# Patient Record
Sex: Male | Born: 1955 | Race: White | Hispanic: No | Marital: Married | Smoking: Former smoker
Health system: Southern US, Community
[De-identification: ages and names within clinical notes are randomized; demographics above are authoritative.]

## PROBLEM LIST (undated history)

## (undated) DIAGNOSIS — K219 Gastro-esophageal reflux disease without esophagitis: Secondary | ICD-10-CM

## (undated) DIAGNOSIS — N189 Chronic kidney disease, unspecified: Secondary | ICD-10-CM

## (undated) DIAGNOSIS — N032 Chronic nephritic syndrome with diffuse membranous glomerulonephritis: Secondary | ICD-10-CM

## (undated) DIAGNOSIS — F419 Anxiety disorder, unspecified: Secondary | ICD-10-CM

## (undated) DIAGNOSIS — I5032 Chronic diastolic (congestive) heart failure: Secondary | ICD-10-CM

## (undated) DIAGNOSIS — I77819 Aortic ectasia, unspecified site: Secondary | ICD-10-CM

## (undated) DIAGNOSIS — R05 Cough: Secondary | ICD-10-CM

## (undated) DIAGNOSIS — C801 Malignant (primary) neoplasm, unspecified: Secondary | ICD-10-CM

## (undated) DIAGNOSIS — R058 Other specified cough: Secondary | ICD-10-CM

## (undated) DIAGNOSIS — H906 Mixed conductive and sensorineural hearing loss, bilateral: Secondary | ICD-10-CM

## (undated) DIAGNOSIS — Z9289 Personal history of other medical treatment: Secondary | ICD-10-CM

## (undated) DIAGNOSIS — M109 Gout, unspecified: Secondary | ICD-10-CM

## (undated) DIAGNOSIS — K5790 Diverticulosis of intestine, part unspecified, without perforation or abscess without bleeding: Secondary | ICD-10-CM

## (undated) DIAGNOSIS — E785 Hyperlipidemia, unspecified: Secondary | ICD-10-CM

## (undated) DIAGNOSIS — K449 Diaphragmatic hernia without obstruction or gangrene: Secondary | ICD-10-CM

## (undated) DIAGNOSIS — I714 Abdominal aortic aneurysm, without rupture: Secondary | ICD-10-CM

## (undated) DIAGNOSIS — Z923 Personal history of irradiation: Secondary | ICD-10-CM

## (undated) DIAGNOSIS — I10 Essential (primary) hypertension: Secondary | ICD-10-CM

## (undated) DIAGNOSIS — G43909 Migraine, unspecified, not intractable, without status migrainosus: Secondary | ICD-10-CM

## (undated) DIAGNOSIS — I519 Heart disease, unspecified: Secondary | ICD-10-CM

## (undated) DIAGNOSIS — F1021 Alcohol dependence, in remission: Secondary | ICD-10-CM

## (undated) DIAGNOSIS — G4730 Sleep apnea, unspecified: Secondary | ICD-10-CM

## (undated) DIAGNOSIS — I2510 Atherosclerotic heart disease of native coronary artery without angina pectoris: Secondary | ICD-10-CM

## (undated) DIAGNOSIS — I739 Peripheral vascular disease, unspecified: Secondary | ICD-10-CM

## (undated) DIAGNOSIS — F341 Dysthymic disorder: Secondary | ICD-10-CM

## (undated) DIAGNOSIS — K222 Esophageal obstruction: Secondary | ICD-10-CM

## (undated) HISTORY — DX: Gout, unspecified: M10.9

## (undated) HISTORY — DX: Personal history of other medical treatment: Z92.89

## (undated) HISTORY — DX: Diverticulosis of intestine, part unspecified, without perforation or abscess without bleeding: K57.90

## (undated) HISTORY — DX: Peripheral vascular disease, unspecified: I73.9

## (undated) HISTORY — DX: Abdominal aortic aneurysm, without rupture: I71.4

## (undated) HISTORY — DX: Chronic diastolic (congestive) heart failure: I50.32

## (undated) HISTORY — PX: COLONOSCOPY: SHX174

## (undated) HISTORY — DX: Heart disease, unspecified: I51.9

## (undated) HISTORY — DX: Aortic ectasia, unspecified site: I77.819

## (undated) HISTORY — DX: Migraine, unspecified, not intractable, without status migrainosus: G43.909

## (undated) HISTORY — PX: APPENDECTOMY: SHX54

## (undated) HISTORY — DX: Atherosclerotic heart disease of native coronary artery without angina pectoris: I25.10

## (undated) HISTORY — DX: Malignant (primary) neoplasm, unspecified: C80.1

## (undated) HISTORY — DX: Anxiety disorder, unspecified: F41.9

## (undated) HISTORY — DX: Personal history of irradiation: Z92.3

## (undated) HISTORY — PX: CORONARY STENT PLACEMENT: SHX1402

---

## 1995-03-02 HISTORY — PX: BACK SURGERY: SHX140

## 1997-07-23 ENCOUNTER — Encounter: Payer: Self-pay | Admitting: Family Medicine

## 1997-07-23 LAB — CONVERTED CEMR LAB
Blood Glucose, Fasting: 94 mg/dL
RBC count: 4.92 10*6/uL
TSH: 2.09 microintl units/mL
WBC, blood: 5.8 10*3/uL

## 1997-09-26 ENCOUNTER — Encounter: Admission: RE | Admit: 1997-09-26 | Discharge: 1997-09-26 | Payer: Self-pay | Admitting: *Deleted

## 1998-08-03 ENCOUNTER — Emergency Department (HOSPITAL_COMMUNITY): Admission: EM | Admit: 1998-08-03 | Discharge: 1998-08-03 | Payer: Self-pay | Admitting: Emergency Medicine

## 1998-08-05 ENCOUNTER — Encounter: Admission: RE | Admit: 1998-08-05 | Discharge: 1998-08-05 | Payer: Self-pay | Admitting: *Deleted

## 2001-04-15 ENCOUNTER — Emergency Department (HOSPITAL_COMMUNITY): Admission: EM | Admit: 2001-04-15 | Discharge: 2001-04-15 | Payer: Self-pay | Admitting: Emergency Medicine

## 2002-08-24 ENCOUNTER — Encounter: Payer: Self-pay | Admitting: Emergency Medicine

## 2002-08-24 ENCOUNTER — Emergency Department (HOSPITAL_COMMUNITY): Admission: EM | Admit: 2002-08-24 | Discharge: 2002-08-24 | Payer: Self-pay | Admitting: Emergency Medicine

## 2002-10-10 ENCOUNTER — Encounter: Payer: Self-pay | Admitting: Family Medicine

## 2002-10-10 LAB — CONVERTED CEMR LAB: Hgb A1c MFr Bld: 5.6 %

## 2002-10-11 ENCOUNTER — Encounter: Payer: Self-pay | Admitting: Family Medicine

## 2002-10-11 LAB — CONVERTED CEMR LAB
Blood Glucose, Fasting: 98 mg/dL
RBC count: 5.44 10*6/uL
TSH: 1.67 microintl units/mL
WBC, blood: 6.9 10*3/uL

## 2002-12-16 ENCOUNTER — Ambulatory Visit (HOSPITAL_BASED_OUTPATIENT_CLINIC_OR_DEPARTMENT_OTHER): Admission: RE | Admit: 2002-12-16 | Discharge: 2002-12-16 | Payer: Self-pay | Admitting: Family Medicine

## 2003-01-30 ENCOUNTER — Encounter: Admission: RE | Admit: 2003-01-30 | Discharge: 2003-01-30 | Payer: Self-pay | Admitting: Nephrology

## 2003-02-08 ENCOUNTER — Ambulatory Visit (HOSPITAL_BASED_OUTPATIENT_CLINIC_OR_DEPARTMENT_OTHER): Admission: RE | Admit: 2003-02-08 | Discharge: 2003-02-08 | Payer: Self-pay | Admitting: Pulmonary Disease

## 2003-03-27 ENCOUNTER — Ambulatory Visit (HOSPITAL_COMMUNITY): Admission: RE | Admit: 2003-03-27 | Discharge: 2003-03-27 | Payer: Self-pay | Admitting: Nephrology

## 2003-03-29 ENCOUNTER — Observation Stay (HOSPITAL_COMMUNITY): Admission: RE | Admit: 2003-03-29 | Discharge: 2003-03-30 | Payer: Self-pay | Admitting: Nephrology

## 2003-03-29 ENCOUNTER — Encounter (INDEPENDENT_AMBULATORY_CARE_PROVIDER_SITE_OTHER): Payer: Self-pay | Admitting: *Deleted

## 2003-04-26 ENCOUNTER — Ambulatory Visit (HOSPITAL_COMMUNITY): Admission: RE | Admit: 2003-04-26 | Discharge: 2003-04-26 | Payer: Self-pay | Admitting: Internal Medicine

## 2003-05-12 ENCOUNTER — Emergency Department (HOSPITAL_COMMUNITY): Admission: EM | Admit: 2003-05-12 | Discharge: 2003-05-12 | Payer: Self-pay | Admitting: Emergency Medicine

## 2004-01-06 ENCOUNTER — Encounter: Payer: Self-pay | Admitting: Family Medicine

## 2004-01-06 LAB — CONVERTED CEMR LAB: Blood Glucose, Fasting: 87 mg/dL

## 2004-05-22 ENCOUNTER — Encounter: Payer: Self-pay | Admitting: Family Medicine

## 2004-05-22 LAB — CONVERTED CEMR LAB: Blood Glucose, Fasting: 96 mg/dL

## 2004-06-03 ENCOUNTER — Ambulatory Visit: Payer: Self-pay | Admitting: Family Medicine

## 2004-07-17 ENCOUNTER — Ambulatory Visit: Payer: Self-pay | Admitting: Family Medicine

## 2004-09-03 ENCOUNTER — Encounter: Payer: Self-pay | Admitting: Family Medicine

## 2004-09-03 LAB — CONVERTED CEMR LAB: Blood Glucose, Fasting: 95 mg/dL

## 2005-01-06 ENCOUNTER — Ambulatory Visit: Payer: Self-pay | Admitting: Family Medicine

## 2005-01-06 LAB — CONVERTED CEMR LAB
Blood Glucose, Fasting: 101 mg/dL
PSA: 0.59 ng/mL
TSH: 2.5 microintl units/mL

## 2005-01-08 ENCOUNTER — Ambulatory Visit: Payer: Self-pay | Admitting: Family Medicine

## 2005-02-11 ENCOUNTER — Ambulatory Visit: Payer: Self-pay | Admitting: Family Medicine

## 2005-07-16 ENCOUNTER — Ambulatory Visit: Payer: Self-pay | Admitting: Family Medicine

## 2006-07-23 ENCOUNTER — Emergency Department (HOSPITAL_COMMUNITY): Admission: EM | Admit: 2006-07-23 | Discharge: 2006-07-23 | Payer: Self-pay | Admitting: Emergency Medicine

## 2006-09-20 ENCOUNTER — Emergency Department (HOSPITAL_COMMUNITY): Admission: EM | Admit: 2006-09-20 | Discharge: 2006-09-20 | Payer: Self-pay | Admitting: Emergency Medicine

## 2007-05-04 ENCOUNTER — Ambulatory Visit: Payer: Self-pay | Admitting: Family Medicine

## 2007-05-10 ENCOUNTER — Encounter: Payer: Self-pay | Admitting: Family Medicine

## 2007-05-10 DIAGNOSIS — G4730 Sleep apnea, unspecified: Secondary | ICD-10-CM | POA: Insufficient documentation

## 2007-05-10 DIAGNOSIS — K219 Gastro-esophageal reflux disease without esophagitis: Secondary | ICD-10-CM

## 2007-05-10 DIAGNOSIS — N032 Chronic nephritic syndrome with diffuse membranous glomerulonephritis: Secondary | ICD-10-CM

## 2007-05-10 DIAGNOSIS — H906 Mixed conductive and sensorineural hearing loss, bilateral: Secondary | ICD-10-CM

## 2007-05-10 DIAGNOSIS — F17200 Nicotine dependence, unspecified, uncomplicated: Secondary | ICD-10-CM | POA: Insufficient documentation

## 2007-05-10 DIAGNOSIS — K222 Esophageal obstruction: Secondary | ICD-10-CM

## 2007-05-10 DIAGNOSIS — E785 Hyperlipidemia, unspecified: Secondary | ICD-10-CM

## 2007-05-10 DIAGNOSIS — K449 Diaphragmatic hernia without obstruction or gangrene: Secondary | ICD-10-CM

## 2007-05-10 DIAGNOSIS — F341 Dysthymic disorder: Secondary | ICD-10-CM

## 2007-05-10 DIAGNOSIS — I10 Essential (primary) hypertension: Secondary | ICD-10-CM | POA: Insufficient documentation

## 2007-05-10 DIAGNOSIS — K589 Irritable bowel syndrome without diarrhea: Secondary | ICD-10-CM

## 2007-05-10 DIAGNOSIS — R7309 Other abnormal glucose: Secondary | ICD-10-CM

## 2007-05-10 DIAGNOSIS — F1021 Alcohol dependence, in remission: Secondary | ICD-10-CM | POA: Insufficient documentation

## 2007-05-10 HISTORY — DX: Diaphragmatic hernia without obstruction or gangrene: K44.9

## 2007-05-10 HISTORY — DX: Mixed conductive and sensorineural hearing loss, bilateral: H90.6

## 2007-05-10 HISTORY — DX: Sleep apnea, unspecified: G47.30

## 2007-05-10 HISTORY — DX: Dysthymic disorder: F34.1

## 2007-05-10 HISTORY — DX: Hyperlipidemia, unspecified: E78.5

## 2007-05-10 HISTORY — DX: Essential (primary) hypertension: I10

## 2007-05-10 HISTORY — DX: Gastro-esophageal reflux disease without esophagitis: K21.9

## 2007-05-10 HISTORY — DX: Chronic nephritic syndrome with diffuse membranous glomerulonephritis: N03.2

## 2007-05-10 HISTORY — DX: Esophageal obstruction: K22.2

## 2007-05-15 ENCOUNTER — Ambulatory Visit: Payer: Self-pay | Admitting: Family Medicine

## 2007-05-15 DIAGNOSIS — M25539 Pain in unspecified wrist: Secondary | ICD-10-CM | POA: Insufficient documentation

## 2007-05-23 ENCOUNTER — Ambulatory Visit: Payer: Self-pay | Admitting: Family Medicine

## 2008-07-01 ENCOUNTER — Encounter: Payer: Self-pay | Admitting: Family Medicine

## 2008-08-02 ENCOUNTER — Encounter: Payer: Self-pay | Admitting: Family Medicine

## 2009-06-10 ENCOUNTER — Ambulatory Visit: Payer: Self-pay | Admitting: Family Medicine

## 2009-06-10 LAB — CONVERTED CEMR LAB
BUN: 12 mg/dL (ref 6–23)
CO2: 27 meq/L (ref 19–32)
Calcium: 9.1 mg/dL (ref 8.4–10.5)
Chloride: 105 meq/L (ref 96–112)
Creatinine, Ser: 1 mg/dL (ref 0.4–1.5)
GFR calc non Af Amer: 82.46 mL/min (ref >60)
Glucose, Bld: 89 mg/dL (ref 70–99)
Potassium: 4.5 meq/L (ref 3.5–5.1)
Sodium: 140 meq/L (ref 135–145)

## 2009-06-30 ENCOUNTER — Encounter (INDEPENDENT_AMBULATORY_CARE_PROVIDER_SITE_OTHER): Payer: Self-pay | Admitting: *Deleted

## 2009-08-28 ENCOUNTER — Encounter: Payer: Self-pay | Admitting: Family Medicine

## 2009-10-06 ENCOUNTER — Encounter (INDEPENDENT_AMBULATORY_CARE_PROVIDER_SITE_OTHER): Payer: Self-pay | Admitting: *Deleted

## 2010-02-02 ENCOUNTER — Encounter: Payer: Self-pay | Admitting: Family Medicine

## 2010-02-05 ENCOUNTER — Ambulatory Visit: Payer: Self-pay | Admitting: Family Medicine

## 2010-02-05 DIAGNOSIS — R0609 Other forms of dyspnea: Secondary | ICD-10-CM

## 2010-02-05 DIAGNOSIS — R0989 Other specified symptoms and signs involving the circulatory and respiratory systems: Secondary | ICD-10-CM

## 2010-02-19 ENCOUNTER — Ambulatory Visit: Payer: Self-pay | Admitting: Internal Medicine

## 2010-02-19 DIAGNOSIS — R635 Abnormal weight gain: Secondary | ICD-10-CM | POA: Insufficient documentation

## 2010-02-19 DIAGNOSIS — R05 Cough: Secondary | ICD-10-CM

## 2010-03-01 DIAGNOSIS — C801 Malignant (primary) neoplasm, unspecified: Secondary | ICD-10-CM

## 2010-03-01 HISTORY — DX: Malignant (primary) neoplasm, unspecified: C80.1

## 2010-03-01 HISTORY — PX: LARYNGOSCOPY / BRONCHOSCOPY / ESOPHAGOSCOPY: SUR818

## 2010-03-06 ENCOUNTER — Ambulatory Visit
Admission: RE | Admit: 2010-03-06 | Discharge: 2010-03-06 | Payer: Self-pay | Source: Home / Self Care | Attending: Internal Medicine | Admitting: Internal Medicine

## 2010-03-09 ENCOUNTER — Encounter: Payer: Self-pay | Admitting: Internal Medicine

## 2010-03-11 ENCOUNTER — Telehealth (INDEPENDENT_AMBULATORY_CARE_PROVIDER_SITE_OTHER): Payer: Self-pay | Admitting: *Deleted

## 2010-03-17 ENCOUNTER — Ambulatory Visit
Admission: RE | Admit: 2010-03-17 | Discharge: 2010-03-17 | Payer: Self-pay | Source: Home / Self Care | Attending: Internal Medicine | Admitting: Internal Medicine

## 2010-03-17 ENCOUNTER — Encounter: Payer: Self-pay | Admitting: Internal Medicine

## 2010-03-19 ENCOUNTER — Ambulatory Visit
Admission: RE | Admit: 2010-03-19 | Discharge: 2010-03-19 | Payer: Self-pay | Source: Home / Self Care | Attending: Internal Medicine | Admitting: Internal Medicine

## 2010-03-24 ENCOUNTER — Telehealth (INDEPENDENT_AMBULATORY_CARE_PROVIDER_SITE_OTHER): Payer: Self-pay | Admitting: *Deleted

## 2010-03-25 ENCOUNTER — Encounter: Payer: Self-pay | Admitting: Internal Medicine

## 2010-03-27 ENCOUNTER — Ambulatory Visit
Admission: RE | Admit: 2010-03-27 | Discharge: 2010-03-31 | Payer: Self-pay | Source: Home / Self Care | Attending: Radiation Oncology | Admitting: Radiation Oncology

## 2010-03-30 ENCOUNTER — Encounter: Payer: Self-pay | Admitting: Family Medicine

## 2010-04-01 ENCOUNTER — Other Ambulatory Visit: Payer: Self-pay | Admitting: Otolaryngology

## 2010-04-01 ENCOUNTER — Ambulatory Visit (HOSPITAL_COMMUNITY)
Admission: RE | Admit: 2010-04-01 | Discharge: 2010-04-02 | Disposition: A | Discharge status: Home / Self Care | Payer: Medicaid Other | Attending: Otolaryngology | Admitting: Otolaryngology

## 2010-04-01 ENCOUNTER — Ambulatory Visit: Payer: Self-pay | Admitting: Radiation Oncology

## 2010-04-01 DIAGNOSIS — Z01812 Encounter for preprocedural laboratory examination: Secondary | ICD-10-CM | POA: Insufficient documentation

## 2010-04-01 DIAGNOSIS — C321 Malignant neoplasm of supraglottis: Secondary | ICD-10-CM | POA: Insufficient documentation

## 2010-04-01 DIAGNOSIS — Z87891 Personal history of nicotine dependence: Secondary | ICD-10-CM | POA: Insufficient documentation

## 2010-04-01 DIAGNOSIS — Z23 Encounter for immunization: Secondary | ICD-10-CM | POA: Insufficient documentation

## 2010-04-01 DIAGNOSIS — J449 Chronic obstructive pulmonary disease, unspecified: Secondary | ICD-10-CM | POA: Insufficient documentation

## 2010-04-01 DIAGNOSIS — J4489 Other specified chronic obstructive pulmonary disease: Secondary | ICD-10-CM | POA: Insufficient documentation

## 2010-04-01 DIAGNOSIS — I10 Essential (primary) hypertension: Secondary | ICD-10-CM | POA: Insufficient documentation

## 2010-04-01 DIAGNOSIS — K219 Gastro-esophageal reflux disease without esophagitis: Secondary | ICD-10-CM | POA: Insufficient documentation

## 2010-04-01 LAB — CBC
HCT: 39.4 % (ref 39.0–52.0)
Hemoglobin: 13.6 g/dL (ref 13.0–17.0)
MCH: 31.9 pg (ref 26.0–34.0)
MCHC: 34.5 g/dL (ref 30.0–36.0)
MCV: 92.5 fL (ref 78.0–100.0)
Platelets: 369 10*3/uL (ref 150–400)
RBC: 4.26 MIL/uL (ref 4.22–5.81)
RDW: 13.2 % (ref 11.5–15.5)
WBC: 8.4 10*3/uL (ref 4.0–10.5)

## 2010-04-01 LAB — BASIC METABOLIC PANEL
BUN: 9 mg/dL (ref 6–23)
CO2: 26 mEq/L (ref 19–32)
Calcium: 9.2 mg/dL (ref 8.4–10.5)
Chloride: 106 mEq/L (ref 96–112)
Creatinine, Ser: 1 mg/dL (ref 0.4–1.5)
GFR calc Af Amer: >60 mL/min (ref >60)
GFR calc non Af Amer: >60 mL/min (ref >60)
Glucose, Bld: 107 mg/dL — ABNORMAL HIGH (ref 70–99)
Potassium: 4.1 mEq/L (ref 3.5–5.1)
Sodium: 141 mEq/L (ref 135–145)

## 2010-04-01 LAB — SURGICAL PCR SCREEN
MRSA, PCR: NEGATIVE
Staphylococcus aureus: NEGATIVE

## 2010-04-02 NOTE — Letter (Signed)
Summary: Surgical clearance/[facility]  Surgical clearance/[facility]   Imported By: Lester Goofy [place] 03/17/2010 07:13:34  _____________________________________________________________________  External Attachment:    Type:   Image     Comment:   External Document

## 2010-04-02 NOTE — Letter (Signed)
Summary: [facility]   Imported By: Lanelle Bal 09/09/2009 09:26:55  _____________________________________________________________________  External Attachment:    Type:   Image     Comment:   External Document

## 2010-04-02 NOTE — Assessment & Plan Note (Signed)
Summary: Pulmonary/ ext f/u ov for uacs   Copy to:  Dr. Hetty Ely Primary Provider/Referring Provider:  Dr. Hetty Ely  CC:  Cough- no better.  History of Present Illness: 55 yowm quit smoking Sept 2011 with no resp problems at all when quit.  February 19, 2010  1st pulmonary office eval  for hoarseness, sore throat, cough, wheeze x sev months turned down for anesthesia for wrist surgery. no excess mucus production. denies any problem while smoking.  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   denies sob  but very sedentary. Pt denies any significant   dysphagia, itching, sneezing,  nasal congestion or excess secretions,  fever, chills, sweats, unintended wt loss, pleuritic or exertional cp, hempoptysis, change in activity tolerance  orthopnea pnd or leg swelling.  Pt also denies any obvious fluctuation in symptoms with weather or environmental change or other alleviating or aggravating factors.       Current Medications (verified): 1)  Prilosec Otc 20 Mg  Tbec (Omeprazole Magnesium) .Marland Kitchen.. 1 Tablet Daily By Mouth 2)  Benicar 20 Mg  Tabs (Olmesartan Medoxomil) .... One Tablet By Mouth Daily 3)  Bystolic 10 Mg  Tabs (Nebivolol Hcl) .... One Tablet Daily  Allergies (verified): 1)  ! * Codeine 2)  ! * Iv Dye  Past History:  Past Medical History: Hyperlipidemia:(06/1997) Hypertension:(10/2002)     - Off ace February 19, 2010 due to cough/pseudowheeze Cough/ ? copd s/p smoking cessation 10/2009  Clinical Reports Reviewed:  CXR:  02/19/2010: CXR Results:  Comparison: 07/23/2006   Findings: Heart size is within normal limits.  Negative for heart failure.  Negative for infiltrate or effusion.  The lungs are clear.  Negative for mass lesion.     IMPRESSION: No acute radiographic abnormality.     Vital Signs:  Patient profile:   55 year old male Weight:      191 pounds O2 Sat:      97 % on Room air Temp:     97.5 degrees F oral Pulse rate:   60 / minute BP sitting:   106 / 70  (left arm)  Vitals Entered By: Vernie Murders (March 06, 2010 12:00 PM)  O2 Flow:  Room air  Physical Exam  Additional Exam:  wt 193 > 197 February 19, 2010 > 191 March 06, 2010  pseudowheeze resolves with purse lip maneuver still quite hoarse HEENT mild turbinate edema.  Oropharynx no thrush or excess pnd or cobblestoning.  No JVD or cervical adenopathy. Mild accessory muscle hypertrophy. Trachea midline, nl thryroid. Chest was hyperinflated by percussion with diminished breath sounds and moderate increased exp time without wheeze. Hoover sign positive at mid inspiration. Regular rate and rhythm without murmur gallop or rub or increase P2 or edema.  Abd: no hsm, nl excursion. Ext warm without cyanosis or clubbing.     Impression & Recommendations:  Problem # 1:  COUGH (ICD-786.2)  most c/w  Classic Upper airway cough syndrome, so named because it's frequently impossible to sort out how much is  CR/sinusitis with freq throat clearing (which can be related to primary GERD)   vs  causing  secondary extra esophageal GERD from wide swings in gastric pressure that  occur with throat clearing, promoting self use of mint and menthol lozenges that reduce the lower esophageal sphincter tone and exacerbate the problem further These are the same pts who not infrequently have failed to tolerate ace inhibitors,  dry powder inhalers or biphosphonates or report having reflux symptoms that don't respond to standard doses of PPI  Since stopping ace so far not helping will max rx for gerd   Orders: Est. Patient Level IV ([address])  Problem # 2:  HYPERTENSION (ICD-401.9)  His updated medication list for this problem includes:    Benicar 20 Mg Tabs (Olmesartan  medoxomil) ..... One tablet by mouth daily    Bystolic 10 Mg Tabs (Nebivolol hcl) ..... One tablet daily  over rx'd so reduce benicar to one half daily  Orders: Est. Patient Level IV ([address])  Problem # 3:  OTHER DYSPNEA AND RESPIRATORY ABNORMALITIES (ICD-786.09) ideally needs pft's but won't be covered under workmans comp  Medications Added to Medication List This Visit: 1)  Prilosec Otc 20 Mg Tbec (Omeprazole magnesium) .... Take one 30-60 min before first and last meals of the day 2)  Pepcid 20 Mg Tabs (Famotidine) .... Take one by mouth at bedtime 3)  Prednisone 10 Mg Tabs (Prednisone) .... 4 each am x 2days, 2x2days, 1x2days and stop  Patient Instructions: 1)  Benicar 20 mg take one half each am 2)  Bystolic 10 mg one daily 3)  Prilosec  30 min before bfast and supper and pepcid 20 mg at bedtime as long as coughing  or throat sore throat 4)  No cough drops 5)  Prednisone 4 each am x 2days, 2x2days, 1x2days and stop  6)  Ok for surgery next week  7)  GERD (REFLUX)  is a common cause of respiratory symptoms. It commonly presents without heartburn and can be treated with medication, but also with lifestyle changes including avoidance of late meals, excessive alcohol, smoking cessation, and avoid fatty foods, chocolate, peppermint, colas, red wine, and acidic juices such as orange juice. NO MINT OR MENTHOL PRODUCTS SO NO COUGH DROPS  8)  USE SUGARLESS CANDY INSTEAD (jolley ranchers)  9)  NO OIL BASED VITAMINS  10)  Please schedule a follow-up appointment in 4 weeks, sooner if needed  11)  late add needs pft's on return. Prescriptions: PREDNISONE 10 MG  TABS (PREDNISONE) 4 each am x 2days, 2x2days, 1x2days and stop  #14 x 0   Entered and Authorized by:   Nyoka Cowden MD   Signed by:   Nyoka Cowden MD on 03/06/2010   Method used:   Electronically to        [facility]  [address] [phone] (retail)       [address]       [place], [place]  [address]       Ph:  [phone]       Fax: [phone]   RxID:   [id number]

## 2010-04-02 NOTE — Miscellaneous (Signed)
Summary: Orders Update pft charges  Clinical Lists Changes  Orders: Added new Service order of Carbon Monoxide diffusing w/capacity (94720) - Signed Added new Service order of Lung Volumes (94240) - Signed Added new Service order of Spirometry (Pre & Post) (94060) - Signed 

## 2010-04-02 NOTE — Assessment & Plan Note (Signed)
Summary: sore throat/dlo   Vital Signs:  Patient profile:   55 year old male Weight:      193.75 pounds Temp:     98.8 degrees F oral Pulse rate:   64 / minute Pulse rhythm:   regular BP sitting:   124 / 88  (left arm) Cuff size:   large  Vitals Entered By: Sydell Axon LPN (February 05, 2010 12:24 PM) CC: Sore throat since August, feels like he has to cough all of the time because of a itchy feeling in the back of his throat. Surgery had to be cancelled because of this.   History of Present Illness: Pt here for chronic cough with raspiness to his voice. He has been on Ramipril almost 21/2 years with cough since this summer, worst at night with laying down in bed.  The  voice change is new in my opinion and when questioned discussed an episode where he was to have wrist surgery and had problems on the table with a hypotensive episode and subsequent desaturation. The surgery was not performed and he now needs clearance as well for surgery. He feels well but his cough is significantly more pronounced and his voice is definitely altered today. His cough is nonproductive and he denies fever or chills, headache, rhinitis. He does have a mildly sore throat. He had initially been placed on ACEI due to the combination of HBP and Glucose Intolerance.  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 1 Daily By Mouth 2)  Prilosec Otc  20 Mg  Tbec (Omeprazole Magnesium) .Marland Kitchen.. 1 Tablet Daily By Mouth 3)  Carvedilol 12.5 Mg  Tabs (Carvedilol) .... One Tab By Mouth Two Times A Day  Allergies: 1)  ! * Codeine 2)  ! * Iv Dye  Physical Exam  General:  Well-developed,well-nourished,in no acute distress; alert,appropriate and cooperative throughout examination Head:  Normocephalic and atraumatic without obvious abnormalities. No apparent alopecia or balding. Eyes:  Conjunctiva clear bilaterally.  Lungs:  Normal respiratory effort, chest expands symmetrically. Lungs are clear to auscultation, no crackles or wheezes. Heart:  Normal rate and regular rhythm. S1 and S2 normal without gallop, murmur, click, rub or other extra sounds.   Impression & Recommendations:  Problem # 1:  OTHER DYSPNEA AND RESPIRATORY ABNORMALITIES (ICD-786.09) Will hold off stopping ACEI until seen by Pulm to give true baseline of pt. His updated medication list for this problem includes:    Ramipril 10 Mg Caps (Ramipril) .Marland Kitchen... 1 daily by mouth    Carvedilol 12.5 Mg Tabs (Carvedilol) ..... One tab by mouth two times a day  Orders: Pulmonary Referral (Pulmonary)  Complete Medication List: 1)  Ramipril 10 Mg Caps (Ramipril) .Marland Kitchen.. 1 daily by mouth 2)  Prilosec Otc 20 Mg Tbec (Omeprazole magnesium) .Marland Kitchen.. 1 tablet daily by mouth 3)  Carvedilol 12.5 Mg Tabs (Carvedilol) .... One tab by mouth two times a day  Patient Instructions: 1)  Refer to Pulm for eval.   Orders Added: 1)  Pulmonary Referral [Pulmonary] 2)  Est. Patient Level II [[address]]    Current Allergies (reviewed today): ! * CODEINE ! * IV DYE

## 2010-04-02 NOTE — Letter (Signed)
Summary: Colonoscopy Letter  [facility]  [address] [place], [place] [address]   Phone: [phone]  Fax: [phone]      Jun 30, 2009 MRN: [id number]   Tony Hunt [address] [place], [place]  [address]   Dear [name],   According to your medical record, it is time for you to schedule a Colonoscopy. The American Cancer Society recommends this procedure as a method to detect early colon cancer. Patients with a family history of colon cancer, or a personal history of colon polyps or inflammatory bowel disease are at increased risk.  This letter has beeen generated based on the recommendations made at the time of your procedure. If you feel that in your particular situation this may no longer apply, please contact our office.  Please call our office at [phone] to schedule this appointment or to update your records at your earliest convenience.  Thank you for cooperating with [place] to provide you with the very best care possible.   Sincerely,    Wilhemina Bonito. Marina Goodell, M.D.  [facility] [phone]

## 2010-04-02 NOTE — Letter (Signed)
Summary: Nadara Eaton letter  [facility]  [address] [place], [place] [address]   Phone: [phone]  Fax: [phone]       10/06/2009 MRN: [id number]  KEARY WATERSON [address] [place], [place]  [address]  Dear Mr. [facility] and [facility] announce the retirement of Arta Silence, M.D., from full-time practice at the [facility] office effective August 28, 2009 and his plans of returning part-time.  It is important to Dr. Hetty Ely and to our practice that you understand that [facility] has seven physicians in our office for your health care needs.  We will continue to offer the same exceptional care that you have today.    Dr. Hetty Ely has spoken to many of you about his plans for retirement and returning part-time in the fall.   We will continue to work with you through the transition to schedule appointments for you in the office and meet the high standards that [facility] is committed to.   Again, it is with great pleasure that we share the news that Dr. Hetty Ely will return to [facility] in October of 2011 with a reduced schedule.    If you have any questions, or would like to request an appointment with one of our physicians, please call us at [phone] and press the option for Scheduling an appointment.  We take pleasure in providing you with excellent patient care and look forward to seeing you at your next office visit.  Our [facility] Physicians are:  Tillman Abide, M.D. Laurita Quint, M.D. Roxy Manns, M.D. Kerby Nora, M.D. Hannah Beat, M.D. Ruthe Mannan, M.D. We proudly welcomed Raechel Ache, M.D. and Eustaquio Boyden, M.D. to the practice in July/August 2011.  Sincerely,  [facility]

## 2010-04-02 NOTE — Assessment & Plan Note (Signed)
Summary: Pulmonary/ ext summary f/u ov   Copy to:  Dr. Hetty Ely Primary Provider/Referring Provider:  Dr. Hetty Ely  CC:  Cough- some better.  History of Present Illness: 4 yowm quit smoking Sept 2011 with no resp problems at all when quit.  February 19, 2010  1st pulmonary office eval  for hoarseness, sore throat, cough, wheeze x sev months turned down for anesthesia for wrist surgery. no excess mucus production. denies any problem while smoking.  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   denies sob  but very sedentary.  rec Benicar 20 mg take one half each am Bystolic 10 mg one daily Prilosec  30 min before bfast and supper and pepcid 20 mg at bedtime as long as coughing  or throat sore throat No cough drops Prednisone 4 each am x 2days, 2x2days, 1x2days and stop  Ok for surgery next week  GERD (REFLUX)  diet  March 19, 2010 ov co better but still hoarse and sore throat, much better after prednisone and surgery is scheduled for Jan 25.  no excess mucus production.  Pt denies any significant  dysphagia, itching, sneezing,  nasal congestion or excess secretions,  fever, chills, sweats, unintended wt loss, pleuritic or exertional cp, hempoptysis, change in activity tolerance  orthopnea pnd or leg swelling. Pt also denies any obvious fluctuation in symptoms with weather or environmental change or other alleviating or aggravating factors.       Current Medications (verified): 1)  Prilosec Otc 20 Mg  Tbec (Omeprazole Magnesium) .... Take One 30-60 Min Before First and Last Meals of The Day 2)  Benicar 20 Mg  Tabs (Olmesartan Medoxomil) .... One Tablet By Mouth Daily 3)  Bystolic 10 Mg  Tabs (Nebivolol Hcl) .... One Tablet Daily 4)  Pepcid 20 Mg Tabs  (Famotidine) .... Take One By Mouth At Bedtime  Allergies (verified): 1)  ! * Codeine 2)  ! * Iv Dye  Past History:  Past Medical History: Hyperlipidemia:(06/1997) Hypertension:(10/2002)     - Off ace February 19, 2010 due to cough/pseudowheeze Cough/ ? copd s/p smoking cessation 10/2009       - PFT's wnl March 19, 2010   Vital Signs:  Patient profile:   55 year old male Weight:      198 pounds BMI:     31.12 O2 Sat:      95 % on Room air Temp:     98.0 degrees F oral Pulse rate:   76 / minute BP sitting:   130 / 80  (left arm) Cuff size:   large  Vitals Entered By: Vernie Murders (March 19, 2010 10:50 AM)  O2 Flow:  Room air  Physical Exam  Additional Exam:  wt 193 > 197 February 19, 2010 > 191 March 06, 2010  > 198 March 19, 2010  pseudowheeze resolves with purse lip maneuver minimally  hoarse HEENT mild turbinate edema.  Oropharynx no thrush or excess pnd or cobblestoning.  No JVD or cervical adenopathy. Mild accessory muscle hypertrophy. Trachea midline, nl thryroid. Chest was hyperinflated by percussion with diminished breath sounds and moderate increased exp time without wheeze. Hoover sign positive at mid inspiration. Regular rate and rhythm without murmur  gallop or rub or increase P2 or edema.  Abd: no hsm, nl excursion. Ext warm without cyanosis or clubbing.     Impression & Recommendations:  Problem # 1:  COUGH (ICD-786.2)  most c/w  Classic Upper airway cough syndrome, so named because it's frequently impossible to sort out how much is  CR/sinusitis with freq throat clearing (which can be related to primary GERD)   vs  causing  secondary extra esophageal GERD from wide swings in gastric pressure that occur with throat clearing, promoting self use of mint and menthol lozenges that reduce the lower esophageal sphincter tone and exacerbate the problem further These are the same pts who not infrequently have failed to tolerate ace inhibitors,  dry powder inhalers  or biphosphonates or report having reflux symptoms that don't respond to standard doses of PPI  Since stopping ace so far not helping   max rx for gerd still hoarse and sob needs ent eval next  pft's nl so ok to proceed with wrist surgery regarless of hoarsenss which can be addressed later  Orders: Est. Patient Level IV ([address])  Problem # 2:  HYPERTENSION (ICD-401.9)  His updated medication list for this problem includes:    Benicar 20 Mg Tabs (Olmesartan medoxomil) ..... One tablet by mouth daily    Bystolic 10 Mg Tabs (Nebivolol hcl) ..... One tablet daily or take coreg  Avoid ace and non-specific BB here, but especially ace.  Orders: Est. Patient Level IV ([address])  Medications Added to Medication List This Visit: 1)  Bystolic 10 Mg Tabs (Nebivolol hcl) .... One tablet daily or take coreg  Patient Instructions: 1)  You can call Almyra Free at [phone] when ready to be referred to a throat doctor 2)  See Dr Serita Butcher for longterm blood pressure management but I would  ace inhitibors completely 3)  You are cleared for surgery, your lung function is normal

## 2010-04-02 NOTE — Progress Notes (Signed)
Summary: surgical clearance  Phone Note From Other Clinic   Caller: dr Orlan Leavens [phone] karen Call For: wert Summary of Call: was looking to see if he was cleared for surgery fax  [phone] Initial call taken by: Lacinda Axon,  March 11, 2010 8:26 AM  Follow-up for Phone Call        Spoke with Clydie Braun.  She states that pt is due to resched his wrist surgery soon and needs pulm clearance.  He is sched to have PFTs and rov with MW on 04/02/09- pls advise if he is okay for this surgery, or do we need to wait for PFT's.  Thank! Follow-up by: Vernie Murders,  March 11, 2010 10:02 AM  Additional Follow-up for Phone Call Additional follow up Details #1::        yes, see #6 on last ov  instruction. Additional Follow-up by: Nyoka Cowden MD,  March 11, 2010 12:56 PM    Additional Follow-up for Phone Call Additional follow up Details #2::    Left message on Karen's voicemail that I have faxed teh surgery clearance given by MW at last OV to given fax number and if any other questions or concerns please call the office.Reynaldo Minium CMA  March 11, 2010 1:54 PM
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
## 2010-04-02 NOTE — Progress Notes (Signed)
Summary: told to call and sched with lib an appt   Phone Note Call from Patient Call back at Home Phone [phone]   Caller: Patient Call For: wert Summary of Call: pt was told to call libby to be scheduled with a throat doctor Initial call taken by: Lacinda Axon,  March 24, 2010 12:03 PM  Follow-up for Phone Call        appt to see dr Lazarus Salines @[place]  ent 03/25/10@9 :30am pt is aware of this appt  Follow-up by: Oneita Jolly,  March 24, 2010 12:39 PM

## 2010-04-02 NOTE — Letter (Signed)
Summary: Physician's Progress Note,Dr.Fred Ortmann,[facility]  Physician's Progress Note,Dr.Fred [facility]   Imported By: Beau Fanny 02/05/2010 14:57:45  _____________________________________________________________________  External Attachment:    Type:   Image     Comment:   External Document
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
## 2010-04-08 NOTE — Consult Note (Addendum)
Summary: Flo Shanks MD/[facility]  Flo Shanks MD/[facility]   Imported By: Lester Navajo 04/03/2010 09:38:35  _____________________________________________________________________  External Attachment:    Type:   Image     Comment:   External Document

## 2010-04-14 ENCOUNTER — Encounter: Payer: Self-pay | Admitting: Internal Medicine

## 2010-04-15 ENCOUNTER — Other Ambulatory Visit (HOSPITAL_COMMUNITY): Payer: Self-pay | Admitting: Otolaryngology

## 2010-04-15 DIAGNOSIS — C329 Malignant neoplasm of larynx, unspecified: Secondary | ICD-10-CM

## 2010-04-15 NOTE — Op Note (Signed)
NAMEMarland Hunt  SKY, BORBOA NO.:  [id number]  MEDICAL RECORD NO.:  [id number]           PATIENT TYPE:  I  LOCATION:  3302                         FACILITY:  [facility]  PHYSICIAN:  Zola Button T. Lazarus Salines, M.D. DATE OF BIRTH:  August 19, 1955  DATE OF PROCEDURE:  04/01/2010 DATE OF DISCHARGE:                              OPERATIVE REPORT   PREOPERATIVE DIAGNOSIS:  Left false vocal cord lesion.  POSTOPERATIVE DIAGNOSIS:  T3N0 left transglottic laryngeal lesion.  PROCEDURE PERFORMED: 1. Direct laryngoscopy with biopsies. 2. Bronchoscopy. 3. Esophagoscopy.  SURGEON:  Tony Hunt. Julus Kelley, MD  ANESTHESIA:  General mask converted to general orotracheal.  ESTIMATED BLOOD LOSS:  Minimal.  COMPLICATIONS:  None.  FINDINGS:  A plaque-like hyperkeratotic lesion on the posterior false local cord extending down onto the true cord back onto the arytenoid and to the midline of the posterior commissure.  Some limited mobility of the left vocal cord.  Anterior false and true cords, aryepiglottic folds, epiglottis, and the entire right larynx all normal to appearance. No adenopathy.  No other lesions identified.  Biopsies taken from the midline posterior commissure, left arytenoid, left posterior true cord, and left posterior false cord.  PROCEDURE IN DETAIL:  With the patient in a comfortable supine position, general mask anesthesia was administered.  At an appropriate level, the table was turned 90 degrees and the patient placed in a slight reverse Trendelenburg.  A clean preparation and draping was accomplished in the standard fashion.  A rubber tooth guard was placed.  Taking care to protect lips and teeth, the Jackson laryngoscope was introduced and with some difficulty poised into the supraglottis.  The lesion was identified as above but I could not get an adequate angulation to get a good picture.  The laryngoscope was removed several times to allow additional mask ventilation  when saturations started to drop.  The 0-degree bronchoscopic telescope was inserted through the [facility] laryngoscope through the glottis and inspection down into the mainstem bronchus on each side was accomplished with no significant findings. The telescope was removed.  The slider was removed from the [facility] laryngoscope and a 6.5 endotracheal tube was placed without difficulty.  Ventilation was assumed per orotracheal tube.  The tube was swept to the left side for a right-sided access to the mouth and pharynx.  At this point, the Dedo laryngoscope was introduced and passed into the supraglottis.  The lesion was identified.  Using the tip of the laryngoscope, the endotracheal tube was displaced forward and better visualization of the posterior extent of the tumor was possible.  Small cup forceps biopsies were taken from the midline of posterior commissure where there was apparent visible lesion, from the left arytenoid, from the posterior left true vocal cord, and the posterior left false focal cord which were labeled separately and sent for permanent interpretation.  Hemostasis was spontaneous.  Note, that an adrenaline moistened pledget had been used prior to beginning the biopsies.  At this point, the laryngoscope was used to fully inspect the entire larynx including valleculae, piriform sinuses, and postcricoid larynx. The laryngoscope was removed.  The cervical esophagoscope was  lubricated and passed to its full length without difficulty with no significant findings.  The esophagoscope was removed.  The entire oral cavity, nasopharynx, oropharynx, and hypopharynx were palpated to the extent that I could reach down with my finger.  He has small residual fibrotic tonsils.  Neck palpation revealed no adenopathy.  At this point, the Dedo laryngoscope was reintroduced and poised in the supraglottis.  Hemostasis was observed.  The patient was extubated and working through the  Dedo laryngoscope, the telescope was placed and a photograph was taken of the posterior glottis for later orientation purposes.  Small amount of blood was suctioned clear.  The telescope was removed.  The laryngoscope was removed and replaced with a Jackson laryngoscope.  The patient was reintubated with a #7 endotracheal tube and ventilation was continued per endotracheal tube.  At this point, the procedure was completed.  The lips were mildly bruised but the teeth were intact.  Hemostasis was observed.  The patient was returned to Anesthesia, awakened, extubated, and transferred to recovery in stable condition.  COMMENTS:  This is a 55 year old white male with a recent history of progressive hoarseness and apparent exophytic lesion of the false vocal cord identified in the office was the indication for today's procedure. CT scan suggested involvement of the true vocal cord which was corroborated by today's procedure.  Based on involvement of the true cord, ventricle, arytenoid, and to the posterior commissure, this is a T3 lesion (there was some limited mobility of the cord).  Given the involvement of the posterior commissure, I think he will be best treated with radiation therapy rather than a partial laryngeal surgery.  He will be observed 23 hours in extended recovery given his history of sleep apnea.     Tony Hunt. Lazarus Salines, M.D.     KTW/MEDQ  D:  04/01/2010  T:  04/02/2010  Job:  [id number]  cc:   Billie Lade, M.D. Charlaine Dalton. Sherene Sires, MD, [facility]  Electronically Signed by Flo Shanks M.D. on 04/15/2010 05:40:02 PM

## 2010-04-16 NOTE — Letter (Signed)
Summary: [facility] Consultation Note  [facility] Consultation Note   Imported By: Kassie Mends 04/07/2010 11:44:19  _____________________________________________________________________  External Attachment:    Type:   Image     Comment:   External Document

## 2010-04-23 ENCOUNTER — Encounter: Payer: Self-pay | Admitting: Internal Medicine

## 2010-04-23 ENCOUNTER — Ambulatory Visit: Payer: Medicaid Other | Attending: Radiation Oncology | Admitting: Radiation Oncology

## 2010-04-23 DIAGNOSIS — C321 Malignant neoplasm of supraglottis: Secondary | ICD-10-CM | POA: Insufficient documentation

## 2010-04-23 DIAGNOSIS — Y842 Radiological procedure and radiotherapy as the cause of abnormal reaction of the patient, or of later complication, without mention of misadventure at the time of the procedure: Secondary | ICD-10-CM | POA: Insufficient documentation

## 2010-04-23 DIAGNOSIS — R110 Nausea: Secondary | ICD-10-CM | POA: Insufficient documentation

## 2010-04-23 DIAGNOSIS — R1310 Dysphagia, unspecified: Secondary | ICD-10-CM | POA: Insufficient documentation

## 2010-04-23 DIAGNOSIS — F411 Generalized anxiety disorder: Secondary | ICD-10-CM | POA: Insufficient documentation

## 2010-04-27 ENCOUNTER — Other Ambulatory Visit (HOSPITAL_COMMUNITY): Payer: Self-pay | Admitting: Dentistry

## 2010-04-27 ENCOUNTER — Encounter (HOSPITAL_COMMUNITY)
Admission: RE | Admit: 2010-04-27 | Discharge: 2010-04-27 | Disposition: A | Discharge status: Home / Self Care | Payer: Medicaid Other | Source: Ambulatory Visit | Attending: Otolaryngology | Admitting: Otolaryngology

## 2010-04-27 DIAGNOSIS — K0530 Chronic periodontitis, unspecified: Secondary | ICD-10-CM

## 2010-04-27 DIAGNOSIS — K036 Deposits [accretions] on teeth: Secondary | ICD-10-CM

## 2010-04-27 DIAGNOSIS — C329 Malignant neoplasm of larynx, unspecified: Secondary | ICD-10-CM | POA: Insufficient documentation

## 2010-04-27 DIAGNOSIS — K089 Disorder of teeth and supporting structures, unspecified: Secondary | ICD-10-CM

## 2010-04-27 LAB — GLUCOSE, CAPILLARY: Glucose-Capillary: 116 mg/dL — ABNORMAL HIGH (ref 70–99)

## 2010-04-28 NOTE — Consult Note (Signed)
Summary: [facility]  [facility]   Imported By: Sherian Rein 04/20/2010 08:43:48  _____________________________________________________________________  External Attachment:    Type:   Image     Comment:   External Document

## 2010-05-05 ENCOUNTER — Ambulatory Visit: Payer: Medicaid Other | Attending: Radiation Oncology | Admitting: Radiation Oncology

## 2010-05-05 DIAGNOSIS — Z0189 Encounter for other specified special examinations: Secondary | ICD-10-CM

## 2010-05-05 DIAGNOSIS — K08109 Complete loss of teeth, unspecified cause, unspecified class: Secondary | ICD-10-CM

## 2010-05-05 DIAGNOSIS — C329 Malignant neoplasm of larynx, unspecified: Secondary | ICD-10-CM | POA: Insufficient documentation

## 2010-05-05 DIAGNOSIS — K08409 Partial loss of teeth, unspecified cause, unspecified class: Secondary | ICD-10-CM

## 2010-05-05 LAB — CREATININE, SERUM: Creatinine, Ser: 1.1 mg/dL (ref 0.40–1.50)

## 2010-05-05 LAB — BUN: BUN: 16 mg/dL (ref 6–23)

## 2010-05-07 NOTE — Letter (Signed)
Summary: Antony Blackbird MD/[facility]  Antony Blackbird MD/[facility]   Imported By: Lester Beloit 04/29/2010 09:13:28  _____________________________________________________________________  External Attachment:    Type:   Image     Comment:   External Document

## 2010-06-02 DIAGNOSIS — K121 Other forms of stomatitis: Secondary | ICD-10-CM

## 2010-06-02 DIAGNOSIS — R1310 Dysphagia, unspecified: Secondary | ICD-10-CM

## 2010-06-02 DIAGNOSIS — K117 Disturbances of salivary secretion: Secondary | ICD-10-CM

## 2010-06-02 DIAGNOSIS — K1230 Oral mucositis (ulcerative), unspecified: Secondary | ICD-10-CM

## 2010-06-30 ENCOUNTER — Ambulatory Visit: Payer: Medicaid Other | Attending: Radiation Oncology | Admitting: Radiation Oncology

## 2010-06-30 ENCOUNTER — Other Ambulatory Visit: Payer: Self-pay | Admitting: Radiation Oncology

## 2010-06-30 ENCOUNTER — Ambulatory Visit (HOSPITAL_COMMUNITY): Payer: Medicaid Other | Attending: Radiation Oncology

## 2010-06-30 DIAGNOSIS — C329 Malignant neoplasm of larynx, unspecified: Secondary | ICD-10-CM | POA: Insufficient documentation

## 2010-06-30 LAB — CBC WITH DIFFERENTIAL/PLATELET
Basophils Absolute: 0 10*3/uL (ref 0.0–0.1)
Eosinophils Absolute: 0.1 10*3/uL (ref 0.0–0.5)
HGB: 12.9 g/dL — ABNORMAL LOW (ref 13.0–17.1)
LYMPH%: 10.4 % — ABNORMAL LOW (ref 14.0–49.0)
MCV: 92.4 fL (ref 79.3–98.0)
MONO#: 0.5 10*3/uL (ref 0.1–0.9)
MONO%: 8.2 % (ref 0.0–14.0)
NEUT#: 4.4 10*3/uL (ref 1.5–6.5)
Platelets: 303 10*3/uL (ref 140–400)
RBC: 4.07 10*6/uL — ABNORMAL LOW (ref 4.20–5.82)
RDW: 13.3 % (ref 11.0–14.6)
WBC: 5.6 10*3/uL (ref 4.0–10.3)

## 2010-06-30 LAB — BASIC METABOLIC PANEL
BUN: 32 mg/dL — ABNORMAL HIGH (ref 6–23)
Chloride: 104 mEq/L (ref 96–112)
Potassium: 5.3 mEq/L (ref 3.5–5.3)

## 2010-07-01 ENCOUNTER — Encounter (HOSPITAL_BASED_OUTPATIENT_CLINIC_OR_DEPARTMENT_OTHER): Payer: Medicaid Other | Admitting: Oncology

## 2010-07-01 DIAGNOSIS — C321 Malignant neoplasm of supraglottis: Secondary | ICD-10-CM

## 2010-07-01 DIAGNOSIS — C329 Malignant neoplasm of larynx, unspecified: Secondary | ICD-10-CM

## 2010-07-02 ENCOUNTER — Encounter (HOSPITAL_BASED_OUTPATIENT_CLINIC_OR_DEPARTMENT_OTHER): Payer: Medicaid Other | Admitting: Oncology

## 2010-07-02 DIAGNOSIS — C329 Malignant neoplasm of larynx, unspecified: Secondary | ICD-10-CM

## 2010-07-02 DIAGNOSIS — R110 Nausea: Secondary | ICD-10-CM

## 2010-07-03 ENCOUNTER — Encounter (HOSPITAL_BASED_OUTPATIENT_CLINIC_OR_DEPARTMENT_OTHER): Payer: Medicaid Other | Admitting: Oncology

## 2010-07-03 DIAGNOSIS — R1319 Other dysphagia: Secondary | ICD-10-CM

## 2010-07-03 DIAGNOSIS — R110 Nausea: Secondary | ICD-10-CM

## 2010-07-03 DIAGNOSIS — C329 Malignant neoplasm of larynx, unspecified: Secondary | ICD-10-CM

## 2010-07-06 ENCOUNTER — Other Ambulatory Visit: Payer: Self-pay | Admitting: Radiation Oncology

## 2010-07-06 ENCOUNTER — Encounter (HOSPITAL_BASED_OUTPATIENT_CLINIC_OR_DEPARTMENT_OTHER): Payer: Medicaid Other | Admitting: Oncology

## 2010-07-06 DIAGNOSIS — R110 Nausea: Secondary | ICD-10-CM

## 2010-07-06 DIAGNOSIS — C329 Malignant neoplasm of larynx, unspecified: Secondary | ICD-10-CM

## 2010-07-06 DIAGNOSIS — R1319 Other dysphagia: Secondary | ICD-10-CM

## 2010-07-06 LAB — BASIC METABOLIC PANEL
BUN: 17 mg/dL (ref 6–23)
CO2: 18 mEq/L — ABNORMAL LOW (ref 19–32)
Calcium: 8.7 mg/dL (ref 8.4–10.5)
Chloride: 107 mEq/L (ref 96–112)
Creatinine, Ser: 1.21 mg/dL (ref 0.40–1.50)
Glucose, Bld: 239 mg/dL — ABNORMAL HIGH (ref 70–99)

## 2010-07-06 LAB — CBC WITH DIFFERENTIAL/PLATELET
Eosinophils Absolute: 0.2 10*3/uL (ref 0.0–0.5)
HCT: 36.5 % — ABNORMAL LOW (ref 38.4–49.9)
LYMPH%: 17.2 % (ref 14.0–49.0)
MCV: 89.7 fL (ref 79.3–98.0)
MONO#: 0.4 10*3/uL (ref 0.1–0.9)
MONO%: 9 % (ref 0.0–14.0)
NEUT#: 2.8 10*3/uL (ref 1.5–6.5)
NEUT%: 68.8 % (ref 39.0–75.0)
Platelets: 236 10*3/uL (ref 140–400)
RBC: 4.07 10*6/uL — ABNORMAL LOW (ref 4.20–5.82)
WBC: 4 10*3/uL (ref 4.0–10.3)

## 2010-07-08 ENCOUNTER — Other Ambulatory Visit: Payer: Self-pay | Admitting: Radiation Oncology

## 2010-07-08 ENCOUNTER — Encounter (HOSPITAL_BASED_OUTPATIENT_CLINIC_OR_DEPARTMENT_OTHER): Payer: Medicaid Other | Admitting: Oncology

## 2010-07-08 DIAGNOSIS — R1310 Dysphagia, unspecified: Secondary | ICD-10-CM

## 2010-07-08 DIAGNOSIS — R110 Nausea: Secondary | ICD-10-CM

## 2010-07-08 DIAGNOSIS — R070 Pain in throat: Secondary | ICD-10-CM

## 2010-07-08 DIAGNOSIS — C329 Malignant neoplasm of larynx, unspecified: Secondary | ICD-10-CM

## 2010-07-08 LAB — BASIC METABOLIC PANEL
BUN: 14 mg/dL (ref 6–23)
Calcium: 9.3 mg/dL (ref 8.4–10.5)
Chloride: 106 mEq/L (ref 96–112)
Creatinine, Ser: 1.32 mg/dL (ref 0.40–1.50)

## 2010-07-10 ENCOUNTER — Encounter (HOSPITAL_BASED_OUTPATIENT_CLINIC_OR_DEPARTMENT_OTHER): Payer: Medicaid Other | Admitting: Oncology

## 2010-07-10 DIAGNOSIS — C329 Malignant neoplasm of larynx, unspecified: Secondary | ICD-10-CM

## 2010-07-10 DIAGNOSIS — R112 Nausea with vomiting, unspecified: Secondary | ICD-10-CM

## 2010-07-10 DIAGNOSIS — R070 Pain in throat: Secondary | ICD-10-CM

## 2010-07-13 ENCOUNTER — Ambulatory Visit: Payer: Medicaid Other | Attending: Radiation Oncology | Admitting: Radiation Oncology

## 2010-07-13 ENCOUNTER — Ambulatory Visit: Payer: Self-pay | Admitting: Radiation Oncology

## 2010-08-03 DIAGNOSIS — K117 Disturbances of salivary secretion: Secondary | ICD-10-CM

## 2010-08-03 DIAGNOSIS — K0850 Unsatisfactory restoration of tooth, unspecified: Secondary | ICD-10-CM

## 2010-08-03 DIAGNOSIS — R439 Unspecified disturbances of smell and taste: Secondary | ICD-10-CM

## 2010-08-04 ENCOUNTER — Other Ambulatory Visit (HOSPITAL_COMMUNITY): Payer: Self-pay | Admitting: Dentistry

## 2010-08-17 ENCOUNTER — Ambulatory Visit
Admission: RE | Admit: 2010-08-17 | Discharge: 2010-08-17 | Disposition: A | Discharge status: Home / Self Care | Payer: Worker's Compensation | Source: Ambulatory Visit | Attending: Radiation Oncology | Admitting: Radiation Oncology

## 2010-08-20 ENCOUNTER — Encounter (HOSPITAL_COMMUNITY)
Admission: RE | Admit: 2010-08-20 | Discharge: 2010-08-20 | Disposition: A | Discharge status: Home / Self Care | Payer: Worker's Compensation | Source: Ambulatory Visit | Attending: Orthopedic Surgery | Admitting: Orthopedic Surgery

## 2010-08-20 ENCOUNTER — Other Ambulatory Visit (HOSPITAL_COMMUNITY): Payer: Worker's Compensation

## 2010-08-20 LAB — SURGICAL PCR SCREEN
MRSA, PCR: NEGATIVE
Staphylococcus aureus: NEGATIVE

## 2010-08-20 LAB — BASIC METABOLIC PANEL
CO2: 28 mEq/L (ref 19–32)
Calcium: 9.9 mg/dL (ref 8.4–10.5)
Potassium: 4.9 mEq/L (ref 3.5–5.1)
Sodium: 137 mEq/L (ref 135–145)

## 2010-08-20 LAB — CBC
Hemoglobin: 13.6 g/dL (ref 13.0–17.0)
MCH: 31.1 pg (ref 26.0–34.0)
Platelets: 302 10*3/uL (ref 150–400)
RBC: 4.37 MIL/uL (ref 4.22–5.81)
WBC: 5.2 10*3/uL (ref 4.0–10.5)

## 2010-08-21 HISTORY — PX: WRIST FRACTURE SURGERY: SHX121

## 2010-08-24 ENCOUNTER — Inpatient Hospital Stay (HOSPITAL_COMMUNITY)
Admission: RE | Admit: 2010-08-24 | Discharge: 2010-08-25 | DRG: 497 | Disposition: A | Discharge status: Home / Self Care | Payer: Worker's Compensation | Source: Ambulatory Visit | Attending: Orthopedic Surgery | Admitting: Orthopedic Surgery

## 2010-08-24 DIAGNOSIS — I10 Essential (primary) hypertension: Secondary | ICD-10-CM | POA: Diagnosis present

## 2010-08-24 DIAGNOSIS — Y832 Surgical operation with anastomosis, bypass or graft as the cause of abnormal reaction of the patient, or of later complication, without mention of misadventure at the time of the procedure: Secondary | ICD-10-CM | POA: Diagnosis present

## 2010-08-24 DIAGNOSIS — T8489XA Other specified complication of internal orthopedic prosthetic devices, implants and grafts, initial encounter: Principal | ICD-10-CM | POA: Diagnosis present

## 2010-08-29 NOTE — Op Note (Signed)
NAMEMarland Kitchen  Tony Hunt, Tony Hunt NO.:  [id number]  MEDICAL RECORD NO.:  [id number]  LOCATION:  5019                         FACILITY:  [facility]  PHYSICIAN:  Madelynn Done, MD  DATE OF BIRTH:  1955-12-19  DATE OF PROCEDURE:  08/24/2010 DATE OF DISCHARGE:                              OPERATIVE REPORT   PREOPERATIVE DIAGNOSIS:  Right wrist four-corner arthrodesis nonunion.  POSTOPERATIVE DIAGNOSIS:  Right wrist four-corner arthrodesis nonunion.  ANESTHESIA:  General via LMA with axillary block.  ATTENDING SURGEON:  Sharma Covert IV, MD who scrubbed and present for the entire procedure.  ASSISTANT SURGEON:  Jaquelyn Bitter. Chabon, PA-C who scrubbed and present for the entire procedure who was needed for the takedown of the removal of the hardware, placement of the internal fixation and harvesting of the iliac crest bone graft.  SURGICAL PROCEDURES: 1. Right wrist removal of deep implants, plates and screws. 2. Right wrist revision four-corner arthrodesis with iliac crest bone     grafting. 3. Radiograph, 3 views of right wrist.  SURGICAL IMPLANT:  Six 0.045 K-wires.  SURGICAL INDICATIONS:  Mr. Coopman is a right-hand dominant who underwent four-quadrant arthrodesis in October 2010.  The patient continued to have discomfort in his wrist, and the patient was noted to have a incomplete union of his arthrodesis site.  Risks, benefits, and alternative were discussed in detail with the patient and signed informed consent was obtained.  Risks include, but not limited to bleeding, infection, damage to nearby nerves, arteries or tendons, nonunion, malunion, hardware failure, loss of motion of the wrist and digits and need for further surgical intervention.  DESCRIPTION OF PROCEDURE:  The patient was appropriately identified in the preop holding area, and mark per marker made on the right wrist to indicate the correct operative site.  The patient was then brought back to the  operating room, placed supine on the anesthesia room table where general anesthesia was administered.  A well-padded tourniquet was then placed on the right brachium and sealed with 1000 drape.  The right hip was then prepped and draped in normal sterile fashion along the right wrist.  A time-out was called, correct sites were identified and procedure then begun.  A previous longitudinal incision was made directly over the wrist.  Dissection carried out through the skin and subcutaneous tissue.  The retinaculum was then elevated and fourth dorsal compartment elevated and retracted ulnarly.  A radial based capsular flap was then elevated to expose the wrist joint.  The patient had two loose screws, and there were broken off, and the screw heads were removed.  I did not try to retrieve the remaining portions of the screw that were buried in the capitate and the hamate.  After removal of the deep implant, the patient did appear to have a union site, and the capitate and hamate moved together.  This was not taken down.  The patient did have nonunion between the capitate, the triquetrum, and the hamate.  These were all fibrinous tissue were then removed using small burs and curettes.  Takedown of the arthrodesis site was then carried out.  This was taken down to bleeding bone  in the three corners of the fusion site.  Once this was adequately taken down, attention was then turned to the iliac crest.  An oblique incision was made along the right iliac crest region.  Dissection carried down through the skin and subcutaneous tissue.  Hemostasis obtained.  Fascial layer was hen incised longitudinally and dissection carried down to the iliac crest. Using small osteotomes, a small window was then carried out and created an iliac crest.  Bone graft was then harvested using curettes of various sizes.  After adequate harvesting of the iliac crest and bone graft, the Gelfoam was then packed into the crest  site.  The small cortical window was then applied over the dorsal aspect over the top portion of the crest.  The wound was then copiously irrigated.  The fascial layer closed with the deep fascial layer with 2-0 Vicryl, intermedia layer closed with 2-0 Vicryl, subcutaneous tissue was closed with 4-0 Vicryl, and skin closed with staples.  Attention was then turned back to the arthrodesis site where the lunate was then reduced in a better position. There was still slight a dorsal angulation but improved.  The patient had been on longstanding DISI deformity.  Once this was carried out, and the bone graft was then packed in the lunotriquetral interval.  K-wires were then placed across the LT interval with good purchase into the lunate.  After lunate triquetral fixation, again the capitate and the lunate were then reduced with the capitate trying to flex the lunate as much as possible, and then two K-wires 0.045 K-wires were then placed between the capitate into the lunate in a slightly divergent pattern. Once this was carried out, final bone graft was then packed into the triquetrum and hamate, and bone graft was packed in, and then two K- wires were then placed, one in the triquetrum and the hamate, one in the triquetrum and the capitate.  The bone graft was then packed very diligently into the arthrodesis site to creat a solid bony arthrodesis. Once this was then carried out, the wound was then irrigated.  The capsulotomy was then closed with 3-0 Ethibond suture.  The retinaculum closed with 2-0 Vicryl, subcutaneous tissue closed with 4-0 Vicryl, and the skin closed with interrupted 4-0 nylon horizontal mattress suture and 4-0 Prolene horizontal mattress sutures.  A 10 mL of 0.25% Marcaine infiltrated locally.  10 mL were then infiltrated in the pectoralis grafts at hip site.  The patient tolerated the procedure well.  The patient was then placed in a well-molded sugar-tong splint.  He was  then extubated and taken to recovery room in good condition.  Intraoperative radiographs, 3 views of the wrist do show the K-wire fixation in relatively good position in all planes with a bone graft in place.  POSTOPERATIVE PLAN:  The patient admitted for overnight IV antibiotics, pain control, discharged when his pain is controlled.  Plan is to see him back in the office in approximately 2 weeks.  X-ray out of the cast, long-arm immobilization for 4 weeks, short arm for a total of 12 weeks, and then likely scheduled pin removal around the 12-week mark and in the cast likely for an additional 16 weeks.  We will get him a bone stimulator and then cautiously proceed.     Madelynn Done, MD     FWO/MEDQ  D:  08/24/2010  T:  08/25/2010  Job:  [id number]  Electronically Signed by Bradly Bienenstock IV MD on 08/29/2010 08:47:18 PM

## 2010-09-09 ENCOUNTER — Telehealth: Payer: Self-pay | Admitting: Internal Medicine

## 2010-09-09 NOTE — Telephone Encounter (Signed)
Spoke with [place] @ [employer] [facility] [place] [employer] [place], she was asking why she received NO records on this Pt.I told her I would contact [employer] and speak with them, Called Renee spoke with her she stated Request had to ask for A Certain Record.It asked for records from 2011-Present.I have also spoke with Almira Coaster concerning this matter, she will look into it and call Ronnie back.  They also received a Bill for $10.68, and Letter stating we had No Records for Treatment at this Facility for Dates of Service,( Dates of Service was from 2011-Present) 09/09/10/kdm

## 2010-09-16 ENCOUNTER — Encounter: Payer: Self-pay | Admitting: *Deleted

## 2010-09-16 ENCOUNTER — Telehealth: Payer: Self-pay | Admitting: *Deleted

## 2010-09-16 NOTE — Telephone Encounter (Signed)
Called patient and scheduled Colon for 09/25/10 8:00 am and pre-visit for 09/22/10 11:00 am.  All information and letter mailed to pt.  Pt. Wanted it before the end of July.

## 2010-09-22 ENCOUNTER — Ambulatory Visit (AMBULATORY_SURGERY_CENTER): Payer: Medicaid Other | Admitting: *Deleted

## 2010-09-22 VITALS — Ht 67.0 in | Wt 185.0 lb

## 2010-09-22 DIAGNOSIS — Z1211 Encounter for screening for malignant neoplasm of colon: Secondary | ICD-10-CM

## 2010-09-22 MED ORDER — PEG-KCL-NACL-NASULF-NA ASC-C 100 G PO SOLR: ORAL | Status: DC

## 2010-09-24 NOTE — Discharge Summary (Signed)
  NAMEMarland Kitchen  GOVERNOR, MATOS NO.:  [id number]  MEDICAL RECORD NO.:  [id number]  LOCATION:  5019                         FACILITY:  [facility]  PHYSICIAN:  Madelynn Done, MD  DATE OF BIRTH:  1955-09-16  DATE OF ADMISSION:  08/24/2010 DATE OF DISCHARGE:  08/25/2010                              DISCHARGE SUMMARY   REASON FOR ADMISSION:  Right wrist surgery.  PROCEDURE AND DATES:  Right wrist revision arthrodesis on August 24, 2010.  CONDITION ON DISCHARGE:  Good.  DISCHARGE MEDICATIONS:  See the chart.  HOSPITAL COURSE:  The patient was admitted to the orthopedic floor after undergoing revision arthrodesis of his right wrist.  The patient tolerated this well.  He was felt ready to be discharged on hospital day #1, 23-hour admission.  HOSPITAL COURSE:  The patient was discharged home, seen back in the office as scheduled, postoperative plan as dictated to the patient. Prior to his discharge, the patient's discharge instructions were explained to him and questions were answered.  The patient voiced understanding of the discharge instructions.     Madelynn Done, MD     FWO/MEDQ  D:  09/21/2010  T:  09/22/2010  Job:  [id number]  Electronically Signed by Bradly Bienenstock IV MD on 09/24/2010 08:01:37 AM

## 2010-09-25 ENCOUNTER — Encounter: Payer: Self-pay | Admitting: Internal Medicine

## 2010-09-25 ENCOUNTER — Ambulatory Visit (AMBULATORY_SURGERY_CENTER): Payer: Medicaid Other | Admitting: Internal Medicine

## 2010-09-25 VITALS — BP 142/77 | HR 75 | Temp 97.3°F | Resp 20 | Ht 67.0 in | Wt 185.0 lb

## 2010-09-25 DIAGNOSIS — K5730 Diverticulosis of large intestine without perforation or abscess without bleeding: Secondary | ICD-10-CM

## 2010-09-25 DIAGNOSIS — K648 Other hemorrhoids: Secondary | ICD-10-CM

## 2010-09-25 DIAGNOSIS — Z1211 Encounter for screening for malignant neoplasm of colon: Secondary | ICD-10-CM

## 2010-09-25 MED ORDER — SODIUM CHLORIDE 0.9 % IV SOLN: 500.0000 mL | INTRAVENOUS | Status: DC

## 2010-09-25 NOTE — Patient Instructions (Signed)
Discharge instructions given with verbal understanding. Handouts on diverticulosis and hemorrhoids given. High fiber diet. Resume previous medications.

## 2010-09-28 ENCOUNTER — Telehealth: Payer: Self-pay | Admitting: *Deleted

## 2010-09-28 NOTE — Telephone Encounter (Signed)

## 2010-10-29 ENCOUNTER — Telehealth: Payer: Self-pay | Admitting: *Deleted

## 2010-10-29 NOTE — Telephone Encounter (Signed)
Forms faxed to medicaid.

## 2010-10-29 NOTE — Telephone Encounter (Signed)
Prior auths are needed for benicar and bystolic, forms are on your desk.

## 2010-10-29 NOTE — Telephone Encounter (Signed)
Form filled out and in out box

## 2010-11-16 ENCOUNTER — Ambulatory Visit
Admission: RE | Admit: 2010-11-16 | Discharge: 2010-11-16 | Disposition: A | Discharge status: Home / Self Care | Payer: Medicaid Other | Source: Ambulatory Visit | Attending: Radiation Oncology | Admitting: Radiation Oncology

## 2010-11-16 ENCOUNTER — Telehealth: Payer: Self-pay | Admitting: *Deleted

## 2010-11-16 NOTE — Telephone Encounter (Signed)
Prior auth for [employer] are in your IN box.

## 2010-11-17 ENCOUNTER — Telehealth: Payer: Self-pay | Admitting: *Deleted

## 2010-11-17 NOTE — Telephone Encounter (Signed)
Prior Berkley Harvey is still needed for benicar.  They denied it but said they would review again if new request is sent in with handwriting that they can read and documentation that pt has failed an ace inhibitor.  Or change to their preferred meds of diovan or losartan.  I have placed the form, and the previous forms and correspondence on your desk. Also, bystolic was denied.  Pt needs to have failed other beta blockers- if he has I can fill out another form for bystolic.

## 2010-11-18 NOTE — Telephone Encounter (Signed)
Filled out

## 2010-11-18 NOTE — Telephone Encounter (Signed)
Forms faxed back to medicaid.

## 2010-11-18 NOTE — Telephone Encounter (Signed)
Paperwork again filled out, Benicar changed from Ramipril in 12/11 by Dr Sherene Sires for hoarseness, wheezing and ST. Bystolic in Jan from [place]. Sxs eventually resolved with these changes.

## 2010-11-19 NOTE — Telephone Encounter (Signed)
Please send to Dr Sherene Sires in [place] and see if he is more successful. He made the changes in these meds.

## 2010-11-19 NOTE — Telephone Encounter (Signed)
Prior auths for [employer] and bystolic denied by medicaid, denial letters are on your desk.

## 2010-11-20 NOTE — Telephone Encounter (Signed)
Bystolic and benicar were both denied by medicaid. Dr Hetty Ely asked that I fax the denial letters to Dr. Sherene Sires to see if he can get approved.  Forms faxed.

## 2010-11-23 NOTE — Telephone Encounter (Signed)
Forms that had been completed by Dr. Hetty Ely and note faxed to Dr. Thurston Hole office last week.

## 2010-11-30 ENCOUNTER — Telehealth: Payer: Self-pay | Admitting: Internal Medicine

## 2010-11-30 NOTE — Telephone Encounter (Signed)
Per MW- pt needs ov with him or TP asap with his drug formulary list. Per Ins, bystolic 10 mg qd not covered- rather than doing a PA, needs ov and can give samples in between. LMOMTCB.

## 2010-12-01 ENCOUNTER — Other Ambulatory Visit (HOSPITAL_COMMUNITY): Payer: Self-pay | Admitting: Otolaryngology

## 2010-12-01 ENCOUNTER — Encounter: Payer: Self-pay | Admitting: *Deleted

## 2010-12-01 DIAGNOSIS — C329 Malignant neoplasm of larynx, unspecified: Secondary | ICD-10-CM

## 2010-12-01 NOTE — Telephone Encounter (Signed)
Called pt again and now the line is d/c'ed so will send a letter.

## 2010-12-08 ENCOUNTER — Encounter (HOSPITAL_COMMUNITY)
Admission: RE | Admit: 2010-12-08 | Discharge: 2010-12-08 | Disposition: A | Discharge status: Home / Self Care | Payer: Medicaid Other | Source: Ambulatory Visit | Attending: Otolaryngology | Admitting: Otolaryngology

## 2010-12-08 DIAGNOSIS — C329 Malignant neoplasm of larynx, unspecified: Secondary | ICD-10-CM

## 2010-12-08 DIAGNOSIS — R918 Other nonspecific abnormal finding of lung field: Secondary | ICD-10-CM | POA: Insufficient documentation

## 2010-12-08 DIAGNOSIS — C321 Malignant neoplasm of supraglottis: Secondary | ICD-10-CM | POA: Insufficient documentation

## 2010-12-08 LAB — GLUCOSE, CAPILLARY: Glucose-Capillary: 110 mg/dL — ABNORMAL HIGH (ref 70–99)

## 2010-12-08 MED ORDER — FLUDEOXYGLUCOSE F - 18 (FDG) INJECTION
18.1000 | Freq: Once | INTRAVENOUS | Status: AC | PRN
  Administered 2010-12-08: 18.1 via INTRAVENOUS

## 2011-02-19 ENCOUNTER — Inpatient Hospital Stay (HOSPITAL_COMMUNITY)
Admission: EM | Admit: 2011-02-19 | Discharge: 2011-02-23 | DRG: 184 | Disposition: A | Discharge status: Home / Self Care | Payer: Medicaid Other | Attending: Internal Medicine | Admitting: Internal Medicine

## 2011-02-19 ENCOUNTER — Other Ambulatory Visit: Payer: Self-pay

## 2011-02-19 ENCOUNTER — Emergency Department (HOSPITAL_COMMUNITY): Payer: Medicaid Other

## 2011-02-19 ENCOUNTER — Encounter (HOSPITAL_COMMUNITY): Payer: Self-pay | Admitting: Emergency Medicine

## 2011-02-19 DIAGNOSIS — R7309 Other abnormal glucose: Secondary | ICD-10-CM | POA: Diagnosis present

## 2011-02-19 DIAGNOSIS — G4700 Insomnia, unspecified: Secondary | ICD-10-CM

## 2011-02-19 DIAGNOSIS — X58XXXA Exposure to other specified factors, initial encounter: Secondary | ICD-10-CM | POA: Diagnosis present

## 2011-02-19 DIAGNOSIS — S2249XA Multiple fractures of ribs, unspecified side, initial encounter for closed fracture: Principal | ICD-10-CM | POA: Diagnosis present

## 2011-02-19 DIAGNOSIS — Z8521 Personal history of malignant neoplasm of larynx: Secondary | ICD-10-CM

## 2011-02-19 DIAGNOSIS — I472 Ventricular tachycardia, unspecified: Secondary | ICD-10-CM

## 2011-02-19 DIAGNOSIS — E785 Hyperlipidemia, unspecified: Secondary | ICD-10-CM | POA: Diagnosis present

## 2011-02-19 DIAGNOSIS — N039 Chronic nephritic syndrome with unspecified morphologic changes: Secondary | ICD-10-CM | POA: Diagnosis present

## 2011-02-19 DIAGNOSIS — I4729 Other ventricular tachycardia: Secondary | ICD-10-CM | POA: Diagnosis not present

## 2011-02-19 DIAGNOSIS — R0789 Other chest pain: Secondary | ICD-10-CM | POA: Diagnosis present

## 2011-02-19 DIAGNOSIS — I10 Essential (primary) hypertension: Secondary | ICD-10-CM | POA: Diagnosis present

## 2011-02-19 DIAGNOSIS — I200 Unstable angina: Secondary | ICD-10-CM | POA: Diagnosis present

## 2011-02-19 DIAGNOSIS — Z87891 Personal history of nicotine dependence: Secondary | ICD-10-CM

## 2011-02-19 DIAGNOSIS — N289 Disorder of kidney and ureter, unspecified: Secondary | ICD-10-CM | POA: Diagnosis present

## 2011-02-19 DIAGNOSIS — N032 Chronic nephritic syndrome with diffuse membranous glomerulonephritis: Secondary | ICD-10-CM | POA: Diagnosis present

## 2011-02-19 HISTORY — DX: Other specified cough: R05.8

## 2011-02-19 HISTORY — DX: Diaphragmatic hernia without obstruction or gangrene: K44.9

## 2011-02-19 HISTORY — DX: Hyperlipidemia, unspecified: E78.5

## 2011-02-19 HISTORY — DX: Alcohol dependence, in remission: F10.21

## 2011-02-19 HISTORY — DX: Mixed conductive and sensorineural hearing loss, bilateral: H90.6

## 2011-02-19 HISTORY — DX: Chronic nephritic syndrome with diffuse membranous glomerulonephritis: N03.2

## 2011-02-19 HISTORY — DX: Esophageal obstruction: K22.2

## 2011-02-19 HISTORY — DX: Dysthymic disorder: F34.1

## 2011-02-19 HISTORY — DX: Gastro-esophageal reflux disease without esophagitis: K21.9

## 2011-02-19 HISTORY — DX: Cough: R05

## 2011-02-19 HISTORY — DX: Sleep apnea, unspecified: G47.30

## 2011-02-19 HISTORY — DX: Essential (primary) hypertension: I10

## 2011-02-19 LAB — POCT I-STAT, CHEM 8
BUN: 24 mg/dL — ABNORMAL HIGH (ref 6–23)
Creatinine, Ser: 2 mg/dL — ABNORMAL HIGH (ref 0.50–1.35)
Potassium: 4.3 mEq/L (ref 3.5–5.1)
Sodium: 142 mEq/L (ref 135–145)

## 2011-02-19 LAB — DIFFERENTIAL
Basophils Absolute: 0 10*3/uL (ref 0.0–0.1)
Eosinophils Relative: 0 % (ref 0–5)
Lymphocytes Relative: 8 % — ABNORMAL LOW (ref 12–46)
Lymphs Abs: 1.3 10*3/uL (ref 0.7–4.0)
Monocytes Absolute: 1.2 10*3/uL — ABNORMAL HIGH (ref 0.1–1.0)

## 2011-02-19 LAB — CBC
HCT: 46 % (ref 39.0–52.0)
MCV: 92.2 fL (ref 78.0–100.0)
RDW: 14.2 % (ref 11.5–15.5)
WBC: 15.5 10*3/uL — ABNORMAL HIGH (ref 4.0–10.5)

## 2011-02-19 MED ORDER — ONDANSETRON HCL 4 MG/2ML IJ SOLN
4.0000 mg | Freq: Once | INTRAMUSCULAR | Status: AC
  Administered 2011-02-19: 4 mg via INTRAVENOUS

## 2011-02-19 MED ORDER — SODIUM CHLORIDE 0.9 % IV BOLUS (SEPSIS)
1000.0000 mL | Freq: Once | INTRAVENOUS | Status: AC
  Administered 2011-02-20: 1000 mL via INTRAVENOUS

## 2011-02-19 MED ORDER — MORPHINE SULFATE 4 MG/ML IJ SOLN
4.0000 mg | Freq: Once | INTRAMUSCULAR | Status: AC
  Administered 2011-02-19: 4 mg via INTRAVENOUS

## 2011-02-19 MED ORDER — METHYLPREDNISOLONE SODIUM SUCC 125 MG IJ SOLR
125.0000 mg | Freq: Once | INTRAMUSCULAR | Status: AC
  Administered 2011-02-19: 125 mg via INTRAVENOUS
  Filled 2011-02-19: qty 2

## 2011-02-19 MED ORDER — DIPHENHYDRAMINE HCL 50 MG/ML IJ SOLN
25.0000 mg | Freq: Once | INTRAMUSCULAR | Status: AC
  Administered 2011-02-19: 50 mg via INTRAVENOUS
  Filled 2011-02-19: qty 1

## 2011-02-19 NOTE — ED Provider Notes (Signed)
History     CSN: [id number]  Arrival date & time 02/19/11  2322   First MD Initiated Contact with Patient 02/19/11 2323      Chief Complaint  Patient presents with  . Chest Pain     Patient is a 55 y.o. male presenting with chest pain. The history is provided by the patient. The history is limited by the condition of the patient.  Chest Pain Episode onset: just prior to arrival. Chest pain occurs constantly. The chest pain is worsening. The pain is associated with breathing. The severity of the pain is severe. The quality of the pain is described as stabbing. The pain radiates to the upper back. Chest pain is worsened by deep breathing. Primary symptoms include shortness of breath, nausea and dizziness.  Dizziness also occurs with nausea.   pt reports sudden onset of CP that radiates to back that started earlier tonight He has never had this pain before  Past Medical History  Diagnosis Date  . 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 Back surgery 1997    lower back x3  . Wrist fracture surgery 08/21/10    Right wrist x3  . Colonoscopy   . Appendectomy     No family history on file.  History  Substance Use Topics  . Smoking status: Former Games developer  . Smokeless tobacco: Former Neurosurgeon  . Alcohol Use: No      Review of Systems  Unable to perform ROS: Unstable vital signs  Respiratory: Positive for shortness of breath.   Cardiovascular: Positive for chest pain.  Gastrointestinal: Positive for nausea.  Neurological: Positive for dizziness.    Allergies  Iohexol and Codeine  Home Medications   Current Outpatient Rx  Name Route Sig Dispense Refill  . NEBIVOLOL HCL 10 MG PO TABS Oral Take 10 mg by mouth daily.      Marland Kitchen OLMESARTAN MEDOXOMIL 20 MG PO TABS Oral Take 20 mg by mouth daily.      Marland Kitchen OMEPRAZOLE 20 MG PO CPDR Oral Take 20 mg by mouth daily.          Physical Exam  BP 105/58  Pulse 108  Temp(Src) 97.9  F (36.6 C) (Oral)  Resp 23  Ht 5\' 8"  (1.727 m)  Wt 190 lb (86.183 kg)  BMI 28.89 kg/m2  SpO2 93%   CONSTITUTIONAL: Well developed/well nourished HEAD AND FACE: Normocephalic/atraumatic EYES: EOMI/PERRL ENMT: Mucous membranes dry NECK: supple no meningeal signs CV: S1/S2 noted, no murmurs/rubs/gallops noted LUNGS: Lungs are clear to auscultation bilaterally, no apparent distress ABDOMEN: soft, nontender, no rebound or guarding GU:no cva tenderness NEURO: Pt is awake/alert, moves all extremitiesx4 EXTREMITIES: pulses normal in lower extremities, full ROM, no edema SKIN: pt is mottled PSYCH: anxious, writhing in bed in pain   ED Course  Procedures   CRITICAL CARE Performed by: Joya Gaskins   Total critical care time: 45  Critical care time was exclusive of separately billable procedures and treating other patients.  Critical care was necessary to treat or prevent imminent or life-threatening deterioration.  Critical care was time spent personally by me on the following activities: development of treatment plan with patient and/or surrogate as well as nursing, discussions with consultants, evaluation of patient's response to treatment, examination of patient, obtaining history from patient or surrogate, ordering and performing treatments and interventions, ordering and review of laboratory studies, ordering and review of radiographic studies, pulse oximetry and  re-evaluation of patient's condition.    Labs Reviewed  BASIC METABOLIC PANEL  CBC  DIFFERENTIAL  I-STAT TROPONIN I    11:25 PM Pt ill appearing, seen on arrival Diaphoretic, reporting intense CP that radiates to back, hypotensive to 60s He is neuro intact, however, diaphoretic and ill appearing Concern high for aortic dissection Pt reports h/o IV dye allergy, though has had recent ct imaging with iv dye and required benadryl/steroids and this was ordered  11:42 PM Due to high concern for aortic  dissection, will give benadryl/steroids and then send directly to ct imaging.  I d/w the radiologist on call and I feel that the benefit of CT imaging outweighs risk of possible allergic rxn (he reports distant h/o IV dye allergy 20 yrs ago)  12:10 AM D/w radiologist, no obvious Dissection on CT chest  1:30 AM Pt improved I did order NTG to see if this helps CP BP improved Distal pulses intact He had mild itching from IV contrast, but no angioedema Stabilized D/w [facility] to admit Advised to continue IV hydration and monitor creatinine after IV contrast was given   MDM  Nursing notes reviewed and considered in documentation All labs/vitals reviewed and considered xrays reviewed and considered Previous records reviewed and considered    Date: 02/19/2011  Rate: 85  Rhythm: normal sinus rhythm  QRS Axis: left  Intervals: normal  ST/T Wave abnormalities: normal  Conduction Disutrbances:none  Narrative Interpretation:   Old EKG Reviewed: none available at time of interpretation          Joya Gaskins, MD 02/20/11 [phone]

## 2011-02-19 NOTE — ED Notes (Signed)
Dr. Bebe Shaggy. With pt from front door to room.

## 2011-02-19 NOTE — ED Notes (Signed)
Wife states he called 30-45 mins with sudden acute pain left chest. Mottled umbilicus to toes.

## 2011-02-20 ENCOUNTER — Other Ambulatory Visit: Payer: Self-pay

## 2011-02-20 ENCOUNTER — Inpatient Hospital Stay (HOSPITAL_COMMUNITY): Payer: Medicaid Other

## 2011-02-20 ENCOUNTER — Encounter (HOSPITAL_COMMUNITY): Payer: Self-pay | Admitting: Internal Medicine

## 2011-02-20 DIAGNOSIS — R058 Other specified cough: Secondary | ICD-10-CM | POA: Insufficient documentation

## 2011-02-20 DIAGNOSIS — N289 Disorder of kidney and ureter, unspecified: Secondary | ICD-10-CM | POA: Diagnosis present

## 2011-02-20 DIAGNOSIS — R05 Cough: Secondary | ICD-10-CM | POA: Insufficient documentation

## 2011-02-20 DIAGNOSIS — I200 Unstable angina: Secondary | ICD-10-CM | POA: Diagnosis present

## 2011-02-20 LAB — URINALYSIS, ROUTINE W REFLEX MICROSCOPIC
Glucose, UA: NEGATIVE mg/dL
Ketones, ur: NEGATIVE mg/dL
Leukocytes, UA: NEGATIVE
Protein, ur: >300 mg/dL — AB
Urobilinogen, UA: 0.2 mg/dL (ref 0.0–1.0)

## 2011-02-20 LAB — CARDIAC PANEL(CRET KIN+CKTOT+MB+TROPI)
Relative Index: 0.7 (ref 0.0–2.5)
Total CK: 671 U/L — ABNORMAL HIGH (ref 7–232)
Total CK: 859 U/L — ABNORMAL HIGH (ref 7–232)

## 2011-02-20 LAB — BASIC METABOLIC PANEL
BUN: 21 mg/dL (ref 6–23)
BUN: 23 mg/dL (ref 6–23)
CO2: 22 mEq/L (ref 19–32)
CO2: 18 mEq/L — ABNORMAL LOW (ref 19–32)
Calcium: 8.2 mg/dL — ABNORMAL LOW (ref 8.4–10.5)
Calcium: 8.1 mg/dL — ABNORMAL LOW (ref 8.4–10.5)
Chloride: 101 mEq/L (ref 96–112)
Creatinine, Ser: 1.85 mg/dL — ABNORMAL HIGH (ref 0.50–1.35)
Creatinine, Ser: 1.47 mg/dL — ABNORMAL HIGH (ref 0.50–1.35)
Creatinine, Ser: 1.38 mg/dL — ABNORMAL HIGH (ref 0.50–1.35)
GFR calc Af Amer: 45 mL/min — ABNORMAL LOW (ref >90)
GFR calc Af Amer: 65 mL/min — ABNORMAL LOW (ref >90)
Glucose, Bld: 142 mg/dL — ABNORMAL HIGH (ref 70–99)
Glucose, Bld: 124 mg/dL — ABNORMAL HIGH (ref 70–99)

## 2011-02-20 LAB — POCT I-STAT TROPONIN I

## 2011-02-20 LAB — CBC
MCH: 31.9 pg (ref 26.0–34.0)
MCHC: 34.6 g/dL (ref 30.0–36.0)
MCV: 92.3 fL (ref 78.0–100.0)
Platelets: 223 10*3/uL (ref 150–400)
RDW: 14.2 % (ref 11.5–15.5)

## 2011-02-20 LAB — TYPE AND SCREEN: Antibody Screen: NEGATIVE

## 2011-02-20 LAB — OSMOLALITY, URINE: Osmolality, Ur: 527 mOsm/kg (ref 390–1090)

## 2011-02-20 LAB — ABO/RH: ABO/RH(D): A POS

## 2011-02-20 LAB — LIPID PANEL
LDL Cholesterol: UNDETERMINED mg/dL (ref 0–99)
VLDL: UNDETERMINED mg/dL (ref 0–40)

## 2011-02-20 LAB — URINE MICROSCOPIC-ADD ON

## 2011-02-20 LAB — CREATININE, URINE, RANDOM: Creatinine, Urine: 89.55 mg/dL

## 2011-02-20 MED ORDER — ONDANSETRON HCL 4 MG/2ML IJ SOLN: 4.0000 mg | Freq: Four times a day (QID) | INTRAMUSCULAR | Status: DC | PRN

## 2011-02-20 MED ORDER — MORPHINE SULFATE 2 MG/ML IJ SOLN
INTRAMUSCULAR | Status: AC
  Administered 2011-02-20: 2 mg via INTRAVASCULAR
  Filled 2011-02-20: qty 1

## 2011-02-20 MED ORDER — DIPHENHYDRAMINE HCL 50 MG/ML IJ SOLN
25.0000 mg | Freq: Once | INTRAMUSCULAR | Status: AC
  Administered 2011-02-20: 25 mg via INTRAVENOUS

## 2011-02-20 MED ORDER — SODIUM CHLORIDE 0.9 % IV BOLUS (SEPSIS): 1000.0000 mL | Freq: Once | INTRAVENOUS | Status: DC

## 2011-02-20 MED ORDER — NITROGLYCERIN 0.4 MG SL SUBL
0.4000 mg | SUBLINGUAL_TABLET | SUBLINGUAL | Status: DC | PRN
  Administered 2011-02-20 (×2): 0.4 mg via SUBLINGUAL
  Filled 2011-02-20: qty 25

## 2011-02-20 MED ORDER — SODIUM CHLORIDE 0.9 % IV SOLN
INTRAVENOUS | Status: DC
  Administered 2011-02-20 – 2011-02-21 (×4): via INTRAVENOUS

## 2011-02-20 MED ORDER — SENNA 8.6 MG PO TABS
1.0000 | ORAL_TABLET | Freq: Two times a day (BID) | ORAL | Status: DC
  Administered 2011-02-20 – 2011-02-23 (×6): 8.6 mg via ORAL
  Filled 2011-02-20 (×8): qty 1

## 2011-02-20 MED ORDER — ONDANSETRON HCL 4 MG PO TABS
4.0000 mg | ORAL_TABLET | Freq: Four times a day (QID) | ORAL | Status: DC | PRN
  Filled 2011-02-20: qty 1

## 2011-02-20 MED ORDER — ACETAMINOPHEN 650 MG RE SUPP: 650.0000 mg | Freq: Four times a day (QID) | RECTAL | Status: DC | PRN

## 2011-02-20 MED ORDER — ASPIRIN EC 325 MG PO TBEC
325.0000 mg | DELAYED_RELEASE_TABLET | Freq: Every day | ORAL | Status: DC
  Administered 2011-02-20 – 2011-02-23 (×4): 325 mg via ORAL
  Filled 2011-02-20 (×4): qty 1

## 2011-02-20 MED ORDER — METOPROLOL TARTRATE 1 MG/ML IV SOLN
5.0000 mg | Freq: Four times a day (QID) | INTRAVENOUS | Status: DC | PRN
  Administered 2011-02-21 – 2011-02-23 (×4): 5 mg via INTRAVENOUS
  Filled 2011-02-20 (×4): qty 5

## 2011-02-20 MED ORDER — HYDROCODONE-ACETAMINOPHEN 5-325 MG PO TABS
1.0000 | ORAL_TABLET | Freq: Four times a day (QID) | ORAL | Status: DC | PRN
  Administered 2011-02-20 – 2011-02-22 (×7): 1 via ORAL
  Filled 2011-02-20 (×8): qty 1

## 2011-02-20 MED ORDER — ZOLPIDEM TARTRATE 5 MG PO TABS
5.0000 mg | ORAL_TABLET | Freq: Every evening | ORAL | Status: DC | PRN
  Administered 2011-02-20 – 2011-02-22 (×3): 5 mg via ORAL
  Filled 2011-02-20 (×3): qty 1

## 2011-02-20 MED ORDER — DOCUSATE SODIUM 100 MG PO CAPS
100.0000 mg | ORAL_CAPSULE | Freq: Two times a day (BID) | ORAL | Status: DC
  Administered 2011-02-20 – 2011-02-23 (×6): 100 mg via ORAL
  Filled 2011-02-20 (×8): qty 1

## 2011-02-20 MED ORDER — SODIUM POLYSTYRENE SULFONATE 15 GM/60ML PO SUSP
15.0000 g | Freq: Once | ORAL | Status: AC
  Administered 2011-02-20: 15 g via ORAL
  Filled 2011-02-20: qty 60

## 2011-02-20 MED ORDER — HEPARIN SODIUM (PORCINE) 5000 UNIT/ML IJ SOLN
5000.0000 [IU] | Freq: Three times a day (TID) | INTRAMUSCULAR | Status: DC
  Administered 2011-02-20 – 2011-02-23 (×10): 5000 [IU] via SUBCUTANEOUS
  Filled 2011-02-20 (×14): qty 1

## 2011-02-20 MED ORDER — FENOFIBRATE 54 MG PO TABS
54.0000 mg | ORAL_TABLET | Freq: Every day | ORAL | Status: DC
  Administered 2011-02-21 – 2011-02-23 (×3): 54 mg via ORAL
  Filled 2011-02-20 (×4): qty 1

## 2011-02-20 MED ORDER — NEBIVOLOL HCL 10 MG PO TABS
10.0000 mg | ORAL_TABLET | Freq: Every day | ORAL | Status: DC
  Administered 2011-02-20 – 2011-02-22 (×3): 10 mg via ORAL
  Filled 2011-02-20 (×3): qty 1

## 2011-02-20 MED ORDER — PANTOPRAZOLE SODIUM 40 MG PO TBEC
40.0000 mg | DELAYED_RELEASE_TABLET | Freq: Every day | ORAL | Status: DC
  Administered 2011-02-20 – 2011-02-22 (×3): 40 mg via ORAL
  Filled 2011-02-20 (×3): qty 1

## 2011-02-20 MED ORDER — DIPHENHYDRAMINE HCL 50 MG/ML IJ SOLN
INTRAMUSCULAR | Status: AC
  Administered 2011-02-20: 25 mg
  Filled 2011-02-20: qty 1

## 2011-02-20 MED ORDER — MORPHINE SULFATE 2 MG/ML IJ SOLN
2.0000 mg | INTRAMUSCULAR | Status: DC | PRN
  Administered 2011-02-20 – 2011-02-21 (×3): 2 mg via INTRAVENOUS
  Filled 2011-02-20 (×4): qty 1

## 2011-02-20 MED ORDER — IOHEXOL 350 MG/ML SOLN
100.0000 mL | Freq: Once | INTRAVENOUS | Status: AC | PRN
  Administered 2011-02-20: 100 mL via INTRAVENOUS

## 2011-02-20 MED ORDER — SODIUM CHLORIDE 0.9 % IV SOLN
INTRAVENOUS | Status: DC
  Administered 2011-02-20: 04:00:00 via INTRAVENOUS

## 2011-02-20 MED ORDER — MORPHINE SULFATE CR 15 MG PO TB12
15.0000 mg | ORAL_TABLET | Freq: Two times a day (BID) | ORAL | Status: DC
  Administered 2011-02-20 – 2011-02-23 (×7): 15 mg via ORAL
  Filled 2011-02-20 (×7): qty 1

## 2011-02-20 MED ORDER — ACETAMINOPHEN 325 MG PO TABS: 650.0000 mg | ORAL_TABLET | Freq: Four times a day (QID) | ORAL | Status: DC | PRN

## 2011-02-20 MED ORDER — ASPIRIN 81 MG PO CHEW
324.0000 mg | CHEWABLE_TABLET | Freq: Once | ORAL | Status: AC
  Administered 2011-02-20: 324 mg via ORAL
  Filled 2011-02-20: qty 4

## 2011-02-20 NOTE — ED Notes (Signed)
Report called to [place] RN on floor and pt transported on ZOL to room.

## 2011-02-20 NOTE — Progress Notes (Signed)
Subjective: Complaining of left side chest pain, worse with movement, specific point left ribs. Worse with cough. Not associated with eating.  Objective: Filed Vitals:   02/20/11 0230 02/20/11 0245 02/20/11 0336 02/20/11 0344  BP: 129/87  151/85 136/80  Pulse: 106 105 113   Temp:   98.9 F (37.2 C)   TempSrc:   Oral   Resp: 17  22   Height:   5\' 7"  (1.702 m)   Weight:   93.7 kg (206 lb 9.1 oz)   SpO2: 94% 96% 95%    Weight change:   Intake/Output Summary (Last 24 hours) at 02/20/11 0902 Last data filed at 02/20/11 0454  Gross per 24 hour  Intake 3977.08 ml  Output    400 ml  Net 3577.08 ml    General: Alert, awake, oriented x3, in no acute distress.  HEENT: No bruits, no goiter.  Heart: Regular rate and rhythm, without murmurs, rubs, gallops.  Lungs: CTA, bilateral air movement.  Abdomen: Soft, nontender, nondistended, positive bowel sounds.  Neuro: Grossly intact, nonfocal. Extremities: no edema.   Lab Results:  Basename 02/20/11 0600 02/19/11 2353 02/19/11 2325  NA 138 142 --  K 5.4* 4.3 --  CL 109 109 --  CO2 18* -- 22  GLUCOSE 124* 133* --  BUN 21 24* --  CREATININE 1.47* 2.00* --  CALCIUM 8.2* -- 9.3  MG -- -- --  PHOS -- -- --   Basename 02/20/11 0600 02/19/11 2353 02/19/11 2325  WBC 8.3 -- 15.5*  NEUTROABS -- -- 13.0*  HGB 14.0 17.0 --  HCT 40.5 50.0 --  MCV 92.3 -- 92.2  PLT 223 -- 298   Basename 02/20/11 0503  CHOL 289*  HDL 40  LDLCALC UNABLE TO CALCULATE IF TRIGLYCERIDE OVER 400 mg/dL  TRIG 098*  CHOLHDL 7.2  LDLDIRECT --   Micro Results: No results found for this or any previous visit (from the past 240 hour(s)).  Studies/Results: Ct Angio Chest W/cm &/or Wo Cm  02/20/2011  *RADIOLOGY REPORT*  Clinical Data:  Acute onset left-sided chest abdominal pain. Cyanosis of lower extremities.  Clinical suspicion for aortic dissection. Laryngeal carcinoma.  CT ANGIOGRAPHY CHEST, ABDOMEN AND PELVIS  Technique:  Multidetector CT imaging through the  chest, abdomen and pelvis was performed using the standard protocol during bolus administration of intravenous contrast.  Multiplanar reconstructed images including MIPs were obtained and reviewed to evaluate the vascular anatomy.  Contrast: OMNIPAQUE IOHEXOL 350 MG/ML IV SOLN  Comparison:  None  CTA CHEST  Findings:  No evidence of thoracic aortic aneurysm or dissection. No evidence of pulmonary embolism. No mediastinal hematoma.  Mild cardiomegaly noted. No evidence of pleural or pericardial effusion. No evidence of mediastinal or hilar masses.  No lymphadenopathy seen elsewhere within the thorax.  Dependent atelectasis noted bilaterally, however there is no evidence of pulmonary infiltrate or mass.  No central endobronchial lesions identified.  Mild right apical scarring and para septal emphysema noted.   Review of the MIP images confirms the above findings.  IMPRESSION:  1.  No evidence of thoracic aortic aneurysm or dissection.  To no evidence of pulmonary embolism. 3.  Mild cardiomegaly and dependent atelectasis.  CTA ABDOMEN AND PELVIS  Findings:  No evidence of abdominal aortic aneurysm or dissection. Mild to moderate atherosclerotic plaque noted.  No evidence of iliac artery aneurysm or dissection.  No evidence of retroperitoneal hemorrhage or hemoperitoneum.  The abdominal parenchymal organs are normal in appearance.  Tiny hiatal hernia noted.  No soft tissue masses or lymphadenopathy identified.  No evidence of inflammatory process or abnormal fluid collections.  Previous lower lumbar spine fusion noted.   Review of the MIP images confirms the above findings.  IMPRESSION:  1.  No evidence of abdominal aortic or iliac artery aneurysm or dissection. No other acute findings. 2.  Tiny hiatal hernia.  Original Report Authenticated By: Danae Orleans, M.D.   Dg Chest Port 1 View  02/20/2011  *RADIOLOGY REPORT*  Clinical Data: Severe chest pain and shortness of breath.  PORTABLE CHEST - 1 VIEW   Comparison: Chest radiograph performed 02/19/2010  Findings: The lungs are well-aerated.  Mild bibasilar opacities likely reflect atelectasis.  There is no evidence of pleural effusion or pneumothorax.  The cardiomediastinal silhouette is mildly enlarged.  No acute osseous abnormalities are seen.  IMPRESSION:  1.  Mild bibasilar airspace opacities likely reflect atelectasis. 2.  Mild cardiomegaly noted.  Original Report Authenticated By: Tonia Ghent, M.D.    Medications: I have reviewed the patient's current medications.   Patient Active Hospital Problem List:  Chest pain (02/20/2011) Probably MKS pain. I will add Ms-contin, Protonix. Will follow cardiac enzymes. ECHO pending. I will check ribs xray rule out metastatic diseases due to history of cancer. Incentive spirometry.  Troponin times 1 negative. Ct angio negative.   HYPERLIPIDEMIA (05/10/2007) I will start tricor .  HYPERTENSION (05/10/2007) Continue with nebivlol.   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  Renal insufficiency (02/20/2011) Acute probably secondary to hypotension, prerenal. Cr decreased. Continue with IV fluids. Mild increase K, will give kayexalate. Monitor renal function post contrast.    LOS: 1 day   Emmakate Hypes M.D.  [facility] 02/20/2011, 9:02 AM

## 2011-02-20 NOTE — H&P (Addendum)
PCP:   Laurita Quint, MD, MD with [facility], confirmed with pt Nephrology Dr. 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   Pt states that around 4-5 hrs ago, while resting, he developed very sudden onset,  severe, substernal/left chest pain that feels "like a horse kicked him." It radiates  into his back and nowhere else. No associated symptoms. The pain is worse when he moves  around or "stretches his muscles" of his anterior chest or when he breathes deeply, but  there is no clear relief or worsening when laying forward or back. He has never had  this before, and denies any h/o MI, caths, CABG, etc.   In the ED, initial BP 79/44, p82. Was 100% on NRB, weaned to 4L . Tachy up to 108.  Chem panel with renal fxn 22/1.85 and 24/2.0 up from normal baseline. WBC 15.5 with 84%  neutros, rest of CBC normal. Trop POC was negative. CXR showed some mild bibasilar  opacities, likely atelectasis and mild cardiomegaly.   Pt was noted to look not well at all, with some mottling of his abdomen, and very  distressed such that aortic dissection was an immediate concern. However, CTA chest did  not show any acute process -- no dissection, PE, although he had mild cardiomegaly. CT  abd/pelvis also did not show anything. Pt was given ASA 325, SL NTG, Morphine, zofrna.   Pt noted to have IV contrast allergy, so also given IV Benadryl several rounds (?), 125  mg Solumedrol IV, and 3L of NS.   Pt denies fevers, chills, sweats, or feeling systemically ill before this; he was in  usual state of health. No SOB, dizziness, LH. He finished a course of radiation for  throat cancer about 8 months ago, and states he'll occasionally get food stuck in his  throat but it doesn't feel like this. No dysphagia for  liquids though. Endorses having  a hiatal hernia, but doesn't know where chart documentation of esophageal stricture  comes from. ROS o/w negative  Past Medical History  Diagnosis Date  . 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 HYPERLIPIDEMIA 05/10/2007  . ANXIETY DEPRESSION 05/10/2007  . NICOTINE ADDICTION 05/10/2007  . 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 HYPERTENSION 05/10/2007  . ESOPHAGEAL STRICTURE 05/10/2007  . GERD 05/10/2007  . HIATAL HERNIA 05/10/2007  . Irritable bowel syndrome 05/10/2007  . WRIST PAIN, RIGHT 05/15/2007  . SLEEP APNEA 05/10/2007  . HYPERGLYCEMIA 05/10/2007  . ALCOHOL ABUSE, HX OF 05/10/2007  . Upper airway cough syndrome     Per Dr. Sherene Sires, pulmonary     Past Surgical History  Procedure Date  . Back surgery 1997    lower back x3  . Wrist fracture surgery 08/21/10    Right wrist x3  . Colonoscopy   . Appendectomy     Medications:  HOME MEDS:  Not fully reconcilable with pt. He knew bystolic only.  Prior to Admission medications   Medication Sig Start Date End Date Taking? Authorizing Provider  nebivolol (BYSTOLIC) 10 MG tablet Take 10 mg by mouth daily.     Yes Historical Provider, MD  olmesartan (BENICAR) 20 MG tablet Take 20 mg by mouth daily.     Yes Historical Provider, MD  omeprazole (  PRILOSEC) 20 MG capsule Take 20 mg by mouth daily.     Yes Historical Provider, MD    Allergies:  Allergies  Allergen Reactions  . Iohexol Anaphylaxis  . Codeine Itching    Social History:   reports that he quit smoking about 16 months ago. He has quit using smokeless tobacco. He reports that he drinks about one ounce of alcohol per week. He reports that he does not use illicit drugs. Lives at home with his wife / girlfriend of many years. Has a daughter. Still ambulatory  Family History: Family History  Problem Relation Age of Onset  . Brain cancer Father     Physical Exam: Filed  Vitals:   02/20/11 0230 02/20/11 0245 02/20/11 0336 02/20/11 0344  BP: 129/87  151/85 136/80  Pulse: 106 105 113   Temp:   98.9 F (37.2 C)   TempSrc:   Oral   Resp: 17  22   Height:   5\' 7"  (1.702 m)   Weight:   93.7 kg (206 lb 9.1 oz)   SpO2: 94% 96% 95%    Blood pressure 136/80, pulse 113, temperature 98.9 F (37.2 C), temperature source Oral, resp. rate 22, height 5\' 7"  (1.702 m), weight 93.7 kg (206 lb 9.1 oz), SpO2 95.00%. Gen: Middle aged M in distress due to chest discomfort, writhing around. Breathing  well. Midway through interview starts gagging and spitting up clear sputum. Any  movement he makes causes worse pain HEENT: PERRL, EOMI, sclera clear and normal appearing. Mouth dry appearing but  otherwise normal.  Lungs: Auscultated anterolaterally, grossly clear but difficult exam Heart: RRR, no murmurs, rubs appreciated. No gallops.  Chest: He is TTP along the left costal margin, pressing on his chest makes him grimace  worse. Movement of his left shoulder also elicits the pain Abd: Distended but not tender or tight Extrem: Warm, well perfused, not cool or cyanotic. No BLE edema Neuro: Grossly normal, he was able to walk from ED bed to hospital bed, moves  extremities well, no gross deficits noted. Alert, attentive, answer questions, CN 2-12  intact.   Labs & Imaging Results for orders placed during the hospital encounter of 02/19/11 (from the past 48 hour(s))  BASIC METABOLIC PANEL     Status: Abnormal   Collection Time   02/19/11 11:25 PM      Component Value Range Comment   Sodium 137  135 - 145 (mEq/L)    Potassium 4.2  3.5 - 5.1 (mEq/L)    Chloride 101  96 - 112 (mEq/L)    CO2 22  19 - 32 (mEq/L)    Glucose, Bld 142 (*) 70 - 99 (mg/dL)    BUN 22  6 - 23 (mg/dL)    Creatinine, Ser 1.61 (*) 0.50 - 1.35 (mg/dL)    Calcium 9.3  8.4 - 10.5 (mg/dL)    GFR calc non Af Amer 39 (*) >90 (mL/min)    GFR calc Af Amer 45 (*) >90 (mL/min)   CBC     Status: Abnormal    Collection Time   02/19/11 11:25 PM      Component Value Range Comment   WBC 15.5 (*) 4.0 - 10.5 (K/uL)    RBC 4.99  4.22 - 5.81 (MIL/uL)    Hemoglobin 16.5  13.0 - 17.0 (g/dL)    HCT 09.6  04.5 - 40.9 (%)    MCV 92.2  78.0 - 100.0 (fL)    MCH 33.1  26.0 - 34.0 (pg)  MCHC 35.9  30.0 - 36.0 (g/dL)    RDW 16.1  09.6 - 04.5 (%)    Platelets 298  150 - 400 (K/uL)   DIFFERENTIAL     Status: Abnormal   Collection Time   02/19/11 11:25 PM      Component Value Range Comment   Neutrophils Relative 84 (*) 43 - 77 (%)    Neutro Abs 13.0 (*) 1.7 - 7.7 (K/uL)    Lymphocytes Relative 8 (*) 12 - 46 (%)    Lymphs Abs 1.3  0.7 - 4.0 (K/uL)    Monocytes Relative 8  3 - 12 (%)    Monocytes Absolute 1.2 (*) 0.1 - 1.0 (K/uL)    Eosinophils Relative 0  0 - 5 (%)    Eosinophils Absolute 0.0  0.0 - 0.7 (K/uL)    Basophils Relative 0  0 - 1 (%)    Basophils Absolute 0.0  0.0 - 0.1 (K/uL)   TYPE AND SCREEN     Status: Normal   Collection Time   02/19/11 11:30 PM      Component Value Range Comment   ABO/RH(D) A POS      Antibody Screen NEG      Sample Expiration 02/22/2011     ABO/RH     Status: Normal   Collection Time   02/19/11 11:30 PM      Component Value Range Comment   ABO/RH(D) A POS     POCT I-STAT, CHEM 8     Status: Abnormal   Collection Time   02/19/11 11:53 PM      Component Value Range Comment   Sodium 142  135 - 145 (mEq/L)    Potassium 4.3  3.5 - 5.1 (mEq/L)    Chloride 109  96 - 112 (mEq/L)    BUN 24 (*) 6 - 23 (mg/dL)    Creatinine, Ser 4.09 (*) 0.50 - 1.35 (mg/dL)    Glucose, Bld 811 (*) 70 - 99 (mg/dL)    Calcium, Ion 9.14  1.12 - 1.32 (mmol/L)    TCO2 23  0 - 100 (mmol/L)    Hemoglobin 17.0  13.0 - 17.0 (g/dL)    HCT 78.2  95.6 - 21.3 (%)   POCT I-STAT TROPONIN I     Status: Normal   Collection Time   02/20/11 12:14 AM      Component Value Range Comment   Troponin i, poc 0.00  0.00 - 0.08 (ng/mL)    Comment 3             Ct Angio Chest W/cm &/or Wo  Cm  02/20/2011  *RADIOLOGY REPORT*  Clinical Data:  Acute onset left-sided chest abdominal pain. Cyanosis of lower extremities.  Clinical suspicion for aortic dissection. Laryngeal carcinoma.  CT ANGIOGRAPHY CHEST, ABDOMEN AND PELVIS  Technique:  Multidetector CT imaging through the chest, abdomen and pelvis was performed using the standard protocol during bolus administration of intravenous contrast.  Multiplanar reconstructed images including MIPs were obtained and reviewed to evaluate the vascular anatomy.  Contrast: OMNIPAQUE IOHEXOL 350 MG/ML IV SOLN  Comparison:  None  CTA CHEST  Findings:  No evidence of thoracic aortic aneurysm or dissection. No evidence of pulmonary embolism. No mediastinal hematoma.  Mild cardiomegaly noted. No evidence of pleural or pericardial effusion. No evidence of mediastinal or hilar masses.  No lymphadenopathy seen elsewhere within the thorax.  Dependent atelectasis noted bilaterally, however there is no evidence of pulmonary infiltrate or mass.  No central endobronchial  lesions identified.  Mild right apical scarring and para septal emphysema noted.   Review of the MIP images confirms the above findings.  IMPRESSION:  1.  No evidence of thoracic aortic aneurysm or dissection.  To no evidence of pulmonary embolism. 3.  Mild cardiomegaly and dependent atelectasis.  CTA ABDOMEN AND PELVIS  Findings:  No evidence of abdominal aortic aneurysm or dissection. Mild to moderate atherosclerotic plaque noted.  No evidence of iliac artery aneurysm or dissection.  No evidence of retroperitoneal hemorrhage or hemoperitoneum.  The abdominal parenchymal organs are normal in appearance.  Tiny hiatal hernia noted.  No soft tissue masses or lymphadenopathy identified.  No evidence of inflammatory process or abnormal fluid collections.  Previous lower lumbar spine fusion noted.   Review of the MIP images confirms the above findings.  IMPRESSION:  1.  No evidence of abdominal aortic or iliac  artery aneurysm or dissection. No other acute findings. 2.  Tiny hiatal hernia.  Original Report Authenticated By: Danae Orleans, M.D.   Dg Chest Port 1 View  02/20/2011  *RADIOLOGY REPORT*  Clinical Data: Severe chest pain and shortness of breath.  PORTABLE CHEST - 1 VIEW  Comparison: Chest radiograph performed 02/19/2010  Findings: The lungs are well-aerated.  Mild bibasilar opacities likely reflect atelectasis.  There is no evidence of pleural effusion or pneumothorax.  The cardiomediastinal silhouette is mildly enlarged.  No acute osseous abnormalities are seen.  IMPRESSION:  1.  Mild bibasilar airspace opacities likely reflect atelectasis. 2.  Mild cardiomegaly noted.  Original Report Authenticated By: Tonia Ghent, M.D.   ECG on arrival: NSR with LAD. P mitrale in II. Normal intervals. Q waves III and  possibly aVF. Early R wave progression in V2. No frank ST segment deviations. T waves  all normal. In comparison to prior this R waves in V2 is new. Otherwise Q waves are  old.  Posterior leads ECG done on arrival (12/22, 404a) does not show posterior infarct or  process. ST segments all normal.  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   1. Chest pain: He has prior smoking, HTN as risk factors, but the nature of the chest pain  does not sound coronary in nature. It hurts worse when he moves at all, or moves his  shoulder, or when I press on his chest -- very atypical.   DDx includes pericarditis/myocarditis vs MSK/costochondritis vs GI related (h/o hiatal  hernia, chart shows unclear h/o esophageal stricture?). He also appears to have  inferior Q waves, and mild  cardiomegaly on CXR, which is odd because he is relatively  young and has no known reported h/o cardiac disease.   - Trend ECG and cardiac enzymes. ASA 325 and IV Metoprolol for now. Holding ACEi given  renal fxn, but continue home Nevibolol. No anticoagulation for now. Morphine pain  control (SL NTG has not worked) - 2d echo  - A1c, lipid panel   2. Acute renal failure: EPIC states pt has h/o glomerulonephritis/membranous nephropathy,  and pt states he's previusly seen Dr. Lacy Duverney for protein in urine. Previous Cr's  have been 1.2-1.5, but most recently 07/2010 was down to 0.96, so does appear to be in  failure at present.   - UNa, UCr, Uosm, UA, give overnight  fluids and trend BMET. HOld home ACEi  - If still elevated, consider nephrology consult, touch base with outpt nephrologist  re: what his actual diagnosis was.   3. Leukocytosis: Unclear. No infectious type prodrome to suggest acute infection. Could be  stress reaction to pain. Will do limited infxn w/u and monitor for fevers, blood  cultures if spikes fever.   4. Tachycardia: likely due to pain, will just monitor, look for underlying cause.   Telemetry, MC team 1 Full code, discussed   Other plans as per orders.  Vernadette Stutsman 02/20/2011, 4:09 AM     Addendum: Have requested bilateral arm blood pressures, per nursing they were not different

## 2011-02-20 NOTE — ED Notes (Addendum)
Dr Bebe Shaggy in to check on parient who is writhing in bed without relief.

## 2011-02-21 LAB — COMPREHENSIVE METABOLIC PANEL
Albumin: 2.8 g/dL — ABNORMAL LOW (ref 3.5–5.2)
BUN: 22 mg/dL (ref 6–23)
Chloride: 109 mEq/L (ref 96–112)
Creatinine, Ser: 1.32 mg/dL (ref 0.50–1.35)
GFR calc Af Amer: 68 mL/min — ABNORMAL LOW (ref >90)
Glucose, Bld: 132 mg/dL — ABNORMAL HIGH (ref 70–99)
Total Bilirubin: 0.4 mg/dL (ref 0.3–1.2)
Total Protein: 5.8 g/dL — ABNORMAL LOW (ref 6.0–8.3)

## 2011-02-21 LAB — GLUCOSE, CAPILLARY: Glucose-Capillary: 106 mg/dL — ABNORMAL HIGH (ref 70–99)

## 2011-02-21 LAB — CARDIAC PANEL(CRET KIN+CKTOT+MB+TROPI)
Relative Index: 0.7 (ref 0.0–2.5)
Total CK: 846 U/L — ABNORMAL HIGH (ref 7–232)

## 2011-02-21 MED ORDER — GUAIFENESIN 200 MG PO TABS
200.0000 mg | ORAL_TABLET | Freq: Four times a day (QID) | ORAL | Status: DC
  Administered 2011-02-21 – 2011-02-23 (×9): 200 mg via ORAL
  Filled 2011-02-21 (×12): qty 1

## 2011-02-21 MED ORDER — ALBUTEROL SULFATE HFA 108 (90 BASE) MCG/ACT IN AERS
2.0000 | INHALATION_SPRAY | Freq: Four times a day (QID) | RESPIRATORY_TRACT | Status: DC | PRN
  Filled 2011-02-21: qty 6.7

## 2011-02-21 MED ORDER — ALBUTEROL SULFATE HFA 108 (90 BASE) MCG/ACT IN AERS
1.0000 | INHALATION_SPRAY | Freq: Four times a day (QID) | RESPIRATORY_TRACT | Status: DC
  Administered 2011-02-21 (×2): 1 via RESPIRATORY_TRACT
  Filled 2011-02-21: qty 6.7

## 2011-02-21 NOTE — Progress Notes (Deleted)
Pt refuses CPAP at this time. Pt encouraged to call RT if pt changes mind. No distress noted.

## 2011-02-21 NOTE — Progress Notes (Signed)
Subjective: Still with significant pain. Coughing some. No dyspnea. Denies trauma or fall.  Objective: Filed Vitals:   02/20/11 1443 02/20/11 1940 02/21/11 0456 02/21/11 0500  BP: 149/90 161/97  165/95  Pulse: 111 100  86  Temp: 98.3 F (36.8 C) 98.5 F (36.9 C)  98 F (36.7 C)  TempSrc:  Oral  Oral  Resp: 18 16 19 18   Height:      Weight:      SpO2: 96% 93%  95%   Weight change:   Intake/Output Summary (Last 24 hours) at 02/21/11 0934 Last data filed at 02/21/11 0600  Gross per 24 hour  Intake    700 ml  Output   1275 ml  Net   -575 ml    General: Alert, awake, oriented x3, in no acute distress.  HEENT: No bruits, no goiter.  Heart: Regular rate and rhythm, without murmurs, rubs, gallops.  Lungs: CTA, bilateral air movement.  Abdomen: Soft, nontender, nondistended, positive bowel sounds.  Neuro: Grossly intact, nonfocal. Extremities; no edema.   Lab Results:  [facility] 02/20/11 2329 02/20/11 1622  NA 140 138  K 4.1 4.5  CL 109 108  CO2 21 17*  GLUCOSE 132* 139*  BUN 22 23  CREATININE 1.32 1.38*  CALCIUM 7.7* 8.1*  MG -- --  PHOS -- --    Basename 02/20/11 2329  AST 22  ALT 12  ALKPHOS 79  BILITOT 0.4  PROT 5.8*  ALBUMIN 2.8*   Basename 02/20/11 0600 02/19/11 2353 02/19/11 2325  WBC 8.3 -- 15.5*  NEUTROABS -- -- 13.0*  HGB 14.0 17.0 --  HCT 40.5 50.0 --  MCV 92.3 -- 92.2  PLT 223 -- 298    Basename 02/20/11 2328 02/20/11 1622 02/20/11 0841  CKTOTAL 846* 859* 671*  CKMB 5.8* 6.3* 6.6*  CKMBINDEX -- -- --  TROPONINI <0.30 <0.30 <0.30    Basename 02/20/11 0600  HGBA1C 5.4    Basename 02/20/11 0503  CHOL 289*  HDL 40  LDLCALC UNABLE TO CALCULATE IF TRIGLYCERIDE OVER 400 mg/dL  TRIG 409*  CHOLHDL 7.2  LDLDIRECT --    Micro Results: No results found for this or any previous visit (from the past 240 hour(s)).  Studies/Results: Dg Ribs Unilateral W/chest Left  02/20/2011  *RADIOLOGY REPORT*  Clinical Data: Chest pain, history of  carcinoma of the throat  LEFT RIBS AND CHEST - 3+ VIEW  Comparison: CT chest of 02/20/2011 and chest x-ray of 02/19/2011  Findings: There is cardiomegaly present with mild peribronchial thickening.  There may be minimal pulmonary vascular congestion on this poor inspiration film.  Left rib detail films show no acute abnormality.  IMPRESSION: 1.  Cardiomegaly with poor inspiration.  Cannot exclude mild pulmonary vascular congestion. 2.  Negative left rib detail.  Original Report Authenticated By: Juline Patch, M.D.   Ct Angio Chest W/cm &/or Wo Cm  02/21/2011  **ADDENDUM** CREATED: 02/21/2011 08:20:12  Not mentioned in the initial report, there are multiple left-sided rib fractures involving the left anterior sixth rib and the posterior seventh through tenth ribs on the left.  **END ADDENDUM** SIGNED BY: Aubery Lapping. [place], M.D.   02/21/2011  *RADIOLOGY REPORT*  Clinical Data:  Acute onset left-sided chest abdominal pain. Cyanosis of lower extremities.  Clinical suspicion for aortic dissection. Laryngeal carcinoma.  CT ANGIOGRAPHY CHEST, ABDOMEN AND PELVIS  Technique:  Multidetector CT imaging through the chest, abdomen and pelvis was performed using the standard protocol during bolus administration of intravenous contrast.  Multiplanar  reconstructed images including MIPs were obtained and reviewed to evaluate the vascular anatomy.  Contrast: OMNIPAQUE IOHEXOL 350 MG/ML IV SOLN  Comparison:  None  CTA CHEST  Findings:  No evidence of thoracic aortic aneurysm or dissection. No evidence of pulmonary embolism. No mediastinal hematoma.  Mild cardiomegaly noted. No evidence of pleural or pericardial effusion. No evidence of mediastinal or hilar masses.  No lymphadenopathy seen elsewhere within the thorax.  Dependent atelectasis noted bilaterally, however there is no evidence of pulmonary infiltrate or mass.  No central endobronchial lesions identified.  Mild right apical scarring and para septal emphysema noted.    Review of the MIP images confirms the above findings.  IMPRESSION:  1.  No evidence of thoracic aortic aneurysm or dissection.  To no evidence of pulmonary embolism. 3.  Mild cardiomegaly and dependent atelectasis.  CTA ABDOMEN AND PELVIS  Findings:  No evidence of abdominal aortic aneurysm or dissection. Mild to moderate atherosclerotic plaque noted.  No evidence of iliac artery aneurysm or dissection.  No evidence of retroperitoneal hemorrhage or hemoperitoneum.  The abdominal parenchymal organs are normal in appearance.  Tiny hiatal hernia noted.  No soft tissue masses or lymphadenopathy identified.  No evidence of inflammatory process or abnormal fluid collections.  Previous lower lumbar spine fusion noted.   Review of the MIP images confirms the above findings.  IMPRESSION:  1.  No evidence of abdominal aortic or iliac artery aneurysm or dissection. No other acute findings. 2.  Tiny hiatal hernia.  Original Report Authenticated By: Cyndie Chime, M.D.   Dg Chest Port 1 View  02/20/2011  *RADIOLOGY REPORT*  Clinical Data: Severe chest pain and shortness of breath.  PORTABLE CHEST - 1 VIEW  Comparison: Chest radiograph performed 02/19/2010  Findings: The lungs are well-aerated.  Mild bibasilar opacities likely reflect atelectasis.  There is no evidence of pleural effusion or pneumothorax.  The cardiomediastinal silhouette is mildly enlarged.  No acute osseous abnormalities are seen.  IMPRESSION:  1.  Mild bibasilar airspace opacities likely reflect atelectasis. 2.  Mild cardiomegaly noted.  Original Report Authenticated By: Tonia Ghent, M.D.    Medications: I have reviewed the patient's current medications.  Patient Active Hospital Problem List:   Chest pain (02/20/2011) Likely secondary to rib fracture. Continue with  Ms-contin, Protonix.  ECHO pending. Incentive spirometry. Albuterol for bronchitis. Guaifenesin for cough.  HYPERLIPIDEMIA (05/10/2007) Continue with tricor  .  HYPERTENSION  (05/10/2007) Continue with nebivlol.   CHRON GLOMERULONEPHRIT W/LES MEMBRANOUS GLN (05/10/2007)  HYPERGLYCEMIA (05/10/2007)  Monitor.  Renal insufficiency (02/20/2011)  Acute probably secondary to hypotension, prerenal. Cr decreased.  NSL. Cr at baseline.  DC home 12-24.     LOS: 2 days   Slevin Gunby M.D.  [facility] 02/21/2011, 9:34 AM

## 2011-02-21 NOTE — Progress Notes (Signed)
*  PRELIMINARY RESULTS* Echocardiogram 2D Echocardiogram has been performed.  Clide Deutscher RDCS 02/21/2011, 10:39 AM

## 2011-02-22 ENCOUNTER — Encounter (HOSPITAL_COMMUNITY): Payer: Self-pay | Admitting: Cardiology

## 2011-02-22 DIAGNOSIS — I472 Ventricular tachycardia, unspecified: Secondary | ICD-10-CM

## 2011-02-22 DIAGNOSIS — R079 Chest pain, unspecified: Secondary | ICD-10-CM

## 2011-02-22 LAB — BASIC METABOLIC PANEL
CO2: 27 mEq/L (ref 19–32)
Calcium: 9.1 mg/dL (ref 8.4–10.5)
Creatinine, Ser: 1.07 mg/dL (ref 0.50–1.35)
GFR calc non Af Amer: 76 mL/min — ABNORMAL LOW (ref >90)
Glucose, Bld: 102 mg/dL — ABNORMAL HIGH (ref 70–99)
Sodium: 139 mEq/L (ref 135–145)

## 2011-02-22 MED ORDER — HYDROCODONE-ACETAMINOPHEN 5-325 MG PO TABS: 1.0000 | ORAL_TABLET | Freq: Four times a day (QID) | ORAL | Status: AC | PRN

## 2011-02-22 MED ORDER — DSS 100 MG PO CAPS: 100.0000 mg | ORAL_CAPSULE | Freq: Two times a day (BID) | ORAL | Status: AC

## 2011-02-22 MED ORDER — MAGNESIUM SULFATE 40 MG/ML IJ SOLN
2.0000 g | Freq: Once | INTRAMUSCULAR | Status: AC
  Administered 2011-02-22: 2 g via INTRAVENOUS
  Filled 2011-02-22: qty 50

## 2011-02-22 MED ORDER — CYCLOBENZAPRINE HCL 5 MG PO TABS: 5.0000 mg | ORAL_TABLET | Freq: Three times a day (TID) | ORAL | Status: AC | PRN

## 2011-02-22 MED ORDER — AMLODIPINE BESYLATE 10 MG PO TABS
10.0000 mg | ORAL_TABLET | Freq: Every day | ORAL | Status: DC
  Administered 2011-02-22: 10 mg via ORAL
  Filled 2011-02-22 (×2): qty 1

## 2011-02-22 MED ORDER — ALBUTEROL SULFATE HFA 108 (90 BASE) MCG/ACT IN AERS: 2.0000 | INHALATION_SPRAY | Freq: Three times a day (TID) | RESPIRATORY_TRACT | Status: DC

## 2011-02-22 MED ORDER — GUAIFENESIN 200 MG PO TABS: 200.0000 mg | ORAL_TABLET | Freq: Four times a day (QID) | ORAL | Status: AC

## 2011-02-22 MED ORDER — MORPHINE SULFATE CR 15 MG PO TB12: 15.0000 mg | ORAL_TABLET | Freq: Two times a day (BID) | ORAL | Status: AC

## 2011-02-22 MED ORDER — METOPROLOL TARTRATE 50 MG PO TABS
50.0000 mg | ORAL_TABLET | Freq: Two times a day (BID) | ORAL | Status: DC
  Administered 2011-02-22: 50 mg via ORAL
  Filled 2011-02-22 (×3): qty 1

## 2011-02-22 MED ORDER — CYCLOBENZAPRINE HCL 10 MG PO TABS: 5.0000 mg | ORAL_TABLET | Freq: Three times a day (TID) | ORAL | Status: DC | PRN

## 2011-02-22 MED ORDER — AMLODIPINE BESYLATE 10 MG PO TABS: 10.0000 mg | ORAL_TABLET | Freq: Every day | ORAL | Status: DC

## 2011-02-22 MED ORDER — FENOFIBRATE 54 MG PO TABS: 54.0000 mg | ORAL_TABLET | Freq: Every day | ORAL | Status: DC

## 2011-02-22 MED ORDER — MAGNESIUM SULFATE 50 % IJ SOLN: 2.0000 g | Freq: Once | INTRAVENOUS | Status: DC

## 2011-02-22 NOTE — Progress Notes (Signed)
02/22/11 Dr. Sunnie Nielsen on floor showed her the strip with the 6 runs of v-tach  [address] RN,BSN , late note 12:39  pt had another run of v-tach non sustained called Dr. Sunnie Nielsen will put strip on chart pt alert and oriented VSS bp 156/117  /HTN 12:01 [address] [employer]
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
## 2011-02-23 MED ORDER — NEBIVOLOL HCL 10 MG PO TABS
10.0000 mg | ORAL_TABLET | Freq: Every day | ORAL | Status: DC
  Administered 2011-02-23: 10 mg via ORAL
  Filled 2011-02-23 (×2): qty 1

## 2011-02-23 MED ORDER — OLMESARTAN MEDOXOMIL 20 MG PO TABS
20.0000 mg | ORAL_TABLET | Freq: Every day | ORAL | Status: DC
  Administered 2011-02-23: 20 mg via ORAL
  Filled 2011-02-23 (×2): qty 1

## 2011-02-23 MED ORDER — OLMESARTAN MEDOXOMIL 20 MG PO TABS: 20.0000 mg | ORAL_TABLET | Freq: Every day | ORAL | Status: DC
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
## 2011-02-23 NOTE — Progress Notes (Signed)
Patient ID: Tony Hunt, male   DOB: [birth year], 55 y.o.   MRN: [id number] Subjective:  Denies chest pain. C/o feeling anxious.  Objective:  Vital Signs in the last 24 hours: Temp:  [98.4 F (36.9 C)-98.6 F (37 C)] 98.4 F (36.9 C) (12/24 2110) Pulse Rate:  [72-82] 72  (12/25 0620) Resp:  [18-20] 19  (12/25 0620) BP: (158-201)/(92-122) 177/110 mmHg (12/25 0629) SpO2:  [92 %-98 %] 95 % (12/25 0620)  Intake/Output from previous day:   Intake/Output from this shift:    Physical Exam: Well appearing NAD HEENT: Unremarkable Neck:  No JVD, no thyromegally Lungs:  Clear with no wheezes, rales, or rhonchi HEART:  Regular rate rhythm, no murmurs, no rubs, no clicks. Soft S4 is present. Abd:  Flat, positive bowel sounds, no organomegally, no rebound, no guarding Ext:  2 plus pulses, no edema, no cyanosis, no clubbing Skin:  No rashes no nodules Neuro:  CN II through XII intact, motor grossly intact  Lab Results: No results found for this basename: WBC:2,HGB:2,PLT:2 in the last 72 hours  Basename 02/22/11 0940 02/20/11 2329  NA 139 140  K 3.9 4.1  CL 104 109  CO2 27 21  GLUCOSE 102* 132*  BUN 12 22  CREATININE 1.07 1.32    Basename 02/20/11 2328 02/20/11 1622  TROPONINI <0.30 <0.30   Hepatic Function Panel  Basename 02/20/11 2329  PROT 5.8*  ALBUMIN 2.8*  AST 22  ALT 12  ALKPHOS 79  BILITOT 0.4  BILIDIR --  IBILI --   No results found for this basename: CHOL in the last 72 hours No results found for this basename: PROTIME in the last 72 hours  Imaging: No results found.  Cardiac Studies:  Assessment/Plan:  1.  Chest pain - resolved. Agree with Dr. Ludwig Clarks assessment. Appears to be musculo-skeletal. Treat as needed with NSAIDS/narcotics. 2. HTN - his blood pressure is elelvated. He was previously on Nebivolol 10 mg daily and Olmesartan 20 mg daily with good control by his report. Will restart and stop amlodipine. If his blood pressure improves, he could be  discharged later today. 3. NSVT - none since noon yesterday. With normal LV function, beta blocker alone she be sufficient to treat this.  LOS: 4 days    Lewayne Bunting 02/23/2011, 6:38 AM

## 2011-02-26 NOTE — Progress Notes (Signed)
Utilization Review Completed.Tony Hunt T12/28/2012   

## 2011-04-07 ENCOUNTER — Telehealth: Payer: Self-pay

## 2011-04-07 NOTE — Telephone Encounter (Signed)
Pt was seen by the [employer] service at [employer]. [facility], he was told to follow up w/ PCP. Pt was contacted for follow up., Pt refused, says he did not have any ins., and he would call back to schedule.   Called pt today, left message. Mailed Information about Paying your bill and the different assistance programs offered to pt.Marland KitchenMarland Kitchen

## 2011-04-07 NOTE — Telephone Encounter (Signed)
Noted  

## 2011-05-26 NOTE — Progress Notes (Signed)
Received progress notes from Dr. Karol Wolicki @ [facility]; forwarded to Dr. Ha. 

## 2011-06-15 ENCOUNTER — Other Ambulatory Visit: Payer: Self-pay | Admitting: Otolaryngology

## 2011-06-15 DIAGNOSIS — Z859 Personal history of malignant neoplasm, unspecified: Secondary | ICD-10-CM

## 2011-06-15 DIAGNOSIS — R490 Dysphonia: Secondary | ICD-10-CM

## 2011-06-17 ENCOUNTER — Other Ambulatory Visit: Payer: Self-pay

## 2011-06-30 ENCOUNTER — Ambulatory Visit
Admission: RE | Admit: 2011-06-30 | Discharge: 2011-06-30 | Disposition: A | Discharge status: Home / Self Care | Payer: Medicare Other | Source: Ambulatory Visit | Attending: Otolaryngology | Admitting: Otolaryngology

## 2011-06-30 DIAGNOSIS — Z859 Personal history of malignant neoplasm, unspecified: Secondary | ICD-10-CM

## 2011-06-30 DIAGNOSIS — R490 Dysphonia: Secondary | ICD-10-CM

## 2011-06-30 MED ORDER — IOHEXOL 300 MG/ML  SOLN
75.0000 mL | Freq: Once | INTRAMUSCULAR | Status: AC | PRN
  Administered 2011-06-30: 75 mL via INTRAVENOUS

## 2011-07-06 ENCOUNTER — Encounter: Payer: Self-pay | Admitting: Family Medicine

## 2011-07-06 ENCOUNTER — Ambulatory Visit (INDEPENDENT_AMBULATORY_CARE_PROVIDER_SITE_OTHER): Payer: Medicare Other | Admitting: Family Medicine

## 2011-07-06 VITALS — BP 122/70 | HR 71 | Temp 98.7°F | Wt 191.8 lb

## 2011-07-06 DIAGNOSIS — C329 Malignant neoplasm of larynx, unspecified: Secondary | ICD-10-CM

## 2011-07-06 DIAGNOSIS — N059 Unspecified nephritic syndrome with unspecified morphologic changes: Secondary | ICD-10-CM

## 2011-07-06 DIAGNOSIS — E7800 Pure hypercholesterolemia, unspecified: Secondary | ICD-10-CM

## 2011-07-06 DIAGNOSIS — I10 Essential (primary) hypertension: Secondary | ICD-10-CM

## 2011-07-06 DIAGNOSIS — E785 Hyperlipidemia, unspecified: Secondary | ICD-10-CM

## 2011-07-06 DIAGNOSIS — N032 Chronic nephritic syndrome with diffuse membranous glomerulonephritis: Secondary | ICD-10-CM

## 2011-07-06 MED ORDER — METOPROLOL SUCCINATE ER 25 MG PO TB24: 25.0000 mg | ORAL_TABLET | Freq: Every day | ORAL | Status: DC

## 2011-07-06 MED ORDER — OMEPRAZOLE 20 MG PO CPDR: 20.0000 mg | DELAYED_RELEASE_CAPSULE | Freq: Every day | ORAL | Status: DC

## 2011-07-06 MED ORDER — LISINOPRIL 20 MG PO TABS: 20.0000 mg | ORAL_TABLET | Freq: Every day | ORAL | Status: DC

## 2011-07-06 NOTE — Patient Instructions (Addendum)
See Shirlee Limerick about your referral before you leave today. Come back for fasting labs.   Take care.   Call the ENT clinic if you don't hear about the CT.
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
## 2011-07-07 ENCOUNTER — Other Ambulatory Visit: Payer: Self-pay | Admitting: Otolaryngology

## 2011-07-07 DIAGNOSIS — C329 Malignant neoplasm of larynx, unspecified: Secondary | ICD-10-CM | POA: Insufficient documentation

## 2011-07-07 DIAGNOSIS — Z859 Personal history of malignant neoplasm, unspecified: Secondary | ICD-10-CM

## 2011-07-07 DIAGNOSIS — R490 Dysphonia: Secondary | ICD-10-CM

## 2011-07-07 NOTE — Assessment & Plan Note (Signed)
Cr back to ~1, refer back to renal for eval.

## 2011-07-07 NOTE — Assessment & Plan Note (Signed)
He'll work on diet and return for fasting lipids.

## 2011-07-07 NOTE — Assessment & Plan Note (Signed)
Controlled, continue current meds.   

## 2011-07-07 NOTE — Assessment & Plan Note (Signed)
Per ENT

## 2011-07-12 ENCOUNTER — Other Ambulatory Visit (INDEPENDENT_AMBULATORY_CARE_PROVIDER_SITE_OTHER): Payer: Medicare Other

## 2011-07-12 DIAGNOSIS — E7800 Pure hypercholesterolemia, unspecified: Secondary | ICD-10-CM

## 2011-07-12 LAB — LIPID PANEL
HDL: 36.9 mg/dL — ABNORMAL LOW (ref >39.00)
Total CHOL/HDL Ratio: 9
Triglycerides: 408 mg/dL — ABNORMAL HIGH (ref 0.0–149.0)

## 2011-07-12 LAB — LDL CHOLESTEROL, DIRECT: Direct LDL: 176.8 mg/dL

## 2011-07-14 ENCOUNTER — Other Ambulatory Visit: Payer: Self-pay | Admitting: Family Medicine

## 2011-07-14 DIAGNOSIS — E7800 Pure hypercholesterolemia, unspecified: Secondary | ICD-10-CM

## 2011-07-20 ENCOUNTER — Encounter: Payer: Self-pay | Admitting: *Deleted

## 2011-07-20 ENCOUNTER — Other Ambulatory Visit: Payer: Self-pay | Admitting: *Deleted

## 2011-07-20 MED ORDER — SIMVASTATIN 40 MG PO TABS: 40.0000 mg | ORAL_TABLET | Freq: Every day | ORAL | Status: DC

## 2011-08-02 ENCOUNTER — Telehealth: Payer: Self-pay | Admitting: *Deleted

## 2011-08-02 NOTE — Telephone Encounter (Signed)
Patient advised.  Labs already scheduled in July.

## 2011-08-02 NOTE — Telephone Encounter (Signed)
In that case no OV and I added it to allergy list.  I would not try a replacement medicine given the profound edema.  The risk of a similar reaction would be elevated.  I would just work on diet and exercise and recheck lipids in a few months.  He can use the future order that is in.  Thanks.

## 2011-08-02 NOTE — Telephone Encounter (Signed)
Stop the simvastatin and notify us if the symptoms improve; we can change meds at that point if better.  If not improved, then he'll need OV.

## 2011-08-02 NOTE — Telephone Encounter (Signed)
He states the swelling was profound last week, therefore he stopped the medication on Friday.  However, he is feeling much better now and swelling is gone.  Does he still need an OV this week?

## 2011-08-02 NOTE — Telephone Encounter (Signed)
Patient states he actually stopped taking the Simvastatin on Friday and his swelling has already subsided and he feels much better.  He said his stomach had swollen so bad he could hardly breathe.

## 2011-08-02 NOTE — Telephone Encounter (Signed)
If he has than much dyspnea and swelling, then he needs OV to discuss options.  If profoundly SOB, then to ER.  If he's getting better, it isn't urgent so he could be schedule this week.  Thanks.

## 2011-08-02 NOTE — Telephone Encounter (Signed)
(  taken off voicemail) Pt wants to d/c chol med, he states that it has caused swelling and it's becoming unbearable, wants to change meds

## 2011-08-16 ENCOUNTER — Other Ambulatory Visit: Payer: Self-pay | Admitting: *Deleted

## 2011-08-16 MED ORDER — METOPROLOL SUCCINATE ER 25 MG PO TB24: 25.0000 mg | ORAL_TABLET | Freq: Every day | ORAL | Status: DC

## 2011-08-16 MED ORDER — LISINOPRIL 20 MG PO TABS: 20.0000 mg | ORAL_TABLET | Freq: Every day | ORAL | Status: DC

## 2011-08-16 MED ORDER — OMEPRAZOLE 20 MG PO CPDR: 20.0000 mg | DELAYED_RELEASE_CAPSULE | Freq: Every day | ORAL | Status: DC

## 2011-08-25 ENCOUNTER — Encounter: Payer: Self-pay | Admitting: Family Medicine

## 2011-08-25 ENCOUNTER — Ambulatory Visit (INDEPENDENT_AMBULATORY_CARE_PROVIDER_SITE_OTHER): Payer: Medicare Other | Admitting: Family Medicine

## 2011-08-25 VITALS — BP 108/52 | HR 58 | Temp 98.5°F | Ht 67.0 in | Wt 194.5 lb

## 2011-08-25 DIAGNOSIS — L237 Allergic contact dermatitis due to plants, except food: Secondary | ICD-10-CM

## 2011-08-25 DIAGNOSIS — L255 Unspecified contact dermatitis due to plants, except food: Secondary | ICD-10-CM

## 2011-08-25 MED ORDER — PREDNISONE 20 MG PO TABS: ORAL_TABLET | ORAL | Status: AC

## 2011-08-25 MED ORDER — METHYLPREDNISOLONE ACETATE 40 MG/ML IJ SUSP
80.0000 mg | Freq: Once | INTRAMUSCULAR | Status: AC
  Administered 2011-08-25: 80 mg via INTRAMUSCULAR

## 2011-08-25 NOTE — Progress Notes (Signed)
   Nature conservation officer at [facility] [address] [place] [address] Phone: [phone] Fax: [phone]   Patient Name: Tony Hunt Date of Birth: [birth year] Age: 56 y.o. Medical Record Number: [id number] Gender: male Date of Encounter: 08/25/2011  History of Present Illness:  Tony Hunt is a 56 y.o. very pleasant male patient who presents with the following:  Poison Ivy:  Pleasant patient with severe lower extremity poison ivy dermatitis, it is been ongoing for the last 1-2 days. He was unable to sleep at all last night and is in a great deal of distress from this. In the past, he has had to use some systemic steroids.  Past Medical History, Surgical History, Social History, Family History, Problem List, Medications, and Allergies have been reviewed and updated if relevant.  Prior to Admission medications   Medication Sig Start Date End Date Taking? Authorizing Provider  lisinopril (PRINIVIL,ZESTRIL) 20 MG tablet Take 1 tablet (20 mg total) by mouth daily. 08/16/11  Yes Joaquim Nam, MD  metoprolol succinate (TOPROL-XL) 25 MG 24 hr tablet Take 1 tablet (25 mg total) by mouth daily. 08/16/11  Yes Joaquim Nam, MD  omeprazole (PRILOSEC) 20 MG capsule Take 1 capsule (20 mg total) by mouth daily. 08/16/11  Yes Joaquim Nam, MD    Review of Systems:  GEN: No acute illnesses, no fevers, chills. GI: No n/v/d, eating normally Pulm: No SOB Interactive and getting along well at home.  Otherwise, ROS is as per the HPI.   Physical Examination: Filed Vitals:   08/25/11 1551  BP: 108/52  Pulse: 58  Temp: 98.5 F (36.9 C)   Filed Vitals:   08/25/11 1551  Height: 5\' 7"  (1.702 m)  Weight: 194 lb 8 oz (88.225 kg)   Body mass index is 30.46 kg/(m^2). Ideal Body Weight: Weight in (lb) to have BMI = 25: 159.3    GEN: WDWN, NAD, Non-toxic, Alert & Oriented x 3 HEENT: Atraumatic, Normocephalic.  Ears and Nose: No external deformity. EXTR: No  clubbing/cyanosis/edema NEURO: Normal gait.  PSYCH: Normally interactive. Conversant. Not depressed or anxious appearing.  Calm demeanor.  SKIN: highly pruritic, vesicular lesions on the patient's distal lower extremities bilaterally  Assessment and Plan:  1. Poison ivy  methylPREDNISolone acetate (DEPO-MEDROL) injection 80 mg   I discussed with the patient potential risk, but he would like to proceed with systemic therapy as soon as possible. Therefore, we gave him 80 mg of Depo-Medrol in the office, and gave him a prednisone taper to take over the next 2 weeks.  Orders Today: No orders of the defined types were placed in this encounter.    Medications Today: Meds ordered this encounter  Medications  . predniSONE (DELTASONE) 20 MG tablet    Sig: 2 tabs po daily for 1 week, then 1 po daily for 1 week    Dispense:  21 tablet    Refill:  0  . methylPREDNISolone acetate (DEPO-MEDROL) injection 80 mg    Sig:      Hannah Beat, MD

## 2011-09-01 ENCOUNTER — Other Ambulatory Visit (INDEPENDENT_AMBULATORY_CARE_PROVIDER_SITE_OTHER): Payer: Medicare Other

## 2011-09-01 DIAGNOSIS — E7800 Pure hypercholesterolemia, unspecified: Secondary | ICD-10-CM

## 2011-09-01 LAB — LIPID PANEL
Cholesterol: 317 mg/dL — ABNORMAL HIGH (ref 0–200)
VLDL: 73.4 mg/dL — ABNORMAL HIGH (ref 0.0–40.0)

## 2011-09-01 LAB — HEPATIC FUNCTION PANEL
ALT: 15 U/L (ref 0–53)
AST: 11 U/L (ref 0–37)
Albumin: 3.7 g/dL (ref 3.5–5.2)
Alkaline Phosphatase: 60 U/L (ref 39–117)
Bilirubin, Direct: 0 mg/dL (ref 0.0–0.3)
Total Protein: 6.6 g/dL (ref 6.0–8.3)

## 2011-10-15 ENCOUNTER — Telehealth: Payer: Self-pay | Admitting: Family Medicine

## 2011-10-15 ENCOUNTER — Encounter: Payer: Self-pay | Admitting: Family Medicine

## 2011-10-15 ENCOUNTER — Ambulatory Visit: Payer: Self-pay | Admitting: Family Medicine

## 2011-10-15 ENCOUNTER — Ambulatory Visit (INDEPENDENT_AMBULATORY_CARE_PROVIDER_SITE_OTHER): Payer: Medicare Other | Admitting: Family Medicine

## 2011-10-15 VITALS — BP 120/72 | HR 72 | Temp 97.9°F | Wt 193.0 lb

## 2011-10-15 DIAGNOSIS — L255 Unspecified contact dermatitis due to plants, except food: Secondary | ICD-10-CM

## 2011-10-15 MED ORDER — PREDNISONE 20 MG PO TABS: 60.0000 mg | ORAL_TABLET | Freq: Every day | ORAL | Status: AC

## 2011-10-15 MED ORDER — METHYLPREDNISOLONE ACETATE 40 MG/ML IJ SUSP
40.0000 mg | Freq: Once | INTRAMUSCULAR | Status: AC
  Administered 2011-10-15: 40 mg via INTRAMUSCULAR

## 2011-10-15 NOTE — Progress Notes (Signed)
  Subjective:     Tony Hunt is a 56 y.o. male who presents for evaluation of a rash involving the lower extremity and trunk. Rash started 3 days ago. Lesions are pink, and raised, no blisters in texture. Rash has changed over time. Rash is pruritic. Associated symptoms: none, no difficulty breathing, no lip or tounge swelling. Patient denies: abdominal pain, sore throat, fever. Patient has not had contacts with similar rash. Patient has had new exposures, went fishing in woods night before rash appeared. The following portions of the patient's history were reviewed and updated as appropriate: allergies, current medications, past family history, past medical history, past social history, past surgical history and problem list.  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  Review of Systems Pertinent items are noted in HPI.    Objective:    BP 120/72  Pulse 72  Temp 97.9 F (36.6 C)  Wt 193 lb (87.544 kg) General:  alert  Skin:  lesion noted on extremities and trunk, erythematous blistering papules, no excoriation pattern. Pt also report lesions on groin as he urinated outside.     Assessment:    contact dermatitis: plants poison ivy  versus chigger bites  Plan:    Medications: steroids: IM and oral taper given pt feels this is how he has presented with contact derm in past and it will get much worse if he does not get IM steroids ASAP.Marland Kitchen Follow up in as needed if rash not resolving. weeks.

## 2011-10-15 NOTE — Telephone Encounter (Signed)
Caller: Tony Hunt/Patient; Patient Name: Tony Hunt; PCP: [name] Clelia Croft); Best Callback Phone Number: [phone].  Patient calling about poison oak.  Onset of severe itching and rash to ankles 10/12/11 and has not improved with home care measures.  States in the recent past he has had to have an injection and a course of steroids, and is afraid he needs steroids again.  Per protocol, emergent symptoms denied; advised appointment within 24 hours.  Appointment scheduled in Epic already for 1430 10/15/11; patient requests appointment sooner with first available provider, even if not with Dr. Para March.  Appointment scheduled 1115 10/15/11 with Dr. Ermalene Searing.

## 2011-10-15 NOTE — Patient Instructions (Addendum)
Complete oral steroid dose pack. Call if ras not improving as expected.

## 2012-02-03 ENCOUNTER — Other Ambulatory Visit: Payer: Self-pay | Admitting: Otolaryngology

## 2012-02-03 DIAGNOSIS — R490 Dysphonia: Secondary | ICD-10-CM

## 2012-02-07 ENCOUNTER — Ambulatory Visit
Admission: RE | Admit: 2012-02-07 | Discharge: 2012-02-07 | Disposition: A | Discharge status: Home / Self Care | Payer: Medicare Other | Source: Ambulatory Visit | Attending: Otolaryngology | Admitting: Otolaryngology

## 2012-02-07 DIAGNOSIS — R490 Dysphonia: Secondary | ICD-10-CM

## 2012-02-07 MED ORDER — IOHEXOL 300 MG/ML  SOLN
75.0000 mL | Freq: Once | INTRAMUSCULAR | Status: AC | PRN
  Administered 2012-02-07: 75 mL via INTRAVENOUS

## 2012-03-06 ENCOUNTER — Encounter: Payer: Self-pay | Admitting: Family Medicine

## 2012-03-06 ENCOUNTER — Ambulatory Visit (INDEPENDENT_AMBULATORY_CARE_PROVIDER_SITE_OTHER): Payer: Medicare Other | Admitting: Family Medicine

## 2012-03-06 VITALS — BP 122/72 | HR 75 | Temp 98.0°F | Wt 202.8 lb

## 2012-03-06 DIAGNOSIS — Z23 Encounter for immunization: Secondary | ICD-10-CM

## 2012-03-06 DIAGNOSIS — R809 Proteinuria, unspecified: Secondary | ICD-10-CM

## 2012-03-06 DIAGNOSIS — F410 Panic disorder [episodic paroxysmal anxiety] without agoraphobia: Secondary | ICD-10-CM

## 2012-03-06 DIAGNOSIS — N032 Chronic nephritic syndrome with diffuse membranous glomerulonephritis: Secondary | ICD-10-CM

## 2012-03-06 LAB — COMPREHENSIVE METABOLIC PANEL
Albumin: 3.7 g/dL (ref 3.5–5.2)
BUN: 20 mg/dL (ref 6–23)
CO2: 26 mEq/L (ref 19–32)
Calcium: 9.6 mg/dL (ref 8.4–10.5)
GFR: 67.45 mL/min (ref >60.00)
Glucose, Bld: 90 mg/dL (ref 70–99)
Potassium: 4.6 mEq/L (ref 3.5–5.1)
Sodium: 137 mEq/L (ref 135–145)
Total Protein: 7.5 g/dL (ref 6.0–8.3)

## 2012-03-06 LAB — POCT URINALYSIS DIPSTICK
Blood, UA: NEGATIVE
Ketones, UA: NEGATIVE
Spec Grav, UA: 1.02
Urobilinogen, UA: NEGATIVE
pH, UA: 6

## 2012-03-06 MED ORDER — METOPROLOL SUCCINATE ER 25 MG PO TB24: 50.0000 mg | ORAL_TABLET | Freq: Every day | ORAL | Status: DC

## 2012-03-06 NOTE — Patient Instructions (Signed)
Take the metoprolol twice a day and see if that helps with the panic symptoms.  Either way, let me know in about 2 weeks.   Go to the lab on the way out.  We'll contact you with your lab report.  Take care.

## 2012-03-06 NOTE — Progress Notes (Signed)
Swallowing is at baseline.  He had f/u CT per ENT 12/13 w/o recurrence.    H/o glomerulonephritis and protein noted in urine on outside check at work.  Had seen renal MD last year ([facility] in [place]).  No edema.  Hasn't had recent Cr done.    He's having weekly panic symptoms.  TSH wnl per patient at ENT clinic. He is on BB already.  Mother died in the last year, inc in sx since then. Episodes usually last a few minutes.    Meds, vitals, and allergies reviewed.   ROS: See HPI.  Otherwise, noncontributory.  nad ncat Neck supple and w/o LA rrr ctab abd soft Ext w/o edema Speech and judgement wnl

## 2012-03-07 DIAGNOSIS — F410 Panic disorder [episodic paroxysmal anxiety] without agoraphobia: Secondary | ICD-10-CM | POA: Insufficient documentation

## 2012-03-07 NOTE — Assessment & Plan Note (Signed)
With proteinuria noted, will d/w renal MD.  Cr wnl in meantime.  See notes on labs.

## 2012-03-07 NOTE — Assessment & Plan Note (Signed)
Episodic, likely exacerbated by death of mother and associated stressors.  No SI/HI.  Okay for outpatient f/u.  Would use 50mg  metoprolol in meantime to see if this helps.  He agrees.  Call back prn.

## 2012-03-10 ENCOUNTER — Telehealth: Payer: Self-pay | Admitting: Family Medicine

## 2012-03-10 NOTE — Telephone Encounter (Signed)
I talked to Dr. Kathrene Bongo.  Notify pt that the renal clinic will be in touch with him about some f/u labs.  Renal will handle this.  Thanks.

## 2012-03-10 NOTE — Telephone Encounter (Signed)
Patient advised.

## 2012-04-05 ENCOUNTER — Telehealth: Payer: Self-pay

## 2012-04-05 MED ORDER — METOPROLOL TARTRATE 25 MG PO TABS: 25.0000 mg | ORAL_TABLET | Freq: Two times a day (BID) | ORAL | Status: DC

## 2012-04-05 NOTE — Telephone Encounter (Signed)
Pt left v/m requesting substitute for metoprolol 25 mg taking one tab daily; insurance will not pay for metoprolol.[facility] [address].Please advise.

## 2012-04-05 NOTE — Telephone Encounter (Signed)
The BID dosing version should be covered.  Sent rx for metoprolol 25 mg bid.  Thanks.

## 2012-04-06 NOTE — Telephone Encounter (Signed)
Patient advised.

## 2012-05-01 ENCOUNTER — Ambulatory Visit (INDEPENDENT_AMBULATORY_CARE_PROVIDER_SITE_OTHER): Payer: Medicare Other | Admitting: Family Medicine

## 2012-05-01 ENCOUNTER — Encounter: Payer: Self-pay | Admitting: Family Medicine

## 2012-05-01 VITALS — BP 108/72 | HR 64 | Temp 98.4°F | Ht 68.0 in | Wt 205.8 lb

## 2012-05-01 DIAGNOSIS — Z029 Encounter for administrative examinations, unspecified: Secondary | ICD-10-CM

## 2012-05-01 DIAGNOSIS — I10 Essential (primary) hypertension: Secondary | ICD-10-CM

## 2012-05-01 DIAGNOSIS — Z0289 Encounter for other administrative examinations: Secondary | ICD-10-CM

## 2012-05-01 DIAGNOSIS — Z024 Encounter for examination for driving license: Secondary | ICD-10-CM

## 2012-05-01 LAB — POCT URINALYSIS DIPSTICK
Leukocytes, UA: NEGATIVE
Nitrite, UA: NEGATIVE
Urobilinogen, UA: NEGATIVE
pH, UA: 6

## 2012-05-01 NOTE — Patient Instructions (Addendum)
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  Fill out your part and sign the card.  I'll work on the rest.  Take care.   Schedule a physical for later in 2014.  Glad to see you.

## 2012-05-01 NOTE — Progress Notes (Signed)
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  He will pick it up and bring it in, advised patient to be sure to fill out his portion before leaving the paperwork.  Hearing, vision and urinalysis done.  Ninetta Lights, CMA  DOT physical.  See papers when filled out and scanned, as above.   20/20 B eyes, can see color.  Has B vision.  Hearing screen- 25dB in all ranges B  Hypertension:    Using medication without problems or lightheadedness: y Chest pain with exertion:n Edema:n Short of breath:n  PMH and SH reviewed  Meds, vitals, and allergies reviewed.   ROS: See HPI.  Otherwise negative.    GEN: nad, alert and oriented HEENT: mucous membranes moist, tm wnl, nasal and OP exam wnl. PERRL, EOMI NECK: supple w/o LA CV: rrr. PULM: ctab, no inc wob ABD: soft, +bs EXT: no edema SKIN: no acute rash CN 2-12 wnl B, S/S/DTR wnl x4 No hernia.  Normal ROM of back and ext x4

## 2012-05-02 ENCOUNTER — Encounter: Payer: Self-pay | Admitting: Family Medicine

## 2012-05-02 DIAGNOSIS — Z024 Encounter for examination for driving license: Secondary | ICD-10-CM | POA: Insufficient documentation

## 2012-05-02 NOTE — Assessment & Plan Note (Signed)
No contraindication to driving based on exam or history.  See scanned forms, when he drops those off.  Should qualify for 1 year from today.

## 2012-05-02 NOTE — Assessment & Plan Note (Signed)
Controlled, protein in urine noted and he has had f/u with renal.  On ACE.  No contraindication to driving based on exam or history.  See scanned forms.

## 2012-05-16 NOTE — Progress Notes (Signed)
Received office notes from Dr. Zola Button [facility] @ [facility]; forwarded to Dr. Gaylyn Rong.

## 2012-08-23 ENCOUNTER — Emergency Department (HOSPITAL_COMMUNITY)
Admission: EM | Admit: 2012-08-23 | Discharge: 2012-08-23 | Disposition: A | Discharge status: Home / Self Care | Payer: Medicare Other | Source: Home / Self Care | Attending: Emergency Medicine | Admitting: Emergency Medicine

## 2012-08-23 ENCOUNTER — Emergency Department (HOSPITAL_COMMUNITY): Payer: Medicare Other

## 2012-08-23 ENCOUNTER — Encounter (HOSPITAL_COMMUNITY): Payer: Self-pay | Admitting: *Deleted

## 2012-08-23 DIAGNOSIS — R0789 Other chest pain: Secondary | ICD-10-CM

## 2012-08-23 DIAGNOSIS — Z87448 Personal history of other diseases of urinary system: Secondary | ICD-10-CM | POA: Insufficient documentation

## 2012-08-23 DIAGNOSIS — I10 Essential (primary) hypertension: Secondary | ICD-10-CM | POA: Insufficient documentation

## 2012-08-23 DIAGNOSIS — G4730 Sleep apnea, unspecified: Secondary | ICD-10-CM | POA: Diagnosis present

## 2012-08-23 DIAGNOSIS — Z7982 Long term (current) use of aspirin: Secondary | ICD-10-CM | POA: Insufficient documentation

## 2012-08-23 DIAGNOSIS — E785 Hyperlipidemia, unspecified: Secondary | ICD-10-CM | POA: Diagnosis present

## 2012-08-23 DIAGNOSIS — Z85819 Personal history of malignant neoplasm of unspecified site of lip, oral cavity, and pharynx: Secondary | ICD-10-CM | POA: Insufficient documentation

## 2012-08-23 DIAGNOSIS — I2510 Atherosclerotic heart disease of native coronary artery without angina pectoris: Principal | ICD-10-CM | POA: Diagnosis present

## 2012-08-23 DIAGNOSIS — K219 Gastro-esophageal reflux disease without esophagitis: Secondary | ICD-10-CM | POA: Diagnosis present

## 2012-08-23 DIAGNOSIS — I200 Unstable angina: Secondary | ICD-10-CM | POA: Diagnosis present

## 2012-08-23 DIAGNOSIS — Z8659 Personal history of other mental and behavioral disorders: Secondary | ICD-10-CM | POA: Insufficient documentation

## 2012-08-23 DIAGNOSIS — Z79899 Other long term (current) drug therapy: Secondary | ICD-10-CM | POA: Insufficient documentation

## 2012-08-23 DIAGNOSIS — R7309 Other abnormal glucose: Secondary | ICD-10-CM | POA: Diagnosis not present

## 2012-08-23 DIAGNOSIS — Z8679 Personal history of other diseases of the circulatory system: Secondary | ICD-10-CM | POA: Insufficient documentation

## 2012-08-23 DIAGNOSIS — Z87891 Personal history of nicotine dependence: Secondary | ICD-10-CM

## 2012-08-23 DIAGNOSIS — Z8669 Personal history of other diseases of the nervous system and sense organs: Secondary | ICD-10-CM | POA: Insufficient documentation

## 2012-08-23 DIAGNOSIS — E039 Hypothyroidism, unspecified: Secondary | ICD-10-CM | POA: Diagnosis present

## 2012-08-23 DIAGNOSIS — Z8719 Personal history of other diseases of the digestive system: Secondary | ICD-10-CM | POA: Insufficient documentation

## 2012-08-23 LAB — BASIC METABOLIC PANEL
BUN: 14 mg/dL (ref 6–23)
CO2: 23 mEq/L (ref 19–32)
Calcium: 9.2 mg/dL (ref 8.4–10.5)
Glucose, Bld: 95 mg/dL (ref 70–99)
Potassium: 4.2 mEq/L (ref 3.5–5.1)
Sodium: 139 mEq/L (ref 135–145)

## 2012-08-23 LAB — CBC
HCT: 39.6 % (ref 39.0–52.0)
Hemoglobin: 13.8 g/dL (ref 13.0–17.0)
MCH: 30.5 pg (ref 26.0–34.0)
RBC: 4.53 MIL/uL (ref 4.22–5.81)

## 2012-08-23 LAB — POCT I-STAT TROPONIN I: Troponin i, poc: 0.01 ng/mL (ref 0.00–0.08)

## 2012-08-23 MED ORDER — IBUPROFEN 800 MG PO TABS: 800.0000 mg | ORAL_TABLET | Freq: Three times a day (TID) | ORAL | Status: DC | PRN

## 2012-08-23 NOTE — ED Notes (Signed)
Pt c/o CP that started yesterday but became worse today. Pain starts on left upper chest and radiates into left neck. Pt denies cardiac hx. Pt denies taking ASA or nitro today, hasn't taken anything for the pain yet. Pt denies cough/fever at home. Pt c/o SOB that started a few hrs ago, denies recent trips. Pt's voice is hoarse, sts had radiation a few years ago and his voice gets that this often. Pt in nad, skin warm and dry, resp e/u.

## 2012-08-23 NOTE — ED Notes (Signed)
Pt placed on monitor.  

## 2012-08-23 NOTE — ED Notes (Signed)
Pt in c/o left sided chest pain that radiates into his neck and left arm, c/o left hand tingling, states pain started this am and has been constant since that time, c/o dizziness but denies n/v, c/o shortness of breath

## 2012-08-23 NOTE — ED Notes (Signed)
Pt given pillow for his back because he stated he was starting to have pains in his back from a "very long ago" surgery

## 2012-08-23 NOTE — ED Provider Notes (Signed)
The patient had 4 or 5 episodes of left anterior chest pain today each lasting 1 minute or less. He states pain was nonradiating. No shortness of breath no nausea no dizziness. He is presently asymptomatic. Cardiac risk factors male gender age otherwise negative. On exam no distress lungs clear auscultation heart regular rate and rhythm extremities without edema.  Medical decision making symptoms atypical for acute coronary syndrome. I feel the patient can have outpatient cardiac workup  Doug Sou, MD 08/23/12 2023

## 2012-08-25 ENCOUNTER — Inpatient Hospital Stay (HOSPITAL_COMMUNITY)
Admission: AD | Admit: 2012-08-25 | Discharge: 2012-08-26 | DRG: 247 | Disposition: A | Discharge status: Home / Self Care | Payer: Medicare Other | Source: Ambulatory Visit | Attending: Cardiovascular Disease | Admitting: Cardiovascular Disease

## 2012-08-25 ENCOUNTER — Encounter (HOSPITAL_COMMUNITY): Payer: Self-pay | Admitting: *Deleted

## 2012-08-25 ENCOUNTER — Encounter (HOSPITAL_COMMUNITY)
Admission: AD | Disposition: A | Discharge status: Home / Self Care | Payer: Self-pay | Source: Ambulatory Visit | Attending: Cardiovascular Disease

## 2012-08-25 ENCOUNTER — Encounter: Payer: Self-pay | Admitting: *Deleted

## 2012-08-25 ENCOUNTER — Ambulatory Visit (INDEPENDENT_AMBULATORY_CARE_PROVIDER_SITE_OTHER): Payer: Medicare Other | Admitting: Cardiovascular Disease

## 2012-08-25 ENCOUNTER — Encounter: Payer: Self-pay | Admitting: Cardiovascular Disease

## 2012-08-25 VITALS — BP 116/82 | HR 60 | Ht 68.0 in | Wt 197.4 lb

## 2012-08-25 DIAGNOSIS — I200 Unstable angina: Secondary | ICD-10-CM

## 2012-08-25 DIAGNOSIS — I10 Essential (primary) hypertension: Secondary | ICD-10-CM

## 2012-08-25 DIAGNOSIS — I2510 Atherosclerotic heart disease of native coronary artery without angina pectoris: Secondary | ICD-10-CM

## 2012-08-25 DIAGNOSIS — K219 Gastro-esophageal reflux disease without esophagitis: Secondary | ICD-10-CM | POA: Diagnosis present

## 2012-08-25 DIAGNOSIS — R079 Chest pain, unspecified: Secondary | ICD-10-CM

## 2012-08-25 DIAGNOSIS — G4730 Sleep apnea, unspecified: Secondary | ICD-10-CM | POA: Diagnosis present

## 2012-08-25 DIAGNOSIS — E785 Hyperlipidemia, unspecified: Secondary | ICD-10-CM | POA: Diagnosis present

## 2012-08-25 DIAGNOSIS — Z955 Presence of coronary angioplasty implant and graft: Secondary | ICD-10-CM

## 2012-08-25 DIAGNOSIS — E039 Hypothyroidism, unspecified: Secondary | ICD-10-CM

## 2012-08-25 DIAGNOSIS — R7309 Other abnormal glucose: Secondary | ICD-10-CM | POA: Diagnosis present

## 2012-08-25 HISTORY — PX: LEFT HEART CATHETERIZATION WITH CORONARY ANGIOGRAM: SHX5451

## 2012-08-25 LAB — MRSA PCR SCREENING: MRSA by PCR: NEGATIVE

## 2012-08-25 LAB — APTT: aPTT: 31 seconds (ref 24–37)

## 2012-08-25 LAB — PROTIME-INR
INR: 0.96 (ref 0.00–1.49)
Prothrombin Time: 12.6 seconds (ref 11.6–15.2)

## 2012-08-25 SURGERY — LEFT HEART CATHETERIZATION WITH CORONARY ANGIOGRAM
Anesthesia: LOCAL

## 2012-08-25 MED ORDER — NITROGLYCERIN 0.2 MG/ML ON CALL CATH LAB
INTRAVENOUS | Status: AC
  Filled 2012-08-25: qty 1

## 2012-08-25 MED ORDER — LIDOCAINE HCL (PF) 1 % IJ SOLN
INTRAMUSCULAR | Status: AC
  Filled 2012-08-25: qty 30

## 2012-08-25 MED ORDER — HEPARIN SODIUM (PORCINE) 1000 UNIT/ML IJ SOLN
INTRAMUSCULAR | Status: AC
  Filled 2012-08-25: qty 1

## 2012-08-25 MED ORDER — CLOPIDOGREL BISULFATE 75 MG PO TABS
75.0000 mg | ORAL_TABLET | Freq: Every day | ORAL | Status: DC
  Administered 2012-08-26: 75 mg via ORAL
  Filled 2012-08-25 (×2): qty 1

## 2012-08-25 MED ORDER — ZOLPIDEM TARTRATE 5 MG PO TABS
5.0000 mg | ORAL_TABLET | Freq: Once | ORAL | Status: AC
  Administered 2012-08-25: 5 mg via ORAL
  Filled 2012-08-25: qty 1

## 2012-08-25 MED ORDER — ONDANSETRON HCL 4 MG/2ML IJ SOLN
4.0000 mg | Freq: Four times a day (QID) | INTRAMUSCULAR | Status: DC | PRN
  Administered 2012-08-26: 4 mg via INTRAVENOUS
  Filled 2012-08-25: qty 2

## 2012-08-25 MED ORDER — PRAMOXINE-ZINC OXIDE IN MO 1-12.5 % RE OINT
1.0000 "application " | TOPICAL_OINTMENT | Freq: Three times a day (TID) | RECTAL | Status: DC | PRN
  Filled 2012-08-25: qty 28.3

## 2012-08-25 MED ORDER — SODIUM CHLORIDE 0.9 % IV SOLN: 250.0000 mL | INTRAVENOUS | Status: DC | PRN

## 2012-08-25 MED ORDER — ASPIRIN 81 MG PO CHEW: 324.0000 mg | CHEWABLE_TABLET | Freq: Once | ORAL | Status: DC

## 2012-08-25 MED ORDER — LEVOTHYROXINE SODIUM 100 MCG PO TABS
100.0000 ug | ORAL_TABLET | Freq: Every day | ORAL | Status: DC
  Administered 2012-08-26: 100 ug via ORAL
  Filled 2012-08-25 (×2): qty 1

## 2012-08-25 MED ORDER — DIPHENHYDRAMINE HCL 50 MG/ML IJ SOLN
INTRAMUSCULAR | Status: AC
  Filled 2012-08-25: qty 1

## 2012-08-25 MED ORDER — SODIUM CHLORIDE 0.9 % IJ SOLN: 3.0000 mL | Freq: Two times a day (BID) | INTRAMUSCULAR | Status: DC

## 2012-08-25 MED ORDER — SODIUM CHLORIDE 0.9 % IV SOLN: INTRAVENOUS | Status: AC

## 2012-08-25 MED ORDER — FENTANYL CITRATE 0.05 MG/ML IJ SOLN
INTRAMUSCULAR | Status: AC
  Filled 2012-08-25: qty 2

## 2012-08-25 MED ORDER — STUDY - INVESTIGATIONAL DRUG SIMPLE RECORD
1.0000 mg/kg | Freq: Once | Status: DC
  Filled 2012-08-25: qty 90

## 2012-08-25 MED ORDER — MIDAZOLAM HCL 2 MG/2ML IJ SOLN
INTRAMUSCULAR | Status: AC
  Filled 2012-08-25: qty 2

## 2012-08-25 MED ORDER — ASPIRIN 81 MG PO CHEW
81.0000 mg | CHEWABLE_TABLET | Freq: Every day | ORAL | Status: DC
  Administered 2012-08-26: 81 mg via ORAL
  Filled 2012-08-25: qty 1

## 2012-08-25 MED ORDER — HEPARIN (PORCINE) IN NACL 2-0.9 UNIT/ML-% IJ SOLN
INTRAMUSCULAR | Status: AC
  Filled 2012-08-25: qty 1000

## 2012-08-25 MED ORDER — DIPHENHYDRAMINE HCL 50 MG/ML IJ SOLN: 25.0000 mg | INTRAMUSCULAR | Status: AC

## 2012-08-25 MED ORDER — SODIUM CHLORIDE 0.9 % IV SOLN
INTRAVENOUS | Status: DC
  Administered 2012-08-25: 12:00:00 via INTRAVENOUS

## 2012-08-25 MED ORDER — STUDY - INVESTIGATIONAL DRUG SIMPLE RECORD
45.0000 mg | Freq: Once | Status: DC
  Filled 2012-08-25: qty 45

## 2012-08-25 MED ORDER — METOPROLOL TARTRATE 25 MG PO TABS
25.0000 mg | ORAL_TABLET | Freq: Two times a day (BID) | ORAL | Status: DC
  Administered 2012-08-25 – 2012-08-26 (×3): 25 mg via ORAL
  Filled 2012-08-25 (×5): qty 1

## 2012-08-25 MED ORDER — PANTOPRAZOLE SODIUM 40 MG PO TBEC
40.0000 mg | DELAYED_RELEASE_TABLET | Freq: Every day | ORAL | Status: DC
  Administered 2012-08-25 – 2012-08-26 (×2): 40 mg via ORAL
  Filled 2012-08-25 (×2): qty 1

## 2012-08-25 MED ORDER — FAMOTIDINE IN NACL 20-0.9 MG/50ML-% IV SOLN
20.0000 mg | INTRAVENOUS | Status: AC
  Administered 2012-08-25: 20 mg via INTRAVENOUS

## 2012-08-25 MED ORDER — MAGNESIUM HYDROXIDE 400 MG/5ML PO SUSP: 30.0000 mL | Freq: Every day | ORAL | Status: DC | PRN

## 2012-08-25 MED ORDER — LOPERAMIDE HCL 2 MG PO CAPS: 2.0000 mg | ORAL_CAPSULE | ORAL | Status: DC | PRN

## 2012-08-25 MED ORDER — SODIUM CHLORIDE 0.9 % IJ SOLN: 3.0000 mL | INTRAMUSCULAR | Status: DC | PRN

## 2012-08-25 MED ORDER — METHYLPREDNISOLONE SODIUM SUCC 125 MG IJ SOLR: 125.0000 mg | INTRAMUSCULAR | Status: AC

## 2012-08-25 MED ORDER — DIAZEPAM 5 MG PO TABS: 5.0000 mg | ORAL_TABLET | ORAL | Status: DC

## 2012-08-25 MED ORDER — ACETAMINOPHEN 325 MG PO TABS
650.0000 mg | ORAL_TABLET | ORAL | Status: DC | PRN
  Administered 2012-08-25 – 2012-08-26 (×2): 650 mg via ORAL
  Filled 2012-08-25 (×2): qty 2

## 2012-08-25 MED ORDER — GUAIFENESIN-DM 100-10 MG/5ML PO SYRP: 15.0000 mL | ORAL_SOLUTION | ORAL | Status: DC | PRN

## 2012-08-25 MED ORDER — LISINOPRIL 20 MG PO TABS
20.0000 mg | ORAL_TABLET | Freq: Every day | ORAL | Status: DC
  Administered 2012-08-25 – 2012-08-26 (×2): 20 mg via ORAL
  Filled 2012-08-25 (×2): qty 1

## 2012-08-25 MED ORDER — ACETAMINOPHEN 325 MG PO TABS
ORAL_TABLET | ORAL | Status: AC
  Administered 2012-08-25: 650 mg via ORAL
  Filled 2012-08-25: qty 2

## 2012-08-25 MED ORDER — ALUM & MAG HYDROXIDE-SIMETH 200-200-20 MG/5ML PO SUSP
30.0000 mL | ORAL | Status: DC | PRN
  Administered 2012-08-25: 30 mL via ORAL
  Filled 2012-08-25: qty 30

## 2012-08-25 MED ORDER — ACETAMINOPHEN 325 MG PO TABS: 650.0000 mg | ORAL_TABLET | Freq: Once | ORAL | Status: AC

## 2012-08-25 MED ORDER — ASPIRIN EC 81 MG PO TBEC: 81.0000 mg | DELAYED_RELEASE_TABLET | Freq: Every day | ORAL | Status: DC

## 2012-08-25 MED ORDER — CLOPIDOGREL BISULFATE 300 MG PO TABS
ORAL_TABLET | ORAL | Status: AC
  Filled 2012-08-25: qty 2

## 2012-08-25 MED ORDER — METHYLPREDNISOLONE SODIUM SUCC 125 MG IJ SOLR
INTRAMUSCULAR | Status: AC
  Administered 2012-08-25: 125 mg via INTRAVENOUS
  Filled 2012-08-25: qty 2

## 2012-08-25 MED ORDER — VERAPAMIL HCL 2.5 MG/ML IV SOLN
INTRAVENOUS | Status: AC
  Filled 2012-08-25: qty 2

## 2012-08-25 NOTE — Research (Signed)
REGULATE PCI Research Study Informed Consent   Subject Name: Tony Hunt  Subject met inclusion and exclusion criteria.  The informed consent form, study requirements and expectations were reviewed with the subject and questions and concerns were addressed prior to the signing of the consent form.  The subject verbalized understanding of the trail requirements.  The subject agreed to participate in the [facility] Research trial and signed the informed consent.  The informed consent was obtained prior to performance of any protocol-specific procedures for the subject. Subject signed the informed consent form at 1248 on 08/25/12.  A copy of the signed informed consent was given to the subject and a copy was placed in the subject's medical record.  Claire Shown 08/25/2012, 2:17 PM

## 2012-08-25 NOTE — Interval H&P Note (Signed)
History and Physical Interval Note:  08/25/2012 12:49 PM  Tony Hunt  has presented today for cardiac cath with the diagnosis of Chest pain.  The various methods of treatment have been discussed with the patient and family. After consideration of risks, benefits and other options for treatment, the patient has consented to  Procedure(s): LEFT HEART CATHETERIZATION WITH CORONARY ANGIOGRAM (N/A) as a surgical intervention .  The patient's history has been reviewed, patient examined, no change in status, stable for surgery.  I have reviewed the patient's chart and labs.  Questions were answered to the patient's satisfaction.    Cath Lab Visit (complete for each Cath Lab visit)  Clinical Evaluation Leading to the Procedure:   ACS: no  Non-ACS:    Anginal Classification: CCS III  Anti-ischemic medical therapy: No Therapy  Non-Invasive Test Results: No non-invasive testing performed  Prior CABG: No previous CABG        MCALHANY,CHRISTOPHER

## 2012-08-25 NOTE — H&P (Signed)
See full clinic note today by Dr. Elease Hashimoto.   57 yo male with history of tobacco abuse, HTN, HLD presented to office this am with chest pain. Evaluated by Dr. Elease Hashimoto. He was sent to [facility] for Urgent cath this afternoon. No active chest pain.   BMET    Component Value Date/Time   NA 139 08/23/2012 1253   K 4.2 08/23/2012 1253   CL 105 08/23/2012 1253   CO2 23 08/23/2012 1253   GLUCOSE 95 08/23/2012 1253   BUN 14 08/23/2012 1253   CREATININE 1.18 08/23/2012 1253   CALCIUM 9.2 08/23/2012 1253   GFRNONAA 66* 08/23/2012 1253   GFRAA 77* 08/23/2012 1253   CBC    Component Value Date/Time   WBC 4.8 08/23/2012 1253   WBC 4.0 07/06/2010 1113   RBC 4.53 08/23/2012 1253   RBC 4.07* 07/06/2010 1113   HGB 13.8 08/23/2012 1253   HGB 12.5* 07/06/2010 1113   HCT 39.6 08/23/2012 1253   HCT 36.5* 07/06/2010 1113   PLT 247 08/23/2012 1253   PLT 236 07/06/2010 1113   MCV 87.4 08/23/2012 1253   MCV 89.7 07/06/2010 1113   MCH 30.5 08/23/2012 1253   MCH 30.7 07/06/2010 1113   MCHC 34.8 08/23/2012 1253   MCHC 34.2 07/06/2010 1113   RDW 13.0 08/23/2012 1253   RDW 13.2 07/06/2010 1113   LYMPHSABS 1.3 02/19/2011 2325   LYMPHSABS 0.7* 07/06/2010 1113   MONOABS 1.2* 02/19/2011 2325   MONOABS 0.4 07/06/2010 1113   EOSABS 0.0 02/19/2011 2325   EOSABS 0.2 07/06/2010 1113   BASOSABS 0.0 02/19/2011 2325   BASOSABS 0.0 07/06/2010 1113   Plan: Cardiac cath with possible PCI today.   Martine Trageser 12:49 PM 08/25/2012

## 2012-08-25 NOTE — CV Procedure (Addendum)
Cardiac Catheterization Operative Report  Tony Hunt [id number] 6/27/20142:06 PM [name], MD  Procedure Performed:  1. Left Heart Catheterization 2. Selective Coronary Angiography 3. Left ventricular angiogram 4.   PTCA/DES x 1 mid LAD  Operator: Verne Carrow, MD  Arterial access site:  Right radial artery.   Indication: 57 yo male with history of tobacco abuse, HTN, HLD with recent chest pains with minimal exertion c/w class III angina. Admitted from the office today for urgent cardiac cath.                                      Procedure Details: The risks, benefits, complications, treatment options, and expected outcomes were discussed with the patient. The patient and/or family concurred with the proposed plan, giving informed consent. The patient was brought to the cath lab after IV hydration was begun and oral premedication was given. The patient was further sedated with Versed and Fentanyl. The right wrist was assessed with an Allens test which was positive. The right wrist was prepped and draped in a sterile fashion. 1% lidocaine was used for local anesthesia. Using the modified Seldinger access technique, a 5 French sheath was placed in the right radial artery. 3 mg Verapamil was given through the sheath. 4500 units IV heparin was given. Standard diagnostic catheters were used to perform selective coronary angiography. A pigtail catheter was used to perform a left ventricular angiogram. The patient was found to have a severe stenosis in the mid LAD. This lesion was hazy and in several views appeared to represent 90% stenosis.  We elected to proceed to PCI.   PCI Note:  He was randomized into the REGULATE study. The sheath was upsized to a 6 [place] system. He was given Plavix 600 mg po x 1. He was given the REGULATE study drug per protocol for anti-coagulation. No ACT was drawn. Two minutes after the study drug was given, I engaged the left main with a XB LAD 3.5  guiding catheter. I then passed a BMW wire down the LAD. A 2.75 x 12 mm Promus Premier DES was deployed in the mid LAD. The stent was post-dilated with a 3.0 x 9 mm Tice balloon. The stenosis was taken from 90% down to 0%. The study drug was reversed pre protocol. The IC wire was removed.   The sheath was removed from the right radial artery and a Terumo hemostasis band was applied at the arteriotomy site on the right wrist.    There were no immediate complications. The patient was taken to the recovery area in stable condition.   Hemodynamic Findings: Central aortic pressure: 124/70 Left ventricular pressure: 124/15/25  Angiographic Findings:  Left main: No obstructive disease.   Left Anterior Descending Artery: Large caliber vessel with moderate caliber diagonal branch. The mid LAD beyond the diagonal branch has an eccentric, hazy 90% stenosis. Beyond this area the mid vessel has diffuse 40% stenosis. The LAD quickly tapers to a very small caliber vessel distally with diffuse plaque.   Circumflex Artery: Large caliber vessel with two small caliber obtuse marginal branches and a moderate caliber third obtuse marginal branch. The AV groove Circumflex has no obstructive disease. The third obtuse marginal branch has ostial 30% stenosis.   Right Coronary Artery: Very large dominant vessel with mild luminal irregularities in the proximal vessel.   Left Ventricular Angiogram: LVEF=60%  Impression: 1. Unstable angina (class  III) 2. Severe single vessel CAD 3. Preserved LV systolic function 4. Successful PTCA/DES x mid LAD  Recommendations: He will need dual anti-platelet therapy with ASA and Plavix for at least one year. Will start beta blocker. He is intolerant to statins.        Complications:  None. The patient tolerated the procedure well.

## 2012-08-25 NOTE — Progress Notes (Signed)
Tony Hunt Date of Birth  15-Apr-1955       [facility] Office [address], [address]  [address], [address] [place], [place]  [address]   [place], [place]  [address] [phone]     [phone]   Fax  [phone]    Fax [phone]  Problem List: 1. Chest pain 2. Hypertension 3. Hypothyroidism 4. hyperlipidemia  History of Present Illness:  Tony Hunt is a 57 yo with hx of progressive CP.  These pains have progressed over the past several weeks.  Initially he had only one or 2 times per week. These pains have progressed and he now has several episodes of chest pain each day.  The pains are associated with some dyspnea and lightheadness.   They are not associated with exercise, eating, drinking, change of position. The pains seem to last about 1 minute.  He has not found anything that specifically relieves the pain. They tend to resolve spontaneously.  He had a particularly bad episode of chest discomfort early this week and went to the emergency room. His workup there was unremarkable.  He's had progressive angina. He had an episode of chest pain the office today. The there were no associated EKG changes.  Current Outpatient Prescriptions on File Prior to Visit  Medication Sig Dispense Refill  . fish oil-omega-3 fatty acids 1000 MG capsule Take 1 g by mouth daily.      Marland Kitchen levothyroxine (SYNTHROID, LEVOTHROID) 100 MCG tablet Take 100 mcg by mouth daily before breakfast.      . lisinopril (PRINIVIL,ZESTRIL) 20 MG tablet Take 1 tablet (20 mg total) by mouth daily.  30 tablet  12  . omeprazole (PRILOSEC) 20 MG capsule Take 1 capsule (20 mg total) by mouth daily.  30 capsule  12   No current facility-administered medications on file prior to visit.    Allergies  Allergen Reactions  . Iohexol Anaphylaxis    OK with 13 hour prep  . Codeine Itching  . Simvastatin     Edema  . Statins     Sig edema on simvastatin    Past Medical History    Diagnosis Date  . 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 HYPERLIPIDEMIA 05/10/2007  . 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 HYPERTENSION 05/10/2007  . ESOPHAGEAL STRICTURE 05/10/2007  . GERD 05/10/2007  . HIATAL HERNIA 05/10/2007  . SLEEP APNEA 05/10/2007  . ALCOHOL ABUSE, HX OF     distant history  . Upper airway cough syndrome     Per Dr. Sherene Sires, pulmonary   . 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 ANXIETY DEPRESSION 05/10/2007    history of, during difficult relationship  . Anxiety     occ panic sx, increased after death of mother    Past Surgical History  Procedure Laterality Date  . Back surgery  1997    lower back x3  . Wrist fracture surgery  08/21/10    Right wrist x4  . Colonoscopy    . Appendectomy      History  Smoking status  . Former Smoker  . Quit date: 09/29/2009  Smokeless tobacco  . Former Neurosurgeon   He is a retired Engineer, drilling.  He does lots of yard work, garden work, fishing.  History  Alcohol Use No    Family History  Problem Relation Age of Onset  .  Brain cancer Father   . Dementia Father     h/o brain tumor  . Heart disease Mother   . Hypertension Mother   . Colon cancer Neg Hx   . Prostate cancer Neg Hx     Reviw of Systems:  Reviewed in the HPI.  All other systems are negative.  Physical Exam: Blood pressure 116/82, pulse 60, height 5\' 8"  (1.727 m), weight 197 lb 6.4 oz (89.54 kg), SpO2 96.00%. General: Well developed, well nourished, in no acute distress.  Head: Normocephalic, atraumatic, sclera non-icteric, mucus membranes are moist,   Neck: Supple. Carotids are 2 + without bruits. No JVD   Lungs: Clear   Heart: RR, normal S1, S2  Abdomen: Soft, non-tender, non-distended with normal bowel sounds.  Msk:  Strength and tone are normal   Extremities: No clubbing or cyanosis. No edema.  Distal pedal pulses are 2+ and equal   Neuro: CN II - XII  intact.  Alert and oriented X 3.   Psych:  Normal   ECG: August 25, 2012  :Sinus brady at 54, Inc. RBBB.   Assessment / Plan:    Patient received 4 baby aspirin in the office today.

## 2012-08-25 NOTE — Assessment & Plan Note (Signed)
The patient presents today with symptoms consistent with unstable angina. He's a chest pain that has gradually progressed over the past several weeks. This past week he had a very severe episode and wound up going to the emergency room. His workup there was unremarkable. Since that time his chest pains have continued to progress.  He does not have any acute changes but I am very concerned about the progressive nature of this chest pain. I suggested that we proceed on to do a heart catheterization today. He has an IV contrast allergy. We will premedicate him. He did fine with a CT contrast several months ago after being premedicated.  We have discussed the risks, benefits, and options of cardiac apposition. He understands and agrees to proceed.

## 2012-08-25 NOTE — Patient Instructions (Addendum)
Your physician has requested that you have a cardiac catheterization. Cardiac catheterization is used to diagnose and/or treat various heart conditions. Doctors may recommend this procedure for a number of different reasons. The most common reason is to evaluate chest pain. Chest pain can be a symptom of coronary artery disease (CAD), and cardiac catheterization can show whether plaque is narrowing or blocking your heart's arteries. This procedure is also used to evaluate the valves, as well as measure the blood flow and oxygen levels in different parts of your heart.  Please follow instruction sheet, as given.     

## 2012-08-26 DIAGNOSIS — E039 Hypothyroidism, unspecified: Secondary | ICD-10-CM

## 2012-08-26 DIAGNOSIS — I200 Unstable angina: Secondary | ICD-10-CM

## 2012-08-26 LAB — CBC
MCH: 30.5 pg (ref 26.0–34.0)
Platelets: 249 10*3/uL (ref 150–400)
RBC: 4.33 MIL/uL (ref 4.22–5.81)

## 2012-08-26 LAB — BASIC METABOLIC PANEL
CO2: 22 mEq/L (ref 19–32)
Calcium: 8.5 mg/dL (ref 8.4–10.5)
GFR calc non Af Amer: 76 mL/min — ABNORMAL LOW (ref >90)
Sodium: 140 mEq/L (ref 135–145)

## 2012-08-26 MED ORDER — METOPROLOL TARTRATE 25 MG PO TABS: 25.0000 mg | ORAL_TABLET | Freq: Two times a day (BID) | ORAL | Status: DC

## 2012-08-26 MED ORDER — CLOPIDOGREL BISULFATE 75 MG PO TABS: 75.0000 mg | ORAL_TABLET | Freq: Every day | ORAL | Status: DC

## 2012-08-26 MED ORDER — NITROGLYCERIN 0.4 MG SL SUBL: 0.4000 mg | SUBLINGUAL_TABLET | SUBLINGUAL | Status: DC | PRN

## 2012-08-26 MED ORDER — PRAVASTATIN SODIUM 20 MG PO TABS: 20.0000 mg | ORAL_TABLET | Freq: Every day | ORAL | Status: DC

## 2012-08-26 MED ORDER — PANTOPRAZOLE SODIUM 40 MG PO TBEC: 40.0000 mg | DELAYED_RELEASE_TABLET | Freq: Every day | ORAL | Status: DC

## 2012-08-26 NOTE — Progress Notes (Signed)
CARDIAC REHAB PHASE I   PRE:  Rate/Rhythm: 78 SR    BP: sitting 168/85    SaO2:   MODE:  Ambulation: 740 ft   POST:  Rate/Rhythm: 82 SR    BP: sitting 140/95     SaO2:   Tolerated very well. Only c/o is being hot due to heat in room (broken). Felt better in hall (had been nauseated and BP up, ? Due to the  Heat). Ed completed. Interested in [facility] although he does walk daily. Will send referral to [facility]. [phone]   Harriet Masson CES, ACSM 08/26/2012 9:31 AM

## 2012-08-26 NOTE — Progress Notes (Signed)
Patient ID: Rhett Bannister, male   DOB: Jan 07, 1956, 57 y.o.   MRN: [id number]   Patient Name: YUAN GANN Date of Encounter: 08/26/2012    SUBJECTIVE  No Cp or SOB. Wants to go home.  CURRENT MEDS . aspirin  81 mg Oral Daily  . clopidogrel  75 mg Oral Q breakfast  . levothyroxine  100 mcg Oral QAC breakfast  . lisinopril  20 mg Oral Daily  . metoprolol tartrate  25 mg Oral BID  . pantoprazole  40 mg Oral Daily    OBJECTIVE  Filed Vitals:   08/26/12 0200 08/26/12 0400 08/26/12 0430 08/26/12 0754  BP: 114/56  124/64   Pulse: 73  84   Temp:  98.6 F (37 C)  98.5 F (36.9 C)  TempSrc:  Oral  Oral  Resp: 13  14   Height:      Weight:      SpO2: 92%  94%     Intake/Output Summary (Last 24 hours) at 08/26/12 1059 Last data filed at 08/26/12 0200  Gross per 24 hour  Intake 1143.75 ml  Output    700 ml  Net 443.75 ml   Filed Weights   08/25/12 1217 08/25/12 1432  Weight: 198 lb 6.6 oz (90 kg) 200 lb 9.9 oz (91 kg)    PHYSICAL EXAM  General: Pleasant, NAD. Neuro: Alert and oriented X 3. Moves all extremities spontaneously. Psych: Normal affect. HEENT:  Normal  Neck: Supple without bruits or JVD. Lungs:  Resp regular and unlabored, CTA. Heart: RRR no s3, s4, or murmurs. Abdomen: Soft, non-tender, non-distended, BS + x 4.  Extremities: No clubbing, cyanosis or edema. DP/PT/Radials 2+ and equal bilaterally.  Accessory Clinical Findings  CBC  Recent Labs  08/23/12 1253 08/26/12 0515  WBC 4.8 8.0  HGB 13.8 13.2  HCT 39.6 37.5*  MCV 87.4 86.6  PLT 247 249   Basic Metabolic Panel  Recent Labs  08/23/12 1253 08/26/12 0515  NA 139 140  K 4.2 4.2  CL 105 107  CO2 23 22  GLUCOSE 95 186*  BUN 14 14  CREATININE 1.18 1.06  CALCIUM 9.2 8.5   Liver Function Tests No results found for this basename: AST, ALT, ALKPHOS, BILITOT, PROT, ALBUMIN,  in the last 72 hours No results found for this basename: LIPASE, AMYLASE,  in the last 72 hours Cardiac  Enzymes No results found for this basename: CKTOTAL, CKMB, CKMBINDEX, TROPONINI,  in the last 72 hours BNP No components found with this basename: POCBNP,  D-Dimer No results found for this basename: DDIMER,  in the last 72 hours Hemoglobin A1C No results found for this basename: HGBA1C,  in the last 72 hours Fasting Lipid Panel No results found for this basename: CHOL, HDL, LDLCALC, TRIG, CHOLHDL, LDLDIRECT,  in the last 72 hours Thyroid Function Tests No results found for this basename: TSH, T4TOTAL, FREET3, T3FREE, THYROIDAB,  in the last 72 hours  TELE  NSR  ECG  NSR< Normal EKG  Radiology/Studies  Dg Chest 2 View  08/23/2012   *RADIOLOGY REPORT*  Clinical Data: Upper chest pain and shortness of breath.  CHEST - 2 VIEW  Comparison: CT chest 06/30/2011 and chest radiograph 02/20/2011.  Findings: Trachea is midline.  Heart size normal.  Lungs are clear. No pleural fluid.  Old left rib fractures.  IMPRESSION: No acute findings.   Original Report Authenticated By: Leanna Battles, M.D.    ASSESSMENT AND PLAN   Status LAD PCI for UAP.  Says could not take simvastatin because of nausea and feeling bad. Is willing to try low dose Pravastatin. Follow up with [facility] in 2 weeks.  Signed, Valera Castle MD

## 2012-08-26 NOTE — Discharge Summary (Signed)
Discharge Summary   Patient ID: Tony Hunt,  MRN: [id number], DOB/AGE: [birth year] 58 y.o.  Admit date: 08/25/2012 Discharge date: 08/26/2012  Primary Physician: [name], MD Primary Cardiologist: Katherina Right, MD  Discharge Diagnoses Principal Problem:   Unstable angina Active Problems:   HYPERLIPIDEMIA   HYPERTENSION   GERD   SLEEP APNEA   HYPERGLYCEMIA   Hypothyroidism   Allergies Allergies  Allergen Reactions  . Iohexol Anaphylaxis    OK with 13 hour prep  . Codeine Itching  . Simvastatin     Edema  . Statins     Sig edema on simvastatin    Diagnostic Studies/Procedures  CARDIAC CATHETERIZATION + PERCUTANEOUS CORONARY INTERVENTION - 08/25/12  Procedure Performed:  1. Left Heart Catheterization 2. Selective Coronary Angiography 3. Left ventricular angiogram       4.   PTCA/DES x 1 mid LAD   Hemodynamic Findings:  Central aortic pressure: 124/70  Left ventricular pressure: 124/15/25  Angiographic Findings:  Left main: No obstructive disease.  Left Anterior Descending Artery: Large caliber vessel with moderate caliber diagonal branch. The mid LAD beyond the diagonal branch has an eccentric, hazy 90% stenosis. Beyond this area the mid vessel has diffuse 40% stenosis. The LAD quickly tapers to a very small caliber vessel distally with diffuse plaque.  Circumflex Artery: Large caliber vessel with two small caliber obtuse marginal branches and a moderate caliber third obtuse marginal branch. The AV groove Circumflex has no obstructive disease. The third obtuse marginal branch has ostial 30% stenosis.  Right Coronary Artery: Very large dominant vessel with mild luminal irregularities in the proximal vessel.  Left Ventricular Angiogram: LVEF=60%  Impression:  1. Unstable angina (class III)  2. Severe single vessel CAD  3. Preserved LV systolic function  4. Successful PTCA/DES x mid LAD  History of Present Illness  Tony Hunt is a 57 y.o. male who was  admitted to [facility] hospital on 08/25/12 with the above problem list.   He is a 57 year old Caucasian gentleman with past medical history significant for hyperglycemia, hypertension, hyperlipidemia, sleep apnea, distant history of alcohol abuse, hypothyroidism, anxiety, GERD, esophageal stricture and history of upper airway cough syndrome. He presented to the [facility] office in [place] after complaining of a two-week history of progressive chest tightness with associated dyspnea lasting approximately 1 minute at a time resolving spontaneously. There was no particular association to meals, exertion or change in position. He did have an episode of chest discomfort on followup, however there were no specific EKG changes. He had presented for followup after initial workup at a nearby emergency department. Given his cardiac risk factors (at least moderate pretest probability for ACS) and history of present illness concerning for unstable angina, the decision was made to proceed with diagnostic cardiac catheterization that afternoon. He presented to [facility] on 08/25/12 for this procedure.  Hospital Course   The patient was found to meet inclusion and exclusion criteria for the regulate PCI study. He was thus enrolled. He was informed, consented and prepped for cardiac catheterization which was accessed via the right radial artery. As above, this revealed a 90% hazy mid LAD s/p successful DES placement, mid/distal diffuse 40% LAD, 30% ostial OM3 stenoses, otherwise mild luminal irregularities elsewhere; LVEF 60%. Recommendation was made to pursue dual antiplatelet therapy- aspirin/Plavix x12 months. He tolerated the procedure well without complications, and was observed in the CCU overnight.  He remained asymptomatic overnight and the following morning. He ambulated with cardiac rehabilitation  without incident. His room was noted to be fairly warm and he did have an episode of nausea. As a  result he was hypertensive in the morning as well, which improved with the administration of his outpatient ACE inhibitor and newly prescribed Lopressor. He was evaluated by Dr. Markel Mergenthaler this morning and deemed stable for discharge. He expressed reluctance he on starting a statin as he had had nausea with simvastatin in the past. He agreed to a trial of pravastatin which will be started. Note, LFTs and lipid panel will need to be checked in 6 weeks. He will otherwise be discharged on aspirin, Plavix, ACE inhibitor, beta blocker, statin and sublingual nitroglycerin when necessary. Prilosec was replaced with Protonix given concomitant Plavix use. He did not endorse a cough while inpatient during this admission; however, if his cough persists as an outpatient, consider switching from an ACE inhibitor to an ARB. He was noted to be hyperglycemic this admission (fasting glucose 180s). He was advised to followup with his primary care provider for further evaluation of diabetes. He will followup in the office in approximately 14 days as a transitional care management patient. The office will call him at that point in time. This information, including post cath instructions, has been clearly outlined in the discharge AVS.   Discharge Vitals:  Blood pressure 156/84, pulse 82, temperature 98.3 F (36.8 C), temperature source Oral, resp. rate 15, height 5\' 8"  (1.727 m), weight 91 kg (200 lb 9.9 oz), SpO2 97.00%.   Labs: Recent Labs     08/23/12  1253  08/26/12  0515  WBC  4.8  8.0  HGB  13.8  13.2  HCT  39.6  37.5*  MCV  87.4  86.6  PLT  247  249   Recent Labs Lab 08/23/12 1253 08/26/12 0515  NA 139 140  K 4.2 4.2  CL 105 107  CO2 23 22  BUN 14 14  CREATININE 1.18 1.06  CALCIUM 9.2 8.5  GLUCOSE 95 186*   Disposition:  Discharge Orders   Future Orders Complete By Expires     Diet - low sodium heart healthy  As directed     Increase activity slowly  As directed           Follow-up Information    Follow up with [facility]. (Office will call you with an appointment date & time in 1-2 weeks. )    Contact information:   [address] [place] [address]       Follow up with [name], MD. Schedule an appointment as soon as possible for a visit in 1 week. (For post-hospital follow-up. )    Contact information:   [address] [place] [address] [phone]       Discharge Medications:    Medication List    STOP taking these medications       omeprazole 20 MG capsule  Commonly known as:  PRILOSEC  Replaced by:  pantoprazole 40 MG tablet      TAKE these medications       aspirin EC 81 MG tablet  Take 1 tablet (81 mg total) by mouth daily.     clopidogrel 75 MG tablet  Commonly known as:  PLAVIX  Take 1 tablet (75 mg total) by mouth daily with breakfast.     fish oil-omega-3 fatty acids 1000 MG capsule  Take 1 g by mouth daily.     levothyroxine 100 MCG tablet  Commonly known as:  SYNTHROID, LEVOTHROID  Take 100 mcg by mouth daily before breakfast.     lisinopril 20 MG tablet  Commonly known as:  PRINIVIL,ZESTRIL  Take 1 tablet (20 mg total) by mouth daily.     metoprolol tartrate 25 MG tablet  Commonly known as:  LOPRESSOR  Take 1 tablet (25 mg total) by mouth 2 (two) times daily.     nitroGLYCERIN 0.4 MG SL tablet  Commonly known as:  NITROSTAT  Place 1 tablet (0.4 mg total) under the tongue every 5 (five) minutes as needed for chest pain.     pantoprazole 40 MG tablet  Commonly known as:  PROTONIX  Take 1 tablet (40 mg total) by mouth daily.     pravastatin 20 MG tablet  Commonly known as:  PRAVACHOL  Take 1 tablet (20 mg total) by mouth daily.       Outstanding Labs/Studies: None  Duration of Discharge Encounter: Greater than 30 minutes including physician time.  Signed, R. Hurman Horn, PA-C 08/26/2012, 11:55 AM    Jesse Sans. Daleen Squibb, MD, [facility] Pager:  [phone]

## 2012-08-28 ENCOUNTER — Telehealth: Payer: Self-pay | Admitting: Cardiovascular Disease

## 2012-08-28 ENCOUNTER — Encounter: Payer: Self-pay | Admitting: *Deleted

## 2012-08-28 DIAGNOSIS — I2510 Atherosclerotic heart disease of native coronary artery without angina pectoris: Secondary | ICD-10-CM | POA: Insufficient documentation

## 2012-08-28 HISTORY — DX: Atherosclerotic heart disease of native coronary artery without angina pectoris: I25.10

## 2012-08-28 MED ORDER — PRASUGREL HCL 10 MG PO TABS: 10.0000 mg | ORAL_TABLET | Freq: Every day | ORAL | Status: DC

## 2012-08-28 NOTE — Telephone Encounter (Signed)
Pt describes HA w/ nausea that is not relived with tylenol for 3 days. "feels like my head is going to split"  Current bp 136/78 but Saturday it was 148/100.  Pt had resent stent and after med review the only new med is plavix. I will review with Dr Elease Hashimoto.

## 2012-08-28 NOTE — Telephone Encounter (Signed)
Per Dr Nahser/ dc plavix due to HA and start effient 10 mg a day. Pt aware.

## 2012-08-28 NOTE — Telephone Encounter (Signed)
New Problem  Pt's wife states that the medicine he is on is giving him a really bad headache and is making him nauseated

## 2012-08-29 MED ORDER — CLOPIDOGREL BISULFATE 75 MG PO TABS: 75.0000 mg | ORAL_TABLET | Freq: Every day | ORAL | Status: DC

## 2012-08-29 NOTE — Telephone Encounter (Signed)
Spoke with pt, feeling much better today after stopping pravastatin, pt will continue Plavix instead of Effient.  Med list adjusted. Pt has app tomorrow.

## 2012-08-30 ENCOUNTER — Ambulatory Visit (INDEPENDENT_AMBULATORY_CARE_PROVIDER_SITE_OTHER): Payer: Medicare Other | Admitting: Cardiovascular Disease

## 2012-08-30 ENCOUNTER — Encounter: Payer: Self-pay | Admitting: Cardiovascular Disease

## 2012-08-30 VITALS — BP 130/88 | HR 56 | Ht 68.0 in | Wt 194.0 lb

## 2012-08-30 DIAGNOSIS — E785 Hyperlipidemia, unspecified: Secondary | ICD-10-CM

## 2012-08-30 DIAGNOSIS — I2510 Atherosclerotic heart disease of native coronary artery without angina pectoris: Secondary | ICD-10-CM

## 2012-08-30 MED ORDER — ATORVASTATIN CALCIUM 20 MG PO TABS: 20.0000 mg | ORAL_TABLET | Freq: Every day | ORAL | Status: DC

## 2012-08-30 NOTE — Assessment & Plan Note (Signed)
Tony Hunt is doing well .  He is feeling much better.    He did not tolerate Pravachol.  Will try atorvastatin 20 mg a day.  I will see him in 3 months for OV and fasting lab, ECG.

## 2012-08-30 NOTE — Patient Instructions (Addendum)
Your physician recommends that you schedule a follow-up appointment in: 3 months  Your physician has recommended you make the following change in your medication:   Try atorvastatin 20 mg daily with eve meal  Your physician recommends that you return for a FASTING lipid profile: 3 months     [facility] Low Glycemic Diet (Source: [facility], 2006) Low Glycemic Foods (20-49) (Decrease risk of developing heart disease) Breakfast Cereals: All-Bran All-Bran Fruit 'n Oats Fiber One Oatmeal (not instant) Oat bran Fruits and fruit juices: (Limit to 1-2 servings per day) Apples Apricots (fresh & dried) Blackberries Blueberries Cherries Cranberries Peaches Pears Plums Prunes Grapefruit Raspberries Strawberries Tangerine Apple juice Grapefruit juice Tomato juice Beans and legumes (fresh-cooked): Black-eyed peas Butter beans Chick peas Lentils  Green beans Lima beans Kidney beans Navy beans Pinto beans Snow peas Non-starchy vegetables: Asparagus, avocado, broccoli, cabbage, cauliflower, celery, cucumber, greens, lettuce, mushrooms, peppers, tomatoes, okra, onions, spinach, summer squash Grains: Barley Bulgur Rye Wild rice Nuts and oils : Almonds Peanuts Sunflower seeds Hazelnuts Pecans Walnuts Oils that are liquid at room temperature Dairy, fish, meat, soy, and eggs: Milk, skim Lowfat cheese Yogurt, lowfat, fruit sugar sweetened Lean red meat Fish  Skinless chicken & [place] Shellfish Egg whites (up to 3 daily) Soy products  Egg yolks (up to 7 or _____ per week) Moderate Glycemic Foods (50-69) Breakfast Cereals: Bran Buds Bran Chex Just Right Mini-Wheats  Special K Swiss muesli Fruits: Banana (under-ripe) Dates Figs Grapes Kiwi Mango Oranges Raisins Fruit Juices: Cranberry juice Orange juice Beans and legumes: [place]-type baked beans Canned pinto, kidney, or navy beans Green peas Vegetables: Beets Carrots  Sweet potato Yam Corn on the  cob Breads: Pita (pocket) bread Oat bran bread Pumpernickel bread Rye bread Wheat bread, high fiber  Grains: Cornmeal Rice, brown Rice, white Couscous Pasta: Macaroni Pizza, cheese Ravioli, meat filled Spaghetti, white  Nuts: Cashews Macadamia Snacks: Chocolate Ice cream, lowfat Muffin Popcorn High Glycemic Foods (70-100)  Breakfast Cereals: Cheerios Corn Chex Corn Flakes Cream of Wheat Grape Nuts Grape Nut Flakes Grits Nutri-Grain Puffed Rice Puffed Wheat Rice Chex Rice Krispies Shredded Wheat Team Total Fruits: Pineapple Watermelon Banana (over-ripe) Beverages: Sodas, sweet tea, pineapple juice Vegetables: Potato, baked, boiled, fried, mashed [place] fries Canned or frozen corn Parsnips Winter squash Breads: Most breads (white and whole grain) Bagels Bread sticks Bread stuffing Kaiser roll Dinner rolls Grains: Rice, instant Tapioca, with milk Candy and most cookies Snacks: Donuts Corn chips Jelly beans Pretzels Pastries

## 2012-08-30 NOTE — ED Provider Notes (Signed)
History    CSN: [id number] Arrival date & time 08/23/12  1241  First MD Initiated Contact with Patient 08/23/12 1510     Chief Complaint  Patient presents with  . Chest Pain   (Consider location/radiation/quality/duration/timing/severity/associated sxs/prior Treatment) HPIpatient presents emergency department with chest pain, that began earlier this morning.  Patient, states, that the chest has been constant, states it'll last for less than a minute at a time.  Patient denies shortness breath, nausea, vomiting, abdominal pain, back pain, diarrhea, fever, cough , dizziness, weaknesssyncope blurred vision, or headache.  Patient, states, that he did not take anything prior to arrival for his symptoms.  Patient, states the nothing seems to make his condition, better or worse.patient states, that is never had any cardiac problems in the past.  Patient denies any exertional symptoms, or diaphoresis Past Medical History  Diagnosis Date  . 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 HYPERLIPIDEMIA 05/10/2007  . 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 HYPERTENSION 05/10/2007  . ESOPHAGEAL STRICTURE 05/10/2007  . GERD 05/10/2007  . HIATAL HERNIA 05/10/2007  . SLEEP APNEA 05/10/2007  . ALCOHOL ABUSE, HX OF     distant history  . Upper airway cough syndrome     Per Dr. Sherene Sires, pulmonary   . 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 ANXIETY DEPRESSION 05/10/2007    history of, during difficult relationship  . Anxiety     occ panic sx, increased after death of mother   Past Surgical History  Procedure Laterality Date  . Back surgery  1997    lower back x3  . Wrist fracture surgery  08/21/10    Right wrist x4  . Colonoscopy    . Appendectomy     Family History  Problem Relation Age of Onset  . Brain cancer Father   . Dementia Father     h/o brain tumor  . Heart disease Mother   . Hypertension Mother   . Colon cancer Neg Hx    . Prostate cancer Neg Hx    History  Substance Use Topics  . Smoking status: Former Smoker    Quit date: 09/29/2009  . Smokeless tobacco: Former Neurosurgeon  . Alcohol Use: No    Review of Systems  All other systems negative except as documented in the HPI. All pertinent positives and negatives as reviewed in the HPI.  Allergies  Iohexol; Pravastatin; Codeine; Simvastatin; and Statins  Home Medications   Current Outpatient Rx  Name  Route  Sig  Dispense  Refill  . fish oil-omega-3 fatty acids 1000 MG capsule   Oral   Take 1 g by mouth daily.         Marland Kitchen levothyroxine (SYNTHROID, LEVOTHROID) 100 MCG tablet   Oral   Take 100 mcg by mouth daily before breakfast.         . lisinopril (PRINIVIL,ZESTRIL) 20 MG tablet   Oral   Take 1 tablet (20 mg total) by mouth daily.   30 tablet   12   . aspirin EC 81 MG tablet   Oral   Take 1 tablet (81 mg total) by mouth daily.   90 tablet   3     Pt was given 4-( 81mg ) now   . atorvastatin (LIPITOR) 20 MG tablet   Oral   Take 1 tablet (20 mg total) by mouth daily.   90 tablet   3   .  clopidogrel (PLAVIX) 75 MG tablet   Oral   Take 1 tablet (75 mg total) by mouth daily.   90 tablet   3     Pt will continue plavix and will not start effient   . metoprolol tartrate (LOPRESSOR) 25 MG tablet   Oral   Take 1 tablet (25 mg total) by mouth 2 (two) times daily.   60 tablet   3   . nitroGLYCERIN (NITROSTAT) 0.4 MG SL tablet   Sublingual   Place 1 tablet (0.4 mg total) under the tongue every 5 (five) minutes as needed for chest pain.   25 tablet   3   . pantoprazole (PROTONIX) 40 MG tablet   Oral   Take 1 tablet (40 mg total) by mouth daily.   30 tablet   3    BP 134/75  Pulse 63  Temp(Src) 98.4 F (36.9 C) (Oral)  Resp 14  SpO2 97% Physical Exam  Nursing note and vitals reviewed. Constitutional: He is oriented to person, place, and time. He appears well-developed and well-nourished. No distress.  HENT:  Head:  Normocephalic and atraumatic.  Mouth/Throat: Oropharynx is clear and moist.  Eyes: Pupils are equal, round, and reactive to light.  Neck: Normal range of motion. Neck supple.  Cardiovascular: Normal rate, regular rhythm and normal heart sounds.  Exam reveals no gallop and no friction rub.   No murmur heard. Pulmonary/Chest: Effort normal and breath sounds normal. No respiratory distress.  Abdominal: Soft. Bowel sounds are normal. He exhibits no distension. There is no tenderness.  Musculoskeletal: He exhibits no edema.  Neurological: He is alert and oriented to person, place, and time. He exhibits normal muscle tone. Coordination normal.  Skin: Skin is warm and dry. No rash noted. No erythema.    ED Course  Procedures (including critical care time) Labs Reviewed  BASIC METABOLIC PANEL - Abnormal; Notable for the following:    GFR calc non Af Amer 66 (*)    GFR calc Af Amer 77 (*)    All other components within normal limits  CBC  POCT I-STAT TROPONIN I  POCT I-STAT TROPONIN I   No results found. 1. Chest pain, atypical   Patient is referred to his primary doctor and cardiology for recheck and evaluation asap.  Patient is advised to return here as needed.  The patient is advised that this could still be his heart and that is why eval is so important. The patient is PERC negative. The patient agrees to this plan. All questions were answered.   MDM  MDM Reviewed: vitals, nursing note and previous chart Reviewed previous: labs Interpretation: labs, ECG and x-ray    Date: 08/30/2012  Rate: 67  Rhythm: normal sinus rhythm  QRS Axis: normal  Intervals: normal  ST/T Wave abnormalities: normal  Conduction Disutrbances:Incomplete RBBB  Narrative Interpretation:   Old EKG Reviewed: unchanged      Carlyle Dolly, PA-C 08/30/12 1520

## 2012-08-30 NOTE — Progress Notes (Signed)
Tony Hunt Date of Birth  [birth year]       [facility] Office [address]  [address] [place], [place]  [address]   [place], [place]  [address] [phone]     [phone]   Fax  [phone]    Fax [phone]  Problem List: 1. CAD -  08/25/12 - 2.75 x 12 mm Promus Premier DES was deployed in the mid LAD. The stent was post-dilated with a 3.0 x 9 mm [place] balloon - Mid LAD. 2. Hypertension 3. Hypothyroidism 4. hyperlipidemia  History of Present Illness:  Tony Hunt is a 57 yo with hx of progressive CP.  These pains have progressed over the past several weeks.  Initially he had only one or 2 times per week. These pains have progressed and he now has several episodes of chest pain each day.  The pains are associated with some dyspnea and lightheadness.   They are not associated with exercise, eating, drinking, change of position. The pains seem to last about 1 minute.  He has not found anything that specifically relieves the pain. They tend to resolve spontaneously.  He had a particularly bad episode of chest discomfort early this week and went to the emergency room. His workup there was unremarkable.  He's had progressive angina. He had an episode of chest pain the office today. The there were no associated EKG changes.  August 30, 2012:  Tony Hunt was seen last week with [facility].  He had a stent placed in his mid left anterior descending artery. He was discharged the next day but started having severe headaches and night sweats. Initially he thought it may be due to the Plavix but ultimately he decided that it seemed to be more related to the Pravachol.  He's feeling much better at this time   Current Outpatient Prescriptions on File Prior to Visit  Medication Sig Dispense Refill  . aspirin EC 81 MG tablet Take 1 tablet (81 mg total) by mouth daily.  90 tablet  3  . clopidogrel (PLAVIX) 75 MG tablet Take 1 tablet (75 mg total) by mouth  daily.  90 tablet  3  . fish oil-omega-3 fatty acids 1000 MG capsule Take 1 g by mouth daily.      Marland Kitchen levothyroxine (SYNTHROID, LEVOTHROID) 100 MCG tablet Take 100 mcg by mouth daily before breakfast.      . lisinopril (PRINIVIL,ZESTRIL) 20 MG tablet Take 1 tablet (20 mg total) by mouth daily.  30 tablet  12  . metoprolol tartrate (LOPRESSOR) 25 MG tablet Take 1 tablet (25 mg total) by mouth 2 (two) times daily.  60 tablet  3  . nitroGLYCERIN (NITROSTAT) 0.4 MG SL tablet Place 1 tablet (0.4 mg total) under the tongue every 5 (five) minutes as needed for chest pain.  25 tablet  3  . pantoprazole (PROTONIX) 40 MG tablet Take 1 tablet (40 mg total) by mouth daily.  30 tablet  3   No current facility-administered medications on file prior to visit.    Allergies  Allergen Reactions  . Iohexol Anaphylaxis    OK with 13 hour prep  . Pravastatin Nausea Only and Other (See Comments)    Intense headache  . Codeine Itching  . Simvastatin     Edema  . Statins     Sig edema on simvastatin    Past Medical History  Diagnosis Date  . 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 HYPERLIPIDEMIA 05/10/2007  . 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 HYPERTENSION 05/10/2007  . ESOPHAGEAL STRICTURE 05/10/2007  . GERD 05/10/2007  . HIATAL HERNIA 05/10/2007  . SLEEP APNEA 05/10/2007  . ALCOHOL ABUSE, HX OF     distant history  . Upper airway cough syndrome     Per Dr. Sherene Sires, pulmonary   . 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 ANXIETY DEPRESSION 05/10/2007    history of, during difficult relationship  . Anxiety     occ panic sx, increased after death of mother    Past Surgical History  Procedure Laterality Date  . Back surgery  1997    lower back x3  . Wrist fracture surgery  08/21/10    Right wrist x4  . Colonoscopy    . Appendectomy      History  Smoking status  . Former Smoker  . Quit date: 09/29/2009  Smokeless tobacco    . Former Neurosurgeon   He is a retired Engineer, drilling.  He does lots of yard work, garden work, fishing.  History  Alcohol Use No    Family History  Problem Relation Age of Onset  . Brain cancer Father   . Dementia Father     h/o brain tumor  . Heart disease Mother   . Hypertension Mother   . Colon cancer Neg Hx   . Prostate cancer Neg Hx     Reviw of Systems:  Reviewed in the HPI.  All other systems are negative.  Physical Exam: Blood pressure 130/88, pulse 56, height 5\' 8"  (1.727 m), weight 194 lb (87.998 kg), SpO2 96.00%. General: Well developed, well nourished, in no acute distress.  Head: Normocephalic, atraumatic, sclera non-icteric, mucus membranes are moist,   Neck: Supple. Carotids are 2 + without bruits. No JVD   Lungs: Clear   Heart: RR, normal S1, S2  Abdomen: Soft, non-tender, non-distended with normal bowel sounds.  Msk:  Strength and tone are normal   Extremities: No clubbing or cyanosis. No edema.  Distal pedal pulses are 2+ and equal .  His  Right radial cath site is well healed. Pulse is 2 +   Neuro: CN II - XII intact.  Alert and oriented X 3.   Psych:  Normal   ECG:    Assessment / Plan:    Patient received 4 baby aspirin in the office today.

## 2012-08-31 ENCOUNTER — Encounter: Payer: Self-pay | Admitting: Family Medicine

## 2012-08-31 ENCOUNTER — Ambulatory Visit (INDEPENDENT_AMBULATORY_CARE_PROVIDER_SITE_OTHER): Payer: Medicare Other | Admitting: Family Medicine

## 2012-08-31 VITALS — BP 122/70 | HR 65 | Temp 97.9°F | Wt 196.2 lb

## 2012-08-31 DIAGNOSIS — E785 Hyperlipidemia, unspecified: Secondary | ICD-10-CM

## 2012-08-31 DIAGNOSIS — I2510 Atherosclerotic heart disease of native coronary artery without angina pectoris: Secondary | ICD-10-CM

## 2012-08-31 NOTE — Patient Instructions (Addendum)
Take care.  Gradually work on walking more.  Glad to see you.  If you have trouble with the atorvastatin, then let me or the cardiology clinic know about it.   Physical next spring.

## 2012-08-31 NOTE — Progress Notes (Signed)
Admitted with escalation of chest pain, had 2.75 x 12 mm Promus Premier DES was deployed in the mid LAD. The stent was post-dilated with a 3.0 x 9 mm [place] balloon - Mid LAD.  His sx are greatly improved.  Minimal chest pain. Hasn't had to use NTG.  He's going to try atorvastatin- took first dose last night.  He'll notify me if he has an intolerance.  Discussed.  Not SOB.  We talked about diet and exercise.   He is going to start back walking, discussed gradual return to exercise.    Meds, vitals, and allergies reviewed.   ROS: See HPI.  Otherwise, noncontributory.  GEN: nad, alert and oriented HEENT: mucous membranes moist NECK: supple w/o LA CV: rrr.  no murmur PULM: ctab, no inc wob ABD: soft, +bs EXT: no edema SKIN: no acute rash

## 2012-09-01 NOTE — ED Provider Notes (Signed)
Medical screening examination/treatment/procedure(s) were conducted as a shared visit with non-physician practitioner(s) and myself.  I personally evaluated the patient during the encounter  Doug Sou, MD 09/01/12 [phone]

## 2012-09-03 NOTE — Assessment & Plan Note (Signed)
Much improved.  App cards help.  D/w pt about his meds in detail.  Continue as is.  Appears to be doing well.

## 2012-09-03 NOTE — Assessment & Plan Note (Signed)
He'll try the statin and notify us if any ADE.  He agrees.

## 2012-09-07 ENCOUNTER — Telehealth: Payer: Self-pay

## 2012-09-07 DIAGNOSIS — N059 Unspecified nephritic syndrome with unspecified morphologic changes: Secondary | ICD-10-CM

## 2012-09-07 NOTE — Telephone Encounter (Signed)
Pt tried to scheduled yearly f/u appt with Dr Lacy Duverney, [facility]and was told needed referral from PCP. Please advise.

## 2012-09-07 NOTE — Telephone Encounter (Signed)
Placed referral.  Thanks.

## 2012-09-18 ENCOUNTER — Other Ambulatory Visit: Payer: Self-pay | Admitting: Otolaryngology

## 2012-09-18 DIAGNOSIS — Z859 Personal history of malignant neoplasm, unspecified: Secondary | ICD-10-CM

## 2012-09-20 ENCOUNTER — Ambulatory Visit
Admission: RE | Admit: 2012-09-20 | Discharge: 2012-09-20 | Disposition: A | Discharge status: Home / Self Care | Payer: Medicare Other | Source: Ambulatory Visit | Attending: Otolaryngology | Admitting: Otolaryngology

## 2012-09-20 DIAGNOSIS — Z859 Personal history of malignant neoplasm, unspecified: Secondary | ICD-10-CM

## 2012-09-20 MED ORDER — IOHEXOL 300 MG/ML  SOLN
75.0000 mL | Freq: Once | INTRAMUSCULAR | Status: AC | PRN
  Administered 2012-09-20: 75 mL via INTRAVENOUS

## 2012-09-21 ENCOUNTER — Other Ambulatory Visit: Payer: Self-pay | Admitting: Family Medicine

## 2012-10-24 ENCOUNTER — Other Ambulatory Visit: Payer: Self-pay | Admitting: Family Medicine

## 2012-10-24 NOTE — Telephone Encounter (Signed)
Electronic refill request. Our meds list shows that he is on Protonix but refill request is for Prilosec.  Please advise.

## 2012-10-25 NOTE — Telephone Encounter (Signed)
Please clarify with patient.

## 2012-10-26 NOTE — Addendum Note (Signed)
Addended by: Annamarie Major on: 10/26/2012 02:38 PM   Modules accepted: Orders

## 2012-11-01 ENCOUNTER — Encounter: Payer: Self-pay | Admitting: Cardiovascular Disease

## 2012-11-20 ENCOUNTER — Other Ambulatory Visit (INDEPENDENT_AMBULATORY_CARE_PROVIDER_SITE_OTHER): Payer: Medicare Other

## 2012-11-20 DIAGNOSIS — E785 Hyperlipidemia, unspecified: Secondary | ICD-10-CM

## 2012-11-20 DIAGNOSIS — I2510 Atherosclerotic heart disease of native coronary artery without angina pectoris: Secondary | ICD-10-CM

## 2012-11-20 LAB — LIPID PANEL
Cholesterol: 187 mg/dL (ref 0–200)
HDL: 32.5 mg/dL — ABNORMAL LOW (ref >39.00)
Triglycerides: 356 mg/dL — ABNORMAL HIGH (ref 0.0–149.0)
VLDL: 71.2 mg/dL — ABNORMAL HIGH (ref 0.0–40.0)

## 2012-11-20 LAB — HEPATIC FUNCTION PANEL
Albumin: 3.9 g/dL (ref 3.5–5.2)
Total Protein: 7 g/dL (ref 6.0–8.3)

## 2012-11-20 LAB — LDL CHOLESTEROL, DIRECT: Direct LDL: 97.8 mg/dL

## 2012-11-20 LAB — BASIC METABOLIC PANEL
GFR: 59.12 mL/min — ABNORMAL LOW (ref >60.00)
Glucose, Bld: 89 mg/dL (ref 70–99)
Potassium: 4.1 mEq/L (ref 3.5–5.1)
Sodium: 139 mEq/L (ref 135–145)

## 2012-11-21 ENCOUNTER — Ambulatory Visit (INDEPENDENT_AMBULATORY_CARE_PROVIDER_SITE_OTHER): Payer: Medicare Other | Admitting: Cardiovascular Disease

## 2012-11-21 ENCOUNTER — Encounter: Payer: Self-pay | Admitting: Cardiovascular Disease

## 2012-11-21 VITALS — BP 136/72 | HR 52 | Ht 68.0 in | Wt 200.0 lb

## 2012-11-21 DIAGNOSIS — E785 Hyperlipidemia, unspecified: Secondary | ICD-10-CM

## 2012-11-21 DIAGNOSIS — I2510 Atherosclerotic heart disease of native coronary artery without angina pectoris: Secondary | ICD-10-CM

## 2012-11-21 NOTE — Patient Instructions (Signed)
Your physician wants you to follow-up in: 1 year  You will receive a reminder letter in the mail two months in advance. If you don't receive a letter, please call our office to schedule the follow-up appointment.   Your physician recommends that you continue on your current medications as directed. Please refer to the Current Medication list given to you today.    [facility] Low Glycemic Diet (Source: [facility], 2006) Low Glycemic Foods (20-49) (Decrease risk of developing heart disease) Breakfast Cereals: All-Bran All-Bran Fruit 'n Oats Fiber One Oatmeal (not instant) Oat bran Fruits and fruit juices: (Limit to 1-2 servings per day) Apples Apricots (fresh & dried) Blackberries Blueberries Cherries Cranberries Peaches Pears Plums Prunes Grapefruit Raspberries Strawberries Tangerine Apple juice Grapefruit juice Tomato juice Beans and legumes (fresh-cooked): Black-eyed peas Butter beans Chick peas Lentils  Green beans Lima beans Kidney beans Navy beans Pinto beans Snow peas Non-starchy vegetables: Asparagus, avocado, broccoli, cabbage, cauliflower, celery, cucumber, greens, lettuce, mushrooms, peppers, tomatoes, okra, onions, spinach, summer squash Grains: Barley Bulgur Rye Wild rice Nuts and oils : Almonds Peanuts Sunflower seeds Hazelnuts Pecans Walnuts Oils that are liquid at room temperature Dairy, fish, meat, soy, and eggs: Milk, skim Lowfat cheese Yogurt, lowfat, fruit sugar sweetened Lean red meat Fish  Skinless chicken & turkey Shellfish Egg whites (up to 3 daily) Soy products  Egg yolks (up to 7 or _____ per week) Moderate Glycemic Foods (50-69) Breakfast Cereals: Bran Buds Bran Chex Just Right Mini-Wheats  Special K Swiss muesli Fruits: Banana (under-ripe) Dates Figs Grapes Kiwi Mango Oranges Raisins Fruit Juices: Cranberry juice Orange juice Beans and legumes: Boston-type baked beans Canned pinto, kidney, or navy  beans Green peas Vegetables: Beets Carrots  Sweet potato Yam Corn on the cob Breads: Pita (pocket) bread Oat bran bread Pumpernickel bread Rye bread Wheat bread, high fiber  Grains: Cornmeal Rice, brown Rice, white Couscous Pasta: Macaroni Pizza, cheese Ravioli, meat filled Spaghetti, white  Nuts: Cashews Macadamia Snacks: Chocolate Ice cream, lowfat Muffin Popcorn High Glycemic Foods (70-100)  Breakfast Cereals: Cheerios Corn Chex Corn Flakes Cream of Wheat Grape Nuts Grape Nut Flakes Grits Nutri-Grain Puffed Rice Puffed Wheat Rice Chex Rice Krispies Shredded Wheat Team Total Fruits: Pineapple Watermelon Banana (over-ripe) Beverages: Sodas, sweet tea, pineapple juice Vegetables: Potato, baked, boiled, fried, mashed French fries Canned or frozen corn Parsnips Winter squash Breads: Most breads (white and whole grain) Bagels Bread sticks Bread stuffing Kaiser roll Dinner rolls Grains: Rice, instant Tapioca, with milk Candy and most cookies Snacks: Donuts Corn chips Jelly beans Pretzels Pastries     

## 2012-11-21 NOTE — Progress Notes (Signed)
Tony Hunt Date of Birth  04/13/1955       [facility] [address]  [address] [place], [place]  [address]   [place], [place]  [address] [phone]     [phone]   Fax  [phone]    Fax [phone]  Problem List: 1. CAD -  08/25/12 - 2.75 x 12 mm Promus Premier DES was deployed in the mid LAD. The stent was post-dilated with a 3.0 x 9 mm Lyons balloon - Mid LAD. 2. Hypertension 3. Hypothyroidism 4. hyperlipidemia  History of Present Illness:  Tony Hunt is a 57 yo with hx of progressive CP.  These pains have progressed over the past several weeks.  Initially he had only one or 2 times per week. These pains have progressed and he now has several episodes of chest pain each day.  The pains are associated with some dyspnea and lightheadness.   They are not associated with exercise, eating, drinking, change of position. The pains seem to last about 1 minute.  He has not found anything that specifically relieves the pain. They tend to resolve spontaneously.  He had a particularly bad episode of chest discomfort early this week and went to the emergency room. His workup there was unremarkable.  He's had progressive angina. He had an episode of chest pain the office today. The there were no associated EKG changes.  August 30, 2012:  Tony Hunt was seen last week with [facility].  He had a stent placed in his mid left anterior descending artery. He was discharged the next day but started having severe headaches and night sweats. Initially he thought it may be due to the Plavix but ultimately he decided that it seemed to be more related to the Pravachol.  He's feeling much better at this time.  Sept. 23, 2014:  He had several weeks of intermittant CP ( lasted 2-3 minutes) after getting his stent.  Now , these have resolved.  He is back doing all of his normal activities without CP.  He joined the [employer] last week.  He has been 1 time.   He has  noticed some easy bleeding.     Current Outpatient Prescriptions on File Prior to Visit  Medication Sig Dispense Refill  . aspirin EC 81 MG tablet Take 1 tablet (81 mg total) by mouth daily.  90 tablet  3  . atorvastatin (LIPITOR) 20 MG tablet Take 1 tablet (20 mg total) by mouth daily.  90 tablet  3  . clopidogrel (PLAVIX) 75 MG tablet Take 75 mg by mouth daily.      . fish oil-omega-3 fatty acids 1000 MG capsule Take 1 g by mouth daily.      Marland Kitchen levothyroxine (SYNTHROID, LEVOTHROID) 100 MCG tablet Take 100 mcg by mouth daily before breakfast.      . metoprolol tartrate (LOPRESSOR) 25 MG tablet Take 1 tablet (25 mg total) by mouth 2 (two) times daily.  60 tablet  3  . nitroGLYCERIN (NITROSTAT) 0.4 MG SL tablet Place 1 tablet (0.4 mg total) under the tongue every 5 (five) minutes as needed for chest pain.  25 tablet  3  . lisinopril (PRINIVIL,ZESTRIL) 20 MG tablet TAKE 1 TABLET BY MOUTH EVERY DAY  30 tablet  11   No current facility-administered medications on file prior to visit.    Allergies  Allergen Reactions  . Iohexol Anaphylaxis    OK with 13  hour prep  . Pravastatin Nausea Only and Other (See Comments)    Intense headache  . Codeine Itching  . Simvastatin     Edema  . Statins     Sig edema on simvastatin    Past Medical History  Diagnosis Date  . 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 HYPERLIPIDEMIA 05/10/2007  . 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 HYPERTENSION 05/10/2007  . ESOPHAGEAL STRICTURE 05/10/2007  . GERD 05/10/2007  . HIATAL HERNIA 05/10/2007  . SLEEP APNEA 05/10/2007  . ALCOHOL ABUSE, HX OF     distant history  . Upper airway cough syndrome     Per Dr. Sherene Sires, pulmonary   . 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 ANXIETY DEPRESSION 05/10/2007    history of, during difficult relationship  . Anxiety     occ panic sx, increased after death of mother  . Heart disease     Past Surgical  History  Procedure Laterality Date  . Back surgery  1997    lower back x3  . Wrist fracture surgery  08/21/10    Right wrist x4  . Colonoscopy    . Appendectomy    . Coronary stent placement      2.75 x 12 mm Promus Premier DES was deployed in the mid LAD. The stent was post-dilated with a 3.0 x 9 mm [place] balloon - Mid LAD.    History  Smoking status  . Former Smoker  . Quit date: 09/29/2009  Smokeless tobacco  . Former Neurosurgeon   He is a retired Engineer, drilling.  He does lots of yard work, garden work, fishing.  History  Alcohol Use No    Family History  Problem Relation Age of Onset  . Brain cancer Father   . Dementia Father     h/o brain tumor  . Heart disease Mother   . Hypertension Mother   . Colon cancer Neg Hx   . Prostate cancer Neg Hx   . Heart disease Brother     Reviw of Systems:  Reviewed in the HPI.  All other systems are negative.  Physical Exam: Blood pressure 136/72, pulse 52, height 5\' 8"  (1.727 m), weight 200 lb (90.719 kg), SpO2 98.00%. General: Well developed, well nourished, in no acute distress.  Head: Normocephalic, atraumatic, sclera non-icteric, mucus membranes are moist,   Neck: Supple. Carotids are 2 + without bruits. No JVD   Lungs: Clear   Heart: RR, normal S1, S2  Abdomen: Soft, non-tender, non-distended with normal bowel sounds.  Msk:  Strength and tone are normal   Extremities: No clubbing or cyanosis. No edema.  Distal pedal pulses are 2+ and equal .  His  Right radial cath site is well healed. Pulse is 2 +   Neuro: CN II - XII intact.  Alert and oriented X 3.   Psych:  Normal   ECG:    Assessment / Plan:    Patient received 4 baby aspirin in the office today.

## 2012-11-21 NOTE — Assessment & Plan Note (Signed)
his Trigs are markedly elevated.  He needs to work in a low fat, low carb diet.

## 2012-11-21 NOTE — Assessment & Plan Note (Signed)
He is doing well.  He had a few episodes of CP initially but has done well for the past month or so.  His cholesterol levels are ok but his Trigs are markedly elevated.    We discussed an improved diet.    I will see him in 1 year - we will check  ECG, lipids, liver enzymes, and electrolytes at that time.  He will see his medical doctor in 6 months.

## 2012-11-22 NOTE — Progress Notes (Signed)
Seen in office/ reviewed with dr

## 2012-12-17 ENCOUNTER — Other Ambulatory Visit (HOSPITAL_COMMUNITY): Payer: Self-pay | Admitting: Physician Assistant

## 2012-12-19 ENCOUNTER — Other Ambulatory Visit (HOSPITAL_COMMUNITY): Payer: Self-pay | Admitting: Physician Assistant

## 2012-12-21 ENCOUNTER — Ambulatory Visit (INDEPENDENT_AMBULATORY_CARE_PROVIDER_SITE_OTHER): Payer: Medicare Other | Admitting: Family Medicine

## 2012-12-21 ENCOUNTER — Encounter: Payer: Self-pay | Admitting: Family Medicine

## 2012-12-21 VITALS — BP 122/70 | HR 70 | Temp 97.6°F | Wt 201.2 lb

## 2012-12-21 DIAGNOSIS — M109 Gout, unspecified: Secondary | ICD-10-CM | POA: Insufficient documentation

## 2012-12-21 MED ORDER — COLCHICINE 0.6 MG PO TABS: ORAL_TABLET | ORAL | Status: DC

## 2012-12-21 MED ORDER — OXYCODONE HCL 5 MG PO TABS: 5.0000 mg | ORAL_TABLET | Freq: Four times a day (QID) | ORAL | Status: DC | PRN

## 2012-12-21 NOTE — Assessment & Plan Note (Signed)
Likely dx.  D/w pt.  Would hold off on imaging and labs for now as it wouldn't likely change mgmt.  He agrees.  Path/phys d/w pt.  Handout given.  Use colchicine for now, statin caution.  Oxycodone prn for pain.  Call back is needed.   We can consider proph meds if he has more flares.

## 2012-12-21 NOTE — Progress Notes (Signed)
Yesterday afternoon with burning on the L dorsum of the foot.  Worse last night.  No trauma, no trigger.  H/o similar about 1 year ago, but it resolved on its own.  No known h/o gout.  Pain with the sheet touching the foot last night.  No fevers.  No drainage from the skin.  He can weight bear if he puts weight on the L lateral side of the foot.   Meds, vitals, and allergies reviewed.   ROS: See HPI.  Otherwise, noncontributory.  nad L foot with normal inspection and not ttp except for: TTP at L 1st MCP and ttp/puffy proximally dorsally DP pulse intact.  No fluctuant mass.  Normal cap refill distally.  Ankle and shin not ttp

## 2012-12-21 NOTE — Patient Instructions (Addendum)
This looks like gout.  Take colchicine for pain and then the oxycodone if still hurting.   If you have another flare or don't improve then let me know.   Skip the lipitor when taking colchicine.   Take care.

## 2012-12-25 ENCOUNTER — Other Ambulatory Visit (HOSPITAL_COMMUNITY): Payer: Self-pay | Admitting: Physician Assistant

## 2012-12-25 NOTE — Telephone Encounter (Addendum)
Verify if he is still on prilosec vs protonix. Let me know if he needs a refill.  Appears refill request for PPI went to another [place] clinic.  Thanks.

## 2012-12-26 NOTE — Telephone Encounter (Signed)
Patient was taking Prilosec but he says Cardiology switched him to Protonix.

## 2012-12-26 NOTE — Telephone Encounter (Signed)
rx sent.  Thanks.  

## 2013-01-08 ENCOUNTER — Encounter: Payer: Self-pay | Admitting: Family Medicine

## 2013-01-08 ENCOUNTER — Ambulatory Visit (INDEPENDENT_AMBULATORY_CARE_PROVIDER_SITE_OTHER): Payer: Medicare Other | Admitting: Family Medicine

## 2013-01-08 VITALS — BP 122/76 | HR 72 | Temp 98.4°F | Wt 200.8 lb

## 2013-01-08 DIAGNOSIS — L989 Disorder of the skin and subcutaneous tissue, unspecified: Secondary | ICD-10-CM

## 2013-01-08 NOTE — Progress Notes (Signed)
His presumed gout flare resolved after a few days.  He took colchicine and he improved.  No ADE on the medicine.    Skin lesion on his chest.  Present for years. Itching now.  Bigger over the last 6 months.    nad 12 x 10mm fleshy lesion on anterior mid chest

## 2013-01-08 NOTE — Assessment & Plan Note (Signed)
Possible (but not highly likely) SCC.  May need a biopsy, wanted derm referral.  Given brevity of encounter, no charge for visit. D/w pt.  He agrees.

## 2013-01-08 NOTE — Progress Notes (Signed)
Pre-visit discussion using our clinic review tool. No additional management support is needed unless otherwise documented below in the visit note.  

## 2013-01-08 NOTE — Patient Instructions (Addendum)
Tony Hunt will call about your referral. No charge on visit.

## 2013-01-15 ENCOUNTER — Other Ambulatory Visit: Payer: Self-pay | Admitting: *Deleted

## 2013-01-15 DIAGNOSIS — E785 Hyperlipidemia, unspecified: Secondary | ICD-10-CM

## 2013-01-15 DIAGNOSIS — I2510 Atherosclerotic heart disease of native coronary artery without angina pectoris: Secondary | ICD-10-CM

## 2013-01-15 MED ORDER — ATORVASTATIN CALCIUM 20 MG PO TABS: 20.0000 mg | ORAL_TABLET | Freq: Every day | ORAL | Status: DC

## 2013-01-16 ENCOUNTER — Other Ambulatory Visit: Payer: Self-pay

## 2013-01-16 MED ORDER — CLOPIDOGREL BISULFATE 75 MG PO TABS: ORAL_TABLET | ORAL | Status: DC

## 2013-01-17 ENCOUNTER — Other Ambulatory Visit: Payer: Self-pay | Admitting: *Deleted

## 2013-01-17 MED ORDER — LISINOPRIL 20 MG PO TABS: ORAL_TABLET | ORAL | Status: DC

## 2013-01-17 NOTE — Telephone Encounter (Signed)
Received fax from [facility] [address]  requesting 90 day supply.

## 2013-01-31 ENCOUNTER — Telehealth: Payer: Self-pay

## 2013-01-31 NOTE — Telephone Encounter (Signed)
Pam with [facility] said pt had numbness and pain in rt and lt arm 1AM this morning; pain eased. Pt resumed with pain again and neck pain also. Pt cannot hold anything in lt hand without assistance from rt hand; no H/a, dizziness, CP, SOB or fever. Dr Para March said pt needs evaluation at ED now. Pam will advise pt.

## 2013-01-31 NOTE — Telephone Encounter (Signed)
Agree with ER.  Thanks.  

## 2013-01-31 NOTE — Telephone Encounter (Signed)
Patient Information:  Caller Name: Tony Hunt  Phone: [phone]  Patient: Tony Hunt  Gender: Male  DOB: Mar 15, 1955  Age: 57 Years  PCP: [name] Clelia Croft) [facility])  Office Follow Up:  Does the office need to follow up with this patient?: No  Instructions For The Office: N/A  RN Note:  Pt reports L & R hand numbess that began at 0100 this AM. Sx eased but returned at 1430 today w/neck pain. Is unable to hold a glass in his L hand w/o using his R hand. Denies HA, dizziness, SOB or chest pain. RN reported to [place] office nurse who speaks to Dr. Para March. Advised [facility] ED now. Pt is contacted. Voices understanding. Is waiting on his ride who should arrive very soon.   Symptoms  Reason For Call & Symptoms: Numbness  Reviewed Health History In EMR: Yes  Reviewed Medications In EMR: Yes  Reviewed Allergies In EMR: Yes  Reviewed Surgeries / Procedures: Yes  Date of Onset of Symptoms: 01/31/2013  Guideline(s) Used:  Neurologic Deficit  Disposition Per Guideline:   Go to ED Now (or to Office with PCP Approval)  Reason For Disposition Reached:   Can't use hand normally (e.g., hold a glass of water)  Advice Given:  Call Back If:  You become worse.  RN Overrode Recommendation:  Document Patient  RN will call ofc.

## 2013-02-02 ENCOUNTER — Encounter: Payer: Self-pay | Admitting: Family Medicine

## 2013-02-02 ENCOUNTER — Ambulatory Visit (INDEPENDENT_AMBULATORY_CARE_PROVIDER_SITE_OTHER): Payer: Medicare Other | Admitting: Family Medicine

## 2013-02-02 VITALS — BP 110/78 | HR 73 | Temp 98.3°F | Wt 209.0 lb

## 2013-02-02 DIAGNOSIS — M791 Myalgia, unspecified site: Secondary | ICD-10-CM

## 2013-02-02 DIAGNOSIS — IMO0001 Reserved for inherently not codable concepts without codable children: Secondary | ICD-10-CM

## 2013-02-02 LAB — CBC WITH DIFFERENTIAL/PLATELET
Basophils Absolute: 0 10*3/uL (ref 0.0–0.1)
Eosinophils Relative: 2.6 % (ref 0.0–5.0)
HCT: 42.2 % (ref 39.0–52.0)
Hemoglobin: 14.2 g/dL (ref 13.0–17.0)
Lymphocytes Relative: 20 % (ref 12.0–46.0)
Lymphs Abs: 1.4 10*3/uL (ref 0.7–4.0)
MCHC: 33.6 g/dL (ref 30.0–36.0)
Monocytes Absolute: 0.5 10*3/uL (ref 0.1–1.0)
Monocytes Relative: 7.2 % (ref 3.0–12.0)
Neutrophils Relative %: 69.8 % (ref 43.0–77.0)
Platelets: 338 10*3/uL (ref 150.0–400.0)
WBC: 6.8 10*3/uL (ref 4.5–10.5)

## 2013-02-02 LAB — CK: Total CK: 62 U/L (ref 7–232)

## 2013-02-02 LAB — SEDIMENTATION RATE: Sed Rate: 41 mm/hr — ABNORMAL HIGH (ref 0–22)

## 2013-02-02 LAB — COMPREHENSIVE METABOLIC PANEL
CO2: 28 mEq/L (ref 19–32)
Calcium: 10 mg/dL (ref 8.4–10.5)
Chloride: 102 mEq/L (ref 96–112)
Creatinine, Ser: 1.3 mg/dL (ref 0.4–1.5)
GFR: 58.06 mL/min — ABNORMAL LOW (ref >60.00)
Glucose, Bld: 103 mg/dL — ABNORMAL HIGH (ref 70–99)
Total Bilirubin: 0.4 mg/dL (ref 0.3–1.2)
Total Protein: 7.1 g/dL (ref 6.0–8.3)

## 2013-02-02 NOTE — Progress Notes (Signed)
Pre-visit discussion using our clinic review tool. No additional management support is needed unless otherwise documented below in the visit note.  B diffuse arm pain.  Started Wed PM, early Thursday AM.  It got better Thursday.  Then started again last night, still painful now.  No leg sx.  Neck is sore but not stiff.  Arms feel weak from pain.  No trauma.  See prev note about arm/hand weakness. It's not in the joints but in the muscles in the arms, forearms, hands, fingers. Stinging pain, burning pain.  No rash.  No pain on the trunk.  No sx like this prev.  On lipitor now.    No fever, speech changes. Using a heating pad on the arms seems to help.  No trauma.  No tick bites.  Sx are not limited to a specific dermatome on either arm, they appear to be circumferential.   Meds, vitals, and allergies reviewed.   ROS: See HPI.  Otherwise, noncontributory.  GEN: nad, alert and oriented HEENT: mucous membranes moist NECK: supple w/o LA, neck not stiff CV: rrr.  PULM: ctab, no inc wob ABD: soft, +bs EXT: no edema SKIN: no acute rash CN 2-12 wnl B, S/S/DTR wnl x4, no weakness.  No synovitis in the BUE

## 2013-02-02 NOTE — Patient Instructions (Signed)
Go to the lab on the way out.  We'll contact you with your lab report. Stop the lipitor for now.  Give me an update Monday.

## 2013-02-04 DIAGNOSIS — IMO0001 Reserved for inherently not codable concepts without codable children: Secondary | ICD-10-CM | POA: Insufficient documentation

## 2013-02-04 NOTE — Assessment & Plan Note (Signed)
Nontoxic, no weakness, no clear source. This isn't unilateral or limited to the proximal muscles.  This doesn't appear to be neurologic or arthritic. His labs were fine except for a minimal elevation in his sed rate.  This may not be significant at all. We'll get update on the patient Monday.  Stop statin in meantime. >25 min spent with face to face with patient, >50% counseling and/or coordinating care.

## 2013-02-05 ENCOUNTER — Telehealth: Payer: Self-pay | Admitting: *Deleted

## 2013-02-05 NOTE — Telephone Encounter (Signed)
Left message with male voice answering this phone to have patient return call.

## 2013-02-05 NOTE — Telephone Encounter (Signed)
I'm glad it is some better.  Stay off the lipitor for now and let me know how he is at the end of the week.  We'll go from there. I was hoping he would improve some with stopping the statin.  Thanks.

## 2013-02-05 NOTE — Telephone Encounter (Signed)
Pt calls to give you update. He says his muscles are still sore, but not as much as before, and his R hand is still numb.

## 2013-02-06 NOTE — Telephone Encounter (Signed)
Patient advised.

## 2013-02-09 NOTE — Telephone Encounter (Signed)
Called Mr. Turnbough and got his voicemail.  Left message if pain is severe to go to the ER, if moderate can be seen at [facility] location.  Also advised to call our office on Monday to schedule an appointment with Dr. Para March for re-eval.

## 2013-02-09 NOTE — Telephone Encounter (Signed)
Pt called back right arm and shoulder pain is just as bad as when seen 02/02/13; pain got worse last night. No CP or SOB. Pain in arm and shoulder is worse when raises arm. Pain level now is 8. [facility] [address] Pt request cb today.

## 2013-02-09 NOTE — Telephone Encounter (Signed)
Notify pt that if pain is severe go to ER, if moderate got to [facility] .Marland Kitchen Make him an appt for re-eval.

## 2013-02-12 NOTE — Telephone Encounter (Signed)
Please see about getting him back in for a recheck. Thanks.

## 2013-02-13 ENCOUNTER — Telehealth (HOSPITAL_COMMUNITY): Payer: Self-pay | Admitting: Cardiac Rehabilitation

## 2013-02-13 NOTE — Telephone Encounter (Signed)
Left message on voice mail  to call back

## 2013-02-13 NOTE — Telephone Encounter (Signed)
Multiple attempts to contact pt to enroll in cardiac rehab. Letter mailed to pt home without response.  Last conversation with pt he was awaiting insurance information.

## 2013-02-16 NOTE — Telephone Encounter (Signed)
Appointment scheduled.

## 2013-02-19 ENCOUNTER — Encounter: Payer: Self-pay | Admitting: Family Medicine

## 2013-02-19 ENCOUNTER — Ambulatory Visit (INDEPENDENT_AMBULATORY_CARE_PROVIDER_SITE_OTHER): Payer: Medicare Other | Admitting: Family Medicine

## 2013-02-19 ENCOUNTER — Ambulatory Visit (INDEPENDENT_AMBULATORY_CARE_PROVIDER_SITE_OTHER)
Admission: RE | Admit: 2013-02-19 | Discharge: 2013-02-19 | Disposition: A | Discharge status: Home / Self Care | Payer: Medicare Other | Source: Ambulatory Visit | Attending: Family Medicine | Admitting: Family Medicine

## 2013-02-19 VITALS — BP 102/64 | HR 68 | Temp 98.0°F | Wt 206.5 lb

## 2013-02-19 DIAGNOSIS — M542 Cervicalgia: Secondary | ICD-10-CM

## 2013-02-19 DIAGNOSIS — IMO0001 Reserved for inherently not codable concepts without codable children: Secondary | ICD-10-CM

## 2013-02-19 LAB — SEDIMENTATION RATE: Sed Rate: 7 mm/hr (ref 0–22)

## 2013-02-19 MED ORDER — OXYCODONE HCL 5 MG PO TABS: 5.0000 mg | ORAL_TABLET | Freq: Four times a day (QID) | ORAL | Status: DC | PRN

## 2013-02-19 NOTE — Patient Instructions (Signed)
Go to the lab on the way out.  We'll contact you with your lab and xray report. We may have to get a neck MRI set up soon.  Take the oxycodone for pain.  Take care.

## 2013-02-19 NOTE — Progress Notes (Signed)
Pre-visit discussion using our clinic review tool. No additional management support is needed unless otherwise documented below in the visit note.  Continue with B diffuse proximal arm pain. Was fluctuating prev, but now more consistent. Still with no leg sx. Neck is sore but not stiff. Arms feel weak from pain but not weak per se.  No trauma. See prev note about arm/hand weakness.  It's not in the joints in the arms but mainly in the muscles in the upper arms, shoulder girdle B.  Stinging pain, burning pain. No rash. No pain on the trunk. Stopping lipitor didn't help, off med now.   No fever, speech changes. Using a heating pad on the arms seems to help. No trauma. No tick bites. Some better with massage of the muscles on the upper back.  No midline back pain.  Pain with ROM of neck noted.  Pain can radiation from the neck down to the upper arms.    Meds, vitals, and allergies reviewed.   ROS: See HPI.  Otherwise, noncontributory.  nad but uncomfortable Neck supple, with normal ROM but pain at extremes of ROM in all directions and that radiates to the arms B No midline neck pain No neck LA rrr ctab abd soft Back w/o CVA pain B traps ttp with muscle spasms noted Pain with ROM of the shoulders B, but this appears to originate in the trap B.  No arm drop, no weakness.  Joint line at [facility] joint not ttp Biceps and triceps not ttp Normal ROM at the elbow S/S/DTS wnl B

## 2013-02-20 NOTE — Assessment & Plan Note (Signed)
Repeat ESR neg.  Unlikely to be PMR, I doubt rheum dx in favor of mechanical cause, either neck or shoulder.  It would be atypical for B shoulder pain like this to originate separately and concurrently. Would check MR.  H/o anxiety related to being in scanner, will try to get open MRI.  Would not need contrast.  Use oxycodone for pain in meantime. Doesn't appear to be statin related. >25 min spent with face to face with patient, >50% counseling and/or coordinating care.

## 2013-02-21 ENCOUNTER — Other Ambulatory Visit: Payer: Self-pay | Admitting: Family Medicine

## 2013-02-21 MED ORDER — DIAZEPAM 5 MG PO TABS: 2.5000 mg | ORAL_TABLET | Freq: Once | ORAL | Status: DC

## 2013-02-21 NOTE — Progress Notes (Signed)
Please call in valium to use before MRI- will need a driver to take him home after taking medicine.  Thanks.

## 2013-02-21 NOTE — Progress Notes (Signed)
Rx called to pharmacy as instructed. Patient notified by telephone. 

## 2013-03-06 ENCOUNTER — Ambulatory Visit
Admission: RE | Admit: 2013-03-06 | Discharge: 2013-03-06 | Disposition: A | Discharge status: Home / Self Care | Payer: Medicare Other | Source: Ambulatory Visit | Attending: Family Medicine | Admitting: Family Medicine

## 2013-03-06 DIAGNOSIS — IMO0001 Reserved for inherently not codable concepts without codable children: Secondary | ICD-10-CM

## 2013-03-06 DIAGNOSIS — M542 Cervicalgia: Secondary | ICD-10-CM

## 2013-04-02 ENCOUNTER — Other Ambulatory Visit: Payer: Self-pay | Admitting: Family Medicine

## 2013-06-21 ENCOUNTER — Other Ambulatory Visit: Payer: Self-pay | Admitting: Family Medicine

## 2013-06-21 DIAGNOSIS — E039 Hypothyroidism, unspecified: Secondary | ICD-10-CM

## 2013-06-21 DIAGNOSIS — M109 Gout, unspecified: Secondary | ICD-10-CM

## 2013-06-21 DIAGNOSIS — E785 Hyperlipidemia, unspecified: Secondary | ICD-10-CM

## 2013-06-27 ENCOUNTER — Other Ambulatory Visit (INDEPENDENT_AMBULATORY_CARE_PROVIDER_SITE_OTHER): Payer: Medicare Other

## 2013-06-27 DIAGNOSIS — E785 Hyperlipidemia, unspecified: Secondary | ICD-10-CM

## 2013-06-27 DIAGNOSIS — R7309 Other abnormal glucose: Secondary | ICD-10-CM

## 2013-06-27 DIAGNOSIS — M109 Gout, unspecified: Secondary | ICD-10-CM

## 2013-06-27 DIAGNOSIS — I10 Essential (primary) hypertension: Secondary | ICD-10-CM

## 2013-06-27 DIAGNOSIS — E039 Hypothyroidism, unspecified: Secondary | ICD-10-CM

## 2013-06-27 LAB — COMPREHENSIVE METABOLIC PANEL
ALT: 19 U/L (ref 0–53)
AST: 23 U/L (ref 0–37)
Albumin: 4 g/dL (ref 3.5–5.2)
Alkaline Phosphatase: 76 U/L (ref 39–117)
BILIRUBIN TOTAL: 0.7 mg/dL (ref 0.3–1.2)
BUN: 26 mg/dL — ABNORMAL HIGH (ref 6–23)
CHLORIDE: 107 meq/L (ref 96–112)
CO2: 24 mEq/L (ref 19–32)
CREATININE: 1.5 mg/dL (ref 0.4–1.5)
Calcium: 9.7 mg/dL (ref 8.4–10.5)
GFR: 50.52 mL/min — ABNORMAL LOW (ref >60.00)
Glucose, Bld: 101 mg/dL — ABNORMAL HIGH (ref 70–99)
Potassium: 4.7 mEq/L (ref 3.5–5.1)
Sodium: 137 mEq/L (ref 135–145)
Total Protein: 7.4 g/dL (ref 6.0–8.3)

## 2013-06-27 LAB — LIPID PANEL
CHOL/HDL RATIO: 7
Cholesterol: 205 mg/dL — ABNORMAL HIGH (ref 0–200)
HDL: 27.7 mg/dL — AB (ref >39.00)
LDL Cholesterol: 117 mg/dL — ABNORMAL HIGH (ref 0–99)
TRIGLYCERIDES: 300 mg/dL — AB (ref 0.0–149.0)
VLDL: 60 mg/dL — AB (ref 0.0–40.0)

## 2013-06-27 LAB — URIC ACID: Uric Acid, Serum: 10.6 mg/dL — ABNORMAL HIGH (ref 4.0–7.8)

## 2013-06-27 LAB — TSH: TSH: 1.73 u[IU]/mL (ref 0.35–5.50)

## 2013-07-02 ENCOUNTER — Encounter: Payer: Self-pay | Admitting: Family Medicine

## 2013-07-02 ENCOUNTER — Ambulatory Visit (INDEPENDENT_AMBULATORY_CARE_PROVIDER_SITE_OTHER): Payer: Medicare Other | Admitting: Family Medicine

## 2013-07-02 VITALS — BP 112/70 | HR 63 | Temp 98.3°F | Ht 67.0 in | Wt 207.2 lb

## 2013-07-02 DIAGNOSIS — I10 Essential (primary) hypertension: Secondary | ICD-10-CM

## 2013-07-02 DIAGNOSIS — Z23 Encounter for immunization: Secondary | ICD-10-CM

## 2013-07-02 DIAGNOSIS — I2510 Atherosclerotic heart disease of native coronary artery without angina pectoris: Secondary | ICD-10-CM

## 2013-07-02 DIAGNOSIS — E039 Hypothyroidism, unspecified: Secondary | ICD-10-CM

## 2013-07-02 DIAGNOSIS — M109 Gout, unspecified: Secondary | ICD-10-CM

## 2013-07-02 DIAGNOSIS — Z0000 Encounter for general adult medical examination without abnormal findings: Secondary | ICD-10-CM

## 2013-07-02 DIAGNOSIS — E785 Hyperlipidemia, unspecified: Secondary | ICD-10-CM

## 2013-07-02 MED ORDER — METOPROLOL TARTRATE 25 MG PO TABS: 25.0000 mg | ORAL_TABLET | Freq: Two times a day (BID) | ORAL | Status: DC

## 2013-07-02 MED ORDER — PANTOPRAZOLE SODIUM 40 MG PO TBEC: DELAYED_RELEASE_TABLET | ORAL | Status: DC

## 2013-07-02 MED ORDER — NITROGLYCERIN 0.4 MG SL SUBL: 0.4000 mg | SUBLINGUAL_TABLET | SUBLINGUAL | Status: DC | PRN

## 2013-07-02 MED ORDER — LISINOPRIL 20 MG PO TABS: ORAL_TABLET | ORAL | Status: DC

## 2013-07-02 MED ORDER — LEVOTHYROXINE SODIUM 100 MCG PO TABS: 100.0000 ug | ORAL_TABLET | Freq: Every day | ORAL | Status: DC

## 2013-07-02 MED ORDER — ATORVASTATIN CALCIUM 20 MG PO TABS: 20.0000 mg | ORAL_TABLET | Freq: Every day | ORAL | Status: DC

## 2013-07-02 NOTE — Progress Notes (Signed)
Pre visit review using our clinic review tool, if applicable. No additional management support is needed unless otherwise documented below in the visit note.  I have personally reviewed the Medicare Annual Wellness questionnaire and have noted 1. The patient's medical and social history 2. Their use of alcohol, tobacco or illicit drugs 3. Their current medications and supplements 4. The patient's functional ability including ADL's, fall risks, home safety risks and hearing or visual             impairment. 5. Diet and physical activities 6. Evidence for depression or mood disorders  The patients weight, height, BMI have been recorded in the chart and visual acuity is per eye clinic.  I have made referrals, counseling and provided education to the patient based review of the above and I have provided the pt with a written personalized care plan for preventive services.  See scanned forms.  Routine anticipatory guidance given to patient.  See health maintenance. Flu 2014 Shingles due at 60 PNA 2015 Tetanus 2014 Colonoscopy 2012 Prostate cancer screening and PSA options (with potential risks and benefits of testing vs not testing) were discussed along with recent recs/guidelines.  He declined testing PSA at this point. Advance directive- d/w pt.  Wife designated if patient were incapacitated.  Cognitive function addressed- see scanned forms- and if abnormal then additional documentation follows.   CAD.  No CP, SOB, BLE edema.  He does have some gassiness and heartburn occ.  Weight is up.  Discussed.  Encouraged more exercise- with gradual increase.  Doesn't remember the last time he used NTG.    Gout flares- none recently.  D/w pt about diet.  meds helped.  No colchicine use recently.    Hypothyroid.  TSH wnl.  Has f/u with ENT re: throat cancer.  Voice is altered with pollen recently, o/w at baseline.  Swallowing well.    Elevated Cholesterol: Using medications without  problems:yes Muscle aches: no Diet compliance:"Not good."  Exercise:  Some   PMH and SH reviewed  Meds, vitals, and allergies reviewed.   ROS: See HPI.  Otherwise negative.    GEN: nad, alert and oriented HEENT: mucous membranes moist NECK: supple w/o LA, chronic changes noted.  CV: rrr. PULM: ctab, no inc wob ABD: soft, +bs EXT: no edema SKIN: no acute rash

## 2013-07-02 NOTE — Patient Instructions (Signed)
Take care.  Try to get back (gradually) into an exercise routine. Don't change your meds for now.  Glad to see you.

## 2013-07-03 DIAGNOSIS — Z0000 Encounter for general adult medical examination without abnormal findings: Secondary | ICD-10-CM | POA: Insufficient documentation

## 2013-07-03 NOTE — Assessment & Plan Note (Signed)
Controlled, continue as is.  No TMG on exam.  

## 2013-07-03 NOTE — Assessment & Plan Note (Signed)
See scanned forms.  Routine anticipatory guidance given to patient.  See health maintenance. Flu 2014 Shingles due at 59 PNA 2015 Tetanus 2014 Colonoscopy 2012 Prostate cancer screening and PSA options (with potential risks and benefits of testing vs not testing) were discussed along with recent recs/guidelines.  He declined testing PSA at this point. Advance directive- d/w pt.  Wife designated if patient were incapacitated.  Cognitive function addressed- see scanned forms- and if abnormal then additional documentation follows.

## 2013-07-03 NOTE — Assessment & Plan Note (Signed)
Controlled, continue as is.  

## 2013-07-03 NOTE — Assessment & Plan Note (Signed)
No recent flares, labs d/w pt.  Diet given to patient.

## 2013-07-03 NOTE — Assessment & Plan Note (Signed)
No CP, doing well.  Continue with current meds.

## 2013-08-14 ENCOUNTER — Other Ambulatory Visit: Payer: Self-pay | Admitting: Cardiovascular Disease

## 2013-08-22 ENCOUNTER — Other Ambulatory Visit: Payer: Self-pay | Admitting: Cardiovascular Disease

## 2013-11-22 ENCOUNTER — Encounter: Payer: Self-pay | Admitting: Cardiology

## 2013-11-22 ENCOUNTER — Encounter: Payer: Self-pay | Admitting: Cardiovascular Disease

## 2013-11-22 ENCOUNTER — Ambulatory Visit (INDEPENDENT_AMBULATORY_CARE_PROVIDER_SITE_OTHER): Payer: Medicare Other | Admitting: Cardiovascular Disease

## 2013-11-22 VITALS — BP 118/82 | HR 65 | Ht 68.0 in | Wt 200.0 lb

## 2013-11-22 DIAGNOSIS — I2510 Atherosclerotic heart disease of native coronary artery without angina pectoris: Secondary | ICD-10-CM

## 2013-11-22 DIAGNOSIS — Z5181 Encounter for therapeutic drug level monitoring: Secondary | ICD-10-CM

## 2013-11-22 DIAGNOSIS — I10 Essential (primary) hypertension: Secondary | ICD-10-CM

## 2013-11-22 DIAGNOSIS — I209 Angina pectoris, unspecified: Secondary | ICD-10-CM

## 2013-11-22 NOTE — Assessment & Plan Note (Signed)
Tony Hunt presents today with symptoms of angina. He states these episodes of angina are very similar to what he had prior to his stenting last year. He's been getting severe chest heaviness and pressure on the left side of his chest. This typically occurs with exertion and is relieved with rest or nitroglycerin. The symptoms have been worse over the past 2-3 months .   hese are consistent with unstable angina.  We will schedule him for cardiac catheterization. We discussed the risks, benefits, and options of cardiac catheterization.    he understands and agrees to proceed.  Remains on his aspirin and Plavix.  I encouraged him to take it easy tonight. He's to take nitroglycerin for any episodes of chest pain that don't go away very quickly.

## 2013-11-22 NOTE — Progress Notes (Signed)
Tony Hunt Date of Birth  November 10, 1955       [facility] [address], [address], [place], [place]  [address]   [place], [place]  [address] [phone]     [phone]   Fax  [phone]    Fax [phone]  Problem List: 1. CAD -  08/25/12 - 2.75 x 12 mm Promus Premier DES was deployed in the mid LAD. The stent was post-dilated with a 3.0 x 9 mm [place] balloon - Mid LAD. 2. Hypertension 3. Hypothyroidism 4. hyperlipidemia  History of Present Illness:  Tony Hunt is a 58 yo with hx of progressive CP.  These pains have progressed over the past several weeks.  Initially he had only one or 2 times per week. These pains have progressed and he now has several episodes of chest pain each day.  The pains are associated with some dyspnea and lightheadness.   They are not associated with exercise, eating, drinking, change of position. The pains seem to last about 1 minute.  He has not found anything that specifically relieves the pain. They tend to resolve spontaneously.  He had a particularly bad episode of chest discomfort early this week and went to the emergency room. His workup there was unremarkable.  He's had progressive angina. He had an episode of chest pain the office today. The there were no associated EKG changes.  August 30, 2012:  Tony Hunt was seen last week with [facility].  He had a stent placed in his mid left anterior descending artery. He was discharged the next day but started having severe headaches and night sweats. Initially he thought it may be due to the Plavix but ultimately he decided that it seemed to be more related to the Pravachol.  He's feeling much better at this time.  Sept. 23, 2014:  He had several weeks of intermittant CP ( lasted 2-3 minutes) after getting his stent.  Now , these have resolved.  He is back doing all of his normal activities without CP.  He joined the [employer] last week.  He has been 1 time.   He has  noticed some easy bleeding.    11/22/2013:  He has been having more angina CP - typically occurs at rest.  Lasts a few seconds.  Occasionally longer.  Relieved with [employer] Walking lots. 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   [facility] .    Current Outpatient Prescriptions on File Prior to Visit  Medication Sig Dispense Refill  . aspirin EC 81 MG tablet Take 1 tablet (81 mg total) by mouth daily.  90 tablet  3  . atorvastatin (LIPITOR) 20 MG tablet Take 1 tablet (20 mg total) by mouth daily.  90 tablet  3  . clopidogrel (PLAVIX) 75 MG tablet TAKE 1 TABLET BY MOUTH DAILY WITH BREAKFAST  30 tablet  3  . colchicine 0.6 MG tablet Take 2 pills for 1 day then 1 a day if needed.  30 tablet  0  . fish oil-omega-3 fatty acids 1000 MG capsule Take 1 g by mouth daily.      Marland Kitchen levothyroxine (SYNTHROID, LEVOTHROID) 100 MCG tablet Take 1 tablet (100 mcg total) by mouth daily before breakfast.  90 tablet  3  . lisinopril (PRINIVIL,ZESTRIL) 20 MG tablet TAKE 1 TABLET BY MOUTH EVERY DAY  90 tablet  3  . metoprolol tartrate (LOPRESSOR) 25 MG tablet Take 1 tablet (25 mg total) by mouth 2 (two) times daily.  180 tablet  3  . nitroGLYCERIN (NITROSTAT) 0.4 MG SL tablet Place 1 tablet (0.4 mg total) under the tongue every 5 (five) minutes as needed for chest pain.  25 tablet  3  . pantoprazole (PROTONIX) 40 MG tablet TAKE 1 TABLET EVERY DAY  90 tablet  3   No current facility-administered medications on file prior to visit.    Allergies  Allergen Reactions  . Iohexol Anaphylaxis    OK with 13 hour prep  . Pravastatin Nausea Only and Other (See Comments)    Intense headache  . Codeine Itching  . Simvastatin     Edema  . Statins     Sig edema on simvastatin    Past Medical History  Diagnosis Date  . 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 HYPERLIPIDEMIA 05/10/2007  . 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 HYPERTENSION 05/10/2007  .  ESOPHAGEAL STRICTURE 05/10/2007  . GERD 05/10/2007  . HIATAL HERNIA 05/10/2007  . SLEEP APNEA 05/10/2007  . ALCOHOL ABUSE, HX OF     distant history  . Upper airway cough syndrome     Per Dr. Melvyn Novas, pulmonary   . 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 ANXIETY DEPRESSION 05/10/2007    history of, during difficult relationship  . Anxiety     occ panic sx, increased after death of mother  . Heart disease     Past Surgical History  Procedure Laterality Date  . Back surgery  1997    lower back x3  . Wrist fracture surgery  08/21/10    Right wrist x4  . Colonoscopy    . Appendectomy    . Coronary stent placement      2.75 x 12 mm Promus Premier DES was deployed in the mid LAD. The stent was post-dilated with a 3.0 x 9 mm [place] balloon - Mid LAD.    History  Smoking status  . Former Smoker  . Quit date: 09/29/2009  Smokeless tobacco  . 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  History  Alcohol Use No    Family History  Problem Relation Age of Onset  . Brain cancer Father   . Dementia Father     h/o brain tumor  . Heart disease Mother   . Hypertension Mother   . Colon cancer Neg Hx   . Prostate cancer Neg Hx   . Heart disease Brother     Reviw of Systems:  Reviewed in the HPI.  All other systems are negative.  Physical Exam: Blood pressure 118/82, pulse 65, height 5\' 8"  (1.727 m), weight 200 lb (90.719 kg). General: Well developed, well nourished, in no acute distress.  Head: Normocephalic, atraumatic, sclera non-icteric, mucus membranes are moist,   Neck: Supple. Carotids are 2 + without bruits. No JVD   Lungs: Clear   Heart: RR, normal S1, S2  Abdomen: Soft, non-tender, non-distended with normal bowel sounds.  Msk:  Strength and tone are normal   Extremities: No clubbing or cyanosis. No edema.  Distal pedal pulses are 2+ and equal .    Pulse is 2 +   Neuro: CN II - XII intact.  Alert and oriented X 3.   Psych:  Normal  ECG:  September 24 of 2015. Normal sinus rhythm at a rate of 65. There is an incomplete right bundle branch but. There are no ST or T wave changes.  Assessment / Plan:    Tony Hunt presents today with symptoms of angina. He states these episodes of angina are very similar to what he had prior to his stenting last year. He's been getting severe chest heaviness and pressure on the left side of his chest. This typically occurs with exertion and is relieved with rest or nitroglycerin. The symptoms have been worse over the past 2-3 months .   hese are consistent with unstable angina.  We will schedule him for cardiac catheterization. We discussed the risks, benefits, and options of cardiac catheterization.    he understands and agrees to proceed.

## 2013-11-22 NOTE — Patient Instructions (Signed)
Your physician recommends that have STAT labs drawn today: cbc, bmet, pt/inr  Your physician has requested that you have a cardiac catheterization. Cardiac catheterization is used to diagnose and/or treat various heart conditions. Doctors may recommend this procedure for a number of different reasons. The most common reason is to evaluate chest pain. Chest pain can be a symptom of coronary artery disease (CAD), and cardiac catheterization can show whether plaque is narrowing or blocking your heart's arteries. This procedure is also used to evaluate the valves, as well as measure the blood flow and oxygen levels in different parts of your heart. For further information please visit [email or web address]. Please follow instruction sheet, as given.  Your physician recommends that you schedule a follow-up appointment in: 3 months with Dr. Acie Fredrickson.

## 2013-11-23 ENCOUNTER — Ambulatory Visit (HOSPITAL_COMMUNITY)
Admission: RE | Admit: 2013-11-23 | Discharge: 2013-11-23 | Disposition: A | Discharge status: Home / Self Care | Payer: Medicare Other | Source: Ambulatory Visit | Attending: Interventional Cardiology | Admitting: Interventional Cardiology

## 2013-11-23 ENCOUNTER — Encounter (HOSPITAL_COMMUNITY)
Admission: RE | Disposition: A | Discharge status: Home / Self Care | Payer: Self-pay | Source: Ambulatory Visit | Attending: Interventional Cardiology

## 2013-11-23 DIAGNOSIS — K219 Gastro-esophageal reflux disease without esophagitis: Secondary | ICD-10-CM | POA: Diagnosis not present

## 2013-11-23 DIAGNOSIS — Z87891 Personal history of nicotine dependence: Secondary | ICD-10-CM | POA: Diagnosis not present

## 2013-11-23 DIAGNOSIS — E785 Hyperlipidemia, unspecified: Secondary | ICD-10-CM | POA: Diagnosis not present

## 2013-11-23 DIAGNOSIS — I209 Angina pectoris, unspecified: Secondary | ICD-10-CM | POA: Diagnosis present

## 2013-11-23 DIAGNOSIS — Z7902 Long term (current) use of antithrombotics/antiplatelets: Secondary | ICD-10-CM | POA: Insufficient documentation

## 2013-11-23 DIAGNOSIS — Z9861 Coronary angioplasty status: Secondary | ICD-10-CM | POA: Insufficient documentation

## 2013-11-23 DIAGNOSIS — E039 Hypothyroidism, unspecified: Secondary | ICD-10-CM | POA: Diagnosis not present

## 2013-11-23 DIAGNOSIS — I25119 Atherosclerotic heart disease of native coronary artery with unspecified angina pectoris: Secondary | ICD-10-CM

## 2013-11-23 DIAGNOSIS — I2510 Atherosclerotic heart disease of native coronary artery without angina pectoris: Secondary | ICD-10-CM | POA: Diagnosis not present

## 2013-11-23 DIAGNOSIS — I10 Essential (primary) hypertension: Secondary | ICD-10-CM | POA: Diagnosis not present

## 2013-11-23 DIAGNOSIS — Z7982 Long term (current) use of aspirin: Secondary | ICD-10-CM | POA: Insufficient documentation

## 2013-11-23 HISTORY — PX: LEFT HEART CATHETERIZATION WITH CORONARY ANGIOGRAM: SHX5451

## 2013-11-23 LAB — CBC
HEMATOCRIT: 42.4 % (ref 39.0–52.0)
Hemoglobin: 14.5 g/dL (ref 13.0–17.0)
MCHC: 34.3 g/dL (ref 30.0–36.0)
MCV: 93.5 fl (ref 78.0–100.0)
Platelets: 331 10*3/uL (ref 150.0–400.0)
RBC: 4.54 Mil/uL (ref 4.22–5.81)
RDW: 13.4 % (ref 11.5–15.5)
WBC: 6.9 10*3/uL (ref 4.0–10.5)

## 2013-11-23 LAB — BASIC METABOLIC PANEL
BUN: 21 mg/dL (ref 6–23)
CALCIUM: 9.6 mg/dL (ref 8.4–10.5)
CO2: 23 mEq/L (ref 19–32)
Chloride: 105 mEq/L (ref 96–112)
Creatinine, Ser: 1.2 mg/dL (ref 0.4–1.5)
GFR: 63.32 mL/min (ref >60.00)
Glucose, Bld: 89 mg/dL (ref 70–99)
Potassium: 4.2 mEq/L (ref 3.5–5.1)
SODIUM: 135 meq/L (ref 135–145)

## 2013-11-23 LAB — PROTIME-INR
INR: 1 ratio (ref 0.8–1.0)
Prothrombin Time: 11.6 s (ref 9.6–13.1)

## 2013-11-23 SURGERY — LEFT HEART CATHETERIZATION WITH CORONARY ANGIOGRAM
Anesthesia: LOCAL

## 2013-11-23 MED ORDER — MIDAZOLAM HCL 2 MG/2ML IJ SOLN
INTRAMUSCULAR | Status: AC
  Filled 2013-11-23: qty 2

## 2013-11-23 MED ORDER — FENTANYL CITRATE 0.05 MG/ML IJ SOLN
INTRAMUSCULAR | Status: AC
  Filled 2013-11-23: qty 2

## 2013-11-23 MED ORDER — METHYLPREDNISOLONE SODIUM SUCC 125 MG IJ SOLR
INTRAMUSCULAR | Status: AC
  Administered 2013-11-23: 125 mg via INTRAVENOUS
  Filled 2013-11-23: qty 2

## 2013-11-23 MED ORDER — SODIUM CHLORIDE 0.9 % IV SOLN
250.0000 mL | INTRAVENOUS | Status: DC | PRN
Start: 2013-11-23 — End: 2013-11-23

## 2013-11-23 MED ORDER — SODIUM CHLORIDE 0.9 % IJ SOLN
3.0000 mL | Freq: Two times a day (BID) | INTRAMUSCULAR | Status: DC
Start: 2013-11-23 — End: 2013-11-23

## 2013-11-23 MED ORDER — FAMOTIDINE IN NACL 20-0.9 MG/50ML-% IV SOLN
INTRAVENOUS | Status: AC
  Administered 2013-11-23: 20 mg via INTRAVENOUS
  Filled 2013-11-23: qty 50

## 2013-11-23 MED ORDER — SODIUM CHLORIDE 0.9 % IJ SOLN: 3.0000 mL | INTRAMUSCULAR | Status: DC | PRN

## 2013-11-23 MED ORDER — ASPIRIN 81 MG PO CHEW: 81.0000 mg | CHEWABLE_TABLET | ORAL | Status: DC

## 2013-11-23 MED ORDER — DIPHENHYDRAMINE HCL 50 MG/ML IJ SOLN
25.0000 mg | INTRAMUSCULAR | Status: AC
  Administered 2013-11-23: 25 mg via INTRAVENOUS

## 2013-11-23 MED ORDER — SODIUM CHLORIDE 0.9 % IV SOLN
1.0000 mL/kg/h | INTRAVENOUS | Status: AC
  Administered 2013-11-23: 1 mL/kg/h via INTRAVENOUS

## 2013-11-23 MED ORDER — NITROGLYCERIN 1 MG/10 ML FOR IR/CATH LAB
INTRA_ARTERIAL | Status: AC
  Filled 2013-11-23: qty 10

## 2013-11-23 MED ORDER — HEPARIN SODIUM (PORCINE) 1000 UNIT/ML IJ SOLN
INTRAMUSCULAR | Status: AC
  Filled 2013-11-23: qty 1

## 2013-11-23 MED ORDER — FAMOTIDINE IN NACL 20-0.9 MG/50ML-% IV SOLN
20.0000 mg | INTRAVENOUS | Status: AC
  Administered 2013-11-23: 20 mg via INTRAVENOUS

## 2013-11-23 MED ORDER — VERAPAMIL HCL 2.5 MG/ML IV SOLN
INTRAVENOUS | Status: AC
  Filled 2013-11-23: qty 2

## 2013-11-23 MED ORDER — SODIUM CHLORIDE 0.9 % IV SOLN
INTRAVENOUS | Status: DC
  Administered 2013-11-23: 09:00:00 via INTRAVENOUS

## 2013-11-23 MED ORDER — DIPHENHYDRAMINE HCL 50 MG/ML IJ SOLN
INTRAMUSCULAR | Status: AC
  Administered 2013-11-23: 25 mg via INTRAVENOUS
  Filled 2013-11-23: qty 1

## 2013-11-23 MED ORDER — HEPARIN (PORCINE) IN NACL 2-0.9 UNIT/ML-% IJ SOLN
INTRAMUSCULAR | Status: AC
  Filled 2013-11-23: qty 1000

## 2013-11-23 MED ORDER — LIDOCAINE HCL (PF) 1 % IJ SOLN
INTRAMUSCULAR | Status: AC
  Filled 2013-11-23: qty 30

## 2013-11-23 MED ORDER — METHYLPREDNISOLONE SODIUM SUCC 125 MG IJ SOLR
125.0000 mg | INTRAMUSCULAR | Status: AC
Start: 2013-11-24 — End: 2013-11-23
  Administered 2013-11-23: 125 mg via INTRAVENOUS

## 2013-11-23 NOTE — H&P (View-Only) (Signed)
Tony Hunt Date of Birth  10-12-1955       Tony Hunt [address] [place], [place], [place]  [address]   [place], [place]  [address] [phone]     [phone]   Fax  [phone]    Fax [phone]  Problem List: 1. CAD -  08/25/12 - 2.75 x 12 mm Promus Premier DES was deployed in the mid LAD. The stent was post-dilated with a 3.0 x 9 mm [place] balloon - Mid LAD. 2. Hypertension 3. Hypothyroidism 4. hyperlipidemia  History of Present Illness:  Tony Hunt is a 58 yo with hx of progressive CP.  These pains have progressed over the past several weeks.  Initially he had only one or 2 times per week. These pains have progressed and he now has several episodes of chest pain each day.  The pains are associated with some dyspnea and lightheadness.   They are not associated with exercise, eating, drinking, change of position. The pains seem to last about 1 minute.  He has not found anything that specifically relieves the pain. They tend to resolve spontaneously.  He had a particularly bad episode of chest discomfort early this week and went to the emergency room. His workup there was unremarkable.  He's had progressive angina. He had an episode of chest pain the office today. The there were no associated EKG changes.  August 30, 2012:  Tony Hunt was seen last week with [facility].  He had a stent placed in his mid left anterior descending artery. He was discharged the next day but started having severe headaches and night sweats. Initially he thought it may be due to the Plavix but ultimately he decided that it seemed to be more related to the Pravachol.  He's feeling much better at this time.  Sept. 23, 2014:  He had several weeks of intermittant CP ( lasted 2-3 minutes) after getting his stent.  Now , these have resolved.  He is back doing all of his normal activities without CP.  He joined the [employer] last week.  He has been 1 time.   He has  noticed some easy bleeding.    11/22/2013:  He has been having more angina CP - typically occurs at rest.  Lasts a few seconds.  Occasionally longer.  Relieved with [employer] [place]. 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   [facility] .    Current Outpatient Prescriptions on File Prior to Visit  Medication Sig Dispense Refill  . aspirin EC 81 MG tablet Take 1 tablet (81 mg total) by mouth daily.  90 tablet  3  . atorvastatin (LIPITOR) 20 MG tablet Take 1 tablet (20 mg total) by mouth daily.  90 tablet  3  . clopidogrel (PLAVIX) 75 MG tablet TAKE 1 TABLET BY MOUTH DAILY WITH BREAKFAST  30 tablet  3  . colchicine 0.6 MG tablet Take 2 pills for 1 day then 1 a day if needed.  30 tablet  0  . fish oil-omega-3 fatty acids 1000 MG capsule Take 1 g by mouth daily.      Marland Kitchen levothyroxine (SYNTHROID, LEVOTHROID) 100 MCG tablet Take 1 tablet (100 mcg total) by mouth daily before breakfast.  90 tablet  3  . lisinopril (PRINIVIL,ZESTRIL) 20 MG tablet TAKE 1 TABLET BY MOUTH EVERY DAY  90 tablet  3  . metoprolol tartrate (LOPRESSOR) 25 MG tablet Take 1 tablet (25 mg total) by mouth 2 (two) times daily.  180 tablet  3  . nitroGLYCERIN (NITROSTAT) 0.4 MG SL tablet Place 1 tablet (0.4 mg total) under the tongue every 5 (five) minutes as needed for chest pain.  25 tablet  3  . pantoprazole (PROTONIX) 40 MG tablet TAKE 1 TABLET EVERY DAY  90 tablet  3   No current facility-administered medications on file prior to visit.    Allergies  Allergen Reactions  . Iohexol Anaphylaxis    OK with 13 hour prep  . Pravastatin Nausea Only and Other (See Comments)    Intense headache  . Codeine Itching  . Simvastatin     Edema  . Statins     Sig edema on simvastatin    Past Medical History  Diagnosis Date  . 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 HYPERLIPIDEMIA 05/10/2007  . 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 HYPERTENSION 05/10/2007  .  ESOPHAGEAL STRICTURE 05/10/2007  . GERD 05/10/2007  . HIATAL HERNIA 05/10/2007  . SLEEP APNEA 05/10/2007  . ALCOHOL ABUSE, HX OF     distant history  . Upper airway cough syndrome     Per Dr. Melvyn Novas, pulmonary   . 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 ANXIETY DEPRESSION 05/10/2007    history of, during difficult relationship  . Anxiety     occ panic sx, increased after death of mother  . Heart disease     Past Surgical History  Procedure Laterality Date  . Back surgery  1997    lower back x3  . Wrist fracture surgery  08/21/10    Right wrist x4  . Colonoscopy    . Appendectomy    . Coronary stent placement      2.75 x 12 mm Promus Premier DES was deployed in the mid LAD. The stent was post-dilated with a 3.0 x 9 mm [place] balloon - Mid LAD.    History  Smoking status  . Former Smoker  . Quit date: 09/29/2009  Smokeless tobacco  . 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  History  Alcohol Use No    Family History  Problem Relation Age of Onset  . Brain cancer Father   . Dementia Father     h/o brain tumor  . Heart disease Mother   . Hypertension Mother   . Colon cancer Neg Hx   . Prostate cancer Neg Hx   . Heart disease Brother     Reviw of Systems:  Reviewed in the HPI.  All other systems are negative.  Physical Exam: Blood pressure 118/82, pulse 65, height 5\' 8"  (1.727 m), weight 200 lb (90.719 kg). General: Well developed, well nourished, in no acute distress.  Head: Normocephalic, atraumatic, sclera non-icteric, mucus membranes are moist,   Neck: Supple. Carotids are 2 + without bruits. No JVD   Lungs: Clear   Heart: RR, normal S1, S2  Abdomen: Soft, non-tender, non-distended with normal bowel sounds.  Msk:  Strength and tone are normal   Extremities: No clubbing or cyanosis. No edema.  Distal pedal pulses are 2+ and equal .    Pulse is 2 +   Neuro: CN II - XII intact.  Alert and oriented X 3.   Psych:  Normal  ECG:  September 24 of 2015. Normal sinus rhythm at a rate of 65. There is an incomplete right bundle branch but. There are no ST or T wave changes.  Assessment / Plan:    Tafari presents today with symptoms of angina. He states these episodes of angina are very similar to what he had prior to his stenting last year. He's been getting severe chest heaviness and pressure on the left side of his chest. This typically occurs with exertion and is relieved with rest or nitroglycerin. The symptoms have been worse over the past 2-3 months .   hese are consistent with unstable angina.  We will schedule him for cardiac catheterization. We discussed the risks, benefits, and options of cardiac catheterization.    he understands and agrees to proceed.

## 2013-11-23 NOTE — CV Procedure (Signed)
       PROCEDURE:  Left heart catheterization with selective coronary angiography.  INDICATIONS:  Angina  The risks, benefits, and details of the procedure were explained to the patient.  The patient verbalized understanding and wanted to proceed.  Informed written consent was obtained.  PROCEDURE TECHNIQUE:  After Xylocaine anesthesia a 74F slender sheath was placed in the right radial artery with a single anterior needle wall stick.   IV heparin was given. Right coronary angiography was done using a Judkins R4 guide catheter.  Left coronary angiography was done using a Judkins L3.5 guide catheter.  Left ventriculography was done using a pigtail catheter.  A TR band was used for hemostasis.   CONTRAST:  Total of 40 cc.  COMPLICATIONS:  None.    HEMODYNAMICS:  Aortic pressure was 101/62; LV pressure was 103/8; LVEDP 17.  There was no gradient between the left ventricle and aorta.    ANGIOGRAPHIC DATA:   The left main coronary artery is widely patent.  The left anterior descending artery is a large vessel proximally. The vessel becomes quite small before the apex. There is a large first diagonal which has mild disease. In the proximal to mid LAD, there is mild, diffuse disease.  The left circumflex artery is a large vessel. There are 2 medium-size obtuse marginals which are patent. The third obtuse marginal is large and has mild disease. There is a large atrial branch which is widely patent. There is no obstructive disease in the circumflex system.  The right coronary artery is a large dominant vessel. There is mild, proximal disease. The posterior lateral artery is large with mild disease. The posterior descending artery is large with mild disease. There is no significant structural disease in the RCA system.  LEFT VENTRICULOGRAM:  Left ventricular angiogram was not done to minimize contrast exposure given his history of anaphylaxis with contrast.  LVEDP was 17  mmHg.  IMPRESSIONS:  1. Normal left main coronary artery. 2. Widely patent stent in the mid left anterior descending artery. 3. Widely patent left circumflex artery and its branches. 4. Mild disease in the proximal right coronary artery.  This has progressed mildly from the 2014 pictures 5. Left ventricular systolic function not assessed to avoid additional contrast exposure due to history of contrast allergy.  LVEDP 17 mmHg.  RECOMMENDATION:  Continue aggressive secondary prevention.  Discharge later today.  Followup with Dr. Acie Fredrickson.

## 2013-11-23 NOTE — Discharge Instructions (Signed)
Radial Site Care °Refer to this sheet in the next few weeks. These instructions provide you with information on caring for yourself after your procedure. Your caregiver may also give you more specific instructions. Your treatment has been planned according to current medical practices, but problems sometimes occur. Call your caregiver if you have any problems or questions after your procedure. °HOME CARE INSTRUCTIONS °· You may shower the day after the procedure. Remove the bandage (dressing) and gently wash the site with plain soap and water. Gently pat the site dry. °· Do not apply powder or lotion to the site. °· Do not submerge the affected site in water for 3 to 5 days. °· Inspect the site at least twice daily. °· Do not flex or bend the affected arm for 24 hours. °· No lifting over 5 pounds (2.3 kg) for 5 days after your procedure. °· Do not drive home if you are discharged the same day of the procedure. Have someone else drive you. °· You may drive 24 hours after the procedure unless otherwise instructed by your caregiver. °· Do not operate machinery or power tools for 24 hours. °· A responsible adult should be with you for the first 24 hours after you arrive home. °What to expect: °· Any bruising will usually fade within 1 to 2 weeks. °· Blood that collects in the tissue (hematoma) may be painful to the touch. It should usually decrease in size and tenderness within 1 to 2 weeks. °SEEK IMMEDIATE MEDICAL CARE IF: °· You have unusual pain at the radial site. °· You have redness, warmth, swelling, or pain at the radial site. °· You have drainage (other than a small amount of blood on the dressing). °· You have chills. °· You have a fever or persistent symptoms for more than 72 hours. °· You have a fever and your symptoms suddenly get worse. °· Your arm becomes pale, cool, tingly, or numb. °· You have heavy bleeding from the site. Hold pressure on the site. °Document Released: 03/20/2010 Document Revised:  05/10/2011 Document Reviewed: 03/20/2010 °ExitCare® Patient Information ©2015 [employer]. This information is not intended to replace advice given to you by your health care provider. Make sure you discuss any questions you have with your health care provider. ° °

## 2013-11-23 NOTE — Interval H&P Note (Signed)
Cath Lab Visit (complete for each Cath Lab visit)  Clinical Evaluation Leading to the Procedure:   ACS: No.  Non-ACS:    Anginal Classification: CCS III  Anti-ischemic medical therapy: Minimal Therapy (1 class of medications)  Non-Invasive Test Results: No non-invasive testing performed  Prior CABG: No previous CABG      History and Physical Interval Note:  11/23/2013 11:31 AM  Tony Hunt  has presented today for surgery, with the diagnosis of angina  The various methods of treatment have been discussed with the patient and family. After consideration of risks, benefits and other options for treatment, the patient has consented to  Procedure(s): LEFT HEART CATHETERIZATION WITH CORONARY ANGIOGRAM (N/A) as a surgical intervention .  The patient's history has been reviewed, patient examined, no change in status, stable for surgery.  I have reviewed the patient's chart and labs.  Questions were answered to the patient's satisfaction.     Alfreda Hammad S.

## 2013-11-27 ENCOUNTER — Telehealth: Payer: Self-pay

## 2013-11-27 NOTE — Telephone Encounter (Signed)
Pt left v/m; since pt is under age of 56 needs rx for pneumonia shot sent to [facility]. Pt request cb when sent. Spoke with pt and on 07/02/13 out immunization record indicates pt got pneumonia vaccine. Pt does not remember if he got a shot on 07/02/13; pt wants to know if he needs pneumonia vaccine or tetanus shot. Immunization record has pt getting tdap on 03/06/2012. Pt said has already gotten flu shot [facility] weeks ago at [facility]. Please advise does pt need any immunizations at this time.

## 2013-11-28 NOTE — Telephone Encounter (Signed)
Patient notified as instructed by telephone. EMR updated with flu vaccine.

## 2013-11-28 NOTE — Telephone Encounter (Signed)
If he had the flu shot, then he should be up to date.  Next item is shingles shot at 59 and another flu shot next fall.  Please update EMR re: recent flu shot.  Thanks.

## 2013-12-10 ENCOUNTER — Encounter: Payer: Self-pay | Admitting: Pulmonary Disease

## 2013-12-12 ENCOUNTER — Other Ambulatory Visit: Payer: Self-pay | Admitting: Cardiovascular Disease

## 2013-12-18 ENCOUNTER — Other Ambulatory Visit: Payer: Self-pay | Admitting: Cardiovascular Disease

## 2013-12-21 ENCOUNTER — Ambulatory Visit (INDEPENDENT_AMBULATORY_CARE_PROVIDER_SITE_OTHER): Payer: Medicare Other | Admitting: Family Medicine

## 2013-12-21 ENCOUNTER — Encounter: Payer: Self-pay | Admitting: Family Medicine

## 2013-12-21 VITALS — BP 112/64 | HR 64 | Temp 97.7°F | Wt 201.5 lb

## 2013-12-21 DIAGNOSIS — R1030 Lower abdominal pain, unspecified: Secondary | ICD-10-CM

## 2013-12-21 LAB — COMPREHENSIVE METABOLIC PANEL
ALT: 13 U/L (ref 0–53)
AST: 13 U/L (ref 0–37)
Albumin: 3.5 g/dL (ref 3.5–5.2)
Alkaline Phosphatase: 59 U/L (ref 39–117)
BILIRUBIN TOTAL: 0.4 mg/dL (ref 0.2–1.2)
BUN: 26 mg/dL — ABNORMAL HIGH (ref 6–23)
CO2: 27 mEq/L (ref 19–32)
Calcium: 9.8 mg/dL (ref 8.4–10.5)
Chloride: 106 mEq/L (ref 96–112)
Creatinine, Ser: 1.4 mg/dL (ref 0.4–1.5)
GFR: 55.49 mL/min — ABNORMAL LOW (ref >60.00)
Glucose, Bld: 83 mg/dL (ref 70–99)
Potassium: 4.7 mEq/L (ref 3.5–5.1)
SODIUM: 136 meq/L (ref 135–145)
TOTAL PROTEIN: 7.3 g/dL (ref 6.0–8.3)

## 2013-12-21 LAB — CBC WITH DIFFERENTIAL/PLATELET
BASOS PCT: 0.5 % (ref 0.0–3.0)
Basophils Absolute: 0 10*3/uL (ref 0.0–0.1)
Eosinophils Absolute: 0.1 10*3/uL (ref 0.0–0.7)
Eosinophils Relative: 2.6 % (ref 0.0–5.0)
HEMATOCRIT: 44 % (ref 39.0–52.0)
HEMOGLOBIN: 14.5 g/dL (ref 13.0–17.0)
LYMPHS ABS: 1.4 10*3/uL (ref 0.7–4.0)
Lymphocytes Relative: 25.5 % (ref 12.0–46.0)
MCHC: 32.9 g/dL (ref 30.0–36.0)
MCV: 94.7 fl (ref 78.0–100.0)
MONO ABS: 0.5 10*3/uL (ref 0.1–1.0)
Monocytes Relative: 8.8 % (ref 3.0–12.0)
NEUTROS PCT: 62.6 % (ref 43.0–77.0)
Neutro Abs: 3.3 10*3/uL (ref 1.4–7.7)
Platelets: 291 10*3/uL (ref 150.0–400.0)
RBC: 4.65 Mil/uL (ref 4.22–5.81)
RDW: 14.1 % (ref 11.5–15.5)
WBC: 5.3 10*3/uL (ref 4.0–10.5)

## 2013-12-21 LAB — LIPASE: Lipase: 28 U/L (ref 11.0–59.0)

## 2013-12-21 MED ORDER — CIPROFLOXACIN HCL 500 MG PO TABS: 500.0000 mg | ORAL_TABLET | Freq: Two times a day (BID) | ORAL | Status: DC

## 2013-12-21 MED ORDER — METRONIDAZOLE 500 MG PO TABS: 500.0000 mg | ORAL_TABLET | Freq: Three times a day (TID) | ORAL | Status: DC

## 2013-12-21 NOTE — Progress Notes (Signed)
Pre visit review using our clinic review tool, if applicable. No additional management support is needed unless otherwise documented below in the visit note.  Had some intermittent abd pain over the last month.  Recently with BRBPR, last 2-3 days, but none today.  Had a normal BM today.  Recently with some diarrhea over the last week, after eating, later in the day.  Would have a normal BM o/w in the AM.  No FCV.  No trigger known for his sx.  Possible hemorrhoids.  Prep H helped with bleeding and pain.  Lower abd still tender. H/o appendectomy noted.  H/o colonoscopy and known internal hemorrhoids and diverticulosis.  No dysuria.    Meds, vitals, and allergies reviewed.   ROS: See HPI.  Otherwise, noncontributory.  GEN: nad, alert and oriented HEENT: mucous membranes moist NECK: supple w/o LA CV: rrr.  PULM: ctab, no inc wob ABD: soft, +bs, minimally ttp across the B lower abd w/o rebound. EXT: no edema SKIN: no acute rash

## 2013-12-21 NOTE — Patient Instructions (Signed)
Likely diverticulitis.  Go to the lab on the way out.  We'll contact you with your lab report. Clear liquid diet in the meantime. Start both antibiotics today.  Don't drink alcohol in the meantime.  If severe pain or a lot of bleeding- to ER.  Update me Monday.  Take care.

## 2013-12-23 DIAGNOSIS — R1030 Lower abdominal pain, unspecified: Principal | ICD-10-CM | POA: Insufficient documentation

## 2013-12-23 DIAGNOSIS — R1032 Left lower quadrant pain: Secondary | ICD-10-CM | POA: Insufficient documentation

## 2013-12-23 NOTE — Assessment & Plan Note (Signed)
Lower abd pain with blood in stool and no dysuria.  Likely relatively mild diverticulitis.  Check labs today.   Clear liquid diet in the meantime.  Start cipro flagyl today. No alcohol in the meantime.  If severe pain or a lot of bleeding- to ER.  He'll update me Monday.  Okay for outpatient f/u.  ddx d/w pt.  Given his exam and known diverticulosis, would not have to get CT at this point.  He agrees.

## 2014-01-28 ENCOUNTER — Telehealth: Payer: Self-pay | Admitting: Family Medicine

## 2014-01-28 NOTE — Telephone Encounter (Signed)
Patient Information:  Caller Name: Rami  Phone: [phone]  Patient: Tony Hunt  Gender: Male  DOB: 1956/02/23  Age: 58 Years  PCP: Elsie Stain Brigitte Pulse) [facility])  Office Follow Up:  Does the office need to follow up with this patient?: No  Instructions For The Office: N/A  RN Note:  Per disp contacted the office and spoke with Mearl Latin, referred pt to [facility] for eval.  Symptoms  Reason For Call & Symptoms: Pt reports abdominal pain.  Pt reports he was given Cipro and Flagyl in 12/21/13.  Pt states medication help[ed but pain came back 01/27/14.  Pt would like a refill on medications.  Pt reports pain in just below the waist and has had 1 episode of blood in the stool.  Reviewed Health History In EMR: Yes  Reviewed Medications In EMR: Yes  Reviewed Allergies In EMR: Yes  Reviewed Surgeries / Procedures: Yes  Date of Onset of Symptoms: 01/27/2014  Guideline(s) Used:  Abdominal Pain - Male  Disposition Per Guideline:   Go to ED Now (or to Office with PCP Approval)  Reason For Disposition Reached:   Constant abdominal pain lasting > 2 hours  Advice Given:  N/A  Patient Will Follow Care Advice:  YES

## 2014-01-28 NOTE — Telephone Encounter (Signed)
Tony Hunt with [facility] said pt had taken Cipro and Flagyl in October 2015 and stomach pain got better; on 01/27/14 stomach pain restarted and pt saw red blood in stool. Pt has hx of throat CA; [facility] disposition is ED and there are no available appts at [facility] today, Tony Hunt will let pt know to go to ED for eval now.

## 2014-01-29 NOTE — Telephone Encounter (Signed)
Noted. To PCP

## 2014-01-29 NOTE — Telephone Encounter (Signed)
I agree with ER.  If this is a relatively quick recurrence, since the last episode, the ER is reasonable. Thanks.

## 2014-02-07 ENCOUNTER — Encounter (HOSPITAL_COMMUNITY): Payer: Self-pay | Admitting: Cardiovascular Disease

## 2014-02-13 ENCOUNTER — Encounter: Payer: Self-pay | Admitting: Family Medicine

## 2014-02-13 ENCOUNTER — Ambulatory Visit (INDEPENDENT_AMBULATORY_CARE_PROVIDER_SITE_OTHER): Payer: Medicare Other | Admitting: Family Medicine

## 2014-02-13 VITALS — BP 118/70 | HR 64 | Temp 98.6°F | Wt 207.2 lb

## 2014-02-13 DIAGNOSIS — R1030 Lower abdominal pain, unspecified: Secondary | ICD-10-CM

## 2014-02-13 MED ORDER — CIPROFLOXACIN HCL 500 MG PO TABS: 500.0000 mg | ORAL_TABLET | Freq: Two times a day (BID) | ORAL | Status: DC

## 2014-02-13 MED ORDER — METRONIDAZOLE 500 MG PO TABS: 500.0000 mg | ORAL_TABLET | Freq: Three times a day (TID) | ORAL | Status: DC

## 2014-02-13 NOTE — Progress Notes (Signed)
Pre visit review using our clinic review tool, if applicable. No additional management support is needed unless otherwise documented below in the visit note.  Prev with presumed diverticulitis in 10/15 that resolved with abx, got better quickly.  Last OV note with me reviewed, re: abd pain and abx.  Had a period of time with no troubles, then had return of sx.   Some days are better than others.  Now with pain for the last few days, not escalating.   Feels bloated. Dec in appetite.  No blood in stool.   No vomiting.  No nausea.  Diffuse lower abd pain.   No back pain.  No dysuria.  Still with normal urine stream.  No travel, no atypical foods.  He's been careful to avoid atypical foods.  BMs can be normal early AM, but often has diarrhea during the middle of the day after a meal. No blood in stool.   Meds, vitals, and allergies reviewed.   ROS: See HPI.  Otherwise, noncontributory.  nad ncat Mmm rrr ctab abd with B lower abd ttp w/o rebound.  Soft, normal BS Ext w/o edema

## 2014-02-13 NOTE — Patient Instructions (Signed)
Start back on the antibiotics (cipro and flagyl). Tony Hunt will call about your referral. Clear liquid diet in the meantime.  We'll be in touch when I get the CT report.  If profound increase in pain, then go to the ER.  Take care.  Update me in a few days, sooner if needed.  Glad to see you.

## 2014-02-14 ENCOUNTER — Ambulatory Visit (INDEPENDENT_AMBULATORY_CARE_PROVIDER_SITE_OTHER)
Admission: RE | Admit: 2014-02-14 | Discharge: 2014-02-14 | Disposition: A | Discharge status: Home / Self Care | Payer: Medicare Other | Source: Ambulatory Visit | Attending: Family Medicine | Admitting: Family Medicine

## 2014-02-14 ENCOUNTER — Other Ambulatory Visit: Payer: Self-pay | Admitting: Family Medicine

## 2014-02-14 DIAGNOSIS — I714 Abdominal aortic aneurysm, without rupture, unspecified: Secondary | ICD-10-CM

## 2014-02-14 DIAGNOSIS — I77819 Aortic ectasia, unspecified site: Secondary | ICD-10-CM

## 2014-02-14 DIAGNOSIS — R1030 Lower abdominal pain, unspecified: Secondary | ICD-10-CM

## 2014-02-14 HISTORY — DX: Abdominal aortic aneurysm, without rupture, unspecified: I71.40

## 2014-02-14 HISTORY — DX: Abdominal aortic aneurysm, without rupture: I71.4

## 2014-02-14 HISTORY — DX: Aortic ectasia, unspecified site: I77.819

## 2014-02-14 NOTE — Assessment & Plan Note (Signed)
Concern for smoldering diverticulitis.  Restart abx.  If worse, to ER.  He agrees. D/w pt about anatomy and path/phys.  Prev with sigmoid diverticulosis noted on scope.  Check CT, no IV contrast due to history, will use oral contrast.  Order verbally changed with radiology to CT with oral contrast only.  Patient understands the plan. >25 minutes spent in face to face time with patient, >50% spent in counselling or coordination of care.

## 2014-02-15 ENCOUNTER — Encounter: Payer: Self-pay | Admitting: Cardiovascular Disease

## 2014-02-15 ENCOUNTER — Ambulatory Visit (INDEPENDENT_AMBULATORY_CARE_PROVIDER_SITE_OTHER): Payer: Medicare Other | Admitting: Cardiovascular Disease

## 2014-02-15 VITALS — BP 120/82 | HR 54 | Ht 68.0 in | Wt 205.0 lb

## 2014-02-15 DIAGNOSIS — I2510 Atherosclerotic heart disease of native coronary artery without angina pectoris: Secondary | ICD-10-CM

## 2014-02-15 LAB — BASIC METABOLIC PANEL
BUN: 19 mg/dL (ref 6–23)
CALCIUM: 10 mg/dL (ref 8.4–10.5)
CO2: 23 mEq/L (ref 19–32)
CREATININE: 1.3 mg/dL (ref 0.4–1.5)
Chloride: 105 mEq/L (ref 96–112)
GFR: 62.12 mL/min (ref >60.00)
GLUCOSE: 88 mg/dL (ref 70–99)
Potassium: 4.7 mEq/L (ref 3.5–5.1)
Sodium: 136 mEq/L (ref 135–145)

## 2014-02-15 LAB — LIPID PANEL
CHOLESTEROL: 203 mg/dL — AB (ref 0–200)
HDL: 26.3 mg/dL — ABNORMAL LOW (ref >39.00)
NonHDL: 176.7
Total CHOL/HDL Ratio: 8
Triglycerides: 390 mg/dL — ABNORMAL HIGH (ref 0.0–149.0)
VLDL: 78 mg/dL — AB (ref 0.0–40.0)

## 2014-02-15 LAB — LDL CHOLESTEROL, DIRECT: LDL DIRECT: 99.6 mg/dL

## 2014-02-15 NOTE — Patient Instructions (Signed)
Your physician recommends that you return for lab work today--Lipid profile/BMET.  Your physician wants you to follow-up in: 6 months with Dr Acie Fredrickson. (June 2016). You will receive a reminder letter in the mail two months in advance. If you don't receive a letter, please call our office to schedule the follow-up appointment.   Your physician recommends that you return for a FASTING lipid profile /BMET in 6 months.

## 2014-02-15 NOTE — Assessment & Plan Note (Signed)
He is doing well. His cath in sept. Showed no significant lesions.  Will check lipids today. Ov in 6 months.

## 2014-02-15 NOTE — Progress Notes (Signed)
Tony Hunt Date of Birth  1955/08/31       [facility] [address], [address], [place], [place]  [address]   [place], [place]  [address] [phone]     [phone]   Fax  [phone]    Fax [phone]  Problem List: 1. CAD -  08/25/12 - 2.75 x 12 mm Promus Premier DES was deployed in the mid LAD. The stent was post-dilated with a 3.0 x 9 mm [place] balloon - Mid LAD. 2. Hypertension 3. Hypothyroidism 4. hyperlipidemia  History of Present Illness:  Tony Hunt is a 58 yo with hx of progressive CP.  These pains have progressed over the past several weeks.  Initially he had only one or 2 times per week. These pains have progressed and he now has several episodes of chest pain each day.  The pains are associated with some dyspnea and lightheadness.   They are not associated with exercise, eating, drinking, change of position. The pains seem to last about 1 minute.  He has not found anything that specifically relieves the pain. They tend to resolve spontaneously.  He had a particularly bad episode of chest discomfort early this week and went to the emergency room. His workup there was unremarkable.  He's had progressive angina. He had an episode of chest pain the office today. The there were no associated EKG changes.  August 30, 2012:  Tony Hunt was seen last week with [facility].  He had a stent placed in his mid left anterior descending artery. He was discharged the next day but started having severe headaches and night sweats. Initially he thought it may be due to the Plavix but ultimately he decided that it seemed to be more related to the Pravachol.  He's feeling much better at this time.  Sept. 23, 2014:  He had several weeks of intermittant CP ( lasted 2-3 minutes) after getting his stent.  Now , these have resolved.  He is back doing all of his normal activities without CP.  He joined the [employer] last week.  He has been 1 time.   He has  noticed some easy bleeding.    11/22/2013:  He has been having more angina CP - typically occurs at rest.  Lasts a few seconds.  Occasionally longer.  Relieved with [employer] [place]. 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   [facility] .   Dec. 18, 2015: And 1 was having some chest pain when I last done in September. Cardiac admission did not reveal any significant stenosis. History of PCI.  Has been doing better. Not exercising as much.  No trouble deer hunting this past fall.    Current Outpatient Prescriptions on File Prior to Visit  Medication Sig Dispense Refill  . aspirin EC 81 MG tablet Take 1 tablet (81 mg total) by mouth daily. 90 tablet 3  . atorvastatin (LIPITOR) 20 MG tablet Take 1 tablet (20 mg total) by mouth daily. 90 tablet 3  . ciprofloxacin (CIPRO) 500 MG tablet Take 1 tablet (500 mg total) by mouth 2 (two) times daily. 20 tablet 0  . clopidogrel (PLAVIX) 75 MG tablet Take 75 mg by mouth daily.    . fish oil-omega-3 fatty acids 1000 MG capsule Take 1 g by mouth daily.    Marland Kitchen levothyroxine (SYNTHROID, LEVOTHROID) 100 MCG  tablet Take 1 tablet (100 mcg total) by mouth daily before breakfast. 90 tablet 3  . lisinopril (PRINIVIL,ZESTRIL) 20 MG tablet Take 20 mg by mouth daily.    . metoprolol tartrate (LOPRESSOR) 25 MG tablet Take 1 tablet (25 mg total) by mouth 2 (two) times daily. 180 tablet 3  . metroNIDAZOLE (FLAGYL) 500 MG tablet Take 1 tablet (500 mg total) by mouth 3 (three) times daily. 30 tablet 0  . nitroGLYCERIN (NITROSTAT) 0.4 MG SL tablet Place 1 tablet (0.4 mg total) under the tongue every 5 (five) minutes as needed for chest pain. 25 tablet 3  . pantoprazole (PROTONIX) 40 MG tablet Take 40 mg by mouth daily.     No current facility-administered medications on file prior to visit.    Allergies  Allergen Reactions  . Iohexol Anaphylaxis and Other (See Comments)    OK with 13 hour prep  . Pravastatin Nausea  Only and Other (See Comments)    Intense headache  . Codeine Itching  . Simvastatin Other (See Comments)    Edema  . Statins Other (See Comments)    Sig edema on simvastatin    Past Medical History  Diagnosis Date  . 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 HYPERLIPIDEMIA 05/10/2007  . 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 HYPERTENSION 05/10/2007  . ESOPHAGEAL STRICTURE 05/10/2007  . GERD 05/10/2007  . HIATAL HERNIA 05/10/2007  . SLEEP APNEA 05/10/2007  . ALCOHOL ABUSE, HX OF     distant history  . Upper airway cough syndrome     Per Dr. Melvyn Novas, pulmonary   . 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 ANXIETY DEPRESSION 05/10/2007    history of, during difficult relationship  . Anxiety     occ panic sx, increased after death of mother  . Heart disease     Past Surgical History  Procedure Laterality Date  . Back surgery  1997    lower back x3  . Wrist fracture surgery  08/21/10    Right wrist x4  . Colonoscopy    . Appendectomy    . Coronary stent placement      2.75 x 12 mm Promus Premier DES was deployed in the mid LAD. The stent was post-dilated with a 3.0 x 9 mm [place] balloon - Mid LAD.  Marland Kitchen Left heart catheterization with coronary angiogram N/A 08/25/2012    Procedure: LEFT HEART CATHETERIZATION WITH CORONARY ANGIOGRAM;  Surgeon: Burnell Blanks, MD;  Location[facility] C[facility] LAB;  Service: Cardiovascular;  Laterality: N/A;  . Left heart catheterization with coronary angiogram N/A 11/23/2013    Procedure: LEFT HEART CATHETERIZATION WITH CORONARY ANGIOGRAM;  Surgeon: Jettie Booze, MD;  Location: [facility] CATH LAB;  Service: Cardiovascular;  Laterality: N/A;    History  Smoking status  . Former Smoker  . Quit date: 09/29/2009  Smokeless tobacco  . 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  History  Alcohol Use No    Family History  Problem  Relation Age of Onset  . Brain cancer Father   . Dementia Father     h/o brain tumor  . Heart disease Mother   . Hypertension Mother   . Colon cancer Neg Hx   . Prostate cancer Neg Hx   . Heart disease Brother     Reviw of Systems:  Reviewed in the  HPI.  All other systems are negative.  Physical Exam: Blood pressure 120/82, pulse 54, height 5\' 8"  (1.727 m), weight 205 lb (92.987 kg), SpO2 96 %. General: Well developed, well nourished, in no acute distress.  Head: Normocephalic, atraumatic, sclera non-icteric, mucus membranes are moist,   Neck: Supple. Carotids are 2 + without bruits. No JVD   Lungs: Clear   Heart: RR, normal S1, S2  Abdomen: Soft, non-tender, non-distended with normal bowel sounds.  Msk:  Strength and tone are normal   Extremities: No clubbing or cyanosis. No edema.  Distal pedal pulses are 2+ and equal .   Pulse is 2 +   Neuro: CN II - XII intact.  Alert and oriented X 3.   Psych:  Normal   ECG:  September 24 of 2015. Normal sinus rhythm at a rate of 65. There is an incomplete right bundle branch but. There are no ST or T wave changes.  Assessment / Plan:

## 2014-02-18 ENCOUNTER — Other Ambulatory Visit: Payer: Self-pay

## 2014-02-18 DIAGNOSIS — E785 Hyperlipidemia, unspecified: Secondary | ICD-10-CM

## 2014-02-18 MED ORDER — FENOFIBRATE 160 MG PO TABS: 160.0000 mg | ORAL_TABLET | Freq: Every day | ORAL | Status: DC

## 2014-03-15 ENCOUNTER — Encounter: Payer: Self-pay | Admitting: Surgery

## 2014-03-18 ENCOUNTER — Encounter: Payer: Self-pay | Admitting: Surgery

## 2014-03-18 ENCOUNTER — Ambulatory Visit (INDEPENDENT_AMBULATORY_CARE_PROVIDER_SITE_OTHER): Payer: Medicare Other | Admitting: Surgery

## 2014-03-18 VITALS — BP 110/72 | HR 63 | Ht 68.0 in | Wt 204.0 lb

## 2014-03-18 DIAGNOSIS — I714 Abdominal aortic aneurysm, without rupture, unspecified: Secondary | ICD-10-CM

## 2014-03-18 NOTE — Progress Notes (Signed)
Patient name: Tony Hunt MRN: [id number] DOB: 1955-12-06 Sex: male   Referred by: Dr. Damita Dunnings  Reason for referral:  Chief Complaint  Patient presents with  . New Evaluation    aneurysmal dilation of distal aorta    HISTORY OF PRESENT ILLNESS: This is a very pleasant 59 year old gentleman who is referred today for evaluation of an abdominal aortic aneurysm.  The patient initially presented with lower abdominal pain and cramping with diarrhea as well as blood in his stool with a history of diverticulosis.  This led to a CT scan which detected a 3 cm distal aortic aneurysm.  Previously in 2005 it measured 1.6 cm.  Bilateral common iliac arteries measured 1.9.  The patient denies any current abdominal pain.  The patient has a history of laryngeal cancer, status post radiation treatment.  He quit smoking 4 years ago at the time of this treatment.  He has a history of hypercholesterolemia which is managed with a statin.  His blood pressure has been well controlled  Past Medical History  Diagnosis Date  . 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 HYPERLIPIDEMIA 05/10/2007  . 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 HYPERTENSION 05/10/2007  . ESOPHAGEAL STRICTURE 05/10/2007  . GERD 05/10/2007  . HIATAL HERNIA 05/10/2007  . SLEEP APNEA 05/10/2007  . ALCOHOL ABUSE, HX OF     distant history  . Upper airway cough syndrome     Per Dr. Melvyn Novas, pulmonary   . 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 ANXIETY DEPRESSION 05/10/2007    history of, during difficult relationship  . Anxiety     occ panic sx, increased after death of mother  . Heart disease     Past Surgical History  Procedure Laterality Date  . Back surgery  1997    lower back x3  . Wrist fracture surgery  08/21/10    Right wrist x4  . Colonoscopy    . Appendectomy    . Coronary stent placement      2.75 x 12 mm Promus Premier DES was deployed in the mid  LAD. The stent was post-dilated with a 3.0 x 9 mm [place] balloon - Mid LAD.  Marland Kitchen Left heart catheterization with coronary angiogram N/A 08/25/2012    Procedure: LEFT HEART CATHETERIZATION WITH CORONARY ANGIOGRAM;  Surgeon: Burnell Blanks, MD;  Location: [facility] CATH LAB;  Service: Cardiovascular;  Laterality: N/A;  . Left heart catheterization with coronary angiogram N/A 11/23/2013    Procedure: LEFT HEART CATHETERIZATION WITH CORONARY ANGIOGRAM;  Surgeon: Jettie Booze, MD;  Location: [facility] CATH LAB;  Service: Cardiovascular;  Laterality: N/A;    History   Social History  . Marital Status: Married    Spouse Name: N/A    Number of Children: N/A  . Years of Education: N/A   Occupational History  .       Retired   Social History Main Topics  . Smoking status: Former Smoker    Quit date: 09/30/2010  . Smokeless tobacco: Former Systems developer  . Alcohol Use: 2.4 oz/week    4 Cans of beer per week  . Drug Use: No  . Sexual Activity: Not on file   Other Topics Concern  . Not on file   Social History Narrative   Married 8/13. Has a grown daughter.   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     Family History  Problem Relation Age of  Onset  . Brain cancer Father   . Dementia Father     h/o brain tumor  . Heart disease Mother   . Hypertension Mother   . Colon cancer Neg Hx   . Prostate cancer Neg Hx   . Heart disease Brother   . Hypertension Brother   . Hyperlipidemia Brother     Allergies as of 03/18/2014 - Review Complete 03/18/2014  Allergen Reaction Noted  . Iohexol Anaphylaxis and Other (See Comments) 09/22/2010  . Pravastatin Nausea Only and Other (See Comments) 08/29/2012  . Codeine Itching 05/04/2007  . Simvastatin Other (See Comments) 08/02/2011  . Statins Other (See Comments) 08/02/2011    Current Outpatient Prescriptions on File Prior to Visit  Medication Sig Dispense Refill  . aspirin EC 81 MG tablet Take 1 tablet (81 mg total) by mouth daily. 90 tablet 3  . atorvastatin (LIPITOR)  20 MG tablet Take 1 tablet (20 mg total) by mouth daily. 90 tablet 3  . clopidogrel (PLAVIX) 75 MG tablet Take 75 mg by mouth daily.    . fenofibrate 160 MG tablet Take 1 tablet (160 mg total) by mouth daily. 30 tablet 6  . fish oil-omega-3 fatty acids 1000 MG capsule Take 1 g by mouth daily.    Marland Kitchen levothyroxine (SYNTHROID, LEVOTHROID) 100 MCG tablet Take 1 tablet (100 mcg total) by mouth daily before breakfast. 90 tablet 3  . lisinopril (PRINIVIL,ZESTRIL) 20 MG tablet Take 20 mg by mouth daily.    . metoprolol tartrate (LOPRESSOR) 25 MG tablet Take 1 tablet (25 mg total) by mouth 2 (two) times daily. 180 tablet 3  . nitroGLYCERIN (NITROSTAT) 0.4 MG SL tablet Place 1 tablet (0.4 mg total) under the tongue every 5 (five) minutes as needed for chest pain. 25 tablet 3  . pantoprazole (PROTONIX) 40 MG tablet Take 40 mg by mouth daily.    Marland Kitchen amoxicillin (AMOXIL) 250 MG capsule Take 250 mg by mouth 3 (three) times daily. For 10 days  1  . ciprofloxacin (CIPRO) 500 MG tablet Take 1 tablet (500 mg total) by mouth 2 (two) times daily. (Patient not taking: Reported on 03/18/2014) 20 tablet 0  . metroNIDAZOLE (FLAGYL) 500 MG tablet Take 1 tablet (500 mg total) by mouth 3 (three) times daily. (Patient not taking: Reported on 03/18/2014) 30 tablet 0   No current facility-administered medications on file prior to visit.     REVIEW OF SYSTEMS: Cardiovascular: No chest pain, chest pressure, palpitations, orthopnea, or dyspnea on exertion. No history of DVT or phlebitis. Pulmonary: No productive cough, asthma or wheezing. Neurologic: No weakness, paresthesias, aphasia, or amaurosis. No dizziness. Hematologic: No bleeding problems or clotting disorders. Musculoskeletal: No joint pain or joint swelling. Gastrointestinal: No blood in stool or hematemesis Genitourinary: No dysuria or hematuria. Psychiatric:: No history of major depression. Integumentary: No rashes or ulcers. Constitutional: No fever or  chills.  PHYSICAL EXAMINATION: General: The patient appears their stated age.  Vital signs are BP 110/72 mmHg  Pulse 63  Ht 5\' 8"  (1.727 m)  Wt 204 lb (92.534 kg)  BMI 31.03 kg/m2  SpO2 98% HEENT:  No gross abnormalities Pulmonary: Respirations are non-labored Abdomen: Soft and non-tender  Musculoskeletal: There are no major deformities.   Neurologic: No focal weakness or paresthesias are detected, Skin: There are no ulcer or rashes noted. Psychiatric: The patient has normal affect. Cardiovascular: There is a regular rate and rhythm without significant murmur appreciated.  Palpable pedal pulses.  No carotid bruits.  Diagnostic Studies: I  have reviewed his CT scan which shows a distal abdominal aortic aneurysm measuring 3 cm with ectasia of bilateral common iliac arteries   Assessment:  Abdominal aortic aneurysm Plan: I discussed the natural history of aneurysmal degeneration of the abdominal aorta.  I discussed that at his size, his risk for rupture is very low.  I told him that this will be best managed with yearly surveillance ultrasound, which I will have done at my office.  I would consider repair for a straightforward aneurysm at 5 cm.     Eldridge Abrahams, M.D. [facility] Office: [phone] Pager:  [phone]

## 2014-03-18 NOTE — Addendum Note (Signed)
Addended by: Mena Goes on: 03/18/2014 03:20 PM   Modules accepted: Orders

## 2014-03-27 DIAGNOSIS — S66092S Other specified injury of long flexor muscle, fascia and tendon of left thumb at wrist and hand level, sequela: Secondary | ICD-10-CM | POA: Diagnosis not present

## 2014-03-28 ENCOUNTER — Encounter (HOSPITAL_BASED_OUTPATIENT_CLINIC_OR_DEPARTMENT_OTHER): Payer: Self-pay | Admitting: *Deleted

## 2014-03-28 ENCOUNTER — Other Ambulatory Visit: Payer: Self-pay | Admitting: Orthopedic Surgery

## 2014-03-29 ENCOUNTER — Encounter (HOSPITAL_BASED_OUTPATIENT_CLINIC_OR_DEPARTMENT_OTHER): Payer: Self-pay | Admitting: *Deleted

## 2014-03-29 NOTE — Progress Notes (Signed)
Tony Hunt at Dr Carolann Littler is getting ok to stop plavix-and cardiac clearance Pt to come for bmet

## 2014-04-01 ENCOUNTER — Telehealth: Payer: Self-pay | Admitting: Cardiovascular Disease

## 2014-04-01 ENCOUNTER — Encounter (HOSPITAL_BASED_OUTPATIENT_CLINIC_OR_DEPARTMENT_OTHER)
Admission: RE | Admit: 2014-04-01 | Discharge: 2014-04-01 | Disposition: A | Discharge status: Home / Self Care | Payer: Medicare Other | Source: Ambulatory Visit | Attending: Orthopedic Surgery | Admitting: Orthopedic Surgery

## 2014-04-01 DIAGNOSIS — Y929 Unspecified place or not applicable: Secondary | ICD-10-CM | POA: Diagnosis not present

## 2014-04-01 DIAGNOSIS — N189 Chronic kidney disease, unspecified: Secondary | ICD-10-CM | POA: Diagnosis not present

## 2014-04-01 DIAGNOSIS — S66012A Strain of long flexor muscle, fascia and tendon of left thumb at wrist and hand level, initial encounter: Secondary | ICD-10-CM | POA: Diagnosis not present

## 2014-04-01 DIAGNOSIS — M79645 Pain in left finger(s): Secondary | ICD-10-CM | POA: Diagnosis present

## 2014-04-01 DIAGNOSIS — Y939 Activity, unspecified: Secondary | ICD-10-CM | POA: Diagnosis not present

## 2014-04-01 DIAGNOSIS — K219 Gastro-esophageal reflux disease without esophagitis: Secondary | ICD-10-CM | POA: Diagnosis not present

## 2014-04-01 DIAGNOSIS — F1010 Alcohol abuse, uncomplicated: Secondary | ICD-10-CM | POA: Diagnosis not present

## 2014-04-01 DIAGNOSIS — E785 Hyperlipidemia, unspecified: Secondary | ICD-10-CM | POA: Diagnosis not present

## 2014-04-01 DIAGNOSIS — I739 Peripheral vascular disease, unspecified: Secondary | ICD-10-CM | POA: Diagnosis not present

## 2014-04-01 DIAGNOSIS — E039 Hypothyroidism, unspecified: Secondary | ICD-10-CM | POA: Diagnosis not present

## 2014-04-01 DIAGNOSIS — G4730 Sleep apnea, unspecified: Secondary | ICD-10-CM | POA: Diagnosis not present

## 2014-04-01 DIAGNOSIS — Z87891 Personal history of nicotine dependence: Secondary | ICD-10-CM | POA: Diagnosis not present

## 2014-04-01 DIAGNOSIS — Y999 Unspecified external cause status: Secondary | ICD-10-CM | POA: Diagnosis not present

## 2014-04-01 DIAGNOSIS — Z8521 Personal history of malignant neoplasm of larynx: Secondary | ICD-10-CM | POA: Diagnosis not present

## 2014-04-01 DIAGNOSIS — I129 Hypertensive chronic kidney disease with stage 1 through stage 4 chronic kidney disease, or unspecified chronic kidney disease: Secondary | ICD-10-CM | POA: Diagnosis not present

## 2014-04-01 DIAGNOSIS — F329 Major depressive disorder, single episode, unspecified: Secondary | ICD-10-CM | POA: Diagnosis not present

## 2014-04-01 DIAGNOSIS — I25119 Atherosclerotic heart disease of native coronary artery with unspecified angina pectoris: Secondary | ICD-10-CM | POA: Diagnosis not present

## 2014-04-01 DIAGNOSIS — X58XXXA Exposure to other specified factors, initial encounter: Secondary | ICD-10-CM | POA: Diagnosis not present

## 2014-04-01 LAB — BASIC METABOLIC PANEL
ANION GAP: 6 (ref 5–15)
BUN: 19 mg/dL (ref 6–23)
CHLORIDE: 107 mmol/L (ref 96–112)
CO2: 27 mmol/L (ref 19–32)
CREATININE: 1.53 mg/dL — AB (ref 0.50–1.35)
Calcium: 9.2 mg/dL (ref 8.4–10.5)
GFR calc Af Amer: 56 mL/min — ABNORMAL LOW (ref >90)
GFR calc non Af Amer: 48 mL/min — ABNORMAL LOW (ref >90)
GLUCOSE: 105 mg/dL — AB (ref 70–99)
Potassium: 5 mmol/L (ref 3.5–5.1)
SODIUM: 140 mmol/L (ref 135–145)

## 2014-04-01 NOTE — Telephone Encounter (Signed)
Left message for Sandi Raveling, surgical coordinator at [facility] that I am faxing Dr. Elmarie Shiley note of cardiac clearance and Plavix hold.  Fax sent and confirmation received.

## 2014-04-01 NOTE — Telephone Encounter (Signed)
Tony Hunt is at low risk for CV complication. He may hold Plavix for 7 days prior to procedure

## 2014-04-01 NOTE — Telephone Encounter (Signed)
New Msg     Request for surgical clearance:  1. What type of surgery is being performed? Thumb fusion   2. When is this surgery scheduled? 04/02/14   3. Are there any medications that need to be held prior to surgery and how long? Does pt need to hold Plavix? He hasn't taken it today.    4. Name of physician performing surgery? Dr. Milly Jakob   5. What is your office phone and fax number? Juliann Pulse direct [phone], Office fax # [phone].    Juliann Pulse from [facility] calling, pt scheduled to have surgery tomorrow.

## 2014-04-02 ENCOUNTER — Encounter (HOSPITAL_BASED_OUTPATIENT_CLINIC_OR_DEPARTMENT_OTHER): Payer: Self-pay | Admitting: *Deleted

## 2014-04-02 ENCOUNTER — Encounter (HOSPITAL_BASED_OUTPATIENT_CLINIC_OR_DEPARTMENT_OTHER)
Admission: RE | Disposition: A | Discharge status: Home / Self Care | Payer: Self-pay | Source: Ambulatory Visit | Attending: Orthopedic Surgery

## 2014-04-02 ENCOUNTER — Ambulatory Visit (HOSPITAL_BASED_OUTPATIENT_CLINIC_OR_DEPARTMENT_OTHER): Payer: Medicare Other | Admitting: Anesthesiology

## 2014-04-02 ENCOUNTER — Ambulatory Visit (HOSPITAL_COMMUNITY): Payer: Medicare Other

## 2014-04-02 ENCOUNTER — Ambulatory Visit (HOSPITAL_BASED_OUTPATIENT_CLINIC_OR_DEPARTMENT_OTHER)
Admission: RE | Admit: 2014-04-02 | Discharge: 2014-04-02 | Disposition: A | Discharge status: Home / Self Care | Payer: Medicare Other | Source: Ambulatory Visit | Attending: Orthopedic Surgery | Admitting: Orthopedic Surgery

## 2014-04-02 DIAGNOSIS — K219 Gastro-esophageal reflux disease without esophagitis: Secondary | ICD-10-CM | POA: Diagnosis not present

## 2014-04-02 DIAGNOSIS — I129 Hypertensive chronic kidney disease with stage 1 through stage 4 chronic kidney disease, or unspecified chronic kidney disease: Secondary | ICD-10-CM | POA: Insufficient documentation

## 2014-04-02 DIAGNOSIS — Y999 Unspecified external cause status: Secondary | ICD-10-CM | POA: Insufficient documentation

## 2014-04-02 DIAGNOSIS — F1010 Alcohol abuse, uncomplicated: Secondary | ICD-10-CM | POA: Diagnosis not present

## 2014-04-02 DIAGNOSIS — I10 Essential (primary) hypertension: Secondary | ICD-10-CM | POA: Diagnosis not present

## 2014-04-02 DIAGNOSIS — I739 Peripheral vascular disease, unspecified: Secondary | ICD-10-CM | POA: Insufficient documentation

## 2014-04-02 DIAGNOSIS — S66092S Other specified injury of long flexor muscle, fascia and tendon of left thumb at wrist and hand level, sequela: Secondary | ICD-10-CM | POA: Diagnosis not present

## 2014-04-02 DIAGNOSIS — I25119 Atherosclerotic heart disease of native coronary artery with unspecified angina pectoris: Secondary | ICD-10-CM | POA: Insufficient documentation

## 2014-04-02 DIAGNOSIS — E785 Hyperlipidemia, unspecified: Secondary | ICD-10-CM | POA: Diagnosis not present

## 2014-04-02 DIAGNOSIS — Z87891 Personal history of nicotine dependence: Secondary | ICD-10-CM | POA: Diagnosis not present

## 2014-04-02 DIAGNOSIS — G4730 Sleep apnea, unspecified: Secondary | ICD-10-CM | POA: Insufficient documentation

## 2014-04-02 DIAGNOSIS — E039 Hypothyroidism, unspecified: Secondary | ICD-10-CM | POA: Insufficient documentation

## 2014-04-02 DIAGNOSIS — Z8521 Personal history of malignant neoplasm of larynx: Secondary | ICD-10-CM | POA: Diagnosis not present

## 2014-04-02 DIAGNOSIS — Y939 Activity, unspecified: Secondary | ICD-10-CM | POA: Insufficient documentation

## 2014-04-02 DIAGNOSIS — G4733 Obstructive sleep apnea (adult) (pediatric): Secondary | ICD-10-CM | POA: Diagnosis not present

## 2014-04-02 DIAGNOSIS — F17201 Nicotine dependence, unspecified, in remission: Secondary | ICD-10-CM | POA: Diagnosis not present

## 2014-04-02 DIAGNOSIS — F329 Major depressive disorder, single episode, unspecified: Secondary | ICD-10-CM | POA: Diagnosis not present

## 2014-04-02 DIAGNOSIS — Z981 Arthrodesis status: Secondary | ICD-10-CM | POA: Diagnosis not present

## 2014-04-02 DIAGNOSIS — S66012A Strain of long flexor muscle, fascia and tendon of left thumb at wrist and hand level, initial encounter: Secondary | ICD-10-CM | POA: Diagnosis not present

## 2014-04-02 DIAGNOSIS — M2460 Ankylosis, unspecified joint: Secondary | ICD-10-CM

## 2014-04-02 DIAGNOSIS — N189 Chronic kidney disease, unspecified: Secondary | ICD-10-CM | POA: Diagnosis not present

## 2014-04-02 DIAGNOSIS — Y929 Unspecified place or not applicable: Secondary | ICD-10-CM | POA: Insufficient documentation

## 2014-04-02 DIAGNOSIS — S56022A Laceration of flexor muscle, fascia and tendon of left thumb at forearm level, initial encounter: Secondary | ICD-10-CM | POA: Diagnosis not present

## 2014-04-02 DIAGNOSIS — X58XXXA Exposure to other specified factors, initial encounter: Secondary | ICD-10-CM | POA: Insufficient documentation

## 2014-04-02 HISTORY — PX: CARPOMETACARPAL (CMC) FUSION OF THUMB: SHX6290

## 2014-04-02 LAB — POCT HEMOGLOBIN-HEMACUE: HEMOGLOBIN: 15.7 g/dL (ref 13.0–17.0)

## 2014-04-02 SURGERY — CARPOMETACARPAL (CMC) FUSION OF THUMB
Anesthesia: General | Site: Thumb | Laterality: Left

## 2014-04-02 MED ORDER — LACTATED RINGERS IV SOLN
INTRAVENOUS | Status: DC
  Administered 2014-04-02: 14:00:00 via INTRAVENOUS

## 2014-04-02 MED ORDER — FENTANYL CITRATE 0.05 MG/ML IJ SOLN
INTRAMUSCULAR | Status: DC | PRN
Start: 2014-04-02 — End: 2014-04-02
  Administered 2014-04-02 (×2): 50 ug via INTRAVENOUS

## 2014-04-02 MED ORDER — MIDAZOLAM HCL 2 MG/2ML IJ SOLN
INTRAMUSCULAR | Status: AC
  Filled 2014-04-02: qty 2

## 2014-04-02 MED ORDER — BUPIVACAINE-EPINEPHRINE 0.5% -1:200000 IJ SOLN
INTRAMUSCULAR | Status: DC | PRN
  Administered 2014-04-02: 6.5 mL

## 2014-04-02 MED ORDER — CEFAZOLIN SODIUM-DEXTROSE 2-3 GM-% IV SOLR
INTRAVENOUS | Status: AC
  Filled 2014-04-02: qty 50

## 2014-04-02 MED ORDER — ONDANSETRON HCL 4 MG/2ML IJ SOLN
INTRAMUSCULAR | Status: DC | PRN
  Administered 2014-04-02: 4 mg via INTRAVENOUS

## 2014-04-02 MED ORDER — DEXAMETHASONE SODIUM PHOSPHATE 4 MG/ML IJ SOLN
INTRAMUSCULAR | Status: DC | PRN
  Administered 2014-04-02: 10 mg via INTRAVENOUS

## 2014-04-02 MED ORDER — MIDAZOLAM HCL 2 MG/2ML IJ SOLN: 1.0000 mg | INTRAMUSCULAR | Status: DC | PRN

## 2014-04-02 MED ORDER — PROPOFOL 10 MG/ML IV BOLUS
INTRAVENOUS | Status: AC
  Filled 2014-04-02: qty 20

## 2014-04-02 MED ORDER — CEFAZOLIN SODIUM-DEXTROSE 2-3 GM-% IV SOLR
2.0000 g | INTRAVENOUS | Status: AC
  Administered 2014-04-02: 2 g via INTRAVENOUS

## 2014-04-02 MED ORDER — GLYCOPYRROLATE 0.2 MG/ML IJ SOLN
INTRAMUSCULAR | Status: DC | PRN
  Administered 2014-04-02: 0.2 mg via INTRAVENOUS

## 2014-04-02 MED ORDER — OXYCODONE-ACETAMINOPHEN 5-325 MG PO TABS: 1.0000 | ORAL_TABLET | ORAL | Status: DC | PRN

## 2014-04-02 MED ORDER — LACTATED RINGERS IV SOLN
INTRAVENOUS | Status: DC
  Administered 2014-04-02 (×2): via INTRAVENOUS

## 2014-04-02 MED ORDER — LIDOCAINE HCL (CARDIAC) 20 MG/ML IV SOLN
INTRAVENOUS | Status: DC | PRN
  Administered 2014-04-02: 100 mg via INTRAVENOUS

## 2014-04-02 MED ORDER — MIDAZOLAM HCL 5 MG/5ML IJ SOLN
INTRAMUSCULAR | Status: DC | PRN
  Administered 2014-04-02: 2 mg via INTRAVENOUS

## 2014-04-02 MED ORDER — FENTANYL CITRATE 0.05 MG/ML IJ SOLN
INTRAMUSCULAR | Status: AC
Start: 2014-04-02 — End: 2014-04-02
  Filled 2014-04-02: qty 4

## 2014-04-02 MED ORDER — FENTANYL CITRATE 0.05 MG/ML IJ SOLN: 50.0000 ug | INTRAMUSCULAR | Status: DC | PRN

## 2014-04-02 MED ORDER — PROPOFOL 10 MG/ML IV BOLUS
INTRAVENOUS | Status: DC | PRN
  Administered 2014-04-02: 200 mg via INTRAVENOUS

## 2014-04-02 MED ORDER — FENTANYL CITRATE 0.05 MG/ML IJ SOLN
INTRAMUSCULAR | Status: AC
  Filled 2014-04-02: qty 2

## 2014-04-02 MED ORDER — EPHEDRINE SULFATE 50 MG/ML IJ SOLN
INTRAMUSCULAR | Status: DC | PRN
  Administered 2014-04-02: 10 mg via INTRAVENOUS

## 2014-04-02 SURGICAL SUPPLY — 61 items
BIT DRILL 11/64XX180123XX4 (BIT)
BIT DRILL 11/64XX180123XX4.3 (BIT) IMPLANT
BIT DRILL MINI LNG ACUTRAK 2 (BIT) ×2 IMPLANT
BLADE AVERAGE 25MMX9MM (BLADE)
BLADE AVERAGE 25X9 (BLADE) IMPLANT
BLADE MINI RND TIP GREEN BEAV (BLADE) IMPLANT
BLADE SURG 15 STRL LF DISP TIS (BLADE) ×2 IMPLANT
BLADE SURG 15 STRL SS (BLADE) ×4
BNDG CMPR 9X4 STRL LF SNTH (GAUZE/BANDAGES/DRESSINGS) ×2
BNDG COHESIVE 4X5 TAN STRL (GAUZE/BANDAGES/DRESSINGS) ×4 IMPLANT
BNDG ESMARK 4X9 LF (GAUZE/BANDAGES/DRESSINGS) ×4 IMPLANT
BNDG GAUZE ELAST 4 BULKY (GAUZE/BANDAGES/DRESSINGS) ×8 IMPLANT
CHLORAPREP W/TINT 26ML (MISCELLANEOUS) ×4 IMPLANT
CORDS BIPOLAR (ELECTRODE) ×4 IMPLANT
COVER BACK TABLE 60X90IN (DRAPES) ×4 IMPLANT
CUFF TOURNIQUET SINGLE 18IN (TOURNIQUET CUFF) ×4 IMPLANT
DECANTER SPIKE VIAL GLASS SM (MISCELLANEOUS) IMPLANT
DRAPE C-ARM 42X72 X-RAY (DRAPES) ×4 IMPLANT
DRAPE EXTREMITY T 121X128X90 (DRAPE) ×4 IMPLANT
DRAPE SURG 17X23 STRL (DRAPES) ×4 IMPLANT
DRILL BIT 1/8DIAX5INL DISPOSE (BIT) IMPLANT
DRILL BIT 11/64XX180123XX4.3 (BIT)
DRILL MINI LNG ACUTRAK 2 (BIT) ×4
DRSG EMULSION OIL 3X3 NADH (GAUZE/BANDAGES/DRESSINGS) ×4 IMPLANT
GAUZE SPONGE 4X4 12PLY STRL (GAUZE/BANDAGES/DRESSINGS) ×4 IMPLANT
GLOVE BIO SURGEON STRL SZ7.5 (GLOVE) ×4 IMPLANT
GLOVE BIOGEL PI IND STRL 7.0 (GLOVE) IMPLANT
GLOVE BIOGEL PI IND STRL 8 (GLOVE) ×2 IMPLANT
GLOVE BIOGEL PI INDICATOR 7.0 (GLOVE)
GLOVE BIOGEL PI INDICATOR 8 (GLOVE) ×2
GLOVE ECLIPSE 6.5 STRL STRAW (GLOVE) IMPLANT
GLOVE EXAM NITRILE EXT CUFF MD (GLOVE) ×4 IMPLANT
GLOVE SURG SS PI 7.0 STRL IVOR (GLOVE) ×4 IMPLANT
GOWN STRL REUS W/ TWL LRG LVL3 (GOWN DISPOSABLE) ×4 IMPLANT
GOWN STRL REUS W/TWL LRG LVL3 (GOWN DISPOSABLE) ×8
GOWN STRL REUS W/TWL XL LVL3 (GOWN DISPOSABLE) ×4 IMPLANT
GUIDEWIRE ORTHO MINI ACTK .045 (WIRE) ×4 IMPLANT
K-WIRE .062X4 (WIRE) ×4 IMPLANT
LOOP VESSEL MAXI BLUE (MISCELLANEOUS) IMPLANT
LOOP VESSEL MINI RED (MISCELLANEOUS) IMPLANT
NEEDLE HYPO 25X1 1.5 SAFETY (NEEDLE) ×4 IMPLANT
NS IRRIG 1000ML POUR BTL (IV SOLUTION) ×4 IMPLANT
PACK BASIN DAY SURGERY FS (CUSTOM PROCEDURE TRAY) ×4 IMPLANT
PADDING CAST ABS 4INX4YD NS (CAST SUPPLIES) ×2
PADDING CAST ABS COTTON 4X4 ST (CAST SUPPLIES) ×2 IMPLANT
SCREW ACUTRAK 2 MINI 30MM (Screw) ×4 IMPLANT
SHEET MEDIUM DRAPE 40X70 STRL (DRAPES) ×4 IMPLANT
SLEEVE SCD COMPRESS KNEE MED (MISCELLANEOUS) ×4 IMPLANT
SPLINT PLASTER CAST XFAST 3X15 (CAST SUPPLIES) ×20 IMPLANT
SPLINT PLASTER XTRA FASTSET 3X (CAST SUPPLIES) ×20
STOCKINETTE 6  STRL (DRAPES) ×2
STOCKINETTE 6 STRL (DRAPES) ×2 IMPLANT
SUT ETHIBOND 2-0 V-5 NEEDLE (SUTURE) IMPLANT
SUT ETHIBOND 3-0 V-5 (SUTURE) IMPLANT
SUT STEEL 4 (SUTURE) IMPLANT
SUT VICRYL RAPIDE 4-0 (SUTURE) IMPLANT
SUT VICRYL RAPIDE 4/0 PS 2 (SUTURE) ×4 IMPLANT
SYR BULB 3OZ (MISCELLANEOUS) ×4 IMPLANT
SYRINGE 10CC LL (SYRINGE) ×4 IMPLANT
TOWEL OR 17X24 6PK STRL BLUE (TOWEL DISPOSABLE) ×4 IMPLANT
UNDERPAD 30X30 INCONTINENT (UNDERPADS AND DIAPERS) ×4 IMPLANT

## 2014-04-02 NOTE — Op Note (Signed)
04/02/2014  2:11 PM  PATIENT:  Tony Hunt  59 y.o. male  PRE-OPERATIVE DIAGNOSIS:  Left thumb chronic FPL rupture  POST-OPERATIVE DIAGNOSIS:  Same  PROCEDURE:  Left thumb interphalangeal joint arthrodesis  SURGEON: Rayvon Char. Grandville Silos, MD  PHYSICIAN ASSISTANT: None  ANESTHESIA:  general  SPECIMENS:  None  DRAINS:   None  EBL:  less than 50 mL  PREOPERATIVE INDICATIONS:  Tony Hunt is a  59 y.o. male with chronic left thumb FPL deficiency, now with increasing pain and hyperextensibility of the IP joint of the thumb.  The risks benefits and alternatives were discussed with the patient preoperatively including but not limited to the risks of infection, bleeding, nerve injury, cardiopulmonary complications, the need for revision surgery, among others, and the patient verbalized understanding and consented to proceed.  OPERATIVE IMPLANTS: Mini Acutrak screw, size 30  OPERATIVE PROCEDURE:  After receiving prophylactic antibiotics, the patient was escorted to the operative theatre and placed in a supine position.  General anesthesia was administered. A surgical "time-out" was performed during which the planned procedure, proposed operative site, and the correct patient identity were compared to the operative consent and agreement confirmed by the circulating nurse according to current facility policy.  Following application of a tourniquet to the operative extremity, the exposed skin was prepped with Chloraprep and draped in the usual sterile fashion.  The limb was exsanguinated with an Esmarch bandage and the tourniquet inflated to approximately 146mmHg higher than systolic BP.  An H-shaped incision was made dorsally over the thumb IP joint to expose it. The extensor tendon was divided. The joint was shotgunned in the collateral ligaments taken down. The joint surfaces were prepared with a combination of curettes and Rogers for fusion. A guidepin from the mini Acutrak set was then driven  through the distal phalanx from proximal to distal exiting the volar distal surface of the pulp. This was done with fluoroscopic guidance. The fusion site was then copiously irrigated, and the K wire was then driven back across the fusion site into the proximal phalanx obliquely, resulting in a slightly flexed posture to the thumb. The cannulated drill was then used to create a path for the screw and a 30 length screw was selected. Finally prepared autograft was then packed into the fusion site and the screw was placed. It was advanced deeply enough that it was contained within the distal phalanx. The fusion site was solid and well approximated on both direct visualization and fluoroscopic assessment. Vital images were obtained and the wound was again irrigated. Extensor tendon was repaired with Vicryl suture and the tourniquet was released. Additional hemostasis was obtained with bipolar electrocautery Naprosyn Marcaine with epinephrine was instilled the base of the digit for postoperative pain control and additional hemostasis. Skin was reapproximated with 4-0 Vicryl Rapide interrupted sutures. A bulky splint dressing with a thumb spica plaster component was applied and he was awakened and taken to the recovery room stable condition, breathing spontaneously.  DISPOSITION: He'll be discharged home today with typical instructions, returning in 10-15 days. At that time he should have x-rays of the left thumb out of the splint and likely application of a short arm thumb spica cast.

## 2014-04-02 NOTE — Discharge Instructions (Addendum)
Discharge Instructions   You have a dressing with a plaster splint incorporated in it. Move your fingers as much as possible, making a full fist and fully opening the fist. Elevate your hand to reduce pain & swelling of the digits.  Ice over the operative site may be helpful to reduce pain & swelling.  DO NOT USE HEAT. Pain medicine has been prescribed for you.  Use your medicine as needed over the first 48 hours, and then you can begin to taper your use.  You may use Tylenol in place of your prescribed pain medication, but not IN ADDITION to it. Leave the dressing in place until you return to our office.  You may shower, but keep the bandage clean & dry.  You may drive a car when you are off of prescription pain medications and can safely control your vehicle with both hands.   Please call [phone] during normal business hours or [phone] after hours for any problems. Including the following:  - excessive redness of the incisions - drainage for more than 4 days - fever of more than 101.5 F  *Please note that pain medications will not be refilled after hours or on weekends.   Post Anesthesia Home Care Instructions  Activity: Get plenty of rest for the remainder of the day. A responsible adult should stay with you for 24 hours following the procedure.  For the next 24 hours, DO NOT: -Drive a car -Paediatric nurse -Drink alcoholic beverages -Take any medication unless instructed by your physician -Make any legal decisions or sign important papers.  Meals: Start with liquid foods such as gelatin or soup. Progress to regular foods as tolerated. Avoid greasy, spicy, heavy foods. If nausea and/or vomiting occur, drink only clear liquids until the nausea and/or vomiting subsides. Call your physician if vomiting continues.  Special Instructions/Symptoms: Your throat may feel dry or sore from the anesthesia or the breathing tube placed in your throat during surgery. If this  causes discomfort, gargle with warm salt water. The discomfort should disappear within 24 hours.

## 2014-04-02 NOTE — H&P (Signed)
Tony Hunt is an 59 y.o. male.   CC / Reason for Visit: Left thumb pain HPI: This patient is a 59 year old RHD male who is reportedly retired and presents for evaluation of his left thumb.  He reports that in the summer of 2014, he lacerated his left thumb fairly deeply at the transverse flexion crease of the IP joint.  Since that time he has been unable to actively flex the IP joint, but he is having progressively more pain develop at the volar aspect of the tip, particularly with pinching activities, et Ronney Asters.  He wishes to see if there is any treatment that could improve his situation.  Past Medical History  Diagnosis Date  . 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 HYPERLIPIDEMIA 05/10/2007  . 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 HYPERTENSION 05/10/2007  . ESOPHAGEAL STRICTURE 05/10/2007  . GERD 05/10/2007  . HIATAL HERNIA 05/10/2007  . ALCOHOL ABUSE, HX OF     distant history  . Upper airway cough syndrome     Per Dr. Melvyn Novas, pulmonary   . 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 ANXIETY DEPRESSION 05/10/2007    history of, during difficult relationship  . Anxiety     occ panic sx, increased after death of mother  . Heart disease   . SLEEP APNEA 05/10/2007    lost 100lb-not now    Past Surgical History  Procedure Laterality Date  . Back surgery  1997    lower back x3  . Wrist fracture surgery  08/21/10    Right wrist x4  . Colonoscopy    . Appendectomy    . Coronary stent placement      2.75 x 12 mm Promus Premier DES was deployed in the mid LAD. The stent was post-dilated with a 3.0 x 9 mm  balloon - Mid LAD.  Marland Kitchen Left heart catheterization with coronary angiogram N/A 08/25/2012    Procedure: LEFT HEART CATHETERIZATION WITH CORONARY ANGIOGRAM;  Surgeon: Burnell Blanks, MD;  Location: [facility] CATH LAB;  Service: Cardiovascular;  Laterality: N/A;  . Left heart catheterization with coronary angiogram N/A  11/23/2013    Procedure: LEFT HEART CATHETERIZATION WITH CORONARY ANGIOGRAM;  Surgeon: Jettie Booze, MD;  Location: [facility] CATH LAB;  Service: Cardiovascular;  Laterality: N/A;  . Laryngoscopy / bronchoscopy / esophagoscopy  2012    bx    Family History  Problem Relation Age of Onset  . Brain cancer Father   . Dementia Father     h/o brain tumor  . Heart disease Mother   . Hypertension Mother   . Colon cancer Neg Hx   . Prostate cancer Neg Hx   . Heart disease Brother   . Hypertension Brother   . Hyperlipidemia Brother    Social History:  reports that he quit smoking about 3 years ago. He has quit using smokeless tobacco. He reports that he drinks about 2.4 oz of alcohol per week. He reports that he does not use illicit drugs.  Allergies:  Allergies  Allergen Reactions  . Iohexol Anaphylaxis and Other (See Comments)    OK with 13 hour prep  . Pravastatin Nausea Only and Other (See Comments)    Intense headache  . Codeine Itching  . Simvastatin Other (See Comments)    Edema  . Statins Other (See Comments)    Sig edema on simvastatin    No prescriptions prior to admission  Results for orders placed or performed during the hospital encounter of 04/02/14 (from the past 48 hour(s))  Basic metabolic panel     Status: Abnormal   Collection Time: 04/01/14  9:13 AM  Result Value Ref Range   Sodium 140 135 - 145 mmol/L   Potassium 5.0 3.5 - 5.1 mmol/L   Chloride 107 96 - 112 mmol/L   CO2 27 19 - 32 mmol/L   Glucose, Bld 105 (H) 70 - 99 mg/dL   BUN 19 6 - 23 mg/dL   Creatinine, Ser 1.53 (H) 0.50 - 1.35 mg/dL   Calcium 9.2 8.4 - 10.5 mg/dL   GFR calc non Af Amer 48 (L) >90 mL/min   GFR calc Af Amer 56 (L) >90 mL/min    Comment: (NOTE) The eGFR has been calculated using the CKD EPI equation. This calculation has not been validated in all clinical situations. eGFR's persistently <90 mL/min signify possible Chronic Kidney Disease.    Anion gap 6 5 - 15   No results  found.  Review of Systems  All other systems reviewed and are negative.   Height _0  (1.727 m), weight 92.534 kg (204 lb). Physical Exam  Constitutional:  WD, WN, NAD HEENT:  NCAT, EOMI Neuro/Psych:  Alert & oriented to person, place, and time; appropriate mood & affect Lymphatic: No generalized UE edema or lymphadenopathy Extremities / MSK:  Both UE are normal with respect to appearance, ranges of motion, joint stability, muscle strength/tone, sensation, & perfusion except as otherwise noted:  The left thumb has a healed laceration transversely across the IP joint.  He has intact but subjectively altered sensibility at the tip.  He can detect a 3.6 1 monofilament at the tip both radially and ulnarly, as well as the tips of the index and long fingers adjacent to it.  On the right side he can flex the IP joint to 50, left side he can flex to 30 passively.  The IP joint hyperextends on the right side 55, left 65.  No active FPL function  Labs / Xrays:  No radiographic studies obtained today.  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  I indicated that the tendon transfers associated with the potential for greater complications, but ultimately is a option that could restore function better and preserve IP joint motion.  Alternatively, the IP joint fusion his also a good selection, with a lesser risk for complications but with the downside of having no future motion at the IP joint.  He has decided to proceed with fusion, which will be scheduled at a timing of his choosing.  Nashira Mcglynn A. 04/02/2014, 8:37 AM

## 2014-04-02 NOTE — Anesthesia Preprocedure Evaluation (Signed)
Anesthesia Evaluation  Patient identified by MRN, date of birth, ID band Patient awake    Reviewed: Allergy & Precautions, NPO status   History of Anesthesia Complications Negative for: history of anesthetic complications  Airway Mallampati: II  TM Distance: >3 FB Neck ROM: Full    Dental  (+) Teeth Intact, Dental Advisory Given   Pulmonary sleep apnea , former smoker,    Pulmonary exam normal       Cardiovascular hypertension, + angina + CAD and + Peripheral Vascular Disease     Neuro/Psych PSYCHIATRIC DISORDERS Anxiety Depression negative neurological ROS     GI/Hepatic Neg liver ROS, GERD-  Controlled,  Endo/Other  Hypothyroidism   Renal/GU Renal InsufficiencyRenal disease     Musculoskeletal   Abdominal   Peds  Hematology   Anesthesia Other Findings   Reproductive/Obstetrics                             Anesthesia Physical Anesthesia Plan  ASA: III  Anesthesia Plan: General   Post-op Pain Management:    Induction: Intravenous  Airway Management Planned: LMA  Additional Equipment:   Intra-op Plan:   Post-operative Plan: Extubation in OR  Informed Consent: I have reviewed the patients History and Physical, chart, labs and discussed the procedure including the risks, benefits and alternatives for the proposed anesthesia with the patient or authorized representative who has indicated his/her understanding and acceptance.   Dental advisory given  Plan Discussed with: CRNA, Anesthesiologist and Surgeon  Anesthesia Plan Comments:         Anesthesia Quick Evaluation

## 2014-04-02 NOTE — Transfer of Care (Cosign Needed)
Immediate Anesthesia Transfer of Care Note  Patient: Tony Hunt  Procedure(s) Performed: Procedure(s): CARPOMETACARPAL ([place]) FUSION OF THUMB/LEFT THUMB INTERPHALNGEAL JOINT FUSION (Left)  Patient Location: PACU  Anesthesia Type:General  Level of Consciousness: awake, sedated and patient cooperative  Airway & Oxygen Therapy: Patient Spontanous Breathing and Patient connected to face mask oxygen  Post-op Assessment: Report given to RN and Post -op Vital signs reviewed and stable  Post vital signs: Reviewed and stable  Last Vitals:  Filed Vitals:   04/02/14 1324  BP: 121/76  Pulse: 54  Temp: 36.8 C  Resp: 16    Complications: No apparent anesthesia complications

## 2014-04-02 NOTE — Anesthesia Postprocedure Evaluation (Signed)
Anesthesia Post Note  Patient: Tony Hunt  Procedure(s) Performed: Procedure(s) (LRB): CARPOMETACARPAL ([place]) FUSION OF THUMB/LEFT THUMB INTERPHALNGEAL JOINT FUSION (Left)  Anesthesia type: general  Patient location: PACU  Post pain: Pain level controlled  Post assessment: Patient's Cardiovascular Status Stable  Last Vitals:  Filed Vitals:   04/02/14 1612  BP:   Pulse: 74  Temp:   Resp: 16    Post vital signs: Reviewed and stable  Level of consciousness: sedated  Complications: No apparent anesthesia complications

## 2014-04-03 ENCOUNTER — Telehealth: Payer: Self-pay | Admitting: Family Medicine

## 2014-04-03 ENCOUNTER — Encounter (HOSPITAL_BASED_OUTPATIENT_CLINIC_OR_DEPARTMENT_OTHER): Payer: Self-pay | Admitting: Orthopedic Surgery

## 2014-04-03 NOTE — Telephone Encounter (Signed)
Please call pt.  Cr was slightly worse on recent labs- not emergent, but slightly worse.  Please ask him to either f/u with renal clinic or here, whichever he prefers.  Thanks.

## 2014-04-03 NOTE — Telephone Encounter (Signed)
Unable to reach on mobile phone, left message at home number to call back.

## 2014-04-03 NOTE — Telephone Encounter (Signed)
Patient notified as instructed by telephone and verbalized understanding. Patient stated that he would rather see Dr. Damita Dunnings. Appointment scheduled for Friday 04/05/14.

## 2014-04-05 ENCOUNTER — Encounter: Payer: Self-pay | Admitting: Family Medicine

## 2014-04-05 ENCOUNTER — Ambulatory Visit (INDEPENDENT_AMBULATORY_CARE_PROVIDER_SITE_OTHER): Payer: Medicare Other | Admitting: Family Medicine

## 2014-04-05 VITALS — BP 102/72 | HR 60 | Temp 98.4°F | Wt 208.8 lb

## 2014-04-05 DIAGNOSIS — N032 Chronic nephritic syndrome with diffuse membranous glomerulonephritis: Secondary | ICD-10-CM

## 2014-04-05 DIAGNOSIS — R7989 Other specified abnormal findings of blood chemistry: Secondary | ICD-10-CM

## 2014-04-05 DIAGNOSIS — R748 Abnormal levels of other serum enzymes: Secondary | ICD-10-CM

## 2014-04-05 LAB — BASIC METABOLIC PANEL
BUN: 23 mg/dL (ref 6–23)
CO2: 27 meq/L (ref 19–32)
CREATININE: 1.65 mg/dL — AB (ref 0.40–1.50)
Calcium: 9.8 mg/dL (ref 8.4–10.5)
Chloride: 105 mEq/L (ref 96–112)
GFR: 45.48 mL/min — ABNORMAL LOW (ref >60.00)
Glucose, Bld: 85 mg/dL (ref 70–99)
Potassium: 4.2 mEq/L (ref 3.5–5.1)
Sodium: 138 mEq/L (ref 135–145)

## 2014-04-05 LAB — MICROALBUMIN / CREATININE URINE RATIO
Creatinine,U: 130.4 mg/dL
Microalb Creat Ratio: 27.6 mg/g (ref 0.0–30.0)
Microalb, Ur: 36 mg/dL — ABNORMAL HIGH (ref 0.0–1.9)

## 2014-04-05 NOTE — Patient Instructions (Signed)
Go to the lab on the way out.  We'll contact you with your lab report. Avoid all ibuprofen/aleve/advil.  Tylenol is okay to take.   Take care.  Glad to see you.

## 2014-04-05 NOTE — Progress Notes (Signed)
Pre visit review using our clinic review tool, if applicable. No additional management support is needed unless otherwise documented below in the visit note.  Known AAA, followed by vascular.  D/w pt.   L thumb surgery recently.  Some nausea after anesthesia recently. O/w doing well.  His isn't bothered by having his L hand ROM restricted temporarily with the post op brace  Mild inc in Cr noted, prev with nephritis, treated with steroids years ago.  Cr recently up to 1.5.  No nsaids.  No swelling.  Normal UOP.  BP has been controlled.  D/w pt, f/u labs pending.   Meds, vitals, and allergies reviewed.   ROS: See HPI.  Otherwise, noncontributory.  nad ncat rrr ctab abd soft, not ttp Ext w/o edema L hand braced per ortho.

## 2014-04-07 NOTE — Assessment & Plan Note (Signed)
H/o, had seen Dr. Stevan Born.  See notes on labs, I'll be in touch with the renal clinic and we can make plans from that point.   I had d/w pt about his prev labs and avoiding nsaids, etc.

## 2014-04-10 DIAGNOSIS — Z981 Arthrodesis status: Secondary | ICD-10-CM | POA: Diagnosis not present

## 2014-04-12 ENCOUNTER — Telehealth: Payer: Self-pay | Admitting: Family Medicine

## 2014-04-12 NOTE — Telephone Encounter (Signed)
Please call pt- see most recent BMET result.  I talked with Dr. Moshe Cipro and her clinic will be in touch with patient, likely to adjust his lisinopril dose.  I thank all involved.

## 2014-04-12 NOTE — Telephone Encounter (Signed)
Left message on patient's voicemail to return call

## 2014-04-12 NOTE — Telephone Encounter (Signed)
Patient advised.

## 2014-04-26 DIAGNOSIS — H11153 Pinguecula, bilateral: Secondary | ICD-10-CM | POA: Diagnosis not present

## 2014-04-26 DIAGNOSIS — I10 Essential (primary) hypertension: Secondary | ICD-10-CM | POA: Diagnosis not present

## 2014-05-01 DIAGNOSIS — I129 Hypertensive chronic kidney disease with stage 1 through stage 4 chronic kidney disease, or unspecified chronic kidney disease: Secondary | ICD-10-CM | POA: Diagnosis not present

## 2014-05-08 DIAGNOSIS — S66092S Other specified injury of long flexor muscle, fascia and tendon of left thumb at wrist and hand level, sequela: Secondary | ICD-10-CM | POA: Diagnosis not present

## 2014-05-08 DIAGNOSIS — Z981 Arthrodesis status: Secondary | ICD-10-CM | POA: Diagnosis not present

## 2014-05-23 ENCOUNTER — Other Ambulatory Visit (INDEPENDENT_AMBULATORY_CARE_PROVIDER_SITE_OTHER): Payer: Medicare Other | Admitting: *Deleted

## 2014-05-23 DIAGNOSIS — E785 Hyperlipidemia, unspecified: Secondary | ICD-10-CM | POA: Diagnosis not present

## 2014-05-23 DIAGNOSIS — I129 Hypertensive chronic kidney disease with stage 1 through stage 4 chronic kidney disease, or unspecified chronic kidney disease: Secondary | ICD-10-CM | POA: Diagnosis not present

## 2014-05-23 NOTE — Addendum Note (Signed)
Addended by: Eulis Foster on: 05/23/2014 07:46 AM   Modules accepted: Orders

## 2014-06-13 ENCOUNTER — Other Ambulatory Visit: Payer: Self-pay | Admitting: Family Medicine

## 2014-06-20 DIAGNOSIS — Z981 Arthrodesis status: Secondary | ICD-10-CM | POA: Diagnosis not present

## 2014-07-05 ENCOUNTER — Other Ambulatory Visit: Payer: Self-pay | Admitting: Family Medicine

## 2014-07-05 ENCOUNTER — Other Ambulatory Visit: Payer: Self-pay | Admitting: Cardiovascular Disease

## 2014-07-07 ENCOUNTER — Other Ambulatory Visit: Payer: Self-pay | Admitting: Family Medicine

## 2014-07-18 ENCOUNTER — Other Ambulatory Visit: Payer: Self-pay | Admitting: Family Medicine

## 2014-07-23 ENCOUNTER — Ambulatory Visit (INDEPENDENT_AMBULATORY_CARE_PROVIDER_SITE_OTHER): Payer: Medicare Other | Admitting: Family Medicine

## 2014-07-23 ENCOUNTER — Encounter: Payer: Self-pay | Admitting: Family Medicine

## 2014-07-23 VITALS — BP 124/80 | HR 60 | Temp 97.8°F | Wt 202.8 lb

## 2014-07-23 DIAGNOSIS — M6248 Contracture of muscle, other site: Secondary | ICD-10-CM

## 2014-07-23 DIAGNOSIS — K130 Diseases of lips: Secondary | ICD-10-CM | POA: Diagnosis not present

## 2014-07-23 DIAGNOSIS — M62838 Other muscle spasm: Secondary | ICD-10-CM

## 2014-07-23 MED ORDER — CYCLOBENZAPRINE HCL 10 MG PO TABS: 5.0000 mg | ORAL_TABLET | Freq: Three times a day (TID) | ORAL | Status: DC | PRN

## 2014-07-23 NOTE — Patient Instructions (Signed)
Heat and massage for the muscle spasm.  Take flexeril as needed- muscle relaxer.  Sedation caution.  I would ask your dentist once more about the lip lesions/sensitivity.  If he doesn't have a good idea about treatment, then we can set you up with dermatology or ENT.  Let me know what you hear.   Take care.  Glad to see you.

## 2014-07-23 NOTE — Assessment & Plan Note (Signed)
Unclear source, doesn't appear cancerous.  Would ask for dental input again, we can set up derm/ent if needed.  D/w pt. I want him to update me.

## 2014-07-23 NOTE — Progress Notes (Signed)
Pre visit review using our clinic review tool, if applicable. No additional management support is needed unless otherwise documented below in the visit note.  He saw renal in the meantime, ACE was stopped, has f/u with renal pending.   Hand sx are better.    He went fishing about 5 weeks ago.  He noted a scab on the lower L lip, lasted about 2 weeks.  It wasn't typical for a fever blister.  It resolved in the meantime. Now with small <<1cm irritated white lesions in the upper lip, present for about 2 months.  They predate the other lip lesion.  Eating acidic foods make them hurt more.  He can't eat oranges now "becuase it sets me on fire."  Neck pain.  Along the B trap, the sides of the neck.  Better with massage and heat.  Pain sleeping, sleep disrupted.  He notes spasms in the neck.  No trauma.  Going on for about 2 months, off an on.  No arm or leg sx.  No FCNAVD.  No neck masses.  No rash.  We talked about avoid nsaids.    Meds, vitals, and allergies reviewed.   ROS: See HPI.  Otherwise, noncontributory.  nad ncat Tm wnl Nasal exam wnl OP wnl except for small punctate white lesions seen under the skin on the upper lip mucosa.  No fully external lesions.  No fluctuant mass.  No ulceration.   Lower lip with normal inspection.  Neck supple, no LA but trap ttp B, no midline neck pain.

## 2014-07-23 NOTE — Assessment & Plan Note (Signed)
Should resolve.  Heat. Massage.  Flexeril prn with cautions.  F/u prn.

## 2014-07-31 DIAGNOSIS — Z8521 Personal history of malignant neoplasm of larynx: Secondary | ICD-10-CM | POA: Diagnosis not present

## 2014-07-31 DIAGNOSIS — R490 Dysphonia: Secondary | ICD-10-CM | POA: Diagnosis not present

## 2014-07-31 DIAGNOSIS — R1310 Dysphagia, unspecified: Secondary | ICD-10-CM | POA: Diagnosis not present

## 2014-08-08 DIAGNOSIS — R809 Proteinuria, unspecified: Secondary | ICD-10-CM | POA: Diagnosis not present

## 2014-08-13 ENCOUNTER — Emergency Department (HOSPITAL_COMMUNITY)
Admission: EM | Admit: 2014-08-13 | Discharge: 2014-08-13 | Disposition: A | Discharge status: Home / Self Care | Payer: Medicare Other | Attending: Emergency Medicine | Admitting: Emergency Medicine

## 2014-08-13 ENCOUNTER — Other Ambulatory Visit: Payer: Self-pay | Admitting: *Deleted

## 2014-08-13 ENCOUNTER — Telehealth: Payer: Self-pay | Admitting: Family Medicine

## 2014-08-13 ENCOUNTER — Encounter (HOSPITAL_COMMUNITY): Payer: Self-pay

## 2014-08-13 DIAGNOSIS — Z9889 Other specified postprocedural states: Secondary | ICD-10-CM | POA: Diagnosis not present

## 2014-08-13 DIAGNOSIS — N051 Unspecified nephritic syndrome with focal and segmental glomerular lesions: Secondary | ICD-10-CM | POA: Diagnosis not present

## 2014-08-13 DIAGNOSIS — Z79899 Other long term (current) drug therapy: Secondary | ICD-10-CM | POA: Insufficient documentation

## 2014-08-13 DIAGNOSIS — Z9049 Acquired absence of other specified parts of digestive tract: Secondary | ICD-10-CM | POA: Diagnosis not present

## 2014-08-13 DIAGNOSIS — K5792 Diverticulitis of intestine, part unspecified, without perforation or abscess without bleeding: Secondary | ICD-10-CM | POA: Insufficient documentation

## 2014-08-13 DIAGNOSIS — K5793 Diverticulitis of intestine, part unspecified, without perforation or abscess with bleeding: Secondary | ICD-10-CM

## 2014-08-13 DIAGNOSIS — H9193 Unspecified hearing loss, bilateral: Secondary | ICD-10-CM | POA: Diagnosis not present

## 2014-08-13 DIAGNOSIS — K219 Gastro-esophageal reflux disease without esophagitis: Secondary | ICD-10-CM | POA: Insufficient documentation

## 2014-08-13 DIAGNOSIS — I10 Essential (primary) hypertension: Secondary | ICD-10-CM | POA: Diagnosis not present

## 2014-08-13 DIAGNOSIS — I129 Hypertensive chronic kidney disease with stage 1 through stage 4 chronic kidney disease, or unspecified chronic kidney disease: Secondary | ICD-10-CM | POA: Diagnosis not present

## 2014-08-13 DIAGNOSIS — Z8659 Personal history of other mental and behavioral disorders: Secondary | ICD-10-CM | POA: Diagnosis not present

## 2014-08-13 DIAGNOSIS — Z87448 Personal history of other diseases of urinary system: Secondary | ICD-10-CM | POA: Insufficient documentation

## 2014-08-13 DIAGNOSIS — E785 Hyperlipidemia, unspecified: Secondary | ICD-10-CM | POA: Insufficient documentation

## 2014-08-13 DIAGNOSIS — Z8669 Personal history of other diseases of the nervous system and sense organs: Secondary | ICD-10-CM | POA: Diagnosis not present

## 2014-08-13 DIAGNOSIS — Z7982 Long term (current) use of aspirin: Secondary | ICD-10-CM | POA: Insufficient documentation

## 2014-08-13 DIAGNOSIS — R1032 Left lower quadrant pain: Secondary | ICD-10-CM | POA: Diagnosis present

## 2014-08-13 DIAGNOSIS — Z8589 Personal history of malignant neoplasm of other organs and systems: Secondary | ICD-10-CM | POA: Insufficient documentation

## 2014-08-13 DIAGNOSIS — R809 Proteinuria, unspecified: Secondary | ICD-10-CM | POA: Diagnosis not present

## 2014-08-13 LAB — COMPREHENSIVE METABOLIC PANEL
ALBUMIN: 4.1 g/dL (ref 3.5–5.0)
ALT: 18 U/L (ref 17–63)
AST: 25 U/L (ref 15–41)
Alkaline Phosphatase: 40 U/L (ref 38–126)
Anion gap: 10 (ref 5–15)
BILIRUBIN TOTAL: 0.6 mg/dL (ref 0.3–1.2)
BUN: 31 mg/dL — AB (ref 6–20)
CHLORIDE: 105 mmol/L (ref 101–111)
CO2: 21 mmol/L — ABNORMAL LOW (ref 22–32)
CREATININE: 1.73 mg/dL — AB (ref 0.61–1.24)
Calcium: 9.5 mg/dL (ref 8.9–10.3)
GFR calc Af Amer: 48 mL/min — ABNORMAL LOW (ref >60)
GFR calc non Af Amer: 41 mL/min — ABNORMAL LOW (ref >60)
Glucose, Bld: 97 mg/dL (ref 65–99)
Potassium: 4 mmol/L (ref 3.5–5.1)
Sodium: 136 mmol/L (ref 135–145)
Total Protein: 6.7 g/dL (ref 6.5–8.1)

## 2014-08-13 LAB — CBC WITH DIFFERENTIAL/PLATELET
Basophils Absolute: 0.1 10*3/uL (ref 0.0–0.1)
Basophils Relative: 1 % (ref 0–1)
Eosinophils Absolute: 0.1 10*3/uL (ref 0.0–0.7)
Eosinophils Relative: 2 % (ref 0–5)
HCT: 43.2 % (ref 39.0–52.0)
Hemoglobin: 15 g/dL (ref 13.0–17.0)
LYMPHS ABS: 1.2 10*3/uL (ref 0.7–4.0)
LYMPHS PCT: 19 % (ref 12–46)
MCH: 31.4 pg (ref 26.0–34.0)
MCHC: 34.7 g/dL (ref 30.0–36.0)
MCV: 90.4 fL (ref 78.0–100.0)
MONOS PCT: 8 % (ref 3–12)
Monocytes Absolute: 0.5 10*3/uL (ref 0.1–1.0)
NEUTROS ABS: 4.3 10*3/uL (ref 1.7–7.7)
Neutrophils Relative %: 70 % (ref 43–77)
PLATELETS: 318 10*3/uL (ref 150–400)
RBC: 4.78 MIL/uL (ref 4.22–5.81)
RDW: 12.5 % (ref 11.5–15.5)
WBC: 6.2 10*3/uL (ref 4.0–10.5)

## 2014-08-13 LAB — URINALYSIS, ROUTINE W REFLEX MICROSCOPIC
Bilirubin Urine: NEGATIVE
Glucose, UA: NEGATIVE mg/dL
Hgb urine dipstick: NEGATIVE
Ketones, ur: NEGATIVE mg/dL
Leukocytes, UA: NEGATIVE
Nitrite: NEGATIVE
Protein, ur: NEGATIVE mg/dL
SPECIFIC GRAVITY, URINE: 1.017 (ref 1.005–1.030)
Urobilinogen, UA: 0.2 mg/dL (ref 0.0–1.0)
pH: 5 (ref 5.0–8.0)

## 2014-08-13 LAB — LIPASE, BLOOD: LIPASE: 28 U/L (ref 22–51)

## 2014-08-13 LAB — POC OCCULT BLOOD, ED: Fecal Occult Bld: POSITIVE — AB

## 2014-08-13 MED ORDER — PANTOPRAZOLE SODIUM 40 MG PO TBEC
40.0000 mg | DELAYED_RELEASE_TABLET | Freq: Every day | ORAL | Status: DC
Start: 2014-08-13 — End: 2015-07-19

## 2014-08-13 MED ORDER — ATORVASTATIN CALCIUM 20 MG PO TABS: ORAL_TABLET | ORAL | Status: DC

## 2014-08-13 MED ORDER — CIPROFLOXACIN HCL 500 MG PO TABS: 500.0000 mg | ORAL_TABLET | Freq: Two times a day (BID) | ORAL | Status: DC

## 2014-08-13 MED ORDER — METRONIDAZOLE 500 MG PO TABS: 500.0000 mg | ORAL_TABLET | Freq: Two times a day (BID) | ORAL | Status: DC

## 2014-08-13 MED ORDER — METOPROLOL TARTRATE 25 MG PO TABS: ORAL_TABLET | ORAL | Status: DC

## 2014-08-13 MED ORDER — LEVOTHYROXINE SODIUM 100 MCG PO TABS: ORAL_TABLET | ORAL | Status: DC

## 2014-08-13 NOTE — ED Provider Notes (Signed)
CSN: [id number]     Arrival date & time 08/13/14  1710 History   First MD Initiated Contact with Patient 08/13/14 1925     Chief Complaint  Patient presents with  . Abdominal Pain  . Rectal Bleeding     (Consider location/radiation/quality/duration/timing/severity/associated sxs/prior Treatment) HPI The patient reports he has had diverticulitis in the past. He feels that his symptoms are consistent with prior episode of diverticulitis. He has been expressing bright red bleeding when passing stools. There is not been bleeding in between bowel movements. He has also been experiencing some discomfort in his left lower quadrant and suprapubic area. This has been developing for over a week. Patient does not experience any lightheadedness or dizziness. No fever or vomiting. He feels that this might of been precipitated by eating some small seeded foods. Past Medical History  Diagnosis Date  . 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 HYPERLIPIDEMIA 05/10/2007  . 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 HYPERTENSION 05/10/2007  . ESOPHAGEAL STRICTURE 05/10/2007  . GERD 05/10/2007  . HIATAL HERNIA 05/10/2007  . ALCOHOL ABUSE, HX OF     distant history  . Upper airway cough syndrome     Per Dr. Melvyn Novas, pulmonary   . 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 ANXIETY DEPRESSION 05/10/2007    history of, during difficult relationship  . Anxiety     occ panic sx, increased after death of mother  . Heart disease   . SLEEP APNEA 05/10/2007    lost 100lb-not now   Past Surgical History  Procedure Laterality Date  . Back surgery  1997    lower back x3  . Wrist fracture surgery  08/21/10    Right wrist x4  . Colonoscopy    . Appendectomy    . Coronary stent placement      2.75 x 12 mm Promus Premier DES was deployed in the mid LAD. The stent was post-dilated with a 3.0 x 9 mm [place] balloon - Mid LAD.  Marland Kitchen Left heart catheterization with  coronary angiogram N/A 08/25/2012    Procedure: LEFT HEART CATHETERIZATION WITH CORONARY ANGIOGRAM;  Surgeon: Burnell Blanks, MD;  Location: [facility] CATH LAB;  Service: Cardiovascular;  Laterality: N/A;  . Left heart catheterization with coronary angiogram N/A 11/23/2013    Procedure: LEFT HEART CATHETERIZATION WITH CORONARY ANGIOGRAM;  Surgeon: Jettie Booze, MD;  Location: [facility] CATH LAB;  Service: Cardiovascular;  Laterality: N/A;  . Laryngoscopy / bronchoscopy / esophagoscopy  2012    bx  . Carpometacarpal ([facility]) fusion of thumb Left 04/02/2014    Procedure: CARPOMETACARPAL ([place]) FUSION OF THUMB/LEFT THUMB INTERPHALNGEAL JOINT FUSION;  Surgeon: Jolyn Nap, MD;  Location: [facility];  Service: Orthopedics;  Laterality: Left;   Family History  Problem Relation Age of Onset  . Brain cancer Father   . Dementia Father     h/o brain tumor  . Heart disease Mother   . Hypertension Mother   . Colon cancer Neg Hx   . Prostate cancer Neg Hx   . Heart disease Brother   . Hypertension Brother   . Hyperlipidemia Brother    History  Substance Use Topics  . Smoking status: Former Smoker    Quit date: 09/30/2010  . Smokeless tobacco: Former Systems developer  . Alcohol Use: 2.4 oz/week    4 Cans of beer per week    Review of Systems  10  Systems reviewed and are negative for acute change except as noted in the HPI.   Allergies  Iohexol; Pravastatin; Codeine; Simvastatin; and Statins  Home Medications   Prior to Admission medications   Medication Sig Start Date End Date Taking? Authorizing Provider  aspirin EC 81 MG tablet Take 1 tablet (81 mg total) by mouth daily. 08/25/12  Yes Thayer Headings, MD  cyclobenzaprine (FLEXERIL) 10 MG tablet Take 0.5-1 tablets (5-10 mg total) by mouth 3 (three) times daily as needed for muscle spasms (sedation caution). 07/23/14  Yes Tonia Ghent, MD  fish oil-omega-3 fatty acids 1000 MG capsule Take 1 g by mouth daily.   Yes Historical  Provider, MD  NITROSTAT 0.4 MG SL tablet PLACE 1 TABLET (0.4 MG TOTAL) UNDER THE TONGUE EVERY 5 (FIVE) MINUTES AS NEEDED FOR CHEST PAIN. 07/18/14  Yes Tonia Ghent, MD  atorvastatin (LIPITOR) 20 MG tablet TAKE 1 TABLET (20 MG TOTAL) BY MOUTH DAILY. 08/13/14   Tonia Ghent, MD  ciprofloxacin (CIPRO) 500 MG tablet Take 1 tablet (500 mg total) by mouth 2 (two) times daily. One po bid x 7 days 08/13/14   Charlesetta Shanks, MD  clopidogrel (PLAVIX) 75 MG tablet Take 1 tablet (75 mg total) by mouth daily with breakfast. 08/15/14   Thayer Headings, MD  fenofibrate 160 MG tablet Take 1 tablet (160 mg total) by mouth daily. Patient not taking: Reported on 08/13/2014 02/18/14   Thayer Headings, MD  levothyroxine (SYNTHROID, LEVOTHROID) 100 MCG tablet TAKE 1 TABLET BY MOUTH EVERY DAY BEFORE BREAKFAST 08/13/14   Tonia Ghent, MD  metoprolol tartrate (LOPRESSOR) 25 MG tablet TAKE 1 TABLET (25 MG TOTAL) BY MOUTH 2 (TWO) TIMES DAILY. 08/13/14   Tonia Ghent, MD  metroNIDAZOLE (FLAGYL) 500 MG tablet Take 1 tablet (500 mg total) by mouth 2 (two) times daily. One po bid x 7 days 08/13/14   Charlesetta Shanks, MD  pantoprazole (PROTONIX) 40 MG tablet Take 1 tablet (40 mg total) by mouth daily. 08/13/14   Tonia Ghent, MD   BP 123/80 mmHg  Pulse 77  Temp(Src) 98.4 F (36.9 C) (Oral)  Resp 20  SpO2 98% Physical Exam  Constitutional: He is oriented to person, place, and time. He appears well-developed and well-nourished.  HENT:  Head: Normocephalic and atraumatic.  Eyes: EOM are normal. Pupils are equal, round, and reactive to light.  Neck: Neck supple.  Cardiovascular: Normal rate, regular rhythm, normal heart sounds and intact distal pulses.   Pulmonary/Chest: Effort normal and breath sounds normal.  Abdominal: Soft. Bowel sounds are normal. He exhibits no distension. There is tenderness.  Mild left lower quadrant tenderness without guarding. Remainder of abdomen is soft and nontender.  Genitourinary:   Rectal examination. Normal visual special external anus. Digital examination no stool in the vault. No mass. No blood present to visual inspection.  Musculoskeletal: Normal range of motion. He exhibits no edema.  Neurological: He is alert and oriented to person, place, and time. He has normal strength. Coordination normal. GCS eye subscore is 4. GCS verbal subscore is 5. GCS motor subscore is 6.  Skin: Skin is warm, dry and intact.  Psychiatric: He has a normal mood and affect.    ED Course  Procedures (including critical care time) Labs Review Labs Reviewed  COMPREHENSIVE METABOLIC PANEL - Abnormal; Notable for the following:    CO2 21 (*)    BUN 31 (*)    Creatinine, Ser 1.73 (*)  GFR calc non Af Amer 41 (*)    GFR calc Af Amer 48 (*)    All other components within normal limits  POC OCCULT BLOOD, ED - Abnormal; Notable for the following:    Fecal Occult Bld POSITIVE (*)    All other components within normal limits  CBC WITH DIFFERENTIAL/PLATELET  LIPASE, BLOOD  URINALYSIS, ROUTINE W REFLEX MICROSCOPIC (NOT AT [facility])    Imaging Review No results found.   EKG Interpretation None      MDM   Final diagnoses:  Diverticulitis of intestine without perforation or abscess with bleeding   Patient does not have anemia, orthostasis or findings of surgical abdomen. He is well in appearance. At this time I feel he is safe to initiate outpatient and. Therapy for diverticulitis. The patient has instructions for return with worsening or changing pain, fever, increased bleeding or lightheadedness. Patient has primary care follow-up and management.    Charlesetta Shanks, MD 08/16/14 [phone]

## 2014-08-13 NOTE — ED Notes (Signed)
Pt reports onset 10 days abd pain and bright red bleeding when passing stools.  Usually has BmX2 Q day.  Pt has h/o diverticulitis and is not supposed to eat seeds.  Yesterday he ate tomato seeds and now has diarrhea x 3 today, loose, bright red color.  No c/o dizziness.

## 2014-08-13 NOTE — Telephone Encounter (Signed)
PLEASE NOTE: All timestamps contained within this report are represented as [place] Standard Time. CONFIDENTIALTY NOTICE: This fax transmission is intended only for the addressee. It contains information that is legally privileged, confidential or otherwise protected from use or disclosure. If you are not the intended recipient, you are strictly prohibited from reviewing, disclosing, copying using or disseminating any of this information or taking any action in reliance on or regarding this information. If you have received this fax in error, please notify us immediately by telephone so that we can arrange for its return to [place]. Phone: [phone], Toll-Free: [phone], Fax: [phone] Page: 1 of 2 Call Id: [id number] [place] Patient Name: Tony Hunt Gender: Male DOB: 1956/01/30 Age: 59 Y 1 M 29 D Return Phone Number: [phone] (Primary), [phone] (Secondary) Address[place] [address] Client [facility] - Client Client Site [place] - Day Physician Renford Dills Contact Type Call Call Type Triage / Clinical Relationship To Patient Self Appointment Disposition EMR Appointment Not Necessary Info pasted into Epic Yes Return Phone Number [phone] (Primary) Chief Complaint Blood In Stool Initial Comment Caller states blood in stool PreDisposition Did not know what to do Nurse Assessment Nurse: Luther Parody, RN, Malachy Mood Date/Time (Eastern Time): 08/13/2014 4:29:47 PM Confirm and document reason for call. If symptomatic, describe symptoms. ---Caller states that he has been having blood in his stool for the last wk. Has the patient traveled out of the country within the last 30 days? ---Not Applicable Does the patient require triage? ---Yes Related visit to physician within the last 2 weeks? ---No Does the PT have any  chronic conditions? (i.e. diabetes, asthma, etc.) ---Yes List chronic conditions. ---htn, diverticulitis, high cholesterol Guidelines Guideline Title Affirmed Question Affirmed Notes Nurse Date/Time Eilene Ghazi Time) Rectal Bleeding [1] Large amount of blood AND (2) adult stable Luther Parody, RN, Malachy Mood 08/13/2014 4:30:58 PM Disp. Time Eilene Ghazi Time) Disposition Final User 08/13/2014 4:32:59 PM Go to ED Now Yes Luther Parody, RN, Erskine Speed Understands: Yes Disagree/Comply: Comply Care Advice Given Per Guideline PLEASE NOTE: All timestamps contained within this report are represented as [place] Standard Time. CONFIDENTIALTY NOTICE: This fax transmission is intended only for the addressee. It contains information that is legally privileged, confidential or otherwise protected from use or disclosure. If you are not the intended recipient, you are strictly prohibited from reviewing, disclosing, copying using or disseminating any of this information or taking any action in reliance on or regarding this information. If you have received this fax in error, please notify us immediately by telephone so that we can arrange for its return to [place]. Phone: [phone], Toll-Free: [phone], Fax: [phone] Page: 2 of 2 Call Id: [id number] Care Advice Given Per Guideline DRIVING: Another adult should drive. CARE ADVICE given per Rectal Bleeding (Adult) guideline. GO TO ED NOW: You need to be seen in the Emergency Department. Go to the ER at ___________ [place] now. Drive carefully. After Care Instructions Given Call Event Type User Date / Time Description Referrals [facility] - ED

## 2014-08-13 NOTE — Discharge Instructions (Signed)

## 2014-08-13 NOTE — Telephone Encounter (Signed)
°  Patient Name: Tony Hunt  DOB: [birth year]    Initial Comment Caller states blood in stool   Nurse Assessment  Nurse: Luther Parody RN, Malachy Mood Date/Time (Eastern Time): 08/13/2014 4:29:47 PM  Confirm and document reason for call. If symptomatic, describe symptoms. ---Caller states that he has been having blood in his stool for the last wk.  Has the patient traveled out of the country within the last 30 days? ---Not Applicable  Does the patient require triage? ---Yes  Related visit to physician within the last 2 weeks? ---No  Does the PT have any chronic conditions? (i.e. diabetes, asthma, etc.) ---Yes  List chronic conditions. ---htn, diverticulitis, high cholesterol     Guidelines    Guideline Title Affirmed Question Affirmed Notes  Rectal Bleeding [1] Large amount of blood AND (2) adult stable    Final Disposition User   Go to ED Now Luther Parody, RN, Malachy Mood

## 2014-08-14 NOTE — Telephone Encounter (Signed)
Agree with ER.  Thanks.  

## 2014-08-15 ENCOUNTER — Other Ambulatory Visit: Payer: Self-pay | Admitting: *Deleted

## 2014-08-15 MED ORDER — CLOPIDOGREL BISULFATE 75 MG PO TABS: 75.0000 mg | ORAL_TABLET | Freq: Every day | ORAL | Status: DC

## 2014-08-27 ENCOUNTER — Other Ambulatory Visit: Payer: Self-pay | Admitting: Cardiovascular Disease

## 2014-08-27 ENCOUNTER — Encounter: Payer: Self-pay | Admitting: Cardiovascular Disease

## 2014-08-27 ENCOUNTER — Ambulatory Visit (INDEPENDENT_AMBULATORY_CARE_PROVIDER_SITE_OTHER): Payer: Medicare Other | Admitting: Cardiovascular Disease

## 2014-08-27 VITALS — BP 120/72 | HR 58 | Ht 68.0 in | Wt 198.0 lb

## 2014-08-27 DIAGNOSIS — E785 Hyperlipidemia, unspecified: Secondary | ICD-10-CM

## 2014-08-27 DIAGNOSIS — I2510 Atherosclerotic heart disease of native coronary artery without angina pectoris: Secondary | ICD-10-CM

## 2014-08-27 LAB — LIPID PANEL
CHOLESTEROL: 207 mg/dL — AB (ref 0–200)
HDL: 33.2 mg/dL — ABNORMAL LOW (ref >39.00)
NonHDL: 173.8
Total CHOL/HDL Ratio: 6
Triglycerides: 292 mg/dL — ABNORMAL HIGH (ref 0.0–149.0)
VLDL: 58.4 mg/dL — ABNORMAL HIGH (ref 0.0–40.0)

## 2014-08-27 LAB — LDL CHOLESTEROL, DIRECT: LDL DIRECT: 126 mg/dL

## 2014-08-27 NOTE — Progress Notes (Signed)
[name] Date of Birth  05-21-1955       [place] [address], [place], [place]  [address]   [place], [place]  [address] [phone]     [phone]   Fax  [phone]    Fax [phone]  Problem List: 1. CAD -  08/25/12 - 2.75 x 12 mm Promus Premier DES was deployed in the mid LAD. The stent was post-dilated with a 3.0 x 9 mm [place] balloon - Mid LAD. 2. Hypertension 3. Hypothyroidism 4. hyperlipidemia  History of Present Illness:  [name] is a 59 yo with hx of progressive CP.  These pains have progressed over the past several weeks.  Initially he had only one or 2 times per week. These pains have progressed and he now has several episodes of chest pain each day.  The pains are associated with some dyspnea and lightheadness.   They are not associated with exercise, eating, drinking, change of position. The pains seem to last about 1 minute.  He has not found anything that specifically relieves the pain. They tend to resolve spontaneously.  He had a particularly bad episode of chest discomfort early this week and went to the emergency room. His workup there was unremarkable.  He's had progressive angina. He had an episode of chest pain the office today. The there were no associated EKG changes.  August 30, 2012:  Abdel was seen last week with [facility].  He had a stent placed in his mid left anterior descending artery. He was discharged the next day but started having severe headaches and night sweats. Initially he thought it may be due to the Plavix but ultimately he decided that it seemed to be more related to the Pravachol.  He's feeling much better at this time.  Sept. 23, 2014:  He had several weeks of intermittant CP ( lasted 2-3 minutes) after getting his stent.  Now , these have resolved.  He is back doing all of his normal activities without CP.  He joined the [employer] last week.  He has been 1 time.   He has  noticed some easy bleeding.    11/22/2013:  He has been having more angina CP - typically occurs at rest.  Lasts a few seconds.  Occasionally longer.  Relieved with [employer]. 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   [facility] .   Dec. 18, 2015: And 1 was having some chest pain when I last done in September. Cardiac admission did not reveal any significant stenosis. History of PCI.  Has been doing better. Not exercising as much.  No trouble deer hunting this past fall.   August 27, 2014:  Doing well.  Hunts and fishes regularly . No angina     Current Outpatient Prescriptions on File Prior to Visit  Medication Sig Dispense Refill  . aspirin EC 81 MG tablet Take 1 tablet (81 mg total) by mouth daily. 90 tablet 3  . atorvastatin (LIPITOR) 20 MG tablet TAKE 1 TABLET (20 MG TOTAL) BY MOUTH DAILY. 90 tablet 3  . clopidogrel (PLAVIX) 75 MG tablet Take 1 tablet (75 mg total) by mouth daily with breakfast. 90 tablet 3  . cyclobenzaprine (FLEXERIL) 10 MG tablet Take 0.5-1 tablets (5-10 mg total) by mouth 3 (three) times daily as needed for muscle  spasms (sedation caution). 30 tablet 0  . fenofibrate 160 MG tablet Take 1 tablet (160 mg total) by mouth daily. 30 tablet 6  . fish oil-omega-3 fatty acids 1000 MG capsule Take 1 g by mouth daily.    Marland Kitchen levothyroxine (SYNTHROID, LEVOTHROID) 100 MCG tablet TAKE 1 TABLET BY MOUTH EVERY DAY BEFORE BREAKFAST 90 tablet 3  . metoprolol tartrate (LOPRESSOR) 25 MG tablet TAKE 1 TABLET (25 MG TOTAL) BY MOUTH 2 (TWO) TIMES DAILY. 180 tablet 3  . NITROSTAT 0.4 MG SL tablet PLACE 1 TABLET (0.4 MG TOTAL) UNDER THE TONGUE EVERY 5 (FIVE) MINUTES AS NEEDED FOR CHEST PAIN. 25 tablet 0  . pantoprazole (PROTONIX) 40 MG tablet Take 1 tablet (40 mg total) by mouth daily. 90 tablet 3   No current facility-administered medications on file prior to visit.    Allergies  Allergen Reactions  . Iohexol Anaphylaxis  and Other (See Comments)    OK with 13 hour prep  . Pravastatin Nausea Only and Other (See Comments)    Intense headache  . Codeine Itching  . Simvastatin Other (See Comments)    Edema  . Statins Other (See Comments)    Sig edema on simvastatin    Past Medical History  Diagnosis Date  . 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 HYPERLIPIDEMIA 05/10/2007  . 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 HYPERTENSION 05/10/2007  . ESOPHAGEAL STRICTURE 05/10/2007  . GERD 05/10/2007  . HIATAL HERNIA 05/10/2007  . ALCOHOL ABUSE, HX OF     distant history  . Upper airway cough syndrome     Per Dr. Melvyn Novas, pulmonary   . 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 ANXIETY DEPRESSION 05/10/2007    history of, during difficult relationship  . Anxiety     occ panic sx, increased after death of mother  . Heart disease   . SLEEP APNEA 05/10/2007    lost 100lb-not now    Past Surgical History  Procedure Laterality Date  . Back surgery  1997    lower back x3  . Wrist fracture surgery  08/21/10    Right wrist x4  . Colonoscopy    . Appendectomy    . Coronary stent placement      2.75 x 12 mm Promus Premier DES was deployed in the mid LAD. The stent was post-dilated with a 3.0 x 9 mm [place] balloon - Mid LAD.  Marland Kitchen Left heart catheterization with coronary angiogram N/A 08/25/2012    Procedure: LEFT HEART CATHETERIZATION WITH CORONARY ANGIOGRAM;  Surgeon: Burnell Blanks, MD;  Location: [facility] CATH LAB;  Service: Cardiovascular;  Laterality: N/A;  . Left heart catheterization with coronary angiogram N/A 11/23/2013    Procedure: LEFT HEART CATHETERIZATION WITH CORONARY ANGIOGRAM;  Surgeon: Jettie Booze, MD;  Location: [facility] CATH LAB;  Service: Cardiovascular;  Laterality: N/A;  . Laryngoscopy / bronchoscopy / esophagoscopy  2012    bx  . Carpometacarpal (cmc) fusion of thumb Left 04/02/2014    Procedure: CARPOMETACARPAL ([place]) FUSION OF  THUMB/LEFT THUMB INTERPHALNGEAL JOINT FUSION;  Surgeon: Jolyn Nap, MD;  Location: [facility];  Service: Orthopedics;  Laterality: Left;    History  Smoking status  . Former Smoker  . Quit date: 09/30/2010  Smokeless tobacco  . 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  History  Alcohol  Use  . 2.4 oz/week  . 4 Cans of beer per week    Family History  Problem Relation Age of Onset  . Brain cancer Father   . Dementia Father     h/o brain tumor  . Heart disease Mother   . Hypertension Mother   . Colon cancer Neg Hx   . Prostate cancer Neg Hx   . Heart disease Brother   . Hypertension Brother   . Hyperlipidemia Brother     Reviw of Systems:  Reviewed in the HPI.  All other systems are negative.  Physical Exam: Blood pressure 120/72, pulse 58, height 5\' 8"  (1.727 m), weight 89.812 kg (198 lb). General: Well developed, well nourished, in no acute distress.  Head: Normocephalic, atraumatic, sclera non-icteric, mucus membranes are moist,   Neck: Supple. Carotids are 2 + without bruits. No JVD   Lungs: Clear   Heart: RR, normal S1, S2  Abdomen: Soft, non-tender, non-distended with normal bowel sounds.  Msk:  Strength and tone are normal   Extremities: No clubbing or cyanosis. No edema.  Distal pedal pulses are 2+ and equal .   Pulse is 2 +   Neuro: CN II - XII intact.  Alert and oriented X 3.   Psych:  Normal   ECG: August 27, 2014:  Sinus brady at 31.  Inc. RBBB. , no ST or T wave changes.   Assessment / Plan:   1. CAD -  08/25/12 - 2.75 x 12 mm Promus Premier DES was deployed in the mid LAD. The stent was post-dilated with a 3.0 x 9 mm [place] balloon - Mid LAD. No angina ,  Continue current meds.   2. Hypertension - BP is well controlled.  Continue meds.   3. Hypothyroidism: followed by his medical doctor   4. Hyperlipidemia:  His trigs are elevated.  Encouraged diet and  exercise.  Will check lipids today .    Nahser, Wonda Cheng, MD  08/27/2014 8:33 AM    [place],  [place], [place]  [address] Pager [phone] Phone: [phone]; Fax: [phone]   [facility]  [address] [place], [place]  [address] [phone]    Fax [phone]

## 2014-08-27 NOTE — Patient Instructions (Addendum)
Medication Instructions:  Your physician recommends that you continue on your current medications as directed. Please refer to the Current Medication list given to you today.   Labwork: TODAY - cholesterol Your physician recommends that you return for lab work in: 6 months on the day of or a few days before your office visit with Dr. Acie Fredrickson.  You will need to FAST for this appointment - nothing to eat or drink after midnight the night before except water.    Testing/Procedures: None Ordered   Follow-Up: Your physician wants you to follow-up in: 6 months with Dr. Acie Fredrickson.  You will receive a reminder letter in the mail two months in advance. If you don't receive a letter, please call our office to schedule the follow-up appointment.

## 2014-08-28 ENCOUNTER — Telehealth: Payer: Self-pay | Admitting: Nurse Practitioner

## 2014-08-28 NOTE — Telephone Encounter (Signed)
Reviewed lab results and plan of care with patient who verbalized understanding and agreement to increase Atorvastatin to 40 mg daily.  Patient is scheduled for repeat lab appointment on Tuesday Oct. 4.  Patient states he gets medications through [facility] and will take 2 of his 20 mg Atorvastatin; states someone from his pharmacy will call me to get refills for 40 mg tablets.  I advised him to call back with questions or concerns.

## 2014-08-28 NOTE — Telephone Encounter (Signed)
-----   Message from Thayer Headings, MD sent at 08/27/2014 11:22 AM EDT ----- Increase atorvastatin to 40 a day . Please ask him to work on diet and exercise program . Recheck labs in 3 months

## 2014-09-05 ENCOUNTER — Encounter: Payer: Self-pay | Admitting: Family Medicine

## 2014-09-06 ENCOUNTER — Other Ambulatory Visit: Payer: Self-pay | Admitting: *Deleted

## 2014-09-11 ENCOUNTER — Other Ambulatory Visit: Payer: Self-pay

## 2014-09-11 MED ORDER — FENOFIBRATE 160 MG PO TABS: 160.0000 mg | ORAL_TABLET | Freq: Every day | ORAL | Status: DC

## 2014-09-11 NOTE — Telephone Encounter (Signed)
Pharmacy called into office asking for a verbal refill authorization, which was done.  It was requested for 90 days with one refill instead of 30 day supply with 5 refills.

## 2014-09-23 DIAGNOSIS — N179 Acute kidney failure, unspecified: Secondary | ICD-10-CM | POA: Diagnosis not present

## 2014-09-26 ENCOUNTER — Other Ambulatory Visit: Payer: Self-pay | Admitting: Cardiovascular Disease

## 2014-09-26 NOTE — Telephone Encounter (Signed)
Called pt and spoke with pt's wife Lorriane Shire to let her know that I had contacted [facility] and they stated that the medication lipitor 20 mg had been received by them on September 10, 2014 and that I was to early to request medications at this time. I informed the wife that if the pt or herself have any other problems, questions or concerns to give our office a call. Pt's wife verbalized understanding.

## 2014-10-28 ENCOUNTER — Telehealth: Payer: Self-pay | Admitting: *Deleted

## 2014-10-28 NOTE — Telephone Encounter (Signed)
Patient left a voicemail that Dr. Acie Fredrickson has increased his Atorvastatin from 20 mg to 40 mg a day. Patient stated that he has been taking 2 pills a day and will be out in a few days and can not get a refill until September. Patient stated that he can not get Dr. Elmarie Shiley office to call him back about changing the script. Patient requested that [facility] for a 30 day supply since he will be out in 3 days. Patient stated that his mail order will be sending it out to him in a few weeks, but that script still show 20 mg and he will run out again too soon.  Please advise

## 2014-10-29 MED ORDER — ATORVASTATIN CALCIUM 40 MG PO TABS: ORAL_TABLET | ORAL | Status: DC

## 2014-10-29 NOTE — Telephone Encounter (Signed)
Local and mail order rxs sent.  Thanks.

## 2014-10-30 NOTE — Telephone Encounter (Signed)
Patient notified by telephone. 

## 2014-11-25 ENCOUNTER — Other Ambulatory Visit: Payer: Self-pay | Admitting: Family Medicine

## 2014-12-03 ENCOUNTER — Other Ambulatory Visit (INDEPENDENT_AMBULATORY_CARE_PROVIDER_SITE_OTHER): Payer: Medicare Other | Admitting: *Deleted

## 2014-12-03 DIAGNOSIS — I2510 Atherosclerotic heart disease of native coronary artery without angina pectoris: Secondary | ICD-10-CM | POA: Diagnosis not present

## 2014-12-03 DIAGNOSIS — E785 Hyperlipidemia, unspecified: Secondary | ICD-10-CM

## 2014-12-03 LAB — LIPID PANEL
CHOL/HDL RATIO: 6 ratio — AB (ref <=5.0)
Cholesterol: 162 mg/dL (ref 125–200)
HDL: 27 mg/dL — AB (ref >=40)
LDL CALC: 100 mg/dL (ref <130)
Triglycerides: 177 mg/dL — ABNORMAL HIGH (ref <150)
VLDL: 35 mg/dL — ABNORMAL HIGH (ref <30)

## 2014-12-03 LAB — HEPATIC FUNCTION PANEL
ALBUMIN: 3.7 g/dL (ref 3.6–5.1)
ALK PHOS: 34 U/L — AB (ref 40–115)
ALT: 15 U/L (ref 9–46)
AST: 19 U/L (ref 10–35)
BILIRUBIN INDIRECT: 0.4 mg/dL (ref 0.2–1.2)
BILIRUBIN TOTAL: 0.5 mg/dL (ref 0.2–1.2)
Bilirubin, Direct: 0.1 mg/dL (ref <=0.2)
Total Protein: 6.4 g/dL (ref 6.1–8.1)

## 2014-12-03 LAB — BASIC METABOLIC PANEL
BUN: 21 mg/dL (ref 7–25)
CALCIUM: 9.1 mg/dL (ref 8.6–10.3)
CO2: 24 mmol/L (ref 20–31)
Chloride: 106 mmol/L (ref 98–110)
Creat: 1.28 mg/dL — ABNORMAL HIGH (ref 0.70–1.25)
Glucose, Bld: 98 mg/dL (ref 65–99)
Potassium: 3.9 mmol/L (ref 3.5–5.3)
SODIUM: 139 mmol/L (ref 135–146)

## 2015-01-19 ENCOUNTER — Other Ambulatory Visit: Payer: Self-pay | Admitting: Cardiovascular Disease

## 2015-01-20 ENCOUNTER — Telehealth: Payer: Self-pay | Admitting: Family Medicine

## 2015-01-20 NOTE — Telephone Encounter (Signed)
Pt wants to know who he saw for his anyurism.  Pt can not remember and knows that he is supposed to go backa fter a year and thinks that it is time for his follow up.  Pt requesting cb of name of the specialist.  cb number is [phone] Thank you

## 2015-01-20 NOTE — Telephone Encounter (Signed)
Dr. Trula Slade.  Patient advised and phone number given.

## 2015-01-28 ENCOUNTER — Other Ambulatory Visit: Payer: Self-pay | Admitting: Family Medicine

## 2015-01-28 NOTE — Telephone Encounter (Signed)
Electronic refill request. Last Filled:    25 tablet 0 07/18/2014  Should he need this again this soon?  Please advise.

## 2015-01-29 NOTE — Telephone Encounter (Signed)
Noted, fine, sent. Thanks.

## 2015-01-29 NOTE — Telephone Encounter (Signed)
Spoke to patient and was advised that he is doing fine and has his annual appointment scheduled with his cardiologist 02/13/15. Patient stated that the pharmacy is trying to refill all of his medications because his insurance is no longer going to cover it there after December.

## 2015-01-29 NOTE — Telephone Encounter (Signed)
It may be out of date.   Sent.  Please get update on patient.  Thanks.

## 2015-02-03 DIAGNOSIS — Z8521 Personal history of malignant neoplasm of larynx: Secondary | ICD-10-CM | POA: Diagnosis not present

## 2015-02-03 DIAGNOSIS — R1310 Dysphagia, unspecified: Secondary | ICD-10-CM | POA: Diagnosis not present

## 2015-02-03 DIAGNOSIS — M542 Cervicalgia: Secondary | ICD-10-CM | POA: Diagnosis not present

## 2015-02-04 ENCOUNTER — Encounter: Payer: Self-pay | Admitting: Family Medicine

## 2015-02-04 ENCOUNTER — Ambulatory Visit (INDEPENDENT_AMBULATORY_CARE_PROVIDER_SITE_OTHER): Payer: Medicare Other | Admitting: Family Medicine

## 2015-02-04 VITALS — BP 124/76 | HR 76 | Temp 97.5°F | Wt 200.0 lb

## 2015-02-04 DIAGNOSIS — I2510 Atherosclerotic heart disease of native coronary artery without angina pectoris: Secondary | ICD-10-CM

## 2015-02-04 DIAGNOSIS — R202 Paresthesia of skin: Secondary | ICD-10-CM | POA: Diagnosis not present

## 2015-02-04 DIAGNOSIS — M542 Cervicalgia: Secondary | ICD-10-CM

## 2015-02-04 MED ORDER — CYCLOBENZAPRINE HCL 10 MG PO TABS: 5.0000 mg | ORAL_TABLET | Freq: Three times a day (TID) | ORAL | Status: DC | PRN

## 2015-02-04 NOTE — Patient Instructions (Signed)
Get some soft full length arch support inserts.  That should help.  If not, then let me know.  Stop the lipitor for 1 week and use the flexeril as needed.   Update me next week.  Take care.  Glad to see you.

## 2015-02-04 NOTE — Progress Notes (Signed)
Pre visit review using our clinic review tool, if applicable. No additional management support is needed unless otherwise documented below in the visit note.  Neck pain.  Pain with ROM.  Not stiff but tender.  Started about 2-3 months ago.  No trauma.  No rash.  Back of neck is diffusely sore.  Normal ROM arms and legs.  Changed pillows after the fact w/o relief.  Heating pad helps some.  No other myalgias.  Massage helps for a little while.  On statin.    R 1st and 2nd toes with less sensation.  Going on for a few months.  No other toe sensation changes.  No new shoes, no trauma.  No visible skin changes.    He had ENT f/u yesterday, will have yearly f/u now.   He has f/u with renal pending.    Meds, vitals, and allergies reviewed.   ROS: See HPI.  Otherwise, noncontributory.  nad ncat Neck supple, not stiff, with ROM intact but pain on palpation of B posterior paraspinal muscles, no rash, no ttp in midline.  No anterior pain or LA S/S wnl BUE Sensation to monofilament wnl BLE, no deficit. Able to bear weight but pain on standing at distal 1st MT.  Loss of longitudinal and transverse arch noted R foot.

## 2015-02-05 DIAGNOSIS — R202 Paresthesia of skin: Secondary | ICD-10-CM | POA: Insufficient documentation

## 2015-02-05 DIAGNOSIS — M542 Cervicalgia: Secondary | ICD-10-CM | POA: Insufficient documentation

## 2015-02-05 NOTE — Assessment & Plan Note (Signed)
Likely from benign muscle aches/strain.   Stop the lipitor for 1 week and use the flexeril as needed.   He'll update me.  Okay for outpatient f/u.  Nontoxic.  No alarming sx/findings.

## 2015-02-05 NOTE — Assessment & Plan Note (Signed)
Likely mechanical/compressive.  No sign of systemic disease. D/w pt.  Get some soft full length arch support inserts. That should help. If not, then let me know.

## 2015-02-11 ENCOUNTER — Other Ambulatory Visit: Payer: Self-pay | Admitting: *Deleted

## 2015-02-11 ENCOUNTER — Telehealth: Payer: Self-pay

## 2015-02-11 DIAGNOSIS — M542 Cervicalgia: Secondary | ICD-10-CM

## 2015-02-11 MED ORDER — TIZANIDINE HCL 4 MG PO TABS: 2.0000 mg | ORAL_TABLET | Freq: Three times a day (TID) | ORAL | Status: DC | PRN

## 2015-02-11 NOTE — Telephone Encounter (Signed)
Pt left v/m with update; pt seen 02/04/15 and stopped taking lipitor and pt taking muscle relaxant; pt cannot tell a lot of difference in neck pain; pain basically the same. Pt request cb.

## 2015-02-11 NOTE — Telephone Encounter (Signed)
Thanks

## 2015-02-11 NOTE — Telephone Encounter (Signed)
I think we need to re-image his neck in the meantime.   If getting of lipitor didn't help, then okay to restart.   I put in xray C spine.  When can he come in to get that done?  Okay for it to be done not at [facility].   I would try a different muscle relaxer in the meantime.   I sent tizanidine.   Stop flexeril. Thanks.

## 2015-02-11 NOTE — Telephone Encounter (Signed)
Patient not doing any better.  Patient advised of MD message.  Patient will come in for x-ray tomorrow (Wed) around 10 a.m.  Rx re-sent to [facility] [address] [facility] [place]  [facility]

## 2015-02-12 ENCOUNTER — Ambulatory Visit (INDEPENDENT_AMBULATORY_CARE_PROVIDER_SITE_OTHER)
Admission: RE | Admit: 2015-02-12 | Discharge: 2015-02-12 | Disposition: A | Discharge status: Home / Self Care | Payer: Medicare Other | Source: Ambulatory Visit | Attending: Family Medicine | Admitting: Family Medicine

## 2015-02-12 DIAGNOSIS — M5032 Other cervical disc degeneration, mid-cervical region, unspecified level: Secondary | ICD-10-CM | POA: Diagnosis not present

## 2015-02-12 DIAGNOSIS — M542 Cervicalgia: Secondary | ICD-10-CM

## 2015-02-13 ENCOUNTER — Other Ambulatory Visit: Payer: Self-pay | Admitting: Family Medicine

## 2015-02-13 DIAGNOSIS — I129 Hypertensive chronic kidney disease with stage 1 through stage 4 chronic kidney disease, or unspecified chronic kidney disease: Secondary | ICD-10-CM | POA: Diagnosis not present

## 2015-02-13 DIAGNOSIS — R809 Proteinuria, unspecified: Secondary | ICD-10-CM | POA: Diagnosis not present

## 2015-02-13 DIAGNOSIS — N051 Unspecified nephritic syndrome with focal and segmental glomerular lesions: Secondary | ICD-10-CM | POA: Diagnosis not present

## 2015-02-13 DIAGNOSIS — M542 Cervicalgia: Secondary | ICD-10-CM

## 2015-02-28 DIAGNOSIS — M542 Cervicalgia: Secondary | ICD-10-CM | POA: Diagnosis not present

## 2015-03-13 ENCOUNTER — Other Ambulatory Visit (INDEPENDENT_AMBULATORY_CARE_PROVIDER_SITE_OTHER): Payer: Medicare Other | Admitting: *Deleted

## 2015-03-13 DIAGNOSIS — I10 Essential (primary) hypertension: Secondary | ICD-10-CM

## 2015-03-13 DIAGNOSIS — E785 Hyperlipidemia, unspecified: Secondary | ICD-10-CM

## 2015-03-13 LAB — BASIC METABOLIC PANEL
BUN: 18 mg/dL (ref 7–25)
CHLORIDE: 107 mmol/L (ref 98–110)
CO2: 24 mmol/L (ref 20–31)
CREATININE: 1.33 mg/dL — AB (ref 0.70–1.25)
Calcium: 8.9 mg/dL (ref 8.6–10.3)
Glucose, Bld: 88 mg/dL (ref 65–99)
POTASSIUM: 3.7 mmol/L (ref 3.5–5.3)
SODIUM: 139 mmol/L (ref 135–146)

## 2015-03-13 LAB — HEPATIC FUNCTION PANEL
ALBUMIN: 3.7 g/dL (ref 3.6–5.1)
ALT: 20 U/L (ref 9–46)
AST: 23 U/L (ref 10–35)
Alkaline Phosphatase: 32 U/L — ABNORMAL LOW (ref 40–115)
BILIRUBIN DIRECT: 0.1 mg/dL (ref <=0.2)
BILIRUBIN INDIRECT: 0.5 mg/dL (ref 0.2–1.2)
Total Bilirubin: 0.6 mg/dL (ref 0.2–1.2)
Total Protein: 6.4 g/dL (ref 6.1–8.1)

## 2015-03-13 LAB — LIPID PANEL
CHOLESTEROL: 168 mg/dL (ref 125–200)
HDL: 29 mg/dL — ABNORMAL LOW (ref >=40)
LDL Cholesterol: 101 mg/dL (ref <130)
Total CHOL/HDL Ratio: 5.8 Ratio — ABNORMAL HIGH (ref <=5.0)
Triglycerides: 191 mg/dL — ABNORMAL HIGH (ref <150)
VLDL: 38 mg/dL — ABNORMAL HIGH (ref <30)

## 2015-03-13 NOTE — Addendum Note (Signed)
Addended by: Eulis Foster on: 03/13/2015 07:49 AM   Modules accepted: Orders

## 2015-03-14 DIAGNOSIS — M47812 Spondylosis without myelopathy or radiculopathy, cervical region: Secondary | ICD-10-CM | POA: Diagnosis not present

## 2015-03-18 ENCOUNTER — Ambulatory Visit (INDEPENDENT_AMBULATORY_CARE_PROVIDER_SITE_OTHER): Payer: Medicare Other | Admitting: Cardiovascular Disease

## 2015-03-18 ENCOUNTER — Encounter: Payer: Self-pay | Admitting: Cardiovascular Disease

## 2015-03-18 VITALS — BP 128/78 | HR 62 | Ht 68.0 in | Wt 198.2 lb

## 2015-03-18 DIAGNOSIS — I2510 Atherosclerotic heart disease of native coronary artery without angina pectoris: Secondary | ICD-10-CM

## 2015-03-18 DIAGNOSIS — E785 Hyperlipidemia, unspecified: Secondary | ICD-10-CM

## 2015-03-18 MED ORDER — NITROGLYCERIN 0.4 MG SL SUBL: 0.4000 mg | SUBLINGUAL_TABLET | SUBLINGUAL | Status: DC | PRN

## 2015-03-18 MED ORDER — ATORVASTATIN CALCIUM 80 MG PO TABS: ORAL_TABLET | ORAL | Status: DC

## 2015-03-18 NOTE — Progress Notes (Signed)
Tony Hunt Date of Birth  Mar 31, 1955       [place] [id number], [place], [place]  [address]   [place], [place]  [address] [phone]     [phone]   Fax  [phone]    Fax [phone]  Problem List: 1. CAD -  08/25/12 - 2.75 x 12 mm Promus Premier DES was deployed in the mid LAD. The stent was post-dilated with a 3.0 x 9 mm [place] balloon - Mid LAD. 2. Hypertension 3. Hypothyroidism 4. hyperlipidemia  History of Present Illness:  Tony Hunt is a 60 yo with hx of progressive CP.  These pains have progressed over the past several weeks.  Initially he had only one or 2 times per week. These pains have progressed and he now has several episodes of chest pain each day.  The pains are associated with some dyspnea and lightheadness.   They are not associated with exercise, eating, drinking, change of position. The pains seem to last about 1 minute.  He has not found anything that specifically relieves the pain. They tend to resolve spontaneously.  He had a particularly bad episode of chest discomfort early this week and went to the emergency room. His workup there was unremarkable.  He's had progressive angina. He had an episode of chest pain the office today. The there were no associated EKG changes.  August 30, 2012:  Tony Hunt was seen last week with [facility].  He had a stent placed in his mid left anterior descending artery. He was discharged the next day but started having severe headaches and night sweats. Initially he thought it may be due to the Plavix but ultimately he decided that it seemed to be more related to the Pravachol.  He's feeling much better at this time.  Sept. 23, 2014:  He had several weeks of intermittant CP ( lasted 2-3 minutes) after getting his stent.  Now , these have resolved.  He is back doing all of his normal activities without CP.  He joined the [employer] last week.  He has been 1 time.   He has  noticed some easy bleeding.    11/22/2013:  He has been having more angina CP - typically occurs at rest.  Lasts a few seconds.  Occasionally longer.  Relieved with NTG Walking lots. 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   [facility] .   Dec. 18, 2015: And 1 was having some chest pain when I last done in September. Cardiac admission did not reveal any significant stenosis. History of PCI.  Has been doing better. Not exercising as much.  No trouble deer hunting this past fall.   August 27, 2014:  Doing well.  Hunts and fishes regularly . No angina   Jan. 17, 2017:  Tony Hunt is seen for follow up of his CAD' No significant episodes of CP  Still active.     Current Outpatient Prescriptions on File Prior to Visit  Medication Sig Dispense Refill  . aspirin EC 81 MG tablet Take 1 tablet (81 mg total) by mouth daily. 90 tablet 3  . atorvastatin (LIPITOR) 40 MG tablet 40mg  by mouth daily 90 tablet 3  . clopidogrel (PLAVIX) 75 MG tablet Take 1 tablet (75 mg total) by mouth daily with breakfast. 90 tablet 3  . fenofibrate 160 MG  tablet TAKE 1 TABLET DAILY 90 tablet 0  . fish oil-omega-3 fatty acids 1000 MG capsule Take 1 g by mouth daily.    Marland Kitchen levothyroxine (SYNTHROID, LEVOTHROID) 100 MCG tablet TAKE 1 TABLET BY MOUTH EVERY DAY BEFORE BREAKFAST 90 tablet 3  . metoprolol tartrate (LOPRESSOR) 25 MG tablet TAKE 1 TABLET (25 MG TOTAL) BY MOUTH 2 (TWO) TIMES DAILY. 180 tablet 3  . NITROSTAT 0.4 MG SL tablet PLACE 1 TABLET (0.4 MG TOTAL) UNDER THE TONGUE EVERY 5 (FIVE) MINUTES AS NEEDED FOR CHEST PAIN. 25 tablet 5  . pantoprazole (PROTONIX) 40 MG tablet Take 1 tablet (40 mg total) by mouth daily. 90 tablet 3  . tiZANidine (ZANAFLEX) 4 MG tablet Take 0.5-1 tablets (2-4 mg total) by mouth every 8 (eight) hours as needed for muscle spasms (sedation caution). 30 tablet 0   No current facility-administered medications on file prior to visit.     Allergies  Allergen Reactions  . Iohexol Anaphylaxis and Other (See Comments)    OK with 13 hour prep  . Pravastatin Nausea Only and Other (See Comments)    Intense headache  . Lisinopril Other (See Comments)    Elevated cr- improved off med  . Codeine Itching  . Simvastatin Other (See Comments)    Edema  . Statins Other (See Comments)    Sig edema on simvastatin    Past Medical History  Diagnosis Date  . 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 HYPERLIPIDEMIA 05/10/2007  . 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 HYPERTENSION 05/10/2007  . ESOPHAGEAL STRICTURE 05/10/2007  . GERD 05/10/2007  . HIATAL HERNIA 05/10/2007  . ALCOHOL ABUSE, HX OF     distant history  . Upper airway cough syndrome     Per Dr. Melvyn Novas, pulmonary   . 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 ANXIETY DEPRESSION 05/10/2007    history of, during difficult relationship  . Anxiety     occ panic sx, increased after death of mother  . Heart disease   . SLEEP APNEA 05/10/2007    lost 100lb-not now    Past Surgical History  Procedure Laterality Date  . Back surgery  1997    lower back x3  . Wrist fracture surgery  08/21/10    Right wrist x4  . Colonoscopy    . Appendectomy    . Coronary stent placement      2.75 x 12 mm Promus Premier DES was deployed in the mid LAD. The stent was post-dilated with a 3.0 x 9 mm [place] balloon - Mid LAD.  Marland Kitchen Left heart catheterization with coronary angiogram N/A 08/25/2012    Procedure: LEFT HEART CATHETERIZATION WITH CORONARY ANGIOGRAM;  Surgeon: Burnell Blanks, MD;  Location: [facility] CATH LAB;  Service: Cardiovascular;  Laterality: N/A;  . Left heart catheterization with coronary angiogram N/A 11/23/2013    Procedure: LEFT HEART CATHETERIZATION WITH CORONARY ANGIOGRAM;  Surgeon: Jettie Booze, MD;  Location: [facility] CATH LAB;  Service: Cardiovascular;  Laterality: N/A;  . Laryngoscopy / bronchoscopy  / esophagoscopy  2012    bx  . Carpometacarpal (cmc) fusion of thumb Left 04/02/2014    Procedure: CARPOMETACARPAL ([place]) FUSION OF THUMB/LEFT THUMB INTERPHALNGEAL JOINT FUSION;  Surgeon: Jolyn Nap, MD;  Location: [facility];  Service: Orthopedics;  Laterality: Left;    History  Smoking status  . Former Smoker  . Quit date: 09/30/2010  Smokeless  tobacco  . 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  History  Alcohol Use  . 2.4 oz/week  . 4 Cans of beer per week    Family History  Problem Relation Age of Onset  . Brain cancer Father   . Dementia Father     h/o brain tumor  . Heart disease Mother   . Hypertension Mother   . Colon cancer Neg Hx   . Prostate cancer Neg Hx   . Heart disease Brother   . Hypertension Brother   . Hyperlipidemia Brother     Reviw of Systems:  Reviewed in the HPI.  All other systems are negative.  Physical Exam: There were no vitals taken for this visit. General: Well developed, well nourished, in no acute distress.  Head: Normocephalic, atraumatic, sclera non-icteric, mucus membranes are moist,   Neck: Supple. Carotids are 2 + without bruits. No JVD   Lungs: Clear   Heart: RR, normal S1, S2  Abdomen: Soft, non-tender, non-distended with normal bowel sounds.  Msk:  Strength and tone are normal   Extremities: No clubbing or cyanosis. No edema.  Distal pedal pulses are 2+ and equal .   Pulse is 2 +   Neuro: CN II - XII intact.  Alert and oriented X 3.   Psych:  Normal   ECG: August 27, 2014:  Sinus brady at 47.  Inc. RBBB. , no ST or T wave changes.   Assessment / Plan:   1. CAD -  08/25/12 - 2.75 x 12 mm Promus Premier DES was deployed in the mid LAD. The stent was post-dilated with a 3.0 x 9 mm [place] balloon - Mid LAD. No angina ,  Continue current meds. Will refill NTG   2. Hypertension - BP is well controlled.  Continue meds.   3. Hypothyroidism:  followed by his medical doctor   4. Hyperlipidemia:  His trigs and Chol are mildly elevated.   We have increased his Atorvastatin to 80. Will check fasting lipids, liver, BMP in 3 months   Will see him in 6 months .   Taeshaun Rames, Wonda Cheng, MD  03/18/2015 8:27 AM    [address] [place],  [place], [place]  [address] Pager [phone] Phone: [phone]; Fax: [phone]   [facility]  [address] [place], [place]  [address] [phone]    Fax [phone]

## 2015-03-18 NOTE — Patient Instructions (Addendum)
Medication Instructions:  Your physician recommends that you continue on your current medications as directed. Please refer to the Current Medication list given to you today.   Labwork: Your physician recommends that you return for lab work in: 3 months  You will need to FAST for this appointment - nothing to eat or drink after midnight the night before except water.    Testing/Procedures: None Ordered   Follow-Up: Your physician wants you to follow-up in: 6 months with Dr. Nahser.  You will receive a reminder letter in the mail two months in advance. If you don't receive a letter, please call our office to schedule the follow-up appointment.   If you need a refill on your cardiac medications before your next appointment, please call your pharmacy.   Thank you for choosing [facility]! Chrisotpher Rivero, RN [phone]    

## 2015-03-21 ENCOUNTER — Encounter: Payer: Self-pay | Admitting: Surgery

## 2015-03-25 DIAGNOSIS — M47812 Spondylosis without myelopathy or radiculopathy, cervical region: Secondary | ICD-10-CM | POA: Diagnosis not present

## 2015-03-31 ENCOUNTER — Ambulatory Visit (HOSPITAL_COMMUNITY)
Admission: RE | Admit: 2015-03-31 | Discharge: 2015-03-31 | Disposition: A | Discharge status: Home / Self Care | Payer: Medicare Other | Source: Ambulatory Visit | Attending: Surgery | Admitting: Surgery

## 2015-03-31 ENCOUNTER — Ambulatory Visit (INDEPENDENT_AMBULATORY_CARE_PROVIDER_SITE_OTHER): Payer: Medicare Other | Admitting: Surgery

## 2015-03-31 ENCOUNTER — Encounter: Payer: Self-pay | Admitting: Surgery

## 2015-03-31 VITALS — BP 126/80 | HR 57 | Ht 68.0 in | Wt 200.9 lb

## 2015-03-31 DIAGNOSIS — I2510 Atherosclerotic heart disease of native coronary artery without angina pectoris: Secondary | ICD-10-CM

## 2015-03-31 DIAGNOSIS — I714 Abdominal aortic aneurysm, without rupture, unspecified: Secondary | ICD-10-CM

## 2015-03-31 DIAGNOSIS — I10 Essential (primary) hypertension: Secondary | ICD-10-CM | POA: Insufficient documentation

## 2015-03-31 DIAGNOSIS — E785 Hyperlipidemia, unspecified: Secondary | ICD-10-CM | POA: Diagnosis not present

## 2015-03-31 NOTE — Progress Notes (Signed)
Patient name: Tony Hunt MRN: [id number] DOB: [birth year] Sex: male     Chief Complaint  Patient presents with  . Re-evaluation    1 year f/u AAA    HISTORY OF PRESENT ILLNESS: The patient is back for follow-up of his abdominal aortic aneurysm.  This was initially detected when he underwent a CT scan for abdominal pain which demonstrated diverticulosis.  CT scan showed a maximum diameter of 3 cm.  He continues to be without abdominal pain or back pain.  He denies any neurologic symptoms.  Specifically he denies numbness or weakness in either extremity no slurred speech.  He denies any claudication symptoms.  He has not had any acute medical events since I last saw him.  The patient has a history of laryngeal cancer, status post radiation treatment. He quit smoking 4 years ago at the time of this treatment. He has a history of hypercholesterolemia which is managed with a statin. His blood pressure has been well controlled  Past Medical History  Diagnosis Date  . 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 HYPERLIPIDEMIA 05/10/2007  . 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 HYPERTENSION 05/10/2007  . ESOPHAGEAL STRICTURE 05/10/2007  . GERD 05/10/2007  . HIATAL HERNIA 05/10/2007  . ALCOHOL ABUSE, HX OF     distant history  . Upper airway cough syndrome     Per Dr. Melvyn Novas, pulmonary   . 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 ANXIETY DEPRESSION 05/10/2007    history of, during difficult relationship  . Anxiety     occ panic sx, increased after death of mother  . Heart disease   . SLEEP APNEA 05/10/2007    lost 100lb-not now    Past Surgical History  Procedure Laterality Date  . Back surgery  1997    lower back x3  . Wrist fracture surgery  08/21/10    Right wrist x4  . Colonoscopy    . Appendectomy    . Coronary stent placement      2.75 x 12 mm Promus Premier DES was deployed in the mid LAD. The  stent was post-dilated with a 3.0 x 9 mm [place] balloon - Mid LAD.  Marland Kitchen Left heart catheterization with coronary angiogram N/A 08/25/2012    Procedure: LEFT HEART CATHETERIZATION WITH CORONARY ANGIOGRAM;  Surgeon: Burnell Blanks, MD;  Location: [facility] CATH LAB;  Service: Cardiovascular;  Laterality: N/A;  . Left heart catheterization with coronary angiogram N/A 11/23/2013    Procedure: LEFT HEART CATHETERIZATION WITH CORONARY ANGIOGRAM;  Surgeon: Jettie Booze, MD;  Location: [facility] CATH LAB;  Service: Cardiovascular;  Laterality: N/A;  . Laryngoscopy / bronchoscopy / esophagoscopy  2012    bx  . Carpometacarpal (cmc) fusion of thumb Left 04/02/2014    Procedure: CARPOMETACARPAL ([place]) FUSION OF THUMB/LEFT THUMB INTERPHALNGEAL JOINT FUSION;  Surgeon: Jolyn Nap, MD;  Location: [facility];  Service: Orthopedics;  Laterality: Left;    Social History   Social History  . Marital Status: Married    Spouse Name: N/A  . Number of Children: N/A  . Years of Education: N/A   Occupational History  .       Retired   Social History Main Topics  . Smoking status: Former Smoker    Quit date: 09/30/2010  . Smokeless tobacco: Former Systems developer  . Alcohol Use: 2.4 oz/week    4 Cans of  beer per week  . Drug Use: No  . Sexual Activity: Not on file   Other Topics Concern  . Not on file   Social History Narrative   Married 8/13. Has a grown daughter.   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     Family History  Problem Relation Age of Onset  . Brain cancer Father   . Dementia Father     h/o brain tumor  . Heart disease Mother   . Hypertension Mother   . Colon cancer Neg Hx   . Prostate cancer Neg Hx   . Heart disease Brother   . Hypertension Brother   . Hyperlipidemia Brother     Allergies as of 03/31/2015 - Review Complete 03/31/2015  Allergen Reaction Noted  . Iohexol Anaphylaxis and Other (See Comments) 09/22/2010  . Pravastatin Nausea Only and Other (See Comments) 08/29/2012  .  Lisinopril Other (See Comments) 02/16/2015  . Codeine Itching 05/04/2007  . Simvastatin Other (See Comments) 08/02/2011  . Statins Other (See Comments) 08/02/2011    Current Outpatient Prescriptions on File Prior to Visit  Medication Sig Dispense Refill  . aspirin EC 81 MG tablet Take 1 tablet (81 mg total) by mouth daily. 90 tablet 3  . atorvastatin (LIPITOR) 80 MG tablet 40mg  by mouth daily 90 tablet 3  . clopidogrel (PLAVIX) 75 MG tablet Take 1 tablet (75 mg total) by mouth daily with breakfast. 90 tablet 3  . fenofibrate 160 MG tablet TAKE 1 TABLET DAILY 90 tablet 0  . fish oil-omega-3 fatty acids 1000 MG capsule Take 1 g by mouth daily.    Marland Kitchen levothyroxine (SYNTHROID, LEVOTHROID) 100 MCG tablet TAKE 1 TABLET BY MOUTH EVERY DAY BEFORE BREAKFAST 90 tablet 3  . metoprolol tartrate (LOPRESSOR) 25 MG tablet TAKE 1 TABLET (25 MG TOTAL) BY MOUTH 2 (TWO) TIMES DAILY. 180 tablet 3  . nitroGLYCERIN (NITROSTAT) 0.4 MG SL tablet Place 1 tablet (0.4 mg total) under the tongue every 5 (five) minutes as needed for chest pain (do not exceed 3 pills during one episode). 25 tablet 6  . pantoprazole (PROTONIX) 40 MG tablet Take 1 tablet (40 mg total) by mouth daily. 90 tablet 3  . tiZANidine (ZANAFLEX) 4 MG tablet Take 0.5-1 tablets (2-4 mg total) by mouth every 8 (eight) hours as needed for muscle spasms (sedation caution). 30 tablet 0   No current facility-administered medications on file prior to visit.     REVIEW OF SYSTEMS: See history of present illness for pertinent positives and negatives.  All other systems are negative.  PHYSICAL EXAMINATION:   Vital signs are  Filed Vitals:   03/31/15 0822  BP: 126/80  Pulse: 57  Height: 5\' 8"  (1.727 m)  Weight: 200 lb 14.4 oz (91.128 kg)  SpO2: 96%   Body mass index is 30.55 kg/(m^2). General: The patient appears their stated age. HEENT:  No gross abnormalities Pulmonary:  Non labored breathing Abdomen: Soft and non-tender Musculoskeletal: There  are no major deformities. Neurologic: No focal weakness or paresthesias are detected, Skin: There are no ulcer or rashes noted. Psychiatric: The patient has normal affect. Cardiovascular: There is a regular rate and rhythm without significant murmur appreciated.  No carotid bruits   Diagnostic Studies I have ordered and reviewed his ultrasound.  This shows a maximum diameter of his aorta of 2.8 cm.  Maximum iliac diameter is 2.1  Assessment and Plan: Abdominal aneurysm: The patient will follow-up in 2 years for a repeat ultrasound.  We discussed indications for  repair would be aortic diameter of 5 cm or iliac diameter of 3 cm  Hypercholesterolemia: Continue statin   V. Leia Alf, M.D. [facility] Office: [phone] Pager:  [phone]

## 2015-04-16 DIAGNOSIS — M47812 Spondylosis without myelopathy or radiculopathy, cervical region: Secondary | ICD-10-CM | POA: Diagnosis not present

## 2015-04-18 ENCOUNTER — Other Ambulatory Visit: Payer: Self-pay | Admitting: Cardiovascular Disease

## 2015-05-28 DIAGNOSIS — M47812 Spondylosis without myelopathy or radiculopathy, cervical region: Secondary | ICD-10-CM | POA: Diagnosis not present

## 2015-06-16 ENCOUNTER — Other Ambulatory Visit: Payer: Medicare Other

## 2015-06-17 ENCOUNTER — Other Ambulatory Visit (INDEPENDENT_AMBULATORY_CARE_PROVIDER_SITE_OTHER): Payer: Medicare Other | Admitting: *Deleted

## 2015-06-17 DIAGNOSIS — E785 Hyperlipidemia, unspecified: Secondary | ICD-10-CM | POA: Diagnosis not present

## 2015-06-17 LAB — COMPREHENSIVE METABOLIC PANEL
ALBUMIN: 3.9 g/dL (ref 3.6–5.1)
ALK PHOS: 36 U/L — AB (ref 40–115)
ALT: 16 U/L (ref 9–46)
AST: 21 U/L (ref 10–35)
BUN: 20 mg/dL (ref 7–25)
CALCIUM: 9 mg/dL (ref 8.6–10.3)
CO2: 26 mmol/L (ref 20–31)
Chloride: 107 mmol/L (ref 98–110)
Creat: 1.28 mg/dL — ABNORMAL HIGH (ref 0.70–1.25)
GLUCOSE: 93 mg/dL (ref 65–99)
POTASSIUM: 4 mmol/L (ref 3.5–5.3)
Sodium: 142 mmol/L (ref 135–146)
Total Bilirubin: 0.5 mg/dL (ref 0.2–1.2)
Total Protein: 6.3 g/dL (ref 6.1–8.1)

## 2015-06-17 LAB — LIPID PANEL
CHOL/HDL RATIO: 6.6 ratio — AB (ref <=5.0)
CHOLESTEROL: 184 mg/dL (ref 125–200)
HDL: 28 mg/dL — ABNORMAL LOW (ref >=40)
LDL Cholesterol: 122 mg/dL (ref <130)
TRIGLYCERIDES: 172 mg/dL — AB (ref <150)
VLDL: 34 mg/dL — AB (ref <30)

## 2015-06-18 ENCOUNTER — Telehealth: Payer: Self-pay | Admitting: Nurse Practitioner

## 2015-06-18 DIAGNOSIS — E785 Hyperlipidemia, unspecified: Secondary | ICD-10-CM

## 2015-06-18 MED ORDER — ROSUVASTATIN CALCIUM 10 MG PO TABS: 10.0000 mg | ORAL_TABLET | Freq: Every day | ORAL | Status: DC

## 2015-06-18 MED ORDER — NITROGLYCERIN 0.4 MG SL SUBL: 0.4000 mg | SUBLINGUAL_TABLET | SUBLINGUAL | Status: DC | PRN

## 2015-06-18 NOTE — Telephone Encounter (Signed)
Reviewed results and plan of care with patient who verbalized understanding and agreement to d/c atorvastatin and start rosuvastatin 10 mg daily. Repeat lab appointment scheduled for July 26.  Patient also requests renewal of NTG Rx be sent to [facility].  I advised him to call back with questions or concerns prior to next appointment.

## 2015-06-18 NOTE — Telephone Encounter (Signed)
-----   Message from Thayer Headings, MD sent at 06/17/2015  5:27 PM EDT ----- Chol is still elevated.   DC atorvastatin and start Crestor 10 mg a day  Check labs in 3 months

## 2015-06-28 ENCOUNTER — Other Ambulatory Visit: Payer: Self-pay | Admitting: Cardiovascular Disease

## 2015-07-19 ENCOUNTER — Other Ambulatory Visit: Payer: Self-pay | Admitting: Family Medicine

## 2015-07-31 DIAGNOSIS — Z8521 Personal history of malignant neoplasm of larynx: Secondary | ICD-10-CM | POA: Diagnosis not present

## 2015-07-31 DIAGNOSIS — B370 Candidal stomatitis: Secondary | ICD-10-CM | POA: Diagnosis not present

## 2015-08-07 ENCOUNTER — Other Ambulatory Visit: Payer: Self-pay | Admitting: Family Medicine

## 2015-08-07 NOTE — Telephone Encounter (Signed)
Electronic refill request. Last Filled:    90 tablet 3 08/13/2014  Does he need a TSH?

## 2015-08-08 NOTE — Telephone Encounter (Signed)
Sent. Due for CPE with labs.  Thanks.

## 2015-08-08 NOTE — Telephone Encounter (Signed)
Left detailed message on voicemail.  

## 2015-09-14 ENCOUNTER — Other Ambulatory Visit: Payer: Self-pay | Admitting: Family Medicine

## 2015-09-15 ENCOUNTER — Encounter: Payer: Self-pay | Admitting: Primary Care

## 2015-09-15 ENCOUNTER — Ambulatory Visit (INDEPENDENT_AMBULATORY_CARE_PROVIDER_SITE_OTHER): Payer: Medicare Other | Admitting: Primary Care

## 2015-09-15 ENCOUNTER — Telehealth: Payer: Self-pay | Admitting: Family Medicine

## 2015-09-15 VITALS — BP 120/72 | HR 56 | Temp 98.1°F | Ht 68.0 in | Wt 195.4 lb

## 2015-09-15 DIAGNOSIS — M545 Low back pain, unspecified: Secondary | ICD-10-CM

## 2015-09-15 DIAGNOSIS — I2510 Atherosclerotic heart disease of native coronary artery without angina pectoris: Secondary | ICD-10-CM

## 2015-09-15 LAB — POC URINALSYSI DIPSTICK (AUTOMATED)
Bilirubin, UA: NEGATIVE
Blood, UA: NEGATIVE
Glucose, UA: NEGATIVE
KETONES UA: NEGATIVE
Leukocytes, UA: NEGATIVE
Nitrite, UA: NEGATIVE
PH UA: 6
PROTEIN UA: NEGATIVE
SPEC GRAV UA: 1.015
Urobilinogen, UA: NEGATIVE

## 2015-09-15 NOTE — Patient Instructions (Signed)
Your urine test does not show evidence of infection or kidney stone.  Your symptoms are likely due to a muscle spasm to the muscles of the lower back.  Take Tizanidine tablets that you have at home. Take 1 tablet by mouth three times daily as needed for discomfort.  Apply heat to the area of discomfort three times daily for 20 minute intervals.  It may take 1-2 weeks before you notice complete resolve, however, with the medication and interventions mentioned above you should notice gradual improvement within the next 2-3 days.  It was a pleasure meeting you!  Back Pain, Adult Back pain is very common in adults.The cause of back pain is rarely dangerous and the pain often gets better over time.The cause of your back pain may not be known. Some common causes of back pain include:  Strain of the muscles or ligaments supporting the spine.  Wear and tear (degeneration) of the spinal disks.  Arthritis.  Direct injury to the back. For many people, back pain may return. Since back pain is rarely dangerous, most people can learn to manage this condition on their own. HOME CARE INSTRUCTIONS Watch your back pain for any changes. The following actions may help to lessen any discomfort you are feeling:  Remain active. It is stressful on your back to sit or stand in one place for long periods of time. Do not sit, drive, or stand in one place for more than 30 minutes at a time. Take short walks on even surfaces as soon as you are able.Try to increase the length of time you walk each day.  Exercise regularly as directed by your health care provider. Exercise helps your back heal faster. It also helps avoid future injury by keeping your muscles strong and flexible.  Do not stay in bed.Resting more than 1-2 days can delay your recovery.  Pay attention to your body when you bend and lift. The most comfortable positions are those that put less stress on your recovering back. Always use proper lifting  techniques, including:  Bending your knees.  Keeping the load close to your body.  Avoiding twisting.  Find a comfortable position to sleep. Use a firm mattress and lie on your side with your knees slightly bent. If you lie on your back, put a pillow under your knees.  Avoid feeling anxious or stressed.Stress increases muscle tension and can worsen back pain.It is important to recognize when you are anxious or stressed and learn ways to manage it, such as with exercise.  Take medicines only as directed by your health care provider. Over-the-counter medicines to reduce pain and inflammation are often the most helpful.Your health care provider may prescribe muscle relaxant drugs.These medicines help dull your pain so you can more quickly return to your normal activities and healthy exercise.  Apply ice to the injured area:  Put ice in a plastic bag.  Place a towel between your skin and the bag.  Leave the ice on for 20 minutes, 2-3 times a day for the first 2-3 days. After that, ice and heat may be alternated to reduce pain and spasms.  Maintain a healthy weight. Excess weight puts extra stress on your back and makes it difficult to maintain good posture. SEEK MEDICAL CARE IF:  You have pain that is not relieved with rest or medicine.  You have increasing pain going down into the legs or buttocks.  You have pain that does not improve in one week.  You have night  pain.  You lose weight.  You have a fever or chills. SEEK IMMEDIATE MEDICAL CARE IF:   You develop new bowel or bladder control problems.  You have unusual weakness or numbness in your arms or legs.  You develop nausea or vomiting.  You develop abdominal pain.  You feel faint.   This information is not intended to replace advice given to you by your health care provider. Make sure you discuss any questions you have with your health care provider.   Document Released: 02/15/2005 Document Revised: 03/08/2014  Document Reviewed: 06/19/2013 Elsevier Interactive Patient Education [employer].

## 2015-09-15 NOTE — Telephone Encounter (Signed)
Patient Name: AUGUSTO BUCKO  DOB: 1956-01-19    Initial Comment Caller has hx of kidney problems, this weekend his back has been hurting and burning. still hurting.   Nurse Assessment  Nurse: Leilani Merl, RN, Heather Date/Time (Eastern Time): 09/15/2015 9:51:31 AM  Confirm and document reason for call. If symptomatic, describe symptoms. You must click the next button to save text entered. ---Caller has hx of kidney problems, this weekend his back has been hurting and burning. still hurting.  Has the patient traveled out of the country within the last 30 days? ---Not Applicable  Does the patient have any new or worsening symptoms? ---Yes  Will a triage be completed? ---Yes  Related visit to physician within the last 2 weeks? ---No  Does the PT have any chronic conditions? (i.e. diabetes, asthma, etc.) ---Yes  List chronic conditions. ---see MR  Is this a behavioral health or substance abuse call? ---No     Guidelines    Guideline Title Affirmed Question Affirmed Notes  Flank Pain MODERATE pain (e.g., interferes with normal activities or awakens from sleep)    Final Disposition User   See Physician within 24 Hours Stand[place], RN, Water quality scientist    Comments  Appt with Alma Friendly at 12:15 pm today.   Referrals  REFERRED TO PCP OFFICE   Disagree/Comply: Comply

## 2015-09-15 NOTE — Addendum Note (Signed)
Addended by: Jacqualin Combes on: 09/15/2015 01:05 PM   Modules accepted: Orders

## 2015-09-15 NOTE — Progress Notes (Addendum)
Subjective:    Patient ID: Tony Hunt, male    DOB: 03-Sep-1955, 60 y.o.   MRN: [id number]  HPI  Mr. Velarde is a 60 year old male with a history of renal insufficiency and muscle spasms who presents today with a chief complaint of back pain. His pain is located to his right lower part of his back that has been present since Friday night last week.   He's noticed difficulty with getting into comfortable positions as he cannot lay or sit comfortably. Denies recent injury/trauma, numbness/tingling. He describes his pain as burning, sharp, stinging and does not radiate anywhere. He denies urinary frequency, hematuria, urgency, fevers. He's not taken anything OTC for his symptoms.  Review of Systems  Constitutional: Negative for fever and chills.  Genitourinary: Negative for dysuria, frequency, hematuria and difficulty urinating.  Musculoskeletal: Positive for back pain.       Past Medical History  Diagnosis Date  . 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 HYPERLIPIDEMIA 05/10/2007  . 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 HYPERTENSION 05/10/2007  . ESOPHAGEAL STRICTURE 05/10/2007  . GERD 05/10/2007  . HIATAL HERNIA 05/10/2007  . ALCOHOL ABUSE, HX OF     distant history  . Upper airway cough syndrome     Per Dr. Melvyn Novas, pulmonary   . 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 ANXIETY DEPRESSION 05/10/2007    history of, during difficult relationship  . Anxiety     occ panic sx, increased after death of mother  . Heart disease   . SLEEP APNEA 05/10/2007    lost 100lb-not now     Social History   Social History  . Marital Status: Married    Spouse Name: N/A  . Number of Children: N/A  . Years of Education: N/A   Occupational History  .       Retired   Social History Main Topics  . Smoking status: Former Smoker    Quit date: 09/30/2010  . Smokeless tobacco: Former Systems developer  . Alcohol Use: 2.4 oz/week   4 Cans of beer per week  . Drug Use: No  . Sexual Activity: Not on file   Other Topics Concern  . Not on file   Social History Narrative   Married 8/13. Has a grown daughter.   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     Past Surgical History  Procedure Laterality Date  . Back surgery  1997    lower back x3  . Wrist fracture surgery  08/21/10    Right wrist x4  . Colonoscopy    . Appendectomy    . Coronary stent placement      2.75 x 12 mm Promus Premier DES was deployed in the mid LAD. The stent was post-dilated with a 3.0 x 9 mm [place] balloon - Mid LAD.  Marland Kitchen Left heart catheterization with coronary angiogram N/A 08/25/2012    Procedure: LEFT HEART CATHETERIZATION WITH CORONARY ANGIOGRAM;  Surgeon: Burnell Blanks, MD;  Location: [facility] CATH LAB;  Service: Cardiovascular;  Laterality: N/A;  . Left heart catheterization with coronary angiogram N/A 11/23/2013    Procedure: LEFT HEART CATHETERIZATION WITH CORONARY ANGIOGRAM;  Surgeon: Jettie Booze, MD;  Location: [facility] CATH LAB;  Service: Cardiovascular;  Laterality: N/A;  . Laryngoscopy / bronchoscopy / esophagoscopy  2012    bx  . Carpometacarpal (cmc) fusion of thumb Left 04/02/2014    Procedure: CARPOMETACARPAL ([place])  FUSION OF THUMB/LEFT THUMB INTERPHALNGEAL JOINT FUSION;  Surgeon: Jolyn Nap, MD;  Location: [place];  Service: Orthopedics;  Laterality: Left;    Family History  Problem Relation Age of Onset  . Brain cancer Father   . Dementia Father     h/o brain tumor  . Heart disease Mother   . Hypertension Mother   . Colon cancer Neg Hx   . Prostate cancer Neg Hx   . Heart disease Brother   . Hypertension Brother   . Hyperlipidemia Brother     Allergies  Allergen Reactions  . Iohexol Anaphylaxis and Other (See Comments)    OK with 13 hour prep  . Pravastatin Nausea Only and Other (See Comments)    Intense headache  . Lisinopril Other (See Comments)    Elevated cr- improved off med  . Codeine Itching  .  Simvastatin Other (See Comments)    Edema  . Statins Other (See Comments)    Sig edema on simvastatin    Current Outpatient Prescriptions on File Prior to Visit  Medication Sig Dispense Refill  . aspirin EC 81 MG tablet Take 1 tablet (81 mg total) by mouth daily. 90 tablet 3  . clopidogrel (PLAVIX) 75 MG tablet TAKE 1 TABLET DAILY WITH BREAKFAST 90 tablet 2  . fenofibrate 160 MG tablet Take 1 tablet (160 mg total) by mouth daily. 90 tablet 3  . fish oil-omega-3 fatty acids 1000 MG capsule Take 1 g by mouth daily.    Marland Kitchen levothyroxine (SYNTHROID, LEVOTHROID) 100 MCG tablet TAKE 1 TABLET DAILY BEFORE BREAKFAST 90 tablet 0  . metoprolol tartrate (LOPRESSOR) 25 MG tablet TAKE 1 TABLET (25 MG TOTAL) BY MOUTH 2 (TWO) TIMES DAILY. 180 tablet 3  . nitroGLYCERIN (NITROSTAT) 0.4 MG SL tablet Place 1 tablet (0.4 mg total) under the tongue every 5 (five) minutes as needed for chest pain (do not exceed 3 pills during one episode). 25 tablet 6  . pantoprazole (PROTONIX) 40 MG tablet TAKE 1 TABLET DAILY 90 tablet 2  . rosuvastatin (CRESTOR) 10 MG tablet Take 1 tablet (10 mg total) by mouth daily. 90 tablet 3  . tiZANidine (ZANAFLEX) 4 MG tablet Take 0.5-1 tablets (2-4 mg total) by mouth every 8 (eight) hours as needed for muscle spasms (sedation caution). 30 tablet 0   No current facility-administered medications on file prior to visit.    BP 120/72 mmHg  Pulse 56  Temp(Src) 98.1 F (36.7 C) (Oral)  Ht 5\' 8"  (1.727 m)  Wt 195 lb 6.4 oz (88.633 kg)  BMI 29.72 kg/m2  SpO2 97%    Objective:   Physical Exam  Constitutional: He appears well-nourished.  Cardiovascular: Normal rate and regular rhythm.   Pulmonary/Chest: Effort normal and breath sounds normal.  Abdominal: Soft. There is no tenderness. There is no CVA tenderness.  Musculoskeletal:  Reproducible pain upon palpation to right lower back over latissimus dorsi/obliques.   Skin: Skin is warm and dry.          Assessment & Plan:  Low  Back Pain:  Located to right lower back since Friday night. No recent injury/trauma, urinary symptoms. UA: Negative Suspect muscle spasms as pain is reproducible to latissimus dorsi/oblique region of right side. Will have him take Tizanidine tablets that he has at home, application of heat, stretches, report back if no improvement in 1 week. Return precautions provided.  Sheral Flow, NP

## 2015-09-15 NOTE — Telephone Encounter (Signed)
Pt has appt with Allie Bossier NP on 09/15/15 at 12:15.

## 2015-09-15 NOTE — Progress Notes (Signed)
Pre visit review using our clinic review tool, if applicable. No additional management support is needed unless otherwise documented below in the visit note. 

## 2015-09-15 NOTE — Telephone Encounter (Signed)
Noted and evaluated. 

## 2015-09-24 ENCOUNTER — Other Ambulatory Visit (INDEPENDENT_AMBULATORY_CARE_PROVIDER_SITE_OTHER): Payer: Medicare Other

## 2015-09-24 DIAGNOSIS — I10 Essential (primary) hypertension: Secondary | ICD-10-CM

## 2015-09-24 DIAGNOSIS — E785 Hyperlipidemia, unspecified: Secondary | ICD-10-CM

## 2015-09-24 LAB — LIPID PANEL
CHOL/HDL RATIO: 5.8 ratio — AB (ref <=5.0)
Cholesterol: 192 mg/dL (ref 125–200)
HDL: 33 mg/dL — AB (ref >=40)
LDL Cholesterol: 122 mg/dL (ref <130)
TRIGLYCERIDES: 186 mg/dL — AB (ref <150)
VLDL: 37 mg/dL — ABNORMAL HIGH (ref <30)

## 2015-09-24 LAB — COMPREHENSIVE METABOLIC PANEL
ALBUMIN: 4 g/dL (ref 3.6–5.1)
ALK PHOS: 34 U/L — AB (ref 40–115)
ALT: 12 U/L (ref 9–46)
AST: 19 U/L (ref 10–35)
BILIRUBIN TOTAL: 0.5 mg/dL (ref 0.2–1.2)
BUN: 18 mg/dL (ref 7–25)
CALCIUM: 9.4 mg/dL (ref 8.6–10.3)
CO2: 25 mmol/L (ref 20–31)
Chloride: 107 mmol/L (ref 98–110)
Creat: 1.57 mg/dL — ABNORMAL HIGH (ref 0.70–1.25)
Glucose, Bld: 86 mg/dL (ref 65–99)
POTASSIUM: 4.3 mmol/L (ref 3.5–5.3)
Sodium: 140 mmol/L (ref 135–146)
Total Protein: 6.5 g/dL (ref 6.1–8.1)

## 2015-09-24 NOTE — Addendum Note (Signed)
Addended by: Velna Ochs on: 09/24/2015 08:20 AM   Modules accepted: Orders

## 2015-09-25 ENCOUNTER — Telehealth: Payer: Self-pay | Admitting: Cardiovascular Disease

## 2015-09-25 MED ORDER — ROSUVASTATIN CALCIUM 20 MG PO TABS: 20.0000 mg | ORAL_TABLET | Freq: Every day | ORAL | 11 refills | Status: DC

## 2015-09-25 NOTE — Telephone Encounter (Signed)
New message ° ° ° ° °Pt returning nurse call. Please call.  °

## 2015-09-25 NOTE — Progress Notes (Signed)
LM 7/27

## 2015-09-25 NOTE — Telephone Encounter (Signed)
Results reviewed with patient who states he has been taking Crestor 10 mg daily and fenofibrate 160 mg daily.  I advised him to increase crestor to 20 mg daily and continue fenofibrate.  He is scheduled for follow-up in October with Dr. Acie Fredrickson and we will plan repeat lab work at that time.

## 2015-10-20 ENCOUNTER — Encounter: Payer: Self-pay | Admitting: Family Medicine

## 2015-10-20 ENCOUNTER — Other Ambulatory Visit: Payer: Self-pay | Admitting: Family Medicine

## 2015-10-20 DIAGNOSIS — Z125 Encounter for screening for malignant neoplasm of prostate: Secondary | ICD-10-CM

## 2015-10-20 DIAGNOSIS — E785 Hyperlipidemia, unspecified: Secondary | ICD-10-CM

## 2015-10-20 DIAGNOSIS — M109 Gout, unspecified: Secondary | ICD-10-CM

## 2015-10-20 DIAGNOSIS — I10 Essential (primary) hypertension: Secondary | ICD-10-CM

## 2015-10-20 DIAGNOSIS — R7309 Other abnormal glucose: Secondary | ICD-10-CM

## 2015-10-21 ENCOUNTER — Other Ambulatory Visit (INDEPENDENT_AMBULATORY_CARE_PROVIDER_SITE_OTHER): Payer: Medicare Other

## 2015-10-21 DIAGNOSIS — E785 Hyperlipidemia, unspecified: Secondary | ICD-10-CM | POA: Diagnosis not present

## 2015-10-21 DIAGNOSIS — I10 Essential (primary) hypertension: Secondary | ICD-10-CM

## 2015-10-21 DIAGNOSIS — R7309 Other abnormal glucose: Secondary | ICD-10-CM

## 2015-10-21 DIAGNOSIS — M109 Gout, unspecified: Secondary | ICD-10-CM | POA: Diagnosis not present

## 2015-10-21 DIAGNOSIS — Z125 Encounter for screening for malignant neoplasm of prostate: Secondary | ICD-10-CM | POA: Diagnosis not present

## 2015-10-21 LAB — URIC ACID: Uric Acid, Serum: 4.7 mg/dL (ref 4.0–7.8)

## 2015-10-21 LAB — TSH: TSH: 2.49 u[IU]/mL (ref 0.35–4.50)

## 2015-10-21 LAB — HEMOGLOBIN A1C: Hgb A1c MFr Bld: 5.7 % (ref 4.6–6.5)

## 2015-10-21 LAB — BASIC METABOLIC PANEL
BUN: 19 mg/dL (ref 6–23)
CALCIUM: 9.8 mg/dL (ref 8.4–10.5)
CHLORIDE: 104 meq/L (ref 96–112)
CO2: 30 meq/L (ref 19–32)
CREATININE: 1.39 mg/dL (ref 0.40–1.50)
GFR: 55.15 mL/min — ABNORMAL LOW (ref >60.00)
GLUCOSE: 91 mg/dL (ref 70–99)
Potassium: 4.7 mEq/L (ref 3.5–5.1)
Sodium: 140 mEq/L (ref 135–145)

## 2015-10-21 LAB — PSA, MEDICARE: PSA: 1.08 ng/ml (ref 0.10–4.00)

## 2015-10-28 ENCOUNTER — Ambulatory Visit (INDEPENDENT_AMBULATORY_CARE_PROVIDER_SITE_OTHER): Payer: Medicare Other | Admitting: Family Medicine

## 2015-10-28 ENCOUNTER — Encounter: Payer: Self-pay | Admitting: Family Medicine

## 2015-10-28 ENCOUNTER — Other Ambulatory Visit: Payer: Self-pay | Admitting: *Deleted

## 2015-10-28 VITALS — BP 122/70 | HR 59 | Temp 98.4°F | Wt 194.2 lb

## 2015-10-28 DIAGNOSIS — Z0000 Encounter for general adult medical examination without abnormal findings: Secondary | ICD-10-CM | POA: Diagnosis not present

## 2015-10-28 DIAGNOSIS — C329 Malignant neoplasm of larynx, unspecified: Secondary | ICD-10-CM | POA: Diagnosis not present

## 2015-10-28 DIAGNOSIS — E785 Hyperlipidemia, unspecified: Secondary | ICD-10-CM

## 2015-10-28 DIAGNOSIS — R7309 Other abnormal glucose: Secondary | ICD-10-CM | POA: Diagnosis not present

## 2015-10-28 DIAGNOSIS — Z23 Encounter for immunization: Secondary | ICD-10-CM | POA: Diagnosis not present

## 2015-10-28 DIAGNOSIS — E039 Hypothyroidism, unspecified: Secondary | ICD-10-CM | POA: Diagnosis not present

## 2015-10-28 DIAGNOSIS — I2510 Atherosclerotic heart disease of native coronary artery without angina pectoris: Secondary | ICD-10-CM | POA: Diagnosis not present

## 2015-10-28 DIAGNOSIS — Z119 Encounter for screening for infectious and parasitic diseases, unspecified: Secondary | ICD-10-CM

## 2015-10-28 DIAGNOSIS — M109 Gout, unspecified: Secondary | ICD-10-CM

## 2015-10-28 MED ORDER — TIZANIDINE HCL 4 MG PO TABS: 2.0000 mg | ORAL_TABLET | Freq: Three times a day (TID) | ORAL | 1 refills | Status: DC | PRN

## 2015-10-28 MED ORDER — LEVOTHYROXINE SODIUM 100 MCG PO TABS: 100.0000 ug | ORAL_TABLET | Freq: Every day | ORAL | 3 refills | Status: DC

## 2015-10-28 MED ORDER — ROSUVASTATIN CALCIUM 20 MG PO TABS: 20.0000 mg | ORAL_TABLET | Freq: Every day | ORAL | 3 refills | Status: DC

## 2015-10-28 NOTE — Progress Notes (Signed)
I have personally reviewed the Medicare Annual Wellness questionnaire and have noted 1. The patient's medical and social history 2. Their use of alcohol, tobacco or illicit drugs 3. Their current medications and supplements 4. The patient's functional ability including ADL's, fall risks, home safety risks and hearing or visual             impairment. 5. Diet and physical activities 6. Evidence for depression or mood disorders  The patients weight, height, BMI have been recorded in the chart and visual acuity is per eye clinic.  I have made referrals, counseling and provided education to the patient based review of the above and I have provided the pt with a written personalized care plan for preventive services.  Provider list updated- see scanned forms.  Routine anticipatory guidance given to patient.  See health maintenance.  Flu 2017 Shingles 2016 PNA not due Tetanus 2014 Colonoscopy 2012 Prostate cancer screening 2017, d/w pt.  PSA wnl Advance directive- wife designated if patient were incapacitated.   Cognitive function addressed- see scanned forms- and if abnormal then additional documentation follows.  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   Pt opts in for HCV screening.  D/w pt re: routine screening with next set of labs.    Hypothyroid.  No ADE on med.  Compliant. No neck mass or lumps.   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  No new voice changes or pain.  Has seen ENT yearly.  No known return of CA.   Elevated Cholesterol: Using medications without problems:yes Muscle aches: no Diet compliance:yes, he is working on diet.  Exercise:yes Intentional weight loss noted.   Recheck lipids to be done later per cards.    CAD.  No CP, SOB, BLE edema.   AAA per vascular surgery.  D/w pt.  Has had routine f/u, didn't meet criteria for intervention prev.   He is walking more in the meantime.   PMH and SH reviewed  Meds, vitals, and allergies reviewed.   ROS: Per HPI.   Unless specifically indicated otherwise in HPI, the patient denies:  General: fever. Eyes: acute vision changes ENT: sore throat Cardiovascular: chest pain Respiratory: SOB GI: vomiting GU: dysuria Musculoskeletal: acute back pain Derm: acute rash Neuro: acute motor dysfunction Psych: worsening mood Endocrine: polydipsia Heme: bleeding Allergy: hayfever  GEN: nad, alert and oriented HEENT: mucous membranes moist NECK: supple w/o LA CV: rrr. PULM: ctab, no inc wob ABD: soft, +bs EXT: no edema SKIN: no acute rash but 5 AKs on L hand, dorsum, 1 on L ear, 1 on L temple.  All frozen x3 w/liq N2, no complications.

## 2015-10-28 NOTE — Patient Instructions (Addendum)
Call about an eye exam.   [facility] [address] [place], [place] [address] [phone]  The spots should heal up after blistering over.  Update me as needed.  Take care.  Glad to see you.

## 2015-10-28 NOTE — Progress Notes (Signed)
Pre visit review using our clinic review tool, if applicable. No additional management support is needed unless otherwise documented below in the visit note. 

## 2015-10-29 NOTE — Assessment & Plan Note (Signed)
Uric acid not elevated. Continue as is.

## 2015-10-29 NOTE — Assessment & Plan Note (Signed)
Controlled, no thyromegaly. Continue as is. TSH within normal limits.

## 2015-10-29 NOTE — Assessment & Plan Note (Signed)
Flu 2017 Shingles 2016 PNA not due Tetanus 2014 Colonoscopy 2012 Prostate cancer screening 2017, d/w pt.  PSA wnl Advance directive- wife designated if patient were incapacitated.   Cognitive function addressed- see scanned forms- and if abnormal then additional documentation follows.  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   Pt opts in for HCV screening.  D/w pt re: routine screening with next set of labs.

## 2015-10-29 NOTE — Assessment & Plan Note (Signed)
History of, A1c now at goal. Continue as is.

## 2015-10-29 NOTE — Assessment & Plan Note (Signed)
No chest pain shortness of breath or leg edema. Continue current medicines as is. He agrees. Labs discussed with patient.

## 2015-10-29 NOTE — Assessment & Plan Note (Signed)
Continue statin. Reasonable to recheck lipids later on per cards.

## 2015-10-29 NOTE — Assessment & Plan Note (Signed)
OP exam without abnormality noted. No lymphadenopathy. He has had routine ENT follow-up.

## 2015-11-27 ENCOUNTER — Telehealth: Payer: Self-pay

## 2015-11-27 NOTE — Telephone Encounter (Signed)
Pt left v/m; pt has hx of diverticulitis and pt ate food with seeds 3 days ago and lower abdomen is hurting with pain level at 7-8. Pt thinks had fever earlier but not now; Pt has been nauseated but no vomiting. Pt had normal BM this morning at 8 AM. Pt prefers not to schedule appt but  Pt request med sent to [place]. Last diverticulitis episode was 6 months ago. Pt request cb.

## 2015-11-27 NOTE — Telephone Encounter (Signed)
Patient advised.   Patient says when someone gets to his house, he will decide what to do.  If he can make it through the night without going to ER, he will call first thing in the AM for an appt here.

## 2015-11-27 NOTE — Telephone Encounter (Signed)
Needs eval asap, with pain level as is.  UC/here/ED.   Thanks.

## 2015-11-28 ENCOUNTER — Encounter: Payer: Self-pay | Admitting: Cardiovascular Disease

## 2015-12-02 DIAGNOSIS — R208 Other disturbances of skin sensation: Secondary | ICD-10-CM | POA: Diagnosis not present

## 2015-12-02 DIAGNOSIS — L814 Other melanin hyperpigmentation: Secondary | ICD-10-CM | POA: Diagnosis not present

## 2015-12-02 DIAGNOSIS — D225 Melanocytic nevi of trunk: Secondary | ICD-10-CM | POA: Diagnosis not present

## 2015-12-02 DIAGNOSIS — L570 Actinic keratosis: Secondary | ICD-10-CM | POA: Diagnosis not present

## 2015-12-09 DIAGNOSIS — B001 Herpesviral vesicular dermatitis: Secondary | ICD-10-CM | POA: Diagnosis not present

## 2015-12-15 ENCOUNTER — Ambulatory Visit (INDEPENDENT_AMBULATORY_CARE_PROVIDER_SITE_OTHER): Payer: Medicare Other | Admitting: Cardiovascular Disease

## 2015-12-15 ENCOUNTER — Encounter: Payer: Self-pay | Admitting: Cardiovascular Disease

## 2015-12-15 VITALS — BP 120/80 | HR 45 | Ht 68.0 in | Wt 190.8 lb

## 2015-12-15 DIAGNOSIS — R001 Bradycardia, unspecified: Secondary | ICD-10-CM | POA: Insufficient documentation

## 2015-12-15 DIAGNOSIS — I2510 Atherosclerotic heart disease of native coronary artery without angina pectoris: Secondary | ICD-10-CM

## 2015-12-15 MED ORDER — METOPROLOL TARTRATE 25 MG PO TABS: 12.5000 mg | ORAL_TABLET | Freq: Two times a day (BID) | ORAL | 3 refills | Status: DC

## 2015-12-15 NOTE — Patient Instructions (Signed)
Medication Instructions:  DECREASE Metoprolol to 12.5 mg twice daily   Labwork: Your physician recommends that you return for lab work in: 6 months on the day of or a few days before your office visit with Dr. Acie Fredrickson.  You will need to FAST for this appointment - nothing to eat or drink after midnight the night before except water.   Testing/Procedures: None Ordered   Follow-Up: Your physician wants you to follow-up in: 6 months with Dr. Acie Fredrickson.  You will receive a reminder letter in the mail two months in advance. If you don't receive a letter, please call our office to schedule the follow-up appointment.   If you need a refill on your cardiac medications before your next appointment, please call your pharmacy.   Thank you for choosing [facility]! Christen Bame, RN [phone]

## 2015-12-15 NOTE — Progress Notes (Signed)
Tony Hunt Date of Birth  1955/07/19       [place] [id number], [place], [place]  [address]   [place], [place]  [address] [phone]     [phone]   Fax  [phone]    Fax [phone]  Problem List: 1. CAD -  08/25/12 - 2.75 x 12 mm Promus Premier DES was deployed in the mid LAD. The stent was post-dilated with a 3.0 x 9 mm [place] balloon - Mid LAD. 2. Hypertension 3. Hypothyroidism 4. hyperlipidemia  History of Present Illness:  Tony Hunt is a 60 yo with hx of progressive CP.  These pains have progressed over the past several weeks.  Initially he had only one or 2 times per week. These pains have progressed and he now has several episodes of chest pain each day.  The pains are associated with some dyspnea and lightheadness.   They are not associated with exercise, eating, drinking, change of position. The pains seem to last about 1 minute.  He has not found anything that specifically relieves the pain. They tend to resolve spontaneously.  He had a particularly bad episode of chest discomfort early this week and went to the emergency room. His workup there was unremarkable.  He's had progressive angina. He had an episode of chest pain the office today. The there were no associated EKG changes.  August 30, 2012:  Tony Hunt was seen last week with [facility]AP.  He had a stent placed in his mid left anterior descending artery. He was discharged the next day but started having severe headaches and night sweats. Initially he thought it may be due to the Plavix but ultimately he decided that it seemed to be more related to the Pravachol.  He's feeling much better at this time.  Sept. 23, 2014:  He had several weeks of intermittant CP ( lasted 2-3 minutes) after getting his stent.  Now , these have resolved.  He is back doing all of his normal activities without CP.  He joined the [employer] last week.  He has been 1 time.   He has  noticed some easy bleeding.    11/22/2013:  He has been having more angina CP - typically occurs at rest.  Lasts a few seconds.  Occasionally longer.  Relieved with NTG Walking lots. 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   [facility] not [facility] .   Dec. 18, 2015: And 1 was having some chest pain when I last done in September. Cardiac admission did not reveal any significant stenosis. History of PCI.  Has been doing better. Not exercising as much.  No trouble deer hunting this past fall.   August 27, 2014:  Doing well.  Hunts and fishes regularly . No angina   Jan. 17, 2017:  Tony Hunt is seen for follow up of his CAD' No significant episodes of CP  Still active.    Oct. 16, 2017:  Tony Hunt is seen today for follow up of his CAD. 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    Current Outpatient Prescriptions on File Prior to Visit  Medication Sig Dispense Refill  . aspirin EC 81 MG tablet Take 1 tablet (81 mg total) by mouth daily. 90 tablet 3  . clopidogrel (PLAVIX) 75 MG tablet TAKE 1  TABLET DAILY WITH BREAKFAST 90 tablet 2  . fenofibrate 160 MG tablet Take 1 tablet (160 mg total) by mouth daily. 90 tablet 3  . fish oil-omega-3 fatty acids 1000 MG capsule Take 1 g by mouth daily.    Marland Kitchen levothyroxine (SYNTHROID, LEVOTHROID) 100 MCG tablet Take 1 tablet (100 mcg total) by mouth daily before breakfast. 90 tablet 3  . metoprolol tartrate (LOPRESSOR) 25 MG tablet TAKE 1 TABLET TWICE A DAY 180 tablet 2  . nitroGLYCERIN (NITROSTAT) 0.4 MG SL tablet Place 1 tablet (0.4 mg total) under the tongue every 5 (five) minutes as needed for chest pain (do not exceed 3 pills during one episode). 25 tablet 6  . pantoprazole (PROTONIX) 40 MG tablet TAKE 1 TABLET DAILY 90 tablet 2  . rosuvastatin (CRESTOR) 20 MG tablet Take 1 tablet (20 mg total) by mouth daily. 90 tablet 3  . tiZANidine (ZANAFLEX) 4 MG tablet Take 0.5-1  tablets (2-4 mg total) by mouth every 8 (eight) hours as needed for muscle spasms (sedation caution). 30 tablet 1   No current facility-administered medications on file prior to visit.     Allergies  Allergen Reactions  . Iohexol Anaphylaxis and Other (See Comments)    OK with 13 hour prep  . Pravastatin Nausea Only and Other (See Comments)    Intense headache  . Lisinopril Other (See Comments)    Elevated cr- improved off med  . Codeine Itching  . Iodine-131 Other (See Comments)    Passed out  . Simvastatin Other (See Comments)    Edema  . Statins Other (See Comments)    Sig edema on simvastatin    Past Medical History:  Diagnosis Date  . ALCOHOL ABUSE, HX OF    distant history  . Anxiety    occ panic sx, increased after death of mother  . ANXIETY DEPRESSION 05/10/2007   history of, during difficult relationship  . 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 ESOPHAGEAL STRICTURE 05/10/2007  . GERD 05/10/2007  . Heart disease   . HIATAL HERNIA 05/10/2007  . HYPERLIPIDEMIA 05/10/2007  . HYPERTENSION 05/10/2007  . 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 SLEEP APNEA 05/10/2007   lost 100lb-not now  . Upper airway cough syndrome    Per Dr. Melvyn Novas, pulmonary     Past Surgical History:  Procedure Laterality Date  . APPENDECTOMY    . BACK SURGERY  1997   lower back x3  . CARPOMETACARPAL ([facility]) FUSION OF THUMB Left 04/02/2014   Procedure: CARPOMETACARPAL ([place]) FUSION OF THUMB/LEFT THUMB INTERPHALNGEAL JOINT FUSION;  Surgeon: Jolyn Nap, MD;  Location: [place];  Service: Orthopedics;  Laterality: Left;  . COLONOSCOPY    . CORONARY STENT PLACEMENT     2.75 x 12 mm Promus Premier DES was deployed in the mid LAD. The stent was post-dilated with a 3.0 x 9 mm [place] balloon - Mid LAD.  Marland Kitchen LARYNGOSCOPY / BRONCHOSCOPY / ESOPHAGOSCOPY  2012   bx  . LEFT HEART  CATHETERIZATION WITH CORONARY ANGIOGRAM N/A 08/25/2012   Procedure: LEFT HEART CATHETERIZATION WITH CORONARY ANGIOGRAM;  Surgeon: Burnell Blanks, MD;  Location: [facility] CATH LAB;  Service: Cardiovascular;  Laterality: N/A;  . LEFT HEART CATHETERIZATION WITH CORONARY ANGIOGRAM N/A 11/23/2013   Procedure: LEFT HEART CATHETERIZATION WITH CORONARY ANGIOGRAM;  Surgeon: Jettie Booze, MD;  [facility] LAB;  Service:  Cardiovascular;  Laterality: N/A;  . WRIST FRACTURE SURGERY  08/21/10   Right wrist x4    History  Smoking Status  . Former Smoker  . Quit date: 09/30/2010  Smokeless Tobacco  . 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  History  Alcohol Use  . Yes    Comment: 2-3 drinks a week at most    Family History  Problem Relation Age of Onset  . Brain cancer Father   . Dementia Father     h/o brain tumor  . Heart disease Mother   . Hypertension Mother   . Heart disease Brother   . Hypertension Brother   . Hyperlipidemia Brother   . Colon cancer Neg Hx   . Prostate cancer Neg Hx     Reviw of Systems:  Reviewed in the HPI.  All other systems are negative.  Physical Exam: Blood pressure 120/80, pulse (!) 45, height 5\' 8"  (1.727 m), weight 190 lb 12.8 oz (86.5 kg). General: Well developed, well nourished, in no acute distress.  Head: Normocephalic, atraumatic, sclera non-icteric, mucus membranes are moist,   Neck: Supple. Carotids are 2 + without bruits. No JVD   Lungs: Clear   Heart: RR, normal S1, S2  Abdomen: Soft, non-tender, non-distended with normal bowel sounds.  Msk:  Strength and tone are normal   Extremities: No clubbing or cyanosis. No edema.  Distal pedal pulses are 2+ and equal .   Pulse is 2 +   Neuro: CN II - XII intact.  Alert and oriented X 3.   Psych:  Normal   ECG: Oct. 17, 2017:    Sinus  brady at 45.  Inc. RBBB   Assessment / Plan:   1. CAD -  08/25/12 - 2.75 x 12  mm Promus Premier DES was deployed in the mid LAD. The stent was post-dilated with a 3.0 x 9 mm [place] balloon - Mid LAD. No angina ,   Will refill NTG   2. Hypertension - BP is well controlled.  Continue meds.   3. Hypothyroidism: followed by his medical doctor .   4. Hyperlipidemia:  His trigs and Chol are mildly elevated.   He has increased his crestor to 20  Will check fasting lipids, liver, BMP in 6 months   5. Bradycardia - his energy levels have been low.   will decrease the metoprolol to 12. 5 BID   Will see him in 6 months .   Mertie Moores, MD  12/15/2015 8:45 AM    [place],  [place], [place]  [address] Pager [phone] Phone: [phone]; Fax: [phone]

## 2016-01-13 DIAGNOSIS — L821 Other seborrheic keratosis: Secondary | ICD-10-CM | POA: Diagnosis not present

## 2016-01-13 DIAGNOSIS — L570 Actinic keratosis: Secondary | ICD-10-CM | POA: Diagnosis not present

## 2016-01-13 DIAGNOSIS — B078 Other viral warts: Secondary | ICD-10-CM | POA: Diagnosis not present

## 2016-01-14 ENCOUNTER — Other Ambulatory Visit: Payer: Self-pay | Admitting: Physical Medicine and Rehabilitation

## 2016-01-14 DIAGNOSIS — M542 Cervicalgia: Principal | ICD-10-CM

## 2016-01-14 DIAGNOSIS — M47812 Spondylosis without myelopathy or radiculopathy, cervical region: Secondary | ICD-10-CM | POA: Diagnosis not present

## 2016-01-14 DIAGNOSIS — G8929 Other chronic pain: Secondary | ICD-10-CM

## 2016-01-16 ENCOUNTER — Ambulatory Visit
Admission: RE | Admit: 2016-01-16 | Discharge: 2016-01-16 | Disposition: A | Discharge status: Home / Self Care | Payer: Medicare Other | Source: Ambulatory Visit | Attending: Physical Medicine and Rehabilitation | Admitting: Physical Medicine and Rehabilitation

## 2016-01-16 DIAGNOSIS — M542 Cervicalgia: Principal | ICD-10-CM

## 2016-01-16 DIAGNOSIS — G8929 Other chronic pain: Secondary | ICD-10-CM

## 2016-01-16 DIAGNOSIS — M50223 Other cervical disc displacement at C6-C7 level: Secondary | ICD-10-CM | POA: Diagnosis not present

## 2016-02-05 DIAGNOSIS — M47812 Spondylosis without myelopathy or radiculopathy, cervical region: Secondary | ICD-10-CM | POA: Diagnosis not present

## 2016-02-16 DIAGNOSIS — N051 Unspecified nephritic syndrome with focal and segmental glomerular lesions: Secondary | ICD-10-CM | POA: Diagnosis not present

## 2016-02-16 DIAGNOSIS — R809 Proteinuria, unspecified: Secondary | ICD-10-CM | POA: Diagnosis not present

## 2016-02-16 DIAGNOSIS — I129 Hypertensive chronic kidney disease with stage 1 through stage 4 chronic kidney disease, or unspecified chronic kidney disease: Secondary | ICD-10-CM | POA: Diagnosis not present

## 2016-02-16 DIAGNOSIS — Z6832 Body mass index (BMI) 32.0-32.9, adult: Secondary | ICD-10-CM | POA: Diagnosis not present

## 2016-03-26 ENCOUNTER — Other Ambulatory Visit: Payer: Self-pay | Admitting: Cardiovascular Disease

## 2016-03-26 DIAGNOSIS — M47812 Spondylosis without myelopathy or radiculopathy, cervical region: Secondary | ICD-10-CM | POA: Diagnosis not present

## 2016-04-06 DIAGNOSIS — M47812 Spondylosis without myelopathy or radiculopathy, cervical region: Secondary | ICD-10-CM | POA: Diagnosis not present

## 2016-04-12 ENCOUNTER — Other Ambulatory Visit: Payer: Self-pay | Admitting: Cardiovascular Disease

## 2016-04-15 ENCOUNTER — Encounter: Payer: Self-pay | Admitting: Family Medicine

## 2016-04-15 ENCOUNTER — Ambulatory Visit (INDEPENDENT_AMBULATORY_CARE_PROVIDER_SITE_OTHER): Payer: Medicare Other | Admitting: Family Medicine

## 2016-04-15 DIAGNOSIS — R1030 Lower abdominal pain, unspecified: Secondary | ICD-10-CM | POA: Diagnosis not present

## 2016-04-15 DIAGNOSIS — R399 Unspecified symptoms and signs involving the genitourinary system: Secondary | ICD-10-CM | POA: Diagnosis not present

## 2016-04-15 MED ORDER — TAMSULOSIN HCL 0.4 MG PO CAPS: 0.4000 mg | ORAL_CAPSULE | Freq: Every day | ORAL | 12 refills | Status: DC

## 2016-04-15 MED ORDER — CIPROFLOXACIN HCL 500 MG PO TABS: 500.0000 mg | ORAL_TABLET | Freq: Two times a day (BID) | ORAL | 0 refills | Status: DC

## 2016-04-15 MED ORDER — METRONIDAZOLE 500 MG PO TABS: 500.0000 mg | ORAL_TABLET | Freq: Three times a day (TID) | ORAL | 0 refills | Status: DC

## 2016-04-15 NOTE — Progress Notes (Signed)
Pre visit review using our clinic review tool, if applicable. No additional management support is needed unless otherwise documented below in the visit note. 

## 2016-04-15 NOTE — Patient Instructions (Signed)
Presumed diverticulitis.  Start cipro and flagyl.   Clear liquids for now.  Update me as needed.    When better, try flomax for urination.  Take care.  Glad to see you.

## 2016-04-15 NOTE — Progress Notes (Signed)
Rare use of NTG.  He doesn't recall the last time he had to take it.    Abd cramping and grumbling.  Started about 1 week ago.  Painful.  Bloating.  No vomiting.  Some loose stools.  Some blood in stool.  H/o diverticulitis.  This feels similar.  He started on clear liquids.  Lower abd pain.  No dysuria.  No fevers now but likely had one the other night, not last night.    Sleep is disrupted.  He gets up to urinate.  Trouble getting back to sleep.  PSA prev wnl in 2017.  Stream is steady but slow.    His nipples have been sore, some better today. No mass felt.    Meds, vitals, and allergies reviewed.   ROS: Per HPI unless specifically indicated in ROS section   GEN: nad, alert and oriented, slightly uncomfortable but clearly nontoxic. HEENT: mucous membranes moist NECK: supple w/o LA CV: rrr.  no murmur PULM: ctab, no inc wob ABD: soft, bilateral lower abd pain, normal BS, no rebound.   EXT: no edema SKIN: no acute rash

## 2016-04-16 ENCOUNTER — Other Ambulatory Visit: Payer: Self-pay | Admitting: Family Medicine

## 2016-04-16 DIAGNOSIS — R399 Unspecified symptoms and signs involving the genitourinary system: Secondary | ICD-10-CM | POA: Insufficient documentation

## 2016-04-16 NOTE — Assessment & Plan Note (Signed)
Likely diverticulitis. More important to treat with antibiotics and send him for CT scan at this point. He agrees. Still okay for outpatient follow-up. Routine cautions given for medication and condition. Clear liquid diet. Update me as needed. He agrees.

## 2016-04-16 NOTE — Assessment & Plan Note (Signed)
Likely from BPH. PSA previously normal. Discussed with patient. Reasonable to try Flomax once his abdominal pain has resolved. I think this is a separate issue. He agrees.

## 2016-04-19 ENCOUNTER — Telehealth: Payer: Self-pay

## 2016-04-19 NOTE — Telephone Encounter (Signed)
Patient called [facility] after hours on Friday concerned with possible side effects from metronidazole and cipro r/x's.  [facility] urged him to take with food and call back if symptoms not improving and follow up with PCP.   I called patient this am to follow up on condition.  He reports feeling much better after taking meds with food and denies any complaints today.   He will call us if any further concerns develop.

## 2016-04-19 NOTE — Telephone Encounter (Signed)
Glad he is better.  Thanks.

## 2016-04-26 DIAGNOSIS — M47812 Spondylosis without myelopathy or radiculopathy, cervical region: Secondary | ICD-10-CM | POA: Diagnosis not present

## 2016-05-26 ENCOUNTER — Ambulatory Visit (INDEPENDENT_AMBULATORY_CARE_PROVIDER_SITE_OTHER): Payer: Medicare Other | Admitting: Cardiovascular Disease

## 2016-05-26 ENCOUNTER — Encounter: Payer: Self-pay | Admitting: Cardiovascular Disease

## 2016-05-26 ENCOUNTER — Encounter (INDEPENDENT_AMBULATORY_CARE_PROVIDER_SITE_OTHER): Payer: Self-pay

## 2016-05-26 VITALS — BP 132/70 | HR 69 | Ht 68.0 in | Wt 211.8 lb

## 2016-05-26 DIAGNOSIS — R0989 Other specified symptoms and signs involving the circulatory and respiratory systems: Secondary | ICD-10-CM | POA: Diagnosis not present

## 2016-05-26 DIAGNOSIS — I25110 Atherosclerotic heart disease of native coronary artery with unstable angina pectoris: Secondary | ICD-10-CM | POA: Diagnosis not present

## 2016-05-26 DIAGNOSIS — E782 Mixed hyperlipidemia: Secondary | ICD-10-CM | POA: Diagnosis not present

## 2016-05-26 DIAGNOSIS — I200 Unstable angina: Secondary | ICD-10-CM

## 2016-05-26 DIAGNOSIS — I2510 Atherosclerotic heart disease of native coronary artery without angina pectoris: Secondary | ICD-10-CM | POA: Diagnosis not present

## 2016-05-26 MED ORDER — ISOSORBIDE MONONITRATE ER 30 MG PO TB24: 30.0000 mg | ORAL_TABLET | Freq: Every day | ORAL | 11 refills | Status: DC

## 2016-05-26 NOTE — Patient Instructions (Addendum)
Medication Instructions:  START Imdur (Isosorbide) 30 mg once daily   Labwork: TODAY - CBC, CMET, PT/INR, cholesterol   Testing/Procedures: Your physician has requested that you have a cardiac catheterization. Cardiac catheterization is used to diagnose and/or treat various heart conditions. Doctors may recommend this procedure for a number of different reasons. The most common reason is to evaluate chest pain. Chest pain can be a symptom of coronary artery disease (CAD), and cardiac catheterization can show whether plaque is narrowing or blocking your heart's arteries. This procedure is also used to evaluate the valves, as well as measure the blood flow and oxygen levels in different parts of your heart. For further information please visit [email or web address]. Please follow instruction sheet, as given.  Your physician has requested that you have a carotid duplex. This test is an ultrasound of the carotid arteries in your neck. It looks at blood flow through these arteries that supply the brain with blood. Allow one hour for this exam. There are no restrictions or special instructions.    Follow-Up: Your physician wants you to follow-up in: 3 months with Dr. Acie Fredrickson.  You will receive a reminder letter in the mail two months in advance. If you don't receive a letter, please call our office to schedule the follow-up appointment.   If you need a refill on your cardiac medications before your next appointment, please call your pharmacy.   Thank you for choosing [facility]! Christen Bame, RN [phone]     [facility] [place] [facility] [address], [place] [address] [place] [address] Dept: [phone] Loc: [phone]  Tony Hunt  05/26/2016  You are scheduled for a Cardiac Catheterization on Friday, March 30 with Dr. Daneen Schick.  1. Please arrive at the [facility] (Main Entrance A) at [facility]: [place], [place] [address] at 8:30 AM (two hours before your procedure to ensure your preparation). Free valet parking service is available.   Special note: Every effort is made to have your procedure done on time. Please understand that emergencies sometimes delay scheduled procedures.  2. Diet: Do not eat or drink anything after midnight prior to your procedure except sips of water to take medications.  3. Labs: You will need to have blood drawn on Wednesday, March 28 at [facility] at [facility]. [address]  Open: 7:30am - 5pm    Phone: [phone]. You do not need to be fasting.  4. Medication instructions in preparation for your procedure:   On the morning of your procedure, take your Plavix/Clopidogrel and Aspirin and any morning medicines NOT listed above.  You may use sips of water.  5. Plan for one night stay--bring personal belongings. 6. Bring a current list of your medications and current insurance cards. 7. You MUST have a responsible person to drive you home. 8. Someone MUST be with you the first 24 hours after you arrive home or your discharge will be delayed. 9. Please wear clothes that are easy to get on and off and wear slip-on shoes.  Thank you for allowing [place] to care for you!   -- [facility]

## 2016-05-26 NOTE — Progress Notes (Signed)
Tony Hunt Date of Birth  1955-03-20       [name] [address], [address], [place], [place]  [address]   [place], [place]  [address] [phone]     [phone]   Fax  [phone]    Fax [phone]  Problem List: 1. CAD -  08/25/12 - 2.75 x 12 mm Promus Premier DES was deployed in the mid LAD. The stent was post-dilated with a 3.0 x 9 mm [place] balloon - Mid LAD. 2. Hypertension 3. Hypothyroidism 4. hyperlipidemia  Previous Notes :   Tony Hunt is a 61 yo with hx of progressive CP.  These pains have progressed over the past several weeks.  Initially he had only one or 2 times per week. These pains have progressed and he now has several episodes of chest pain each day.  The pains are associated with some dyspnea and lightheadness.   They are not associated with exercise, eating, drinking, change of position. The pains seem to last about 1 minute.  He has not found anything that specifically relieves the pain. They tend to resolve spontaneously.  He had a particularly bad episode of chest discomfort early this week and went to the emergency room. His workup there was unremarkable.  He's had progressive angina. He had an episode of chest pain the office today. The there were no associated EKG changes.  August 30, 2012:  Tony Hunt was seen last week with [facility].  He had a stent placed in his mid left anterior descending artery. He was discharged the next day but started having severe headaches and night sweats. Initially he thought it may be due to the Plavix but ultimately he decided that it seemed to be more related to the Pravachol.  He's feeling much better at this time.  Sept. 23, 2014:  He had several weeks of intermittant CP ( lasted 2-3 minutes) after getting his stent.  Now , these have resolved.  He is back doing all of his normal activities without CP.  He joined the [employer] last week.  He has been 1 time.   He has noticed  some easy bleeding.    11/22/2013:  He has been having more angina CP - typically occurs at rest.  Lasts a few seconds.  Occasionally longer.  Relieved with NTG Walking lots. 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   [facility] not [facility] as [facility] .   Dec. 18, 2015: And 1 was having some chest pain when I last done in September. Cardiac admission did not reveal any significant stenosis. History of PCI.  Has been doing better. Not exercising as much.  No trouble deer hunting this past fall.   August 27, 2014:  Doing well.  Hunts and fishes regularly . No angina   Jan. 17, 2017:  Tony Hunt is seen for follow up of his CAD' No significant episodes of CP  Still active.    Oct. 16, 2017:  Tony Hunt is seen today for follow up of his CAD. 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  May 26, 2016:  Tony Hunt has had marked dyspnea at rest and with exertion.  No fever, no cough Chest tightness / pain .   radiates through to back .  Last 5-10 minutes, less if he takes SL NTG .  Similar to angina  prior to stenting in 2014  Has been going on for 2 weeks.   Got worse this weekend Took 3 SL NTG.      Current Outpatient Prescriptions on File Prior to Visit  Medication Sig Dispense Refill  . aspirin EC 81 MG tablet Take 1 tablet (81 mg total) by mouth daily. 90 tablet 3  . ciprofloxacin (CIPRO) 500 MG tablet Take 1 tablet (500 mg total) by mouth 2 (two) times daily. 20 tablet 0  . clopidogrel (PLAVIX) 75 MG tablet Take 1 tablet (75 mg total) by mouth daily with breakfast. 90 tablet 2  . fenofibrate 160 MG tablet Take 1 tablet (160 mg total) by mouth daily. 90 tablet 2  . fish oil-omega-3 fatty acids 1000 MG capsule Take 1 g by mouth daily.    Marland Kitchen levothyroxine (SYNTHROID, LEVOTHROID) 100 MCG tablet Take 1 tablet (100 mcg total) by mouth daily before breakfast. 90 tablet 3  . metoprolol tartrate (LOPRESSOR) 25 MG tablet Take 0.5  tablets (12.5 mg total) by mouth 2 (two) times daily. 90 tablet 3  . metroNIDAZOLE (FLAGYL) 500 MG tablet Take 1 tablet (500 mg total) by mouth 3 (three) times daily. 30 tablet 0  . nitroGLYCERIN (NITROSTAT) 0.4 MG SL tablet Place 1 tablet (0.4 mg total) under the tongue every 5 (five) minutes as needed for chest pain (do not exceed 3 pills during one episode). 25 tablet 6  . pantoprazole (PROTONIX) 40 MG tablet TAKE 1 TABLET DAILY 90 tablet 2  . rosuvastatin (CRESTOR) 20 MG tablet Take 1 tablet (20 mg total) by mouth daily. 90 tablet 3  . tamsulosin (FLOMAX) 0.4 MG CAPS capsule Take 1 capsule (0.4 mg total) by mouth daily. 30 capsule 12   No current facility-administered medications on file prior to visit.     Allergies  Allergen Reactions  . Iohexol Anaphylaxis and Other (See Comments)    OK with 13 hour prep  . Pravastatin Nausea Only and Other (See Comments)    Intense headache  . Lisinopril Other (See Comments)    Elevated cr- improved off med  . Codeine Itching  . Iodine-131 Other (See Comments)    Passed out  . Simvastatin Other (See Comments)    Edema  . Statins Other (See Comments)    Sig edema on simvastatin    Past Medical History:  Diagnosis Date  . ALCOHOL ABUSE, HX OF    distant history  . Anxiety    occ panic sx, increased after death of mother  . ANXIETY DEPRESSION 05/10/2007   history of, during difficult relationship  . 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 ESOPHAGEAL STRICTURE 05/10/2007  . GERD 05/10/2007  . Heart disease   . HIATAL HERNIA 05/10/2007  . HYPERLIPIDEMIA 05/10/2007  . HYPERTENSION 05/10/2007  . 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 SLEEP APNEA 05/10/2007   lost 100lb-not now  . Upper airway cough syndrome    Per Dr. Melvyn Novas, pulmonary     Past Surgical History:  Procedure Laterality Date  . APPENDECTOMY    . BACK SURGERY   1997   lower back x3  . CARPOMETACARPAL ([facility]) FUSION OF THUMB Left 04/02/2014   Procedure: CARPOMETACARPAL ([place]) FUSION OF THUMB/LEFT THUMB INTERPHALNGEAL JOINT FUSION;  Surgeon: Jolyn Nap, MD;  Location: [facility];  Service: Orthopedics;  Laterality: Left;  . COLONOSCOPY    .  CORONARY STENT PLACEMENT     2.75 x 12 mm Promus Premier DES was deployed in the mid LAD. The stent was post-dilated with a 3.0 x 9 mm [place] balloon - Mid LAD.  Marland Kitchen LARYNGOSCOPY / BRONCHOSCOPY / ESOPHAGOSCOPY  2012   bx  . LEFT HEART CATHETERIZATION WITH CORONARY ANGIOGRAM N/A 08/25/2012   Procedure: LEFT HEART CATHETERIZATION WITH CORONARY ANGIOGRAM;  Surgeon: Burnell Blanks, MD;  Location: [facility] CATH LAB;  Service: Cardiovascular;  Laterality: N/A;  . LEFT HEART CATHETERIZATION WITH CORONARY ANGIOGRAM N/A 11/23/2013   Procedure: LEFT HEART CATHETERIZATION WITH CORONARY ANGIOGRAM;  Surgeon: Jettie Booze, MD;  Location: [facility] C[facility] LAB;  Service: Cardiovascular;  Laterality: N/A;  . WRIST FRACTURE SURGERY  08/21/10   Right wrist x4    History  Smoking Status  . Former Smoker  . Quit date: 09/30/2010  Smokeless Tobacco  . 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  History  Alcohol Use  . Yes    Comment: 2-3 drinks a week at most    Family History  Problem Relation Age of Onset  . Brain cancer Father   . Dementia Father     h/o brain tumor  . Heart disease Mother   . Hypertension Mother   . Heart disease Brother   . Hypertension Brother   . Hyperlipidemia Brother   . Colon cancer Neg Hx   . Prostate cancer Neg Hx     Reviw of Systems:  Reviewed in the HPI.  All other systems are negative.  Physical Exam: Blood pressure 132/70, pulse 69, height 5\' 8"  (1.727 m), weight 211 lb 12.8 oz (96.1 kg), SpO2 95 %. General: Well developed, well nourished, in no acute distress.  Head: Normocephalic, atraumatic, sclera  non-icteric, mucus membranes are moist,   Neck: Supple. Carotids are 2 +.  He has a moderate left carotid bruit.  No JVD   Lungs: Clear   Heart: RR, normal S1, S2  Abdomen: Soft, non-tender, non-distended with normal bowel sounds.  Msk:  Strength and tone are normal   Extremities: No clubbing or cyanosis. No edema.  Distal pedal pulses are 2+ and equal .   Pulse is 2 +   Neuro: CN II - XII intact.  Alert and oriented X 3.   Psych:  Normal   ECG: May 26, 2016:  NSR at 93.   Inc. RBBB   Assessment / Plan:   1. CAD -  08/25/12 - 2.75 x 12 mm Promus Premier DES was deployed in the mid LAD. The stent was post-dilated with a 3.0 x 9 mm [place] balloon - Mid LAD.  He now presents with symptoms consistent with unstable angina. He's had progressive shortness of breath and chest pressure for the past 2 weeks and these symptoms worsened this weekend. He is now having some shortness of breath and chest discomfort at rest. We will add isosorbide 30 mg a day. We've instructed him to take several mitral glycerin to relieve the chest pain and if he has persistent chest pain he should call 911.  Were not able to schedule a cath for tomorrow but will schedule him on March 30 with Dr. Tamala Julian.  We discussed the risks, benefits, and options. He understands and agrees to proceed.  2. Hypertension - BP is well controlled.  Continue meds.   3. Hypothyroidism: followed by his medical doctor .   4. Hyperlipidemia:  Will check fasting blood lipids today with his precath labs.   Will see him in 3 months .   Mertie Moores, MD  05/26/2016 4:00 PM    [facility] [place],  [place], [place]  [address] Pager [phone] Phone: [phone]; Fax: [phone]

## 2016-05-27 LAB — COMPREHENSIVE METABOLIC PANEL
ALK PHOS: 39 IU/L (ref 39–117)
ALT: 17 IU/L (ref 0–44)
AST: 24 IU/L (ref 0–40)
Albumin/Globulin Ratio: 1.7 (ref 1.2–2.2)
Albumin: 4 g/dL (ref 3.6–4.8)
BUN/Creatinine Ratio: 11 (ref 10–24)
BUN: 17 mg/dL (ref 8–27)
Bilirubin Total: 0.3 mg/dL (ref 0.0–1.2)
CO2: 24 mmol/L (ref 18–29)
CREATININE: 1.53 mg/dL — AB (ref 0.76–1.27)
Calcium: 9.3 mg/dL (ref 8.6–10.2)
Chloride: 104 mmol/L (ref 96–106)
GFR calc Af Amer: 56 mL/min/{1.73_m2} — ABNORMAL LOW (ref >59)
GFR, EST NON AFRICAN AMERICAN: 48 mL/min/{1.73_m2} — AB (ref >59)
Globulin, Total: 2.3 g/dL (ref 1.5–4.5)
Glucose: 77 mg/dL (ref 65–99)
POTASSIUM: 4.1 mmol/L (ref 3.5–5.2)
Sodium: 144 mmol/L (ref 134–144)
Total Protein: 6.3 g/dL (ref 6.0–8.5)

## 2016-05-27 LAB — CBC
Hematocrit: 39.8 % (ref 37.5–51.0)
Hemoglobin: 14 g/dL (ref 13.0–17.7)
MCH: 31.3 pg (ref 26.6–33.0)
MCHC: 35.2 g/dL (ref 31.5–35.7)
MCV: 89 fL (ref 79–97)
PLATELETS: 315 10*3/uL (ref 150–379)
RBC: 4.47 x10E6/uL (ref 4.14–5.80)
RDW: 13.5 % (ref 12.3–15.4)
WBC: 6 10*3/uL (ref 3.4–10.8)

## 2016-05-27 LAB — LIPID PANEL
CHOL/HDL RATIO: 4.8 ratio (ref 0.0–5.0)
Cholesterol, Total: 174 mg/dL (ref 100–199)
HDL: 36 mg/dL — AB (ref >39)
LDL CALC: 93 mg/dL (ref 0–99)
TRIGLYCERIDES: 226 mg/dL — AB (ref 0–149)
VLDL CHOLESTEROL CAL: 45 mg/dL — AB (ref 5–40)

## 2016-05-27 LAB — PROTIME-INR
INR: 1 (ref 0.8–1.2)
Prothrombin Time: 10.4 s (ref 9.1–12.0)

## 2016-05-27 NOTE — H&P (Signed)
Prior stent in mid PAD Progressive dyspnea. Hypertension Hyperlipidemia

## 2016-05-28 ENCOUNTER — Encounter (HOSPITAL_COMMUNITY)
Admission: RE | Disposition: A | Discharge status: Home / Self Care | Payer: Self-pay | Source: Ambulatory Visit | Attending: Interventional Cardiology

## 2016-05-28 ENCOUNTER — Ambulatory Visit (HOSPITAL_COMMUNITY)
Admission: RE | Admit: 2016-05-28 | Discharge: 2016-05-28 | Disposition: A | Discharge status: Home / Self Care | Payer: Medicare Other | Source: Ambulatory Visit | Attending: Interventional Cardiology | Admitting: Interventional Cardiology

## 2016-05-28 DIAGNOSIS — Z8249 Family history of ischemic heart disease and other diseases of the circulatory system: Secondary | ICD-10-CM | POA: Diagnosis not present

## 2016-05-28 DIAGNOSIS — F341 Dysthymic disorder: Secondary | ICD-10-CM | POA: Diagnosis present

## 2016-05-28 DIAGNOSIS — H906 Mixed conductive and sensorineural hearing loss, bilateral: Secondary | ICD-10-CM | POA: Insufficient documentation

## 2016-05-28 DIAGNOSIS — E785 Hyperlipidemia, unspecified: Secondary | ICD-10-CM | POA: Insufficient documentation

## 2016-05-28 DIAGNOSIS — R0789 Other chest pain: Secondary | ICD-10-CM | POA: Insufficient documentation

## 2016-05-28 DIAGNOSIS — I2510 Atherosclerotic heart disease of native coronary artery without angina pectoris: Secondary | ICD-10-CM | POA: Insufficient documentation

## 2016-05-28 DIAGNOSIS — F1021 Alcohol dependence, in remission: Secondary | ICD-10-CM

## 2016-05-28 DIAGNOSIS — I25110 Atherosclerotic heart disease of native coronary artery with unstable angina pectoris: Secondary | ICD-10-CM | POA: Diagnosis present

## 2016-05-28 DIAGNOSIS — K449 Diaphragmatic hernia without obstruction or gangrene: Secondary | ICD-10-CM | POA: Diagnosis not present

## 2016-05-28 DIAGNOSIS — E039 Hypothyroidism, unspecified: Secondary | ICD-10-CM | POA: Insufficient documentation

## 2016-05-28 DIAGNOSIS — Z955 Presence of coronary angioplasty implant and graft: Secondary | ICD-10-CM | POA: Insufficient documentation

## 2016-05-28 DIAGNOSIS — Z7902 Long term (current) use of antithrombotics/antiplatelets: Secondary | ICD-10-CM | POA: Insufficient documentation

## 2016-05-28 DIAGNOSIS — F418 Other specified anxiety disorders: Secondary | ICD-10-CM | POA: Insufficient documentation

## 2016-05-28 DIAGNOSIS — G4730 Sleep apnea, unspecified: Secondary | ICD-10-CM | POA: Diagnosis not present

## 2016-05-28 DIAGNOSIS — Z7982 Long term (current) use of aspirin: Secondary | ICD-10-CM | POA: Diagnosis not present

## 2016-05-28 DIAGNOSIS — K219 Gastro-esophageal reflux disease without esophagitis: Secondary | ICD-10-CM | POA: Diagnosis not present

## 2016-05-28 DIAGNOSIS — I10 Essential (primary) hypertension: Secondary | ICD-10-CM | POA: Diagnosis not present

## 2016-05-28 DIAGNOSIS — I714 Abdominal aortic aneurysm, without rupture, unspecified: Secondary | ICD-10-CM | POA: Diagnosis present

## 2016-05-28 DIAGNOSIS — Z87891 Personal history of nicotine dependence: Secondary | ICD-10-CM | POA: Insufficient documentation

## 2016-05-28 HISTORY — PX: LEFT HEART CATH AND CORONARY ANGIOGRAPHY: CATH118249

## 2016-05-28 LAB — BASIC METABOLIC PANEL
Anion gap: 8 (ref 5–15)
BUN: 16 mg/dL (ref 6–20)
CO2: 24 mmol/L (ref 22–32)
CREATININE: 1.62 mg/dL — AB (ref 0.61–1.24)
Calcium: 9.2 mg/dL (ref 8.9–10.3)
Chloride: 108 mmol/L (ref 101–111)
GFR calc Af Amer: 51 mL/min — ABNORMAL LOW (ref >60)
GFR calc non Af Amer: 44 mL/min — ABNORMAL LOW (ref >60)
GLUCOSE: 108 mg/dL — AB (ref 65–99)
Potassium: 3.8 mmol/L (ref 3.5–5.1)
SODIUM: 140 mmol/L (ref 135–145)

## 2016-05-28 SURGERY — LEFT HEART CATH AND CORONARY ANGIOGRAPHY
Anesthesia: LOCAL

## 2016-05-28 MED ORDER — ASPIRIN 81 MG PO CHEW: 81.0000 mg | CHEWABLE_TABLET | Freq: Every day | ORAL | Status: DC

## 2016-05-28 MED ORDER — SODIUM CHLORIDE 0.9% FLUSH: 3.0000 mL | INTRAVENOUS | Status: DC | PRN

## 2016-05-28 MED ORDER — VERAPAMIL HCL 2.5 MG/ML IV SOLN
INTRAVENOUS | Status: AC
  Filled 2016-05-28: qty 2

## 2016-05-28 MED ORDER — SODIUM CHLORIDE 0.9 % IV SOLN: INTRAVENOUS | Status: DC

## 2016-05-28 MED ORDER — HEPARIN SODIUM (PORCINE) 1000 UNIT/ML IJ SOLN
INTRAMUSCULAR | Status: AC
  Filled 2016-05-28: qty 1

## 2016-05-28 MED ORDER — DIPHENHYDRAMINE HCL 50 MG/ML IJ SOLN
25.0000 mg | Freq: Once | INTRAMUSCULAR | Status: AC
  Administered 2016-05-28: 25 mg via INTRAVENOUS

## 2016-05-28 MED ORDER — FENTANYL CITRATE (PF) 100 MCG/2ML IJ SOLN
INTRAMUSCULAR | Status: AC
  Filled 2016-05-28: qty 2

## 2016-05-28 MED ORDER — SODIUM CHLORIDE 0.9% FLUSH: 3.0000 mL | Freq: Two times a day (BID) | INTRAVENOUS | Status: DC

## 2016-05-28 MED ORDER — HEPARIN SODIUM (PORCINE) 1000 UNIT/ML IJ SOLN
INTRAMUSCULAR | Status: DC | PRN
  Administered 2016-05-28: 5000 [IU] via INTRAVENOUS

## 2016-05-28 MED ORDER — HEPARIN (PORCINE) IN NACL 2-0.9 UNIT/ML-% IJ SOLN
INTRAMUSCULAR | Status: AC
  Filled 2016-05-28: qty 500

## 2016-05-28 MED ORDER — MIDAZOLAM HCL 2 MG/2ML IJ SOLN
INTRAMUSCULAR | Status: DC | PRN
Start: 2016-05-28 — End: 2016-05-28
  Administered 2016-05-28: 1 mg via INTRAVENOUS

## 2016-05-28 MED ORDER — ASPIRIN 81 MG PO CHEW: 81.0000 mg | CHEWABLE_TABLET | ORAL | Status: DC

## 2016-05-28 MED ORDER — FAMOTIDINE IN NACL 20-0.9 MG/50ML-% IV SOLN
INTRAVENOUS | Status: AC
  Administered 2016-05-28: 20 mg via INTRAVENOUS
  Filled 2016-05-28: qty 50

## 2016-05-28 MED ORDER — ACETAMINOPHEN 325 MG PO TABS: 650.0000 mg | ORAL_TABLET | ORAL | Status: DC | PRN

## 2016-05-28 MED ORDER — ONDANSETRON HCL 4 MG/2ML IJ SOLN: 4.0000 mg | Freq: Four times a day (QID) | INTRAMUSCULAR | Status: DC | PRN

## 2016-05-28 MED ORDER — SODIUM CHLORIDE 0.9 % WEIGHT BASED INFUSION
3.0000 mL/kg/h | INTRAVENOUS | Status: AC
  Administered 2016-05-28: 3 mL/kg/h via INTRAVENOUS

## 2016-05-28 MED ORDER — VERAPAMIL HCL 2.5 MG/ML IV SOLN
INTRAVENOUS | Status: DC | PRN
  Administered 2016-05-28: 10 mL via INTRA_ARTERIAL

## 2016-05-28 MED ORDER — SODIUM CHLORIDE 0.9 % IV SOLN: 250.0000 mL | INTRAVENOUS | Status: DC | PRN

## 2016-05-28 MED ORDER — LIDOCAINE HCL (PF) 1 % IJ SOLN
INTRAMUSCULAR | Status: AC
  Filled 2016-05-28: qty 30

## 2016-05-28 MED ORDER — IOPAMIDOL (ISOVUE-370) INJECTION 76%
INTRAVENOUS | Status: AC
  Filled 2016-05-28: qty 100

## 2016-05-28 MED ORDER — FAMOTIDINE IN NACL 20-0.9 MG/50ML-% IV SOLN
20.0000 mg | Freq: Once | INTRAVENOUS | Status: AC
  Administered 2016-05-28: 20 mg via INTRAVENOUS

## 2016-05-28 MED ORDER — METHYLPREDNISOLONE SODIUM SUCC 125 MG IJ SOLR
125.0000 mg | Freq: Once | INTRAMUSCULAR | Status: AC
  Administered 2016-05-28: 125 mg via INTRAVENOUS

## 2016-05-28 MED ORDER — IOPAMIDOL (ISOVUE-370) INJECTION 76%
INTRAVENOUS | Status: DC | PRN
  Administered 2016-05-28: 75 mL via INTRA_ARTERIAL

## 2016-05-28 MED ORDER — METHYLPREDNISOLONE SODIUM SUCC 125 MG IJ SOLR
INTRAMUSCULAR | Status: AC
  Administered 2016-05-28: 125 mg via INTRAVENOUS
  Filled 2016-05-28: qty 2

## 2016-05-28 MED ORDER — LIDOCAINE HCL (PF) 1 % IJ SOLN
INTRAMUSCULAR | Status: DC | PRN
  Administered 2016-05-28: 2 mL

## 2016-05-28 MED ORDER — FENTANYL CITRATE (PF) 100 MCG/2ML IJ SOLN
INTRAMUSCULAR | Status: DC | PRN
  Administered 2016-05-28: 50 ug via INTRAVENOUS

## 2016-05-28 MED ORDER — HEPARIN (PORCINE) IN NACL 2-0.9 UNIT/ML-% IJ SOLN
INTRAMUSCULAR | Status: DC | PRN
Start: 2016-05-28 — End: 2016-05-28
  Administered 2016-05-28: 1000 mL

## 2016-05-28 MED ORDER — DIPHENHYDRAMINE HCL 50 MG/ML IJ SOLN
INTRAMUSCULAR | Status: AC
Start: 2016-05-28 — End: 2016-05-28
  Administered 2016-05-28: 25 mg via INTRAVENOUS
  Filled 2016-05-28: qty 1

## 2016-05-28 MED ORDER — MIDAZOLAM HCL 2 MG/2ML IJ SOLN
INTRAMUSCULAR | Status: AC
  Filled 2016-05-28: qty 2

## 2016-05-28 MED ORDER — SODIUM CHLORIDE 0.9 % WEIGHT BASED INFUSION: 1.0000 mL/kg/h | INTRAVENOUS | Status: DC

## 2016-05-28 SURGICAL SUPPLY — 12 items
CATH INFINITI 5 FR JL3.5 (CATHETERS) ×2 IMPLANT
CATH INFINITI JR4 5F (CATHETERS) ×2 IMPLANT
COVER PRB 48X5XTLSCP FOLD TPE (BAG) ×2 IMPLANT
COVER PROBE 5X48 (BAG) ×4
DEVICE RAD COMP TR BAND LRG (VASCULAR PRODUCTS) ×2 IMPLANT
GLIDESHEATH SLEND A-KIT 6F 22G (SHEATH) ×2 IMPLANT
GUIDEWIRE INQWIRE 1.5J.035X260 (WIRE) ×1 IMPLANT
INQWIRE 1.5J .035X260CM (WIRE) ×2
KIT HEART LEFT (KITS) ×2 IMPLANT
PACK CARDIAC CATHETERIZATION (CUSTOM PROCEDURE TRAY) ×2 IMPLANT
TRANSDUCER W/STOPCOCK (MISCELLANEOUS) ×2 IMPLANT
TUBING CIL FLEX 10 FLL-RA (TUBING) ×2 IMPLANT

## 2016-05-28 NOTE — Discharge Instructions (Signed)
Radial Site Care °Refer to this sheet in the next few weeks. These instructions provide you with information about caring for yourself after your procedure. Your health care provider may also give you more specific instructions. Your treatment has been planned according to current medical practices, but problems sometimes occur. Call your health care provider if you have any problems or questions after your procedure. °What can I expect after the procedure? °After your procedure, it is typical to have the following: °· Bruising at the radial site that usually fades within 1-2 weeks. °· Blood collecting in the tissue (hematoma) that may be painful to the touch. It should usually decrease in size and tenderness within 1-2 weeks. °Follow these instructions at home: °· Take medicines only as directed by your health care provider. °· You may shower 24-48 hours after the procedure or as directed by your health care provider. Remove the bandage (dressing) and gently wash the site with plain soap and water. Pat the area dry with a clean towel. Do not rub the site, because this may cause bleeding. °· Do not take baths, swim, or use a hot tub until your health care provider approves. °· Check your insertion site every day for redness, swelling, or drainage. °· Do not apply powder or lotion to the site. °· Do not flex or bend the affected arm for 24 hours or as directed by your health care provider. °· Do not push or pull heavy objects with the affected arm for 24 hours or as directed by your health care provider. °· Do not lift over 10 lb (4.5 kg) for 5 days after your procedure or as directed by your health care provider. °· Ask your health care provider when it is okay to: °¨ Return to work or school. °¨ Resume usual physical activities or sports. °¨ Resume sexual activity. °· Do not drive home if you are discharged the same day as the procedure. Have someone else drive you. °· You may drive 24 hours after the procedure  unless otherwise instructed by your health care provider. °· Do not operate machinery or power tools for 24 hours after the procedure. °· If your procedure was done as an outpatient procedure, which means that you went home the same day as your procedure, a responsible adult should be with you for the first 24 hours after you arrive home. °· Keep all follow-up visits as directed by your health care provider. This is important. °Contact a health care provider if: °· You have a fever. °· You have chills. °· You have increased bleeding from the radial site. Hold pressure on the site. CALL 911 °Get help right away if: °· You have unusual pain at the radial site. °· You have redness, warmth, or swelling at the radial site. °· You have drainage (other than a small amount of blood on the dressing) from the radial site. °· The radial site is bleeding, and the bleeding does not stop after 30 minutes of holding steady pressure on the site. °· Your arm or hand becomes pale, cool, tingly, or numb. °This information is not intended to replace advice given to you by your health care provider. Make sure you discuss any questions you have with your health care provider. °Document Released: 03/20/2010 Document Revised: 07/24/2015 Document Reviewed: 09/03/2013 °Elsevier Interactive Patient Education © 2017 [employer] ° ° °

## 2016-05-28 NOTE — H&P (View-Only) (Signed)
Tony Hunt Date of Birth  1955-03-20       [name] [address], [address], [place], [place]  [address]   [place], [place]  [address] [phone]     [phone]   Fax  [phone]    Fax [phone]  Problem List: 1. CAD -  08/25/12 - 2.75 x 12 mm Promus Premier DES was deployed in the mid LAD. The stent was post-dilated with a 3.0 x 9 mm [place] balloon - Mid LAD. 2. Hypertension 3. Hypothyroidism 4. hyperlipidemia  Previous Notes :   Tony Hunt is a 61 yo with hx of progressive CP.  These pains have progressed over the past several weeks.  Initially he had only one or 2 times per week. These pains have progressed and he now has several episodes of chest pain each day.  The pains are associated with some dyspnea and lightheadness.   They are not associated with exercise, eating, drinking, change of position. The pains seem to last about 1 minute.  He has not found anything that specifically relieves the pain. They tend to resolve spontaneously.  He had a particularly bad episode of chest discomfort early this week and went to the emergency room. His workup there was unremarkable.  He's had progressive angina. He had an episode of chest pain the office today. The there were no associated EKG changes.  August 30, 2012:  Tony Hunt was seen last week with UAP.  He had a stent placed in his mid left anterior descending artery. He was discharged the next day but started having severe headaches and night sweats. Initially he thought it may be due to the Plavix but ultimately he decided that it seemed to be more related to the Pravachol.  He's feeling much better at this time.  Sept. 23, 2014:  He had several weeks of intermittant CP ( lasted 2-3 minutes) after getting his stent.  Now , these have resolved.  He is back doing all of his normal activities without CP.  He joined the [employer] last week.  He has been 1 time.   He has noticed  some easy bleeding.    11/22/2013:  He has been having more angina CP - typically occurs at rest.  Lasts a few seconds.  Occasionally longer.  Relieved with NTG Walking lots. 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   He does not [facility] to the [facility] as much as he should .   Dec. 18, 2015: And 1 was having some chest pain when I last done in September. Cardiac admission did not reveal any significant stenosis. History of PCI.  Has been doing better. Not exercising as much.  No trouble deer hunting this past fall.   August 27, 2014:  Doing well.  Hunts and fishes regularly . No angina   Jan. 17, 2017:  Tony Hunt is seen for follow up of his CAD' No significant episodes of CP  Still active.    Oct. 16, 2017:  Tony Hunt is seen today for follow up of his CAD. 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  May 26, 2016:  Tony Hunt has had marked dyspnea at rest and with exertion.  No fever, no cough Chest tightness / pain .   radiates through to back .  Last 5-10 minutes, less if he takes SL NTG .  Similar to angina  prior to stenting in 2014  Has been going on for 2 weeks.   Got worse this weekend Took 3 SL NTG.      Current Outpatient Prescriptions on File Prior to Visit  Medication Sig Dispense Refill  . aspirin EC 81 MG tablet Take 1 tablet (81 mg total) by mouth daily. 90 tablet 3  . ciprofloxacin (CIPRO) 500 MG tablet Take 1 tablet (500 mg total) by mouth 2 (two) times daily. 20 tablet 0  . clopidogrel (PLAVIX) 75 MG tablet Take 1 tablet (75 mg total) by mouth daily with breakfast. 90 tablet 2  . fenofibrate 160 MG tablet Take 1 tablet (160 mg total) by mouth daily. 90 tablet 2  . fish oil-omega-3 fatty acids 1000 MG capsule Take 1 g by mouth daily.    Marland Kitchen levothyroxine (SYNTHROID, LEVOTHROID) 100 MCG tablet Take 1 tablet (100 mcg total) by mouth daily before breakfast. 90 tablet 3  . metoprolol tartrate (LOPRESSOR) 25 MG tablet Take 0.5  tablets (12.5 mg total) by mouth 2 (two) times daily. 90 tablet 3  . metroNIDAZOLE (FLAGYL) 500 MG tablet Take 1 tablet (500 mg total) by mouth 3 (three) times daily. 30 tablet 0  . nitroGLYCERIN (NITROSTAT) 0.4 MG SL tablet Place 1 tablet (0.4 mg total) under the tongue every 5 (five) minutes as needed for chest pain (do not exceed 3 pills during one episode). 25 tablet 6  . pantoprazole (PROTONIX) 40 MG tablet TAKE 1 TABLET DAILY 90 tablet 2  . rosuvastatin (CRESTOR) 20 MG tablet Take 1 tablet (20 mg total) by mouth daily. 90 tablet 3  . tamsulosin (FLOMAX) 0.4 MG CAPS capsule Take 1 capsule (0.4 mg total) by mouth daily. 30 capsule 12   No current facility-administered medications on file prior to visit.     Allergies  Allergen Reactions  . Iohexol Anaphylaxis and Other (See Comments)    OK with 13 hour prep  . Pravastatin Nausea Only and Other (See Comments)    Intense headache  . Lisinopril Other (See Comments)    Elevated cr- improved off med  . Codeine Itching  . Iodine-131 Other (See Comments)    Passed out  . Simvastatin Other (See Comments)    Edema  . Statins Other (See Comments)    Sig edema on simvastatin    Past Medical History:  Diagnosis Date  . ALCOHOL ABUSE, HX OF    distant history  . Anxiety    occ panic sx, increased after death of mother  . ANXIETY DEPRESSION 05/10/2007   history of, during difficult relationship  . 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 ESOPHAGEAL STRICTURE 05/10/2007  . GERD 05/10/2007  . Heart disease   . HIATAL HERNIA 05/10/2007  . HYPERLIPIDEMIA 05/10/2007  . HYPERTENSION 05/10/2007  . 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 SLEEP APNEA 05/10/2007   lost 100lb-not now  . Upper airway cough syndrome    Per Dr. Melvyn Novas, pulmonary     Past Surgical History:  Procedure Laterality Date  . APPENDECTOMY    . BACK SURGERY   1997   lower back x3  . CARPOMETACARPAL ([facility]) FUSION OF THUMB Left 04/02/2014   Procedure: CARPOMETACARPAL ([place]) FUSION OF THUMB/LEFT THUMB INTERPHALNGEAL JOINT FUSION;  Surgeon: Jolyn Nap, MD;  Location: [facility];  Service: Orthopedics;  Laterality: Left;  . COLONOSCOPY    .  CORONARY STENT PLACEMENT     2.75 x 12 mm Promus Premier DES was deployed in the mid LAD. The stent was post-dilated with a 3.0 x 9 mm [place] balloon - Mid LAD.  Marland Kitchen LARYNGOSCOPY / BRONCHOSCOPY / ESOPHAGOSCOPY  2012   bx  . LEFT HEART CATHETERIZATION WITH CORONARY ANGIOGRAM N/A 08/25/2012   Procedure: LEFT HEART CATHETERIZATION WITH CORONARY ANGIOGRAM;  Surgeon: Burnell Blanks, MD;  Location: [facility] CATH LAB;  Service: Cardiovascular;  Laterality: N/A;  . LEFT HEART CATHETERIZATION WITH CORONARY ANGIOGRAM N/A 11/23/2013   Procedure: LEFT HEART CATHETERIZATION WITH CORONARY ANGIOGRAM;  Surgeon: Jettie Booze, MD;  Location: [facility] CATH [facility];  Service: Cardiovascular;  Laterality: N/A;  . WRIST FRACTURE SURGERY  08/21/10   Right wrist x4    History  Smoking Status  . Former Smoker  . Quit date: 09/30/2010  Smokeless Tobacco  . 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  History  Alcohol Use  . Yes    Comment: 2-3 drinks a week at most    Family History  Problem Relation Age of Onset  . Brain cancer Father   . Dementia Father     h/o brain tumor  . Heart disease Mother   . Hypertension Mother   . Heart disease Brother   . Hypertension Brother   . Hyperlipidemia Brother   . Colon cancer Neg Hx   . Prostate cancer Neg Hx     Reviw of Systems:  Reviewed in the HPI.  All other systems are negative.  Physical Exam: Blood pressure 132/70, pulse 69, height 5\' 8"  (1.727 m), weight 211 lb 12.8 oz (96.1 kg), SpO2 95 %. General: Well developed, well nourished, in no acute distress.  Head: Normocephalic, atraumatic, sclera  non-icteric, mucus membranes are moist,   Neck: Supple. Carotids are 2 +.  He has a moderate left carotid bruit.  No JVD   Lungs: Clear   Heart: RR, normal S1, S2  Abdomen: Soft, non-tender, non-distended with normal bowel sounds.  Msk:  Strength and tone are normal   Extremities: No clubbing or cyanosis. No edema.  Distal pedal pulses are 2+ and equal .   Pulse is 2 +   Neuro: CN II - XII intact.  Alert and oriented X 3.   Psych:  Normal   ECG: May 26, 2016:  NSR at 75.   Inc. RBBB   Assessment / Plan:   1. CAD -  08/25/12 - 2.75 x 12 mm Promus Premier DES was deployed in the mid LAD. The stent was post-dilated with a 3.0 x 9 mm  balloon - Mid LAD.  He now presents with symptoms consistent with unstable angina. He's had progressive shortness of breath and chest pressure for the past 2 weeks and these symptoms worsened this weekend. He is now having some shortness of breath and chest discomfort at rest. We will add isosorbide 30 mg a day. We've instructed him to take several mitral glycerin to relieve the chest pain and if he has persistent chest pain he should call 911.  Were not able to schedule a cath for tomorrow but will schedule him on March 30 with Dr. Tamala Julian.  We discussed the risks, benefits, and options. He understands and agrees to proceed.  2. Hypertension - BP is well controlled.  Continue meds.   3. Hypothyroidism: followed by his medical doctor .   4. Hyperlipidemia:  Will check fasting blood lipids today with his precath labs.   Will see him in 3 months .   Mertie Moores, MD  05/26/2016 4:00 PM    [place] [place],  [place], [place]  [address] Pager [phone] Phone: [phone]; Fax: [phone]

## 2016-05-28 NOTE — Interval H&P Note (Signed)
History and Physical Interval Note:  05/28/2016 Cath Lab Visit (complete for each Cath Lab visit)  Clinical Evaluation Leading to the Procedure:   ACS: No.  Non-ACS:    Anginal Classification: CCS III  Anti-ischemic medical therapy: Minimal Therapy (1 class of medications)  Non-Invasive Test Results: No non-invasive testing performed  Prior CABG: No previous CABG       10:49 AM  Tony Hunt  has presented today for surgery, with the diagnosis of cad with unstable angina  The various methods of treatment have been discussed with the patient and family. After consideration of risks, benefits and other options for treatment, the patient has consented to  Procedure(s): Left Heart Cath and Coronary Angiography (N/A) as a surgical intervention .  The patient's history has been reviewed, patient examined, no change in status, stable for surgery.  I have reviewed the patient's chart and labs.  Questions were answered to the patient's satisfaction.     Belva Crome III

## 2016-05-31 ENCOUNTER — Encounter (HOSPITAL_COMMUNITY): Payer: Self-pay | Admitting: Interventional Cardiology

## 2016-06-14 ENCOUNTER — Other Ambulatory Visit: Payer: Self-pay

## 2016-06-14 MED ORDER — TAMSULOSIN HCL 0.4 MG PO CAPS: 0.4000 mg | ORAL_CAPSULE | Freq: Every day | ORAL | 2 refills | Status: DC

## 2016-06-14 NOTE — Telephone Encounter (Signed)
[facility]; pt gets free with mail order. Done per protocol.

## 2016-06-18 ENCOUNTER — Ambulatory Visit: Payer: Medicare Other | Admitting: Cardiovascular Disease

## 2016-06-22 ENCOUNTER — Telehealth: Payer: Self-pay | Admitting: Cardiovascular Disease

## 2016-06-22 NOTE — Telephone Encounter (Signed)
Walk In pt Form-paperwork for driving. Placed in doc box

## 2016-06-24 ENCOUNTER — Ambulatory Visit: Payer: Medicare Other | Admitting: Cardiovascular Disease

## 2016-06-29 ENCOUNTER — Ambulatory Visit (HOSPITAL_COMMUNITY)
Admission: RE | Admit: 2016-06-29 | Discharge: 2016-06-29 | Disposition: A | Discharge status: Home / Self Care | Payer: Medicare Other | Source: Ambulatory Visit | Attending: Internal Medicine | Admitting: Internal Medicine

## 2016-06-29 DIAGNOSIS — I25110 Atherosclerotic heart disease of native coronary artery with unstable angina pectoris: Secondary | ICD-10-CM

## 2016-06-29 DIAGNOSIS — R0989 Other specified symptoms and signs involving the circulatory and respiratory systems: Secondary | ICD-10-CM | POA: Insufficient documentation

## 2016-06-29 DIAGNOSIS — I6523 Occlusion and stenosis of bilateral carotid arteries: Secondary | ICD-10-CM | POA: Diagnosis not present

## 2016-07-01 DIAGNOSIS — Z6833 Body mass index (BMI) 33.0-33.9, adult: Secondary | ICD-10-CM | POA: Diagnosis not present

## 2016-07-01 DIAGNOSIS — R809 Proteinuria, unspecified: Secondary | ICD-10-CM | POA: Diagnosis not present

## 2016-07-01 DIAGNOSIS — I129 Hypertensive chronic kidney disease with stage 1 through stage 4 chronic kidney disease, or unspecified chronic kidney disease: Secondary | ICD-10-CM | POA: Diagnosis not present

## 2016-07-01 DIAGNOSIS — N051 Unspecified nephritic syndrome with focal and segmental glomerular lesions: Secondary | ICD-10-CM | POA: Diagnosis not present

## 2016-07-13 DIAGNOSIS — L570 Actinic keratosis: Secondary | ICD-10-CM | POA: Diagnosis not present

## 2016-07-13 DIAGNOSIS — B078 Other viral warts: Secondary | ICD-10-CM | POA: Diagnosis not present

## 2016-08-05 ENCOUNTER — Encounter: Payer: Self-pay | Admitting: Cardiovascular Disease

## 2016-08-05 ENCOUNTER — Ambulatory Visit (INDEPENDENT_AMBULATORY_CARE_PROVIDER_SITE_OTHER): Payer: Medicare Other | Admitting: Cardiovascular Disease

## 2016-08-05 VITALS — BP 128/70 | HR 66 | Ht 68.0 in | Wt 203.5 lb

## 2016-08-05 DIAGNOSIS — I2510 Atherosclerotic heart disease of native coronary artery without angina pectoris: Secondary | ICD-10-CM | POA: Diagnosis not present

## 2016-08-05 DIAGNOSIS — R0602 Shortness of breath: Secondary | ICD-10-CM

## 2016-08-05 DIAGNOSIS — I10 Essential (primary) hypertension: Secondary | ICD-10-CM

## 2016-08-05 DIAGNOSIS — I200 Unstable angina: Secondary | ICD-10-CM

## 2016-08-05 NOTE — Patient Instructions (Signed)
Medication Instructions:  None  Labwork: None  Testing/Procedures: Your physician has requested that you have an echocardiogram. Echocardiography is a painless test that uses sound waves to create images of your heart. It provides your doctor with information about the size and shape of your heart and how well your heart's chambers and valves are working. This procedure takes approximately one hour. There are no restrictions for this procedure.   Follow-Up: Your physician wants you to follow-up in: 6 months with Dr. Acie Fredrickson.  You will receive a reminder letter in the mail two months in advance. If you don't receive a letter, please call our office to schedule the follow-up appointment.   Any Other Special Instructions Will Be Listed Below (If Applicable).     If you need a refill on your cardiac medications before your next appointment, please call your pharmacy.

## 2016-08-05 NOTE — Progress Notes (Signed)
Tony Hunt Date of Birth  1955-03-20       [name] [address], [address], [place], [place]  [address]   [place], [place]  [address] [phone]     [phone]   Fax  [phone]    Fax [phone]  Problem List: 1. CAD -  08/25/12 - 2.75 x 12 mm Promus Premier DES was deployed in the mid LAD. The stent was post-dilated with a 3.0 x 9 mm [place] balloon - Mid LAD. 2. Hypertension 3. Hypothyroidism 4. hyperlipidemia  Previous Notes :   Tony Hunt is a 61 yo with hx of progressive CP.  These pains have progressed over the past several weeks.  Initially he had only one or 2 times per week. These pains have progressed and he now has several episodes of chest pain each day.  The pains are associated with some dyspnea and lightheadness.   They are not associated with exercise, eating, drinking, change of position. The pains seem to last about 1 minute.  He has not found anything that specifically relieves the pain. They tend to resolve spontaneously.  He had a particularly bad episode of chest discomfort early this week and went to the emergency room. His workup there was unremarkable.  He's had progressive angina. He had an episode of chest pain the office today. The there were no associated EKG changes.  August 30, 2012:  Tony Hunt was seen last week with [facility].  He had a stent placed in his mid left anterior descending artery. He was discharged the next day but started having severe headaches and night sweats. Initially he thought it may be due to the Plavix but ultimately he decided that it seemed to be more related to the Pravachol.  He's feeling much better at this time.  Sept. 23, 2014:  He had several weeks of intermittant CP ( lasted 2-3 minutes) after getting his stent.  Now , these have resolved.  He is back doing all of his normal activities without CP.  He joined the [employer] last week.  He has been 1 time.   He has noticed  some easy bleeding.    11/22/2013:  He has been having more angina CP - typically occurs at rest.  Lasts a few seconds.  Occasionally longer.  Relieved with NTG Walking lots. 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   He does not go to the [facility] as much as he should .   Dec. 18, 2015: And 1 was having some chest pain when I last done in September. Cardiac admission did not reveal any significant stenosis. History of PCI.  Has been doing better. Not exercising as much.  No trouble deer hunting this past fall.   August 27, 2014:  Doing well.  Hunts and fishes regularly . No angina   Jan. 17, 2017:  Tony Hunt is seen for follow up of his CAD' No significant episodes of CP  Still active.    Oct. 16, 2017:  Tony Hunt is seen today for follow up of his CAD. 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  May 26, 2016:  Tony Hunt has had marked dyspnea at rest and with exertion.  No fever, no cough Chest tightness / pain .   radiates through to back .  Last 5-10 minutes, less if he takes SL NTG .  Similar to angina  prior to stenting in 2014  Has been going on for 2 weeks.   Got worse this weekend Took 3 SL NTG.    August 05, 2016:  Seen for follow up of his CAD, hypertension and hyperlipidemia. Still having some dyspnea and DOE Cath showed no significant CAD Former smoker , quit 7 years ago       Current Outpatient Prescriptions on File Prior to Visit  Medication Sig Dispense Refill  . acetaminophen (TYLENOL) 500 MG tablet Take 500-1,000 mg by mouth daily as needed for moderate pain or headache.    Marland Kitchen aspirin EC 81 MG tablet Take 1 tablet (81 mg total) by mouth daily. 90 tablet 3  . Chlorpheniramine Maleate (ALLERGY PO) Take 1 tablet by mouth daily as needed (allergies).    . clopidogrel (PLAVIX) 75 MG tablet Take 1 tablet (75 mg total) by mouth daily with breakfast. 90 tablet 2  . fenofibrate 160 MG tablet Take 1 tablet (160 mg total)  by mouth daily. 90 tablet 2  . fish oil-omega-3 fatty acids 1000 MG capsule Take 1 g by mouth daily.    . isosorbide mononitrate (IMDUR) 30 MG 24 hr tablet Take 1 tablet (30 mg total) by mouth daily. 30 tablet 11  . levothyroxine (SYNTHROID, LEVOTHROID) 100 MCG tablet Take 1 tablet (100 mcg total) by mouth daily before breakfast. 90 tablet 3  . Melatonin 10 MG TABS Take 10 mg by mouth at bedtime.    . metoprolol tartrate (LOPRESSOR) 25 MG tablet Take 0.5 tablets (12.5 mg total) by mouth 2 (two) times daily. 90 tablet 3  . nitroGLYCERIN (NITROSTAT) 0.4 MG SL tablet Place 1 tablet (0.4 mg total) under the tongue every 5 (five) minutes as needed for chest pain (do not exceed 3 pills during one episode). 25 tablet 6  . pantoprazole (PROTONIX) 40 MG tablet TAKE 1 TABLET DAILY 90 tablet 2  . rosuvastatin (CRESTOR) 20 MG tablet Take 1 tablet (20 mg total) by mouth daily. 90 tablet 3  . tamsulosin (FLOMAX) 0.4 MG CAPS capsule Take 1 capsule (0.4 mg total) by mouth daily. 90 capsule 2   No current facility-administered medications on file prior to visit.     Allergies  Allergen Reactions  . Iohexol Anaphylaxis and Other (See Comments)    OK with 13 hour prep  . Pravastatin Nausea Only and Other (See Comments)    Intense headache  . Lisinopril Other (See Comments)    Elevated cr- improved off med  . Codeine Itching  . Iodine-131 Other (See Comments)    Passed out  . Simvastatin Other (See Comments)    Edema  . Statins Other (See Comments)    Sig edema on simvastatin    Past Medical History:  Diagnosis Date  . ALCOHOL ABUSE, HX OF    distant history  . Anxiety    occ panic sx, increased after death of mother  . ANXIETY DEPRESSION 05/10/2007   history of, during difficult relationship  . 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 ESOPHAGEAL STRICTURE 05/10/2007  . GERD 05/10/2007    . Heart disease   . HIATAL HERNIA 05/10/2007  . HYPERLIPIDEMIA 05/10/2007  . HYPERTENSION 05/10/2007  . 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 SLEEP APNEA 05/10/2007   lost 100lb-not now  . Upper airway cough syndrome    Per Dr. Melvyn Novas, pulmonary  Past Surgical History:  Procedure Laterality Date  . APPENDECTOMY    . BACK SURGERY  1997   lower back x3  . CARPOMETACARPAL ([facility]) FUSION OF THUMB Left 04/02/2014   Procedure: CARPOMETACARPAL ([place]) FUSION OF THUMB/LEFT THUMB INTERPHALNGEAL JOINT FUSION;  Surgeon: Jolyn Nap, MD;  Location: [facility];  Service: Orthopedics;  Laterality: Left;  . COLONOSCOPY    . CORONARY STENT PLACEMENT     2.75 x 12 mm Promus Premier DES was deployed in the mid LAD. The stent was post-dilated with a 3.0 x 9 mm [place] balloon - Mid LAD.  Marland Kitchen LARYNGOSCOPY / BRONCHOSCOPY / ESOPHAGOSCOPY  2012   bx  . LEFT HEART CATH AND CORONARY ANGIOGRAPHY N/A 05/28/2016   Procedure: Left Heart Cath and Coronary Angiography;  Surgeon: Belva Crome, MD;  Location: [facility] CV LAB;  Service: Cardiovascular;  Laterality: N/A;  . LEFT HEART CATHETERIZATION WITH CORONARY ANGIOGRAM N/A 08/25/2012   Procedure: LEFT HEART CATHETERIZATION WITH CORONARY ANGIOGRAM;  Surgeon: Burnell Blanks, MD;  Location: [facility] CATH LAB;  Service: Cardiovascular;  Laterality: N/A;  . LEFT HEART CATHETERIZATION WITH CORONARY ANGIOGRAM N/A 11/23/2013   Procedure: LEFT HEART CATHETERIZATION WITH CORONARY ANGIOGRAM;  Surgeon: Jettie Booze, MD;  Location: [facility] CATH LAB;  Service: Cardiovascular;  Laterality: N/A;  . WRIST FRACTURE SURGERY  08/21/10   Right wrist x4    History  Smoking Status  . Former Smoker  . Quit date: 09/30/2010  Smokeless Tobacco  . 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  History  Alcohol Use  . Yes    Comment: 2-3 drinks a week at most    Family History  Problem Relation Age of  Onset  . Brain cancer Father   . Dementia Father        h/o brain tumor  . Heart disease Mother   . Hypertension Mother   . Heart disease Brother   . Hypertension Brother   . Hyperlipidemia Brother   . Colon cancer Neg Hx   . Prostate cancer Neg Hx     Reviw of Systems:  Reviewed in the HPI.  All other systems are negative.  Physical Exam: Blood pressure 128/70, pulse 66, height 5\' 8"  (1.727 m), weight 203 lb 8 oz (92.3 kg), SpO2 97 %. General: Well developed, well nourished, in no acute distress.  Head: Normocephalic, atraumatic, sclera non-icteric, mucus membranes are moist,   Neck: Supple. Carotids are 2 +.  He has a moderate left carotid bruit.  No JVD   Lungs: Clear   Heart: RR, normal S1, S2  Abdomen: Soft, non-tender, non-distended with normal bowel sounds.  Msk:  Strength and tone are normal   Extremities: No clubbing or cyanosis. No edema.  Distal pedal pulses are 2+ and equal .   Pulse is 2 +   Neuro: CN II - XII intact.  Alert and oriented X 3.   Psych:  Normal   ECG: May 26, 2016:  NSR at 28.   Inc. RBBB   Assessment / Plan:   1. CAD -  08/25/12 - 2.75 x 12 mm Promus Premier DES was deployed in the mid LAD. The stent was post-dilated with a 3.0 x 9 mm [place] balloon - Mid LAD.  Previous cath in March showed no worsening of his CAD  He still has shortness of breath which might be due to worsening diastolic dysfunction.  We will get an cardiac gram. I've advised him to watch his salt intake. I've advised him to work on a diet, exercise, and weight loss program.  2. Hypertension - BP is well controlled.  Continue meds.   3. Hypothyroidism: followed by his medical doctor .   4. Hyperlipidemia:  Will check fasting blood lipids today with his precath labs.   Will see him in 6  months .   Mertie Moores, MD  08/05/2016 10:16 AM    [facility],  [place], [place]  [address] Pager [phone] Phone: [phone]; Fax: [phone]

## 2016-08-06 DIAGNOSIS — M47812 Spondylosis without myelopathy or radiculopathy, cervical region: Secondary | ICD-10-CM | POA: Diagnosis not present

## 2016-08-12 DIAGNOSIS — M47812 Spondylosis without myelopathy or radiculopathy, cervical region: Secondary | ICD-10-CM | POA: Diagnosis not present

## 2016-08-16 ENCOUNTER — Ambulatory Visit (HOSPITAL_COMMUNITY): Payer: Medicare Other | Attending: Cardiology

## 2016-08-27 ENCOUNTER — Other Ambulatory Visit: Payer: Self-pay

## 2016-08-27 ENCOUNTER — Ambulatory Visit (HOSPITAL_COMMUNITY): Payer: Medicare Other | Attending: Cardiology

## 2016-08-27 DIAGNOSIS — R0602 Shortness of breath: Secondary | ICD-10-CM | POA: Insufficient documentation

## 2016-10-09 ENCOUNTER — Other Ambulatory Visit: Payer: Self-pay | Admitting: Family Medicine

## 2016-10-14 ENCOUNTER — Other Ambulatory Visit: Payer: Self-pay | Admitting: Family Medicine

## 2016-10-21 ENCOUNTER — Ambulatory Visit (INDEPENDENT_AMBULATORY_CARE_PROVIDER_SITE_OTHER): Payer: Medicare Other

## 2016-10-21 ENCOUNTER — Other Ambulatory Visit: Payer: Self-pay | Admitting: Family Medicine

## 2016-10-21 VITALS — BP 110/80 | HR 62 | Temp 98.4°F | Ht 66.25 in | Wt 202.0 lb

## 2016-10-21 DIAGNOSIS — Z125 Encounter for screening for malignant neoplasm of prostate: Secondary | ICD-10-CM

## 2016-10-21 DIAGNOSIS — Z1159 Encounter for screening for other viral diseases: Secondary | ICD-10-CM

## 2016-10-21 DIAGNOSIS — M109 Gout, unspecified: Secondary | ICD-10-CM

## 2016-10-21 DIAGNOSIS — I10 Essential (primary) hypertension: Secondary | ICD-10-CM

## 2016-10-21 DIAGNOSIS — Z0000 Encounter for general adult medical examination without abnormal findings: Secondary | ICD-10-CM

## 2016-10-21 LAB — COMPREHENSIVE METABOLIC PANEL
ALT: 17 U/L (ref 0–53)
AST: 27 U/L (ref 0–37)
Albumin: 3.8 g/dL (ref 3.5–5.2)
Alkaline Phosphatase: 38 U/L — ABNORMAL LOW (ref 39–117)
BUN: 15 mg/dL (ref 6–23)
CALCIUM: 9.5 mg/dL (ref 8.4–10.5)
CHLORIDE: 105 meq/L (ref 96–112)
CO2: 28 meq/L (ref 19–32)
Creatinine, Ser: 1.54 mg/dL — ABNORMAL HIGH (ref 0.40–1.50)
GFR: 48.84 mL/min — AB (ref >60.00)
Glucose, Bld: 137 mg/dL — ABNORMAL HIGH (ref 70–99)
Potassium: 4.1 mEq/L (ref 3.5–5.1)
Sodium: 140 mEq/L (ref 135–145)
Total Bilirubin: 0.4 mg/dL (ref 0.2–1.2)
Total Protein: 6.4 g/dL (ref 6.0–8.3)

## 2016-10-21 LAB — LIPID PANEL
CHOL/HDL RATIO: 7
CHOLESTEROL: 176 mg/dL (ref 0–200)
HDL: 26.9 mg/dL — AB (ref >39.00)
NONHDL: 148.96
TRIGLYCERIDES: 231 mg/dL — AB (ref 0.0–149.0)
VLDL: 46.2 mg/dL — ABNORMAL HIGH (ref 0.0–40.0)

## 2016-10-21 LAB — HEPATITIS C ANTIBODY: HCV AB: NONREACTIVE

## 2016-10-21 LAB — LDL CHOLESTEROL, DIRECT: Direct LDL: 110 mg/dL

## 2016-10-21 LAB — PSA, MEDICARE: PSA: 2.44 ng/mL (ref 0.10–4.00)

## 2016-10-21 LAB — TSH: TSH: 0.93 u[IU]/mL (ref 0.35–4.50)

## 2016-10-21 LAB — URIC ACID: Uric Acid, Serum: 6.3 mg/dL (ref 4.0–7.8)

## 2016-10-21 NOTE — Progress Notes (Signed)
Subjective:   Tony Hunt is a 61 y.o. male who presents for Medicare Annual/Subsequent preventive examination.  Review of Systems:  N/A Cardiac Risk Factors include: advanced age (>76men, >73 women);male gender;obesity (BMI >30kg/m2);dyslipidemia;hypertension     Objective:    Vitals: BP 110/80 (BP Location: Right Arm, Patient Position: Sitting, Cuff Size: Normal)   Pulse 62   Temp 98.4 F (36.9 C) (Oral)   Ht 5' 6.25" (1.683 m) Comment: no shoes  Wt 202 lb (91.6 kg)   SpO2 96%   BMI 32.36 kg/m   Body mass index is 32.36 kg/m.  Tobacco History  Smoking Status  . Former Smoker  . Quit date: 09/30/2010  Smokeless Tobacco  . 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 ALCOHOL ABUSE, HX OF    distant history  . Anxiety    occ panic sx, increased after death of mother  . ANXIETY DEPRESSION 05/10/2007   history of, during difficult relationship  . 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 ESOPHAGEAL STRICTURE 05/10/2007  . GERD 05/10/2007  . Heart disease   . HIATAL HERNIA 05/10/2007  . HYPERLIPIDEMIA 05/10/2007  . HYPERTENSION 05/10/2007  . 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 SLEEP APNEA 05/10/2007   lost 100lb-not now  . Upper airway cough syndrome    Per Dr. Melvyn Novas, pulmonary    Past Surgical History:  Procedure Laterality Date  . APPENDECTOMY    . BACK SURGERY  1997   lower back x3  . CARPOMETACARPAL ([facility]) FUSION OF THUMB Left 04/02/2014   Procedure: CARPOMETACARPAL ([place]) FUSION OF THUMB/LEFT THUMB INTERPHALNGEAL JOINT FUSION;  Surgeon: Jolyn Nap, MD;  Location: [facility];  Service: Orthopedics;  Laterality: Left;  . COLONOSCOPY    . CORONARY STENT PLACEMENT     2.75 x 12 mm Promus Premier DES was deployed in the mid LAD. The stent was post-dilated with a 3.0 x 9 mm [place] balloon - Mid LAD.    Marland Kitchen LARYNGOSCOPY / BRONCHOSCOPY / ESOPHAGOSCOPY  2012   bx  . LEFT HEART CATH AND CORONARY ANGIOGRAPHY N/A 05/28/2016   Procedure: Left Heart Cath and Coronary Angiography;  Surgeon: Belva Crome, MD;  Location: [facility];  Service: Cardiovascular;  Laterality: N/A;  . LEFT HEART CATHETERIZATION WITH CORONARY ANGIOGRAM N/A 08/25/2012   Procedure: LEFT HEART CATHETERIZATION WITH CORONARY ANGIOGRAM;  Surgeon: Burnell Blanks, MD;  Location: [facility] CATH LAB;  Service: Cardiovascular;  Laterality: N/A;  . LEFT HEART CATHETERIZATION WITH CORONARY ANGIOGRAM N/A 11/23/2013   Procedure: LEFT HEART CATHETERIZATION WITH CORONARY ANGIOGRAM;  Surgeon: Jettie Booze, MD;  Location: [facility] CATH LAB;  Service: Cardiovascular;  Laterality: N/A;  . WRIST FRACTURE SURGERY  08/21/10   Right wrist x4   Family History  Problem Relation Age of Onset  . Brain cancer Father   . Dementia Father        h/o brain tumor  . Heart disease Mother   . Hypertension Mother   . Heart disease Brother   . Hypertension Brother   . Hyperlipidemia Brother   . Colon cancer Neg Hx   . Prostate cancer Neg Hx    History  Sexual Activity  . Sexual activity: Not on file    Outpatient Encounter Prescriptions as of 10/21/2016  Medication Sig  .  acetaminophen (TYLENOL) 500 MG tablet Take 500-1,000 mg by mouth daily as needed for moderate pain or headache.  Marland Kitchen aspirin EC 81 MG tablet Take 1 tablet (81 mg total) by mouth daily.  . Chlorpheniramine Maleate (ALLERGY PO) Take 1 tablet by mouth daily as needed (allergies).  . clopidogrel (PLAVIX) 75 MG tablet Take 1 tablet (75 mg total) by mouth daily with breakfast.  . fenofibrate 160 MG tablet Take 1 tablet (160 mg total) by mouth daily.  . fish oil-omega-3 fatty acids 1000 MG capsule Take 1 g by mouth daily.  Marland Kitchen levothyroxine (SYNTHROID, LEVOTHROID) 100 MCG tablet TAKE 1 TABLET DAILY BEFORE BREAKFAST  . Melatonin 10 MG TABS Take 10 mg by mouth at bedtime.  . metoprolol  tartrate (LOPRESSOR) 25 MG tablet Take 0.5 tablets (12.5 mg total) by mouth 2 (two) times daily.  . nitroGLYCERIN (NITROSTAT) 0.4 MG SL tablet Place 1 tablet (0.4 mg total) under the tongue every 5 (five) minutes as needed for chest pain (do not exceed 3 pills during one episode).  . pantoprazole (PROTONIX) 40 MG tablet TAKE 1 TABLET DAILY  . rosuvastatin (CRESTOR) 20 MG tablet TAKE 1 TABLET DAILY  . tamsulosin (FLOMAX) 0.4 MG CAPS capsule Take 1 capsule (0.4 mg total) by mouth daily.  . isosorbide mononitrate (IMDUR) 30 MG 24 hr tablet Take 1 tablet (30 mg total) by mouth daily.   No facility-administered encounter medications on file as of 10/21/2016.     Activities of Daily Living In your present state of health, do you have any difficulty performing the following activities: 10/21/2016 05/28/2016  Hearing? N N  Vision? N N  Difficulty concentrating or making decisions? N N  Walking or climbing stairs? N N  Dressing or bathing? N N  Doing errands, shopping? N -  Preparing Food and eating ? N -  Using the Toilet? N -  In the past six months, have you accidently leaked urine? N -  Do you have problems with loss of bowel control? N -  Managing your Medications? N -  Managing your Finances? N -  Housekeeping or managing your Housekeeping? N -  Some recent data might be hidden    Patient Care Team: Tonia Ghent, MD as PCP - General (Family Medicine)   Assessment:     Hearing Screening   125Hz  250Hz  500Hz  1000Hz  2000Hz  3000Hz  4000Hz  6000Hz  8000Hz   Right ear:   40 40 40  0    Left ear:   40 40 40  0      Visual Acuity Screening   Right eye Left eye Both eyes  Without correction: 20/25-1 20/30-1 20/25-1  With correction:       Exercise Activities and Dietary recommendations Current Exercise Habits: Home exercise routine, Type of exercise: walking, Time (Minutes): 30, Frequency (Times/Week): 5, Weekly Exercise (Minutes/Week): 150, Intensity: Mild, Exercise limited by: None  identified  Goals    . Increase physical activity          Starting 10/21/16, I will continue to walk 30-40 min 5 days per week.       Fall Risk Fall Risk  10/21/2016 10/28/2015 07/02/2013  Falls in the past year? No No No   Depression Screen PHQ 2/9 Scores 10/21/2016 10/28/2015 07/02/2013  PHQ - 2 Score 0 0 0  PHQ- 9 Score 0 - -    Cognitive Function MMSE - Mini Mental State Exam 10/21/2016  Orientation to time 5  Orientation to Place 5  Registration 3  Attention/ Calculation 0  Recall 2  Recall-comments pt was unable to recall 1 of 3 words  Language- name 2 objects 0  Language- repeat 1  Language- follow 3 step command 3  Language- read & follow direction 0  Write a sentence 0  Copy design 0  Total score 19     PLEASE NOTE: A Mini-Cog screen was completed. Maximum score is 20. A value of 0 denotes this part of [place] MMSE was not completed or the patient failed this part of the Mini-Cog screening.   Mini-Cog Screening Orientation to Time - Max 5 pts Orientation to Place - Max 5 pts Registration - Max 3 pts Recall - Max 3 pts Language Repeat - Max 1 pts Language Follow 3 Step Command - Max 3 pts     Immunization History  Administered Date(s) Administered  . Influenza Whole 01/13/2005  . Influenza, Seasonal, Injecte, Preservative Fre 12/06/2014  . Influenza,inj,Quad PF,6+ Mos 10/28/2015  . Influenza-Unspecified 11/20/2013  . Pneumococcal Polysaccharide-23 07/02/2013  . Td 04/01/2001  . Tdap 03/06/2012  . Zoster 08/01/2014   Screening Tests Health Maintenance  Topic Date Due  . INFLUENZA VACCINE  05/29/2017 (Originally 09/29/2016)  . COLONOSCOPY  09/24/2020  . TETANUS/TDAP  03/06/2022  . Hepatitis C Screening  Completed  . HIV Screening  Completed      Plan:     I have personally reviewed and addressed the Medicare Annual Wellness questionnaire and have noted the following in the patient's chart:  A. Medical and social history B. Use of alcohol, tobacco or  illicit drugs  C. Current medications and supplements D. Functional ability and status E.  Nutritional status F.  Physical activity G. Advance directives H. List of other physicians I.  Hospitalizations, surgeries, and ER visits in previous 12 months J.  [place] to include hearing, vision, cognitive, depression L. Referrals and appointments - none  In addition, I have reviewed and discussed with patient certain preventive protocols, quality metrics, and best practice recommendations. A written personalized care plan for preventive services as well as general preventive health recommendations were provided to patient.  See attached scanned questionnaire for additional information.   Signed,   Lindell Noe, MHA, BS, LPN Health Coach

## 2016-10-21 NOTE — Progress Notes (Signed)
PCP notes:   Health maintenance:  Hep C screening - completed Flu vaccine - addressed  Abnormal screenings:   Hearing - failed Mini-Cog score: 19/20  Patient concerns:   Patient reports chest pains and SOB. Patient had cardiology consult. There are no cardiac concerns at this time. Patient states last episode of chest pain was on 10/19/16. Patients states Nitroglycerin is ineffective. Patient reports sharp pain that radiates upward towards heart. Acute visit scheduled 10/22/16 with PCP.   Nurse concerns:  None  Next PCP appt:   10/22/16 @ 1230  I reviewed health advisor's note, was available for consultation on the day of service listed in this note, and agree with documentation and plan. Elsie Stain, MD.

## 2016-10-21 NOTE — Patient Instructions (Signed)
Tony Hunt , Thank you for taking time to come for your Medicare Wellness Visit. I appreciate your ongoing commitment to your health goals. Please review the following plan we discussed and let me know if I can assist you in the future.   These are the goals we discussed: Goals    . Increase physical activity          Starting 10/21/16, I will continue to walk 30-40 min 5 days per week.        This is a list of the screening recommended for you and due dates:  Health Maintenance  Topic Date Due  . Flu Shot  05/29/2017*  . Colon Cancer Screening  09/24/2020  . Tetanus Vaccine  03/06/2022  .  Hepatitis C: One time screening is recommended by Center for Disease Control  (CDC) for  adults born from [birth year] through 1966.   Completed  . HIV Screening  Completed  *Topic was postponed. The date shown is not the original due date.   Preventive Care for Adults  A healthy lifestyle and preventive care can promote health and wellness. Preventive health guidelines for adults include the following key practices.  . A routine yearly physical is a good way to check with your health care provider about your health and preventive screening. It is a chance to share any concerns and updates on your health and to receive a thorough exam.  . Visit your dentist for a routine exam and preventive care every 6 months. Brush your teeth twice a day and floss once a day. Good oral hygiene prevents tooth decay and gum disease.  . The frequency of eye exams is based on your age, health, family medical history, use  of contact lenses, and other factors. Follow your health care provider's ecommendations for frequency of eye exams.  . Eat a healthy diet. Foods like vegetables, fruits, whole grains, low-fat dairy products, and lean protein foods contain the nutrients you need without too many calories. Decrease your intake of foods high in solid fats, added sugars, and salt. Eat the right amount of calories for you. Get  information about a proper diet from your health care provider, if necessary.  . Regular physical exercise is one of the most important things you can do for your health. Most adults should get at least 150 minutes of moderate-intensity exercise (any activity that increases your heart rate and causes you to sweat) each week. In addition, most adults need muscle-strengthening exercises on 2 or more days a week.  Silver Sneakers may be a benefit available to you. To determine eligibility, you may visit the website: www.silversneakers.com or contact program at (249) 419-1869 Mon-Fri between 8AM-8PM.   . Maintain a healthy weight. The body mass index (BMI) is a screening tool to identify possible weight problems. It provides an estimate of body fat based on height and weight. Your health care provider can find your BMI and can help you achieve or maintain a healthy weight.   For adults 20 years and older: ? A BMI below 18.5 is considered underweight. ? A BMI of 18.5 to 24.9 is normal. ? A BMI of 25 to 29.9 is considered overweight. ? A BMI of 30 and above is considered obese.   . Maintain normal blood lipids and cholesterol levels by exercising and minimizing your intake of saturated fat. Eat a balanced diet with plenty of fruit and vegetables. Blood tests for lipids and cholesterol should begin at age 53 and be repeated  every 5 years. If your lipid or cholesterol levels are high, you are over 50, or you are at high risk for heart disease, you may need your cholesterol levels checked more frequently. Ongoing high lipid and cholesterol levels should be treated with medicines if diet and exercise are not working.  . If you smoke, find out from your health care provider how to quit. If you do not use tobacco, please do not start.  . If you choose to drink alcohol, please do not consume more than 2 drinks per day. One drink is considered to be 12 ounces (355 mL) of beer, 5 ounces (148 mL) of wine, or 1.5  ounces (44 mL) of liquor.  . If you are 68-26 years old, ask your health care provider if you should take aspirin to prevent strokes.  . Use sunscreen. Apply sunscreen liberally and repeatedly throughout the day. You should seek shade when your shadow is shorter than you. Protect yourself by wearing long sleeves, pants, a wide-brimmed hat, and sunglasses year round, whenever you are outdoors.  . Once a month, do a whole body skin exam, using a mirror to look at the skin on your back. Tell your health care provider of new moles, moles that have irregular borders, moles that are larger than a pencil eraser, or moles that have changed in shape or color.

## 2016-10-22 ENCOUNTER — Encounter: Payer: Self-pay | Admitting: Family Medicine

## 2016-10-22 ENCOUNTER — Telehealth: Payer: Self-pay

## 2016-10-22 ENCOUNTER — Ambulatory Visit (INDEPENDENT_AMBULATORY_CARE_PROVIDER_SITE_OTHER): Payer: Medicare Other | Admitting: Family Medicine

## 2016-10-22 ENCOUNTER — Other Ambulatory Visit: Payer: Self-pay | Admitting: *Deleted

## 2016-10-22 VITALS — BP 124/70 | HR 61 | Temp 97.7°F | Wt 200.5 lb

## 2016-10-22 DIAGNOSIS — R739 Hyperglycemia, unspecified: Secondary | ICD-10-CM

## 2016-10-22 DIAGNOSIS — R972 Elevated prostate specific antigen [PSA]: Secondary | ICD-10-CM

## 2016-10-22 DIAGNOSIS — I200 Unstable angina: Secondary | ICD-10-CM

## 2016-10-22 DIAGNOSIS — K219 Gastro-esophageal reflux disease without esophagitis: Secondary | ICD-10-CM

## 2016-10-22 DIAGNOSIS — Z125 Encounter for screening for malignant neoplasm of prostate: Secondary | ICD-10-CM

## 2016-10-22 LAB — HEMOGLOBIN A1C: Hgb A1c MFr Bld: 5.9 % (ref 4.6–6.5)

## 2016-10-22 MED ORDER — ISOSORBIDE MONONITRATE ER 30 MG PO TB24: 60.0000 mg | ORAL_TABLET | Freq: Every day | ORAL | 1 refills | Status: DC

## 2016-10-22 MED ORDER — ISOSORBIDE MONONITRATE ER 30 MG PO TB24: 60.0000 mg | ORAL_TABLET | Freq: Every day | ORAL | Status: DC

## 2016-10-22 NOTE — Progress Notes (Signed)
Cath prev done w/o need for stent placement.  F/u echo with Normal LV systolic function and grade 1 diastolic dysfuncition.   D/w pt.    He still has CP in the meantime, can still get SOB.  Can happen at night, sitting up helped some.  Happens a few times a week.  L sided CP, at rest.  Lasts about 1 minute.  Irregular onset.  No L arm pain.  No vomiting, no nausea.  No fevers.    He isn't SOB until he has an episode.  He can still exert w/o CP or SOB o/w.  He has an abnormal taste in his mouth in the last few months.  NTG helps with the pain.    The episodes are less frequent but just as bad when they happen.  Most likely to happen after supper but not just when laying down.    Labs d/w pt.  He was may not have been fasting.  Sugar was up on labs.  D/w pt about diet, etc.    PSA rise d/w pt- still normal level but higher than previous levels.  No FH prostate cancer.  No LUTS.   Meds, vitals, and allergies reviewed.   ROS: Per HPI unless specifically indicated in ROS section   GEN: nad, alert and oriented HEENT: mucous membranes moist NECK: supple w/o LA CV: rrr. PULM: ctab, no inc wob ABD: soft, +bs EXT: no edema SKIN: no acute rash

## 2016-10-22 NOTE — Telephone Encounter (Signed)
Patient states he has the medication, it was abbreviated and he didn't realize what it was.  Patient asked that I send a new Rx to [facility] because he only has enough for about 2 weeks.  RF sent.

## 2016-10-22 NOTE — Telephone Encounter (Signed)
Pt left /vm; pt was seen earlier and was advised to increase dose of Imdur; pt said to his knowledge he has never taken this med. Pt request cb.[facility].

## 2016-10-22 NOTE — Patient Instructions (Signed)
Possible esophageal spasms.  Use nitro if needed.  Try to increase your imdur in the meantime up to 60mg  day.  If needed we can change the rx at the pharmacy.  Rosaria Ferries will call about your referral. Take care.  Glad to see you.  Go to the lab on the way out.  We'll contact you with your lab report- A1c.  Recheck PSA in about 6 months prior to a visit.

## 2016-10-22 NOTE — Telephone Encounter (Signed)
Please verify with patient. isosorbid mononitrate, same as imdur.  Let me know.   I thought he was on it already- we talked about it at the [place].  Thanks.

## 2016-10-24 DIAGNOSIS — R739 Hyperglycemia, unspecified: Secondary | ICD-10-CM | POA: Insufficient documentation

## 2016-10-24 DIAGNOSIS — R972 Elevated prostate specific antigen [PSA]: Secondary | ICD-10-CM | POA: Insufficient documentation

## 2016-10-24 NOTE — Assessment & Plan Note (Signed)
PSA rise d/w pt- still normal level but higher than previous levels.  No FH prostate cancer.  No LUTS. Recheck PSA in about 6 months prior to a visit.  D/w pt.  He agrees.  DRE deferred with no FH, no LUTS, PSA still <4.

## 2016-10-24 NOTE — Assessment & Plan Note (Signed)
Check A1c, see notes on labs.  

## 2016-10-24 NOTE — Assessment & Plan Note (Signed)
>  25 minutes spent in face to face time with patient, >50% spent in counselling or coordination of care.  Possible esophageal spasms.  Use nitro if needed.  Increase your imdur in the meantime up to 60mg  day.  If needed we can change the rx at the pharmacy.  Refer to GI.  He agrees.   Path/phys d/w pt.

## 2016-10-27 ENCOUNTER — Encounter: Payer: Self-pay | Admitting: Gastroenterology

## 2016-10-28 ENCOUNTER — Ambulatory Visit: Payer: Medicare Other | Admitting: Family Medicine

## 2016-11-09 ENCOUNTER — Telehealth: Payer: Self-pay

## 2016-11-09 NOTE — Telephone Encounter (Signed)
Pt left v/m that isosorbide mono ER is causing pt to have bad headaches and pt said that he cannot continue to take medication. Pt request cb with what to do. Pt last seen 10/22/16. Have not put on list of allergies and adverse reactions until Dr Damita Dunnings advises.

## 2016-11-10 ENCOUNTER — Ambulatory Visit (INDEPENDENT_AMBULATORY_CARE_PROVIDER_SITE_OTHER): Payer: Medicare Other | Admitting: Gastroenterology

## 2016-11-10 ENCOUNTER — Encounter: Payer: Self-pay | Admitting: Gastroenterology

## 2016-11-10 ENCOUNTER — Telehealth: Payer: Self-pay | Admitting: Emergency Medicine

## 2016-11-10 VITALS — BP 120/80 | HR 60 | Ht 68.0 in | Wt 200.0 lb

## 2016-11-10 DIAGNOSIS — R1084 Generalized abdominal pain: Secondary | ICD-10-CM | POA: Diagnosis not present

## 2016-11-10 DIAGNOSIS — R6881 Early satiety: Secondary | ICD-10-CM

## 2016-11-10 DIAGNOSIS — R140 Abdominal distension (gaseous): Secondary | ICD-10-CM

## 2016-11-10 MED ORDER — ISOSORBIDE MONONITRATE ER 30 MG PO TB24: 30.0000 mg | ORAL_TABLET | Freq: Every day | ORAL | Status: DC

## 2016-11-10 NOTE — Telephone Encounter (Signed)
Was he able to tolerate the 30mg  w/o troubles?  If so, would restart/continue 30mg  a day.  The higher dose could cause HA and he was correct to stop it.  Let me know.  Thanks.

## 2016-11-10 NOTE — Progress Notes (Signed)
11/18/2016 [name] [id number] Jul 05, 1955   HISTORY OF PRESENT ILLNESS:  This is a 61 year old male who is known to Dr. Henrene Pastor.  He has not been seen here since 2012 when he had his last colonoscopy. He presents to our office today with complaints of diffuse abdominal bloating that results in diffuse abdominal discomfort, decreased appetite,and early satiety for at least the past 6 months. He says that his abdomen constantly feels extremely bloated makes it very uncomfortable. He says that he feels like there is something in his abdomen that is pushing up into his chest is causing him to have shortness of breath. He says that he seems to get full very quickly with eating just small amounts. He does have history of reflux and had an EGD in 2002 that showed a hiatal hernia, reflux esophagitis, duodenitis, and esophageal stricture. He is on pantoprazole 40 mg daily. He denies any changes in his bowel habits. Denies any rectal bleeding.  Recent TSH and CMP unrevealing.  Saw cardiology and they do not feel that his symptoms are cardiac in origin.  Referred here by PCP, Dr. Damita Dunnings.   Past Medical History:  Diagnosis Date  . ALCOHOL ABUSE, HX OF    distant history  . Anxiety    occ panic sx, increased after death of mother  . ANXIETY DEPRESSION 05/10/2007   history of, during difficult relationship  . 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 ESOPHAGEAL STRICTURE 05/10/2007  . GERD 05/10/2007  . Heart disease   . HIATAL HERNIA 05/10/2007  . HYPERLIPIDEMIA 05/10/2007  . HYPERTENSION 05/10/2007  . 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 SLEEP APNEA 05/10/2007   lost 100lb-not now  . Upper airway cough syndrome    Per Dr. Melvyn Novas, pulmonary    Past Surgical History:  Procedure Laterality Date  . APPENDECTOMY    . BACK SURGERY  1997   lower back x3  . CARPOMETACARPAL ([facility])  FUSION OF THUMB Left 04/02/2014   Procedure: CARPOMETACARPAL ([place]) FUSION OF THUMB/LEFT THUMB INTERPHALNGEAL JOINT FUSION;  Surgeon: Jolyn Nap, MD;  Location: [place];  Service: Orthopedics;  Laterality: Left;  . COLONOSCOPY    . CORONARY STENT PLACEMENT     2.75 x 12 mm Promus Premier DES was deployed in the mid LAD. The stent was post-dilated with a 3.0 x 9 mm Lamar balloon - Mid LAD.  Marland Kitchen LARYNGOSCOPY / BRONCHOSCOPY / ESOPHAGOSCOPY  2012   bx  . LEFT HEART CATH AND CORONARY ANGIOGRAPHY N/A 05/28/2016   Procedure: Left Heart Cath and Coronary Angiography;  Surgeon: Belva Crome, MD;  Location: [place] [facility];  Service: Cardiovascular;  Laterality: N/A;  . LEFT HEART CATHETERIZATION WITH CORONARY ANGIOGRAM N/A 08/25/2012   Procedure: LEFT HEART CATHETERIZATION WITH CORONARY ANGIOGRAM;  Surgeon: Burnell Blanks, MD;  Location[facility] C[facility];  Service: Cardiovascular;  Laterality: N/A;  . LEFT HEART CATHETERIZATION WITH CORONARY ANGIOGRAM N/A 11/23/2013   Procedure: LEFT HEART CATHETERIZATION WITH CORONARY ANGIOGRAM;  Surgeon: Jettie Booze, MD;  Location: [facility];  Service: Cardiovascular;  Laterality: N/A;  . WRIST FRACTURE SURGERY  08/21/10   Right wrist x4    reports that he quit smoking about 6 years ago. He has quit using smokeless tobacco. He reports that he drinks alcohol. He reports that he does not use drugs. family history includes  Brain cancer in his father; Dementia in his father; Heart disease in his brother and mother; Hyperlipidemia in his brother; Hypertension in his brother and mother. Allergies  Allergen Reactions  . Iohexol Anaphylaxis and Other (See Comments)    OK with 13 hour prep  . Pravastatin Nausea Only and Other (See Comments)    Intense headache  . Imdur [Isosorbide Dinitrate] Other (See Comments)    Headache at 60mg  dose, but able to tolerate 30mg  per day  . Lisinopril Other (See Comments)    Elevated cr- improved off med  .  Codeine Itching  . Iodine-131 Other (See Comments)    Passed out  . Simvastatin Other (See Comments)    Edema  . Statins Other (See Comments)    Sig edema on simvastatin      Outpatient Encounter Prescriptions as of 11/10/2016  Medication Sig  . acetaminophen (TYLENOL) 500 MG tablet Take 500-1,000 mg by mouth daily as needed for moderate pain or headache.  Marland Kitchen aspirin EC 81 MG tablet Take 1 tablet (81 mg total) by mouth daily.  . Chlorpheniramine Maleate (ALLERGY PO) Take 1 tablet by mouth daily as needed (allergies).  . clopidogrel (PLAVIX) 75 MG tablet Take 1 tablet (75 mg total) by mouth daily with breakfast.  . fenofibrate 160 MG tablet Take 1 tablet (160 mg total) by mouth daily.  . fish oil-omega-3 fatty acids 1000 MG capsule Take 1 g by mouth daily.  Marland Kitchen levothyroxine (SYNTHROID, LEVOTHROID) 100 MCG tablet TAKE 1 TABLET DAILY BEFORE BREAKFAST  . Melatonin 10 MG TABS Take 10 mg by mouth at bedtime.  . metoprolol tartrate (LOPRESSOR) 25 MG tablet Take 0.5 tablets (12.5 mg total) by mouth 2 (two) times daily.  . nitroGLYCERIN (NITROSTAT) 0.4 MG SL tablet Place 1 tablet (0.4 mg total) under the tongue every 5 (five) minutes as needed for chest pain (do not exceed 3 pills during one episode).  . pantoprazole (PROTONIX) 40 MG tablet TAKE 1 TABLET DAILY  . rosuvastatin (CRESTOR) 20 MG tablet TAKE 1 TABLET DAILY  . tamsulosin (FLOMAX) 0.4 MG CAPS capsule Take 1 capsule (0.4 mg total) by mouth daily.  . [DISCONTINUED] isosorbide mononitrate (IMDUR) 30 MG 24 hr tablet Take 2 tablets (60 mg total) by mouth daily.   No facility-administered encounter medications on file as of 11/10/2016.      REVIEW OF SYSTEMS  : All other systems reviewed and negative except where noted in the History of Present Illness.   PHYSICAL EXAM: BP 120/80   Pulse 60   Ht 5\' 8"  (1.727 m)   Wt 200 lb (90.7 kg)   BMI 30.41 kg/m  General: Well developed white male in no acute distress Head: Normocephalic and  atraumatic Eyes:  Sclerae anicteric, conjunctiva pink. Ears: Normal auditory acuity Lungs: Clear throughout to auscultation; no increased WOB Heart: Regular rate and rhythm Abdomen: Soft, ? Somewhat distended.  BS present.  Mild diffuse TTP. Musculoskeletal: Symmetrical with no gross deformities  Skin: No lesions on visible extremities Extremities: No edema  Neurological: Alert oriented x 4, grossly non-focal Psychological:  Alert and cooperative. Normal mood and affect  ASSESSMENT AND PLAN: *61 year old male with complaints of generalized abdominal pain/discomfort, diffuse abdominal bloating, early satiety, and decreased appetite.  Complains that the bloating feels like it is putting pressure into his chest and giving him SOB.  Has history of reflux and is on pantoprazole 40 mg daily.  Will order CT scan of the abdomen and pelvis without contrast (  had anaphylaxis to IV dye in the past).  Will also schedule an EGD with Dr. Henrene Pastor.  May need to increase PPI to BID.  ? Need for GES.  **The risks, benefits, and alternatives to EGD were discussed with the patient and he consents to proceed.    CC:  Tonia Ghent, MD

## 2016-11-10 NOTE — Telephone Encounter (Signed)
Patient notified as instructed by telephone and verbalized understanding. Patient stated that he was able to tolerate the 30 mg without any troubles. Advised that if he has any other problems to call and let Dr. Damita Dunnings know and he agreed.

## 2016-11-10 NOTE — Telephone Encounter (Signed)
Noted. Thanks.  Added to med list.

## 2016-11-10 NOTE — Telephone Encounter (Signed)
   Tony Hunt 1955-03-10 [id number]  We have scheduled the above named patient for an endoscopy procedure. Our records show that (s)he is on anticoagulation therapy.  Please advise as to whether the patient may come off their therapy of Plavix 5 days prior to their procedure which is scheduled for 01-06-17.  Please route your response to Tinnie Gens, CMA or fax response to [phone].  Sincerely,  Tinnie Gens, [facility]

## 2016-11-10 NOTE — Patient Instructions (Addendum)

## 2016-11-15 NOTE — Telephone Encounter (Signed)
No recent coronary stenting. Pt may hold Plavix for 5 days prior to procedure. He is at low risk for procedure

## 2016-11-15 NOTE — Telephone Encounter (Signed)
Spoke to patient and informed him to stop Plavix 5 days before his procedure on 11-08. He verbalized understanding.

## 2016-11-15 NOTE — Telephone Encounter (Signed)
Forwarded to Tinnie Gens, [place] [facility]

## 2016-11-15 NOTE — Telephone Encounter (Signed)
For MD to determine.

## 2016-11-18 ENCOUNTER — Ambulatory Visit (INDEPENDENT_AMBULATORY_CARE_PROVIDER_SITE_OTHER)
Admission: RE | Admit: 2016-11-18 | Discharge: 2016-11-18 | Disposition: A | Discharge status: Home / Self Care | Payer: Medicare Other | Source: Ambulatory Visit | Attending: Gastroenterology | Admitting: Gastroenterology

## 2016-11-18 ENCOUNTER — Encounter: Payer: Self-pay | Admitting: Gastroenterology

## 2016-11-18 DIAGNOSIS — K5730 Diverticulosis of large intestine without perforation or abscess without bleeding: Secondary | ICD-10-CM | POA: Diagnosis not present

## 2016-11-18 DIAGNOSIS — R1084 Generalized abdominal pain: Secondary | ICD-10-CM | POA: Insufficient documentation

## 2016-11-18 DIAGNOSIS — R140 Abdominal distension (gaseous): Secondary | ICD-10-CM | POA: Insufficient documentation

## 2016-11-18 DIAGNOSIS — R6881 Early satiety: Secondary | ICD-10-CM

## 2016-11-18 DIAGNOSIS — I700 Atherosclerosis of aorta: Secondary | ICD-10-CM | POA: Diagnosis not present

## 2016-11-18 NOTE — Progress Notes (Signed)
Initial assessment and plans reviewed 

## 2016-11-22 ENCOUNTER — Telehealth: Payer: Self-pay

## 2016-11-22 NOTE — Telephone Encounter (Signed)
Pt left v/m; pt has ringing in ears and pt thinks was told that ringing in ears could be military related and if in pts [facility] record that tinnitus is military related needs copy of that visit to take to [place]. Pt request cb.

## 2016-11-24 NOTE — Telephone Encounter (Signed)
I had prev documented under social history.   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  Noted tinnitus, h/o noise exposure that was service related.   Please print this and give to patient.  Thanks.

## 2016-11-26 ENCOUNTER — Encounter: Payer: Self-pay | Admitting: *Deleted

## 2016-11-26 NOTE — Telephone Encounter (Signed)
This is noted in his Social History in EPIC but I am unable to print it.  I did copy and print onto letterhead.  Do you think this will suffice?  Vallarie Mare agreed that she doesn't know any other way of doing it.

## 2016-11-26 NOTE — Telephone Encounter (Signed)
Thanks, I'll sign it next week.

## 2016-12-08 DIAGNOSIS — J029 Acute pharyngitis, unspecified: Secondary | ICD-10-CM | POA: Diagnosis not present

## 2016-12-09 ENCOUNTER — Other Ambulatory Visit: Payer: Self-pay | Admitting: Cardiovascular Disease

## 2016-12-10 DIAGNOSIS — L598 Other specified disorders of the skin and subcutaneous tissue related to radiation: Secondary | ICD-10-CM | POA: Diagnosis not present

## 2016-12-10 DIAGNOSIS — J387 Other diseases of larynx: Secondary | ICD-10-CM | POA: Diagnosis not present

## 2016-12-10 DIAGNOSIS — Z923 Personal history of irradiation: Secondary | ICD-10-CM | POA: Diagnosis not present

## 2016-12-10 DIAGNOSIS — Z8521 Personal history of malignant neoplasm of larynx: Secondary | ICD-10-CM | POA: Diagnosis not present

## 2016-12-10 DIAGNOSIS — J342 Deviated nasal septum: Secondary | ICD-10-CM | POA: Diagnosis not present

## 2016-12-10 DIAGNOSIS — Z87891 Personal history of nicotine dependence: Secondary | ICD-10-CM | POA: Diagnosis not present

## 2016-12-10 DIAGNOSIS — Z7289 Other problems related to lifestyle: Secondary | ICD-10-CM | POA: Diagnosis not present

## 2016-12-21 ENCOUNTER — Other Ambulatory Visit: Payer: Self-pay | Admitting: Cardiovascular Disease

## 2016-12-23 ENCOUNTER — Other Ambulatory Visit: Payer: Self-pay | Admitting: *Deleted

## 2016-12-23 DIAGNOSIS — I6523 Occlusion and stenosis of bilateral carotid arteries: Secondary | ICD-10-CM

## 2016-12-28 DIAGNOSIS — H9313 Tinnitus, bilateral: Secondary | ICD-10-CM | POA: Diagnosis not present

## 2016-12-28 DIAGNOSIS — H903 Sensorineural hearing loss, bilateral: Secondary | ICD-10-CM | POA: Diagnosis not present

## 2016-12-28 DIAGNOSIS — H9113 Presbycusis, bilateral: Secondary | ICD-10-CM | POA: Diagnosis not present

## 2017-01-05 DIAGNOSIS — Z23 Encounter for immunization: Secondary | ICD-10-CM | POA: Diagnosis not present

## 2017-01-06 ENCOUNTER — Ambulatory Visit (AMBULATORY_SURGERY_CENTER): Payer: Medicare Other | Admitting: Internal Medicine

## 2017-01-06 ENCOUNTER — Other Ambulatory Visit: Payer: Self-pay

## 2017-01-06 ENCOUNTER — Encounter: Payer: Self-pay | Admitting: Internal Medicine

## 2017-01-06 VITALS — BP 116/68 | HR 48 | Temp 97.1°F | Resp 14 | Ht 68.0 in | Wt 200.0 lb

## 2017-01-06 DIAGNOSIS — K253 Acute gastric ulcer without hemorrhage or perforation: Secondary | ICD-10-CM

## 2017-01-06 DIAGNOSIS — R1084 Generalized abdominal pain: Secondary | ICD-10-CM

## 2017-01-06 DIAGNOSIS — I2510 Atherosclerotic heart disease of native coronary artery without angina pectoris: Secondary | ICD-10-CM | POA: Diagnosis not present

## 2017-01-06 DIAGNOSIS — I10 Essential (primary) hypertension: Secondary | ICD-10-CM | POA: Diagnosis not present

## 2017-01-06 DIAGNOSIS — R6881 Early satiety: Secondary | ICD-10-CM | POA: Diagnosis not present

## 2017-01-06 DIAGNOSIS — K219 Gastro-esophageal reflux disease without esophagitis: Secondary | ICD-10-CM | POA: Diagnosis not present

## 2017-01-06 DIAGNOSIS — R140 Abdominal distension (gaseous): Secondary | ICD-10-CM | POA: Diagnosis not present

## 2017-01-06 MED ORDER — SODIUM CHLORIDE 0.9 % IV SOLN: 500.0000 mL | INTRAVENOUS | Status: DC

## 2017-01-06 NOTE — Patient Instructions (Signed)
YOU HAD AN ENDOSCOPIC PROCEDURE TODAY AT [facility]:   Refer to the procedure report that was given to you for any specific questions about what was found during the examination.  If the procedure report does not answer your questions, please call your gastroenterologist to clarify.  If you requested that your care partner not be given the details of your procedure findings, then the procedure report has been included in a sealed envelope for you to review at your convenience later.  YOU SHOULD EXPECT: Some feelings of bloating in the abdomen. Passage of more gas than usual.  Walking can help get rid of the air that was put into your GI tract during the procedure and reduce the bloating.  Please Note:  You might notice some irritation and congestion in your nose or some drainage.  This is from the oxygen used during your procedure.  There is no need for concern and it should clear up in a day or so.  SYMPTOMS TO REPORT IMMEDIATELY:   Following upper endoscopy (EGD)  Vomiting of blood or coffee ground material  New chest pain or pain under the shoulder blades  Painful or persistently difficult swallowing  New shortness of breath  Fever of 100F or higher  Black, tarry-looking stools  For urgent or emergent issues, a gastroenterologist can be reached at any hour by calling [phone].   DIET:  We do recommend a small meal at first, but then you may proceed to your regular diet.  Drink plenty of fluids but you should avoid alcoholic beverages for 24 hours.  ACTIVITY:  You should plan to take it easy for the rest of today and you should NOT DRIVE or use heavy machinery until tomorrow (because of the sedation medicines used during the test).    FOLLOW UP: Our staff will call the number listed on your records the next business day following your procedure to check on you and address any questions or concerns that you may have regarding the information given to you following  your procedure. If we do not reach you, we will leave a message.  However, if you are feeling well and you are not experiencing any problems, there is no need to return our call.  We will assume that you have returned to your regular daily activities without incident.  If any biopsies were taken you will be contacted by phone or by letter within the next 1-3 weeks.  Please call us at [phone] if you have not heard about the biopsies in 3 weeks.   SIGNATURES/CONFIDENTIALITY: You and/or your care partner have signed paperwork which will be entered into your electronic medical record.  These signatures attest to the fact that that the information above on your After Visit Summary has been reviewed and is understood.  Full responsibility of the confidentiality of this discharge information lies with you and/or your care-partner.  Restart Plavix today  Continue other medications  Dr. Blanch Media nurse will call you to set up your appointment for the emptying scan

## 2017-01-06 NOTE — Progress Notes (Signed)
Report to PACU, RN, vss, BBS= Clear.  

## 2017-01-06 NOTE — Op Note (Signed)
[facility] Patient Name: Tony Hunt Procedure Date: 01/06/2017 10:12 AM MRN: [id number] Endoscopist: Docia Chuck. Henrene Pastor , MD Age: 61 Referring MD:  Date of Birth: Nov 10, 1955 Gender: Male Account #: [id number] Procedure:                Upper GI endoscopy, With biopsies Indications:              Dyspepsia, Abdominal bloating, Early satiety Medicines:                Monitored Anesthesia Care Procedure:                Pre-Anesthesia Assessment:                           - Prior to the procedure, a History and Physical                            was performed, and patient medications and                            allergies were reviewed. The patient's tolerance of                            previous anesthesia was also reviewed. The risks                            and benefits of the procedure and the sedation                            options and risks were discussed with the patient.                            All questions were answered, and informed consent                            was obtained. Prior Anticoagulants: The patient has                            taken no previous anticoagulant or antiplatelet                            agents. ASA Grade Assessment: III - A patient with                            severe systemic disease. After reviewing the risks                            and benefits, the patient was deemed in                            satisfactory condition to undergo the procedure.                           After obtaining informed consent, the endoscope was  passed under direct vision. Throughout the                            procedure, the patient's blood pressure, pulse, and                            oxygen saturations were monitored continuously. The                            Model GIF-[id number]) scope was introduced                            through the mouth, and advanced to the second part       of duodenum. The upper GI endoscopy was                            accomplished without difficulty. The patient                            tolerated the procedure well. Scope In: Scope Out: Findings:                 The esophagus was normal.                           A few small erosions were found in the gastric                            antrum. Biopsies were taken with a cold forceps for                            Helicobacter pylori testing using CLOtest.                           The stomach was Otherwise normal.                           The examined duodenum was normal.                           The cardia and gastric fundus were normal on                            retroflexion. Complications:            No immediate complications. Estimated Blood Loss:     Estimated blood loss: none. Impression:               - Normal esophagus.                           - Mild Erosive gastropathy. Biopsied.                           - Normal stomach.                           -  Normal examined duodenum. Recommendation:           - Patient has a contact number available for                            emergencies. The signs and symptoms of potential                            delayed complications were discussed with the                            patient. Return to normal activities tomorrow.                            Written discharge instructions were provided to the                            patient.                           - Resume previous diet.                           - Continue present medications.                           - Await pathology results.                           - Schedule a solid-phase gastric emptying scan                            "rule out gastroparesis". We will contact to with                            the results after the exam has been completed. Docia Chuck. Henrene Pastor, MD 01/06/2017 10:32:23 AM This report has been signed electronically.

## 2017-01-06 NOTE — Progress Notes (Signed)
Verbal order from Dr. Henrene Pastor- ok for pt to restart Plavix today

## 2017-01-06 NOTE — Progress Notes (Signed)
Called to room to assist during endoscopic procedure.  Patient ID and intended procedure confirmed with present staff. Received instructions for my participation in the procedure from the performing physician.  

## 2017-01-07 ENCOUNTER — Telehealth: Payer: Self-pay

## 2017-01-07 LAB — HELICOBACTER PYLORI SCREEN-BIOPSY: UREASE: NEGATIVE

## 2017-01-07 NOTE — Telephone Encounter (Signed)
  Follow up Call-  Call Tony Hunt number 01/06/2017  Post procedure Call Tony Hunt phone  # [phone]  Permission to leave phone message Yes  Some recent data might be hidden     Patient questions:  Do you have a fever, pain , or abdominal swelling? No. Pain Score  0 *  Have you tolerated food without any problems? Yes.    Have you been able to return to your normal activities? Yes.    Do you have any questions about your discharge instructions: Diet   No. Medications  No. Follow up visit  No.  Do you have questions or concerns about your Care? No.  Actions: * If pain score is 4 or above: No action needed, pain <4.

## 2017-01-08 ENCOUNTER — Other Ambulatory Visit: Payer: Self-pay | Admitting: Cardiovascular Disease

## 2017-01-08 ENCOUNTER — Other Ambulatory Visit: Payer: Self-pay | Admitting: Family Medicine

## 2017-01-10 ENCOUNTER — Other Ambulatory Visit: Payer: Self-pay

## 2017-01-10 DIAGNOSIS — R140 Abdominal distension (gaseous): Secondary | ICD-10-CM

## 2017-01-10 DIAGNOSIS — K219 Gastro-esophageal reflux disease without esophagitis: Secondary | ICD-10-CM

## 2017-01-10 DIAGNOSIS — R6881 Early satiety: Secondary | ICD-10-CM

## 2017-01-10 NOTE — Telephone Encounter (Signed)
Electronic refill Last refill 10/11/16 #90 Last office visit 10/22/16 See allergy/contraindication

## 2017-01-11 ENCOUNTER — Other Ambulatory Visit: Payer: Self-pay | Admitting: Family Medicine

## 2017-01-11 DIAGNOSIS — M47812 Spondylosis without myelopathy or radiculopathy, cervical region: Secondary | ICD-10-CM | POA: Diagnosis not present

## 2017-01-11 NOTE — Telephone Encounter (Signed)
Sent. Thanks.   

## 2017-01-12 ENCOUNTER — Telehealth: Payer: Self-pay

## 2017-01-12 NOTE — Telephone Encounter (Signed)
Patient notified and instructed for gastric emptying scan on 01/25/17 arrive at 7:15 am to the [facility]. NPO after midnight.

## 2017-01-25 ENCOUNTER — Ambulatory Visit (HOSPITAL_COMMUNITY)
Admission: RE | Admit: 2017-01-25 | Discharge: 2017-01-25 | Disposition: A | Discharge status: Home / Self Care | Payer: Medicare Other | Source: Ambulatory Visit | Attending: Internal Medicine | Admitting: Internal Medicine

## 2017-01-25 DIAGNOSIS — R140 Abdominal distension (gaseous): Secondary | ICD-10-CM

## 2017-01-25 DIAGNOSIS — K219 Gastro-esophageal reflux disease without esophagitis: Secondary | ICD-10-CM | POA: Diagnosis not present

## 2017-01-25 DIAGNOSIS — R6881 Early satiety: Secondary | ICD-10-CM | POA: Diagnosis not present

## 2017-01-25 MED ORDER — TECHNETIUM TC 99M SULFUR COLLOID
2.0000 | Freq: Once | INTRAVENOUS | Status: AC | PRN
  Administered 2017-01-25: 2 via INTRAVENOUS

## 2017-02-12 ENCOUNTER — Other Ambulatory Visit: Payer: Self-pay | Admitting: Family Medicine

## 2017-03-08 ENCOUNTER — Other Ambulatory Visit: Payer: Self-pay

## 2017-03-08 DIAGNOSIS — I714 Abdominal aortic aneurysm, without rupture, unspecified: Secondary | ICD-10-CM

## 2017-03-16 ENCOUNTER — Ambulatory Visit (INDEPENDENT_AMBULATORY_CARE_PROVIDER_SITE_OTHER): Payer: Medicare Other | Admitting: Cardiovascular Disease

## 2017-03-16 ENCOUNTER — Encounter: Payer: Self-pay | Admitting: Cardiovascular Disease

## 2017-03-16 VITALS — BP 132/62 | HR 53 | Ht 67.0 in | Wt 208.5 lb

## 2017-03-16 DIAGNOSIS — I2510 Atherosclerotic heart disease of native coronary artery without angina pectoris: Secondary | ICD-10-CM | POA: Diagnosis not present

## 2017-03-16 DIAGNOSIS — E782 Mixed hyperlipidemia: Secondary | ICD-10-CM | POA: Diagnosis not present

## 2017-03-16 DIAGNOSIS — I10 Essential (primary) hypertension: Secondary | ICD-10-CM

## 2017-03-16 LAB — HEPATIC FUNCTION PANEL
ALK PHOS: 42 IU/L (ref 39–117)
ALT: 10 IU/L (ref 0–44)
AST: 19 IU/L (ref 0–40)
Albumin: 4 g/dL (ref 3.6–4.8)
BILIRUBIN TOTAL: 0.3 mg/dL (ref 0.0–1.2)
BILIRUBIN, DIRECT: 0.12 mg/dL (ref 0.00–0.40)
Total Protein: 6.3 g/dL (ref 6.0–8.5)

## 2017-03-16 LAB — BASIC METABOLIC PANEL
BUN / CREAT RATIO: 14 (ref 10–24)
BUN: 20 mg/dL (ref 8–27)
CALCIUM: 9.4 mg/dL (ref 8.6–10.2)
CO2: 20 mmol/L (ref 20–29)
CREATININE: 1.46 mg/dL — AB (ref 0.76–1.27)
Chloride: 105 mmol/L (ref 96–106)
GFR calc non Af Amer: 51 mL/min/{1.73_m2} — ABNORMAL LOW (ref >59)
GFR, EST AFRICAN AMERICAN: 59 mL/min/{1.73_m2} — AB (ref >59)
Glucose: 91 mg/dL (ref 65–99)
Potassium: 4.5 mmol/L (ref 3.5–5.2)
Sodium: 139 mmol/L (ref 134–144)

## 2017-03-16 LAB — LIPID PANEL
CHOLESTEROL TOTAL: 195 mg/dL (ref 100–199)
Chol/HDL Ratio: 5.4 ratio — ABNORMAL HIGH (ref 0.0–5.0)
HDL: 36 mg/dL — ABNORMAL LOW (ref >39)
LDL Calculated: 112 mg/dL — ABNORMAL HIGH (ref 0–99)
TRIGLYCERIDES: 235 mg/dL — AB (ref 0–149)
VLDL Cholesterol Cal: 47 mg/dL — ABNORMAL HIGH (ref 5–40)

## 2017-03-16 NOTE — Progress Notes (Signed)
Tony Hunt Date of Birth  1955-03-20       [name] [address], [address], [place], [place]  [address]   [place], [place]  [address] [phone]     [phone]   Fax  [phone]    Fax [phone]  Problem List: 1. CAD -  08/25/12 - 2.75 x 12 mm Promus Premier DES was deployed in the mid LAD. The stent was post-dilated with a 3.0 x 9 mm [place] balloon - Mid LAD. 2. Hypertension 3. Hypothyroidism 4. hyperlipidemia  Previous Notes :   Tony Hunt is a 62 yo with hx of progressive CP.  These pains have progressed over the past several weeks.  Initially he had only one or 2 times per week. These pains have progressed and he now has several episodes of chest pain each day.  The pains are associated with some dyspnea and lightheadness.   They are not associated with exercise, eating, drinking, change of position. The pains seem to last about 1 minute.  He has not found anything that specifically relieves the pain. They tend to resolve spontaneously.  He had a particularly bad episode of chest discomfort early this week and went to the emergency room. His workup there was unremarkable.  He's had progressive angina. He had an episode of chest pain the office today. The there were no associated EKG changes.  August 30, 2012:  Tony Hunt was seen last week with [facility].  He had a stent placed in his mid left anterior descending artery. He was discharged the next day but started having severe headaches and night sweats. Initially he thought it may be due to the Plavix but ultimately he decided that it seemed to be more related to the Pravachol.  He's feeling much better at this time.  Sept. 23, 2014:  He had several weeks of intermittant CP ( lasted 2-3 minutes) after getting his stent.  Now , these have resolved.  He is back doing all of his normal activities without CP.  He joined the [employer] last week.  He has been 1 time.   He has noticed  some easy bleeding.    11/22/2013:  He has been having more angina CP - typically occurs at rest.  Lasts a few seconds.  Occasionally longer.  Relieved with NTG Walking lots. 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   [facility] .   Dec. 18, 2015: And 1 was having some chest pain when I last done in September. Cardiac admission did not reveal any significant stenosis. History of PCI.  Has been doing better. Not exercising as much.  No trouble deer hunting this past fall.   August 27, 2014:  Doing well.  Hunts and fishes regularly . No angina   Jan. 17, 2017:  Tony Hunt is seen for follow up of his CAD' No significant episodes of CP  Still active.    Oct. 16, 2017:  Tony Hunt is seen today for follow up of his CAD. 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  May 26, 2016:  Tony Hunt has had marked dyspnea at rest and with exertion.  No fever, no cough Chest tightness / pain .   radiates through to back .  Last 5-10 minutes, less if he takes SL NTG .  Similar to angina  prior to stenting in 2014  Has been going on for 2 weeks.   Got worse this weekend Took 3 SL NTG.    August 05, 2016:  Seen for follow up of his CAD, hypertension and hyperlipidemia. Still having some dyspnea and DOE Cath showed no significant CAD Former smoker , quit 7 years ago   March 16, 2017:  Tony Hunt is seen today  No CP or dyspnea.   Going to the [facility] several times a week   Hunted a lot this past winter     Current Outpatient Medications on File Prior to Visit  Medication Sig Dispense Refill  . acetaminophen (TYLENOL) 500 MG tablet Take 500-1,000 mg by mouth daily as needed for moderate pain or headache.    Marland Kitchen aspirin EC 81 MG tablet Take 1 tablet (81 mg total) by mouth daily. 90 tablet 3  . Chlorpheniramine Maleate (ALLERGY PO) Take 1 tablet by mouth daily as needed (allergies).    . clopidogrel (PLAVIX) 75 MG tablet  TAKE 1 TABLET DAILY WITH BREAKFAST 90 tablet 2  . fenofibrate 160 MG tablet Take 1 tablet (160 mg total) daily by mouth. 90 tablet 1  . fish oil-omega-3 fatty acids 1000 MG capsule Take 1 g by mouth daily.    Marland Kitchen levothyroxine (SYNTHROID, LEVOTHROID) 100 MCG tablet TAKE 1 TABLET DAILY BEFORE BREAKFAST 90 tablet 3  . Melatonin 10 MG TABS Take 10 mg by mouth at bedtime.    . metoprolol tartrate (LOPRESSOR) 25 MG tablet TAKE ONE-HALF (1/2) TABLET TWICE A DAY 90 tablet 2  . nitroGLYCERIN (NITROSTAT) 0.4 MG SL tablet Place 1 tablet (0.4 mg total) under the tongue every 5 (five) minutes as needed for chest pain (do not exceed 3 pills during one episode). 25 tablet 6  . pantoprazole (PROTONIX) 40 MG tablet TAKE 1 TABLET DAILY 90 tablet 2  . rosuvastatin (CRESTOR) 20 MG tablet TAKE 1 TABLET DAILY 90 tablet 1  . tamsulosin (FLOMAX) 0.4 MG CAPS capsule TAKE 1 CAPSULE DAILY 90 capsule 2   No current facility-administered medications on file prior to visit.     Allergies  Allergen Reactions  . Iohexol Anaphylaxis and Other (See Comments)    OK with 13 hour prep  . Pravastatin Nausea Only and Other (See Comments)    Intense headache  . Imdur [Isosorbide Dinitrate] Other (See Comments)    Headache at 60mg  dose, but able to tolerate 30mg  per day  . Lisinopril Other (See Comments)    Elevated cr- improved off med  . Codeine Itching  . Iodine-131 Other (See Comments)    Passed out  . Simvastatin Other (See Comments)    Edema  . Statins Other (See Comments)    Sig edema on simvastatin    Past Medical History:  Diagnosis Date  . ALCOHOL ABUSE, HX OF    distant history  . Anxiety    occ panic sx, increased after death of mother  . ANXIETY DEPRESSION 05/10/2007   history of, during difficult relationship  . 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 ESOPHAGEAL STRICTURE 05/10/2007  . GERD  05/10/2007  . Heart disease   . HIATAL HERNIA 05/10/2007  . HYPERLIPIDEMIA 05/10/2007  . HYPERTENSION 05/10/2007  . 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 SLEEP APNEA 05/10/2007   lost 100lb-not now  . Upper airway cough syndrome  Per Dr. Melvyn Novas, pulmonary     Past Surgical History:  Procedure Laterality Date  . APPENDECTOMY    . BACK SURGERY  1997   lower back x3  . CARPOMETACARPAL ([facility]) FUSION OF THUMB Left 04/02/2014   Procedure: CARPOMETACARPAL ([place]) FUSION OF THUMB/LEFT THUMB INTERPHALNGEAL JOINT FUSION;  Surgeon: Jolyn Nap, MD;  Location: [facility];  Service: Orthopedics;  Laterality: Left;  . COLONOSCOPY    . CORONARY STENT PLACEMENT     2.75 x 12 mm Promus Premier DES was deployed in the mid LAD. The stent was post-dilated with a 3.0 x 9 mm [place] balloon - Mid LAD.  Marland Kitchen LARYNGOSCOPY / BRONCHOSCOPY / ESOPHAGOSCOPY  2012   bx  . LEFT HEART CATH AND CORONARY ANGIOGRAPHY N/A 05/28/2016   Procedure: Left Heart Cath and Coronary Angiography;  Surgeon: Belva Crome, MD;  Location: [facility];  Service: Cardiovascular;  Laterality: N/A;  . LEFT HEART CATHETERIZATION WITH CORONARY ANGIOGRAM N/A 08/25/2012   Procedure: LEFT HEART CATHETERIZATION WITH CORONARY ANGIOGRAM;  Surgeon: Burnell Blanks, MD;  [facility];  Service: Cardiovascular;  Laterality: N/A;  . LEFT HEART CATHETERIZATION WITH CORONARY ANGIOGRAM N/A 11/23/2013   Procedure: LEFT HEART CATHETERIZATION WITH CORONARY ANGIOGRAM;  Surgeon: Jettie Booze, MD;  Location: [facility] CATH LAB;  Service: Cardiovascular;  Laterality: N/A;  . WRIST FRACTURE SURGERY  08/21/10   Right wrist x4    Social History   Tobacco Use  Smoking Status Former Smoker  . Last attempt to quit: 09/30/2010  . Years since quitting: 6.4  Smokeless Tobacco 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  Social History   Substance and Sexual Activity    Alcohol Use Yes   Comment: 2-3 drinks a week at most    Family History  Problem Relation Age of Onset  . Brain cancer Father   . Dementia Father        h/o brain tumor  . Heart disease Mother   . Hypertension Mother   . Heart disease Brother   . Hypertension Brother   . Hyperlipidemia Brother   . Colon cancer Neg Hx   . Prostate cancer Neg Hx     Reviw of Systems:  Reviewed in the HPI.  All other systems are negative.  Physical Exam: Blood pressure 132/62, pulse (!) 53, height 5\' 7"  (1.702 m), weight 208 lb 8 oz (94.6 kg).  GEN:  Well nourished, well developed in no acute distress HEENT: Normal NECK: No JVD; No carotid bruits LYMPHATICS: No lymphadenopathy CARDIAC: RR, no murmurs, rubs, gallops RESPIRATORY:  Clear to auscultation without rales, wheezing or rhonchi  ABDOMEN: Soft, non-tender, non-distended MUSCULOSKELETAL:  No edema; No deformity  SKIN: Warm and dry NEUROLOGIC:  Alert and oriented x 3    ECG:   March 16, 2017: Sinus bradycardia at a rate of 53.  He has nonspecific ST and T wave changes.    Assessment / Plan:   1. CAD -  08/25/12 - 2.75 x 12 mm Promus Premier DES. No angina .  Doing well.  Continue meds  2. Hypertension -blood pressure remains well controlled.  Continue diet, exercise program.  3. Hypothyroidism: followed by his medical doctor .   4. Hyperlipidemia: We will check fasting labs today.  Continue Crestor and fenofibrate.  Return to see the APP in 6 months.  I will see him again in 1  year.   Mertie Moores, MD  03/16/2017 9:55 AM    [address],  [place], [place]  [address] Pager [phone] Phone: [phone]; Fax: [phone]

## 2017-03-16 NOTE — Patient Instructions (Signed)
Medication Instructions:  Your physician recommends that you continue on your current medications as directed. Please refer to the Current Medication list given to you today.   Labwork: TODAY - cholesterol, liver panel, basic metabolic panel   Testing/Procedures: None Ordered   Follow-Up: Your physician wants you to follow-up in: 6 months with Dr. Elmarie Shiley PA or Nurse Practitioner.  You will receive a reminder letter in the mail two months in advance. If you don't receive a letter, please call our office to schedule the follow-up appointment.    If you need a refill on your cardiac medications before your next appointment, please call your pharmacy.   Thank you for choosing [facility]! Christen Bame, RN [phone]

## 2017-03-28 ENCOUNTER — Ambulatory Visit (HOSPITAL_COMMUNITY)
Admission: RE | Admit: 2017-03-28 | Discharge: 2017-03-28 | Disposition: A | Discharge status: Home / Self Care | Payer: Medicare Other | Source: Ambulatory Visit | Attending: Family | Admitting: Family

## 2017-03-28 ENCOUNTER — Encounter: Payer: Self-pay | Admitting: Family

## 2017-03-28 ENCOUNTER — Ambulatory Visit (INDEPENDENT_AMBULATORY_CARE_PROVIDER_SITE_OTHER): Payer: Medicare Other | Admitting: Family

## 2017-03-28 ENCOUNTER — Other Ambulatory Visit: Payer: Self-pay

## 2017-03-28 VITALS — BP 122/78 | HR 63 | Temp 97.3°F | Resp 18 | Ht 67.0 in | Wt 203.0 lb

## 2017-03-28 DIAGNOSIS — I714 Abdominal aortic aneurysm, without rupture, unspecified: Secondary | ICD-10-CM

## 2017-03-28 DIAGNOSIS — I2510 Atherosclerotic heart disease of native coronary artery without angina pectoris: Secondary | ICD-10-CM

## 2017-03-28 DIAGNOSIS — I723 Aneurysm of iliac artery: Secondary | ICD-10-CM

## 2017-03-28 DIAGNOSIS — Z87891 Personal history of nicotine dependence: Secondary | ICD-10-CM

## 2017-03-28 NOTE — Progress Notes (Signed)
[facility] [place]   CC: Follow up Abdominal Aortic Aneurysm and bilateral common iliac artery aneurysms   History of Present Illness  Tony Hunt is a 62 y.o. ([birth year]) male who is back for follow-up of his small abdominal aortic aneurysm and bilateral common iliac artery aneurysms.  This was initially detected when he underwent a CT scan for abdominal pain which demonstrated diverticulosis.  CT scan showed a maximum diameter of 3 cm.  He continues to be without abdominal pain or back pain.  He denies any neurologic symptoms.  Specifically he denies numbness or weakness in either extremity no slurred speech.  He denies any claudication symptoms.    The patient has a history of laryngeal cancer, status post radiation treatment. He quit smoking in 2012 at the time of this treatment. He has a history of hypercholesterolemia which is managed with a statin. His blood pressure has been well controlled.  He had a heart cath in March 2018 by Dr. Daneen Schick for chest pain, no significant stenosis found per pt, no intervention necessary. He had a cardiac stent placed about 2014, has not needed CABG.  He denies any known hx of stroke or TIA.   Dr. Trula Slade last evaluated pt on 03-31-15. At that time abdominal ultrasound showed a maximum diameter of his aorta of 2.8 cm, maximum iliac diameter was 2.1 cm.  The patient was to follow-up in 2 years for a repeat ultrasound.  Dr. Trula Slade discussed indications for repair would be aortic diameter of 5 cm or iliac diameter of 3 cm. Continue statin.  He takes a daily 81 mg ASA and Plavix.   Diabetic: No Tobacco use: former smoker, quit in 2012, started in his early 20's  Past Medical History:  Diagnosis Date  . ALCOHOL ABUSE, HX OF    distant history  . Anxiety    occ panic sx, increased after death of mother  . ANXIETY DEPRESSION 05/10/2007   history of, during difficult relationship  . Cancer ([place]) 03/2010   tumor on larynx/tx  radiation  . 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 ESOPHAGEAL STRICTURE 05/10/2007  . GERD 05/10/2007  . Heart disease   . HIATAL HERNIA 05/10/2007  . HYPERLIPIDEMIA 05/10/2007  . HYPERTENSION 05/10/2007  . 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 SLEEP APNEA 05/10/2007   lost 100lb-not now  . Upper airway cough syndrome    Per Dr. Melvyn Novas, pulmonary    Past Surgical History:  Procedure Laterality Date  . APPENDECTOMY    . BACK SURGERY  1997   lower back x3  . CARPOMETACARPAL ([facility]) FUSION OF THUMB Left 04/02/2014   Procedure: CARPOMETACARPAL ([place]) FUSION OF THUMB/LEFT THUMB INTERPHALNGEAL JOINT FUSION;  Surgeon: Jolyn Nap, MD;  Location: [facility];  Service: Orthopedics;  Laterality: Left;  . COLONOSCOPY    . CORONARY STENT PLACEMENT     2.75 x 12 mm Promus Premier DES was deployed in the mid LAD. The stent was post-dilated with a 3.0 x 9 mm [place] balloon - Mid LAD.  Marland Kitchen LARYNGOSCOPY / BRONCHOSCOPY / ESOPHAGOSCOPY  2012   bx  . LEFT HEART CATH AND CORONARY ANGIOGRAPHY N/A 05/28/2016   Procedure: Left Heart Cath and Coronary Angiography;  Surgeon: Belva Crome, MD;  Location: [facility];  Service: Cardiovascular;  Laterality: N/A;  . LEFT HEART CATHETERIZATION WITH CORONARY ANGIOGRAM N/A 08/25/2012   Procedure: LEFT HEART CATHETERIZATION WITH  CORONARY ANGIOGRAM;  Surgeon: Burnell Blanks, MD;  [facility]: [facility] CATH LAB;  Service: Cardiovascular;  Laterality: N/A;  . LEFT HEART CATHETERIZATION WITH CORONARY ANGIOGRAM N/A 11/23/2013   Procedure: LEFT HEART CATHETERIZATION WITH CORONARY ANGIOGRAM;  Surgeon: Jettie Booze, MD;  Location: [facility] CATH LAB;  Service: Cardiovascular;  Laterality: N/A;  . WRIST FRACTURE SURGERY  08/21/10   Right wrist x4   Social History Social History   Socioeconomic History  . Marital status: Married    Spouse name: Not on file  . Number of children: 1  . Years of  education: Not on file  . Highest education level: Not on file  Social Needs  . Financial resource strain: Not on file  . Food insecurity - worry: Not on file  . Food insecurity - inability: Not on file  . Transportation needs - medical: Not on file  . Transportation needs - non-medical: Not on file  Occupational History  . Occupation:      Fish farm manager: UNEMPLOYED    Comment: Retired  Tobacco Use  . Smoking status: Former Smoker    Last attempt to quit: 09/30/2010    Years since quitting: 6.4  . Smokeless tobacco: Former Network engineer and Sexual Activity  . Alcohol use: Yes    Comment: 2-3 drinks a week at most  . Drug use: No  . Sexual activity: Not on file  Other Topics Concern  . Not on file  Social History Narrative   Married 8/13. 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  Noted tinnitus, h/o noise exposure that was service related.    Family History Family History  Problem Relation Age of Onset  . Brain cancer Father   . Dementia Father        h/o brain tumor  . Heart disease Mother   . Hypertension Mother   . Heart disease Brother   . Hypertension Brother   . Hyperlipidemia Brother   . Colon cancer Neg Hx   . Prostate cancer Neg Hx     Current Outpatient Medications on File Prior to Visit  Medication Sig Dispense Refill  . acetaminophen (TYLENOL) 500 MG tablet Take 500-1,000 mg by mouth daily as needed for moderate pain or headache.    Marland Kitchen aspirin EC 81 MG tablet Take 1 tablet (81 mg total) by mouth daily. 90 tablet 3  . Chlorpheniramine Maleate (ALLERGY PO) Take 1 tablet by mouth daily as needed (allergies).    . clopidogrel (PLAVIX) 75 MG tablet TAKE 1 TABLET DAILY WITH BREAKFAST 90 tablet 2  . fenofibrate 160 MG tablet Take 1 tablet (160 mg total) daily by mouth. 90 tablet 1  . fish oil-omega-3 fatty acids 1000 MG capsule Take 1 g by mouth daily.    Marland Kitchen levothyroxine (SYNTHROID, LEVOTHROID) 100 MCG tablet TAKE 1  TABLET DAILY BEFORE BREAKFAST 90 tablet 3  . Melatonin 10 MG TABS Take 10 mg by mouth at bedtime.    . metoprolol tartrate (LOPRESSOR) 25 MG tablet TAKE ONE-HALF (1/2) TABLET TWICE A DAY 90 tablet 2  . nitroGLYCERIN (NITROSTAT) 0.4 MG SL tablet Place 1 tablet (0.4 mg total) under the tongue every 5 (five) minutes as needed for chest pain (do not exceed 3 pills during one episode). 25 tablet 6  . pantoprazole (PROTONIX) 40 MG tablet TAKE 1 TABLET DAILY 90 tablet 2  . rosuvastatin (CRESTOR) 20 MG tablet TAKE 1 TABLET DAILY 90  tablet 1  . tamsulosin (FLOMAX) 0.4 MG CAPS capsule TAKE 1 CAPSULE DAILY 90 capsule 2   No current facility-administered medications on file prior to visit.    Allergies  Allergen Reactions  . Iohexol Anaphylaxis and Other (See Comments)    OK with 13 hour prep  . Pravastatin Nausea Only and Other (See Comments)    Intense headache  . Imdur [Isosorbide Dinitrate] Other (See Comments)    Headache at 60mg  dose, but able to tolerate 30mg  per day  . Lisinopril Other (See Comments)    Elevated cr- improved off med  . Codeine Itching  . Iodine-131 Other (See Comments)    Passed out  . Simvastatin Other (See Comments)    Edema  . Statins Other (See Comments)    Sig edema on simvastatin    ROS: See HPI for pertinent positives and negatives.  Physical Examination  Vitals:   03/28/17 0852  BP: 122/78  Pulse: 63  Resp: 18  Temp: (!) 97.3 F (36.3 C)  SpO2: 95%  Weight: 203 lb (92.1 kg)  Height: 5\' 7"  (1.702 m)   Body mass index is 31.79 kg/m.  General: A&O x 3, WD, obese male.  HEENT: Grossly intact and WNL.   Pulmonary: Sym exp, respirations are non labored, good air movement in all fields, CTAB, no rales, rhonchi, or wheezing.  Cardiac: Regular rhythm and rate, no detected murmur.   Carotid Bruits Right Left   Negative Negative   Adominal aortic pulse is not palpable Radial pulses are 2+ bilaterally                          VASCULAR EXAM:                                                                                                          LE Pulses Right Left       FEMORAL  2+ palpable  2+ palpable        POPLITEAL  2+ palpable   1+ palpable       POSTERIOR TIBIAL  1+ palpable   not palpable        DORSALIS PEDIS      ANTERIOR TIBIAL 2+ palpable  2+ palpable      Gastrointestinal: soft, NTND, -G/R, - HSM, - masses palpated, - CVAT B.  Musculoskeletal: M/S 5/5 throughout, Extremities without ischemic changes.  Skin: No rashes, no ulcers, no cellulitis.    Neurologic: CN 2-12 intact, Pain and light touch intact in extremities are intact, Motor exam as listed above.  Psychiatric: Normal thought content, mood appropriate to clinical situation.    DATA  AAA Duplex (03/28/2017):  Previous size: 2.8 cm (Date: 03-31-15); Right CIA: 2.1 cm; Left CIA: 1.8 cm  Current size:  3.0 cm (Date: 03-28-17); Right CIA: 2.4 cm; Left CIA: 2.3 cm  Medical Decision Making  The patient is a 62 y.o. male who presents with asymptomatic AAA with 2 mm increase in size in 2 years: 3.0 cm today, 2.8 cm  on 03-31-15. Bilateral common iliac arteries are slightly aneurysmal: right at 2.4 cm, left at 2.3 cm.    Based on this patient's exam and diagnostic studies, the patient will follow up in 18 months  with the following studies: AAA duplex.  Consideration for repair of AAA would be made when the size is 5.0 cm, growth > 1 cm/yr, and symptomatic status.       Consideration for repair of common iliac artery aneurysm would be made when the size is 3.0 cm, growth > 1 cm/yr, and symptomatic status.         The patient was given information about AAA including signs, symptoms, treatment, and how to minimize the risk of enlargement and rupture of aneurysms.    I emphasized the importance of maximal medical management including strict control of blood pressure, blood glucose, and lipid levels, antiplatelet agents, obtaining regular exercise, and  continued cessation of smoking.   The patient was advised to call 911 should the patient experience sudden onset abdominal or back pain.   Thank you for allowing [place] to participate in this patient's care.  Clemon Chambers, RN, MSN, FNP-C [facility] [place] Office: [facility] Physician: Trula Slade  03/28/2017, 9:09 AM

## 2017-03-28 NOTE — Patient Instructions (Signed)
Abdominal Aortic Aneurysm Blood pumps away from the heart through tubes (blood vessels) called arteries. Aneurysms are weak or damaged places in the wall of an artery. It bulges out like a balloon. An abdominal aortic aneurysm happens in the main artery of the body (aorta). It can burst or tear, causing bleeding inside the body. This is an emergency. It needs treatment right away. What are the causes? The exact cause is unknown. Things that could cause this problem include:  Fat and other substances building up in the lining of a tube.  Swelling of the walls of a blood vessel.  Certain tissue diseases.  Belly (abdominal) trauma.  An infection in the main artery of the body.  What increases the risk? There are things that make it more likely for you to have an aneurysm. These include:  Being over the age of 63 years old.  Having high blood pressure (hypertension).  Being a male.  Being white.  Being very overweight (obese).  Having a family history of aneurysm.  Using tobacco products.  What are the signs or symptoms? Symptoms depend on the size of the aneurysm and how fast it grows. There may not be symptoms. If symptoms occur, they can include:  Pain (belly, side, lower back, or groin).  Feeling full after eating a small amount of food.  Feeling sick to your stomach (nauseous), throwing up (vomiting), or both.  Feeling a lump in your belly that feels like it is beating (pulsating).  Feeling like you will pass out (faint).  How is this treated?  Medicine to control blood pressure and pain.  Imaging tests to see if the aneurysm gets bigger.  Surgery. How is this prevented? To lessen your chance of getting this condition:  Stop smoking. Stop chewing tobacco.  Limit or avoid alcohol.  Keep your blood pressure, blood sugar, and cholesterol within normal limits.  Eat less salt.  Eat foods low in saturated fats and cholesterol. These are found in animal and  whole dairy products.  Eat more fiber. Fiber is found in whole grains, vegetables, and fruits.  Keep a healthy weight.  Stay active and exercise often.  This information is not intended to replace advice given to you by your health care provider. Make sure you discuss any questions you have with your health care provider. Document Released: 06/12/2012 Document Revised: 07/24/2015 Document Reviewed: 03/17/2012 Elsevier Interactive Patient Education  2017 Elsevier Inc.  

## 2017-03-30 ENCOUNTER — Encounter: Payer: Self-pay | Admitting: Family Medicine

## 2017-03-30 ENCOUNTER — Ambulatory Visit
Admission: RE | Admit: 2017-03-30 | Discharge: 2017-03-30 | Disposition: A | Discharge status: Home / Self Care | Payer: Medicare Other | Source: Ambulatory Visit | Attending: Family Medicine | Admitting: Family Medicine

## 2017-03-30 ENCOUNTER — Ambulatory Visit (INDEPENDENT_AMBULATORY_CARE_PROVIDER_SITE_OTHER): Payer: Medicare Other | Admitting: Family Medicine

## 2017-03-30 DIAGNOSIS — R51 Headache: Secondary | ICD-10-CM | POA: Insufficient documentation

## 2017-03-30 DIAGNOSIS — I2510 Atherosclerotic heart disease of native coronary artery without angina pectoris: Secondary | ICD-10-CM

## 2017-03-30 DIAGNOSIS — R519 Headache, unspecified: Secondary | ICD-10-CM

## 2017-03-30 MED ORDER — ONDANSETRON HCL 4 MG PO TABS: 4.0000 mg | ORAL_TABLET | Freq: Three times a day (TID) | ORAL | 0 refills | Status: DC | PRN

## 2017-03-30 MED ORDER — CYCLOBENZAPRINE HCL 10 MG PO TABS: 5.0000 mg | ORAL_TABLET | Freq: Three times a day (TID) | ORAL | 0 refills | Status: DC | PRN

## 2017-03-30 NOTE — Progress Notes (Signed)
He has been getting injections in his neck for arthritis.  Now he is getting more HA.  The muscles in his neck are going into spasms.  Sig pain, and vomiting from pain.  HA worse in the last few weeks.  Photophobia and phonophobia noted.  Tylenol isn't helping.    Sometimes the injections help with pain but didn't help much with the last round.  Per Dr. Mina Marble at [facility].  Requesting records.    He didn't have HA like this until the neck pain got worse.  Laying down in a dark room helps some with the HA.  No weakness, no focal neuro changes but diffusely tired (and "tired of pain").  Pain is occipital and on the B temples.  Sometimes the pain is worse than others.  No caffeine.    He does have aura prior to the HA starting, seeing stars, etc.    D/w pt I didn't think the cholesterol med was contributing- he has been on it much longer than the HA have been going on.   Meds, vitals, and allergies reviewed.   ROS: Per HPI unless specifically indicated in ROS section   GEN: nad, alert and oriented HEENT: mucous membranes moist, TM wnl, PERRL, sinuses not ttp, OP wnl NECK: supple w/o LA but traps tight and sore B CV: rrr. PULM: ctab, no inc wob ABD: soft, +bs EXT: no edema CN 2-12 wnl B, S/S/DTR wnl x4

## 2017-03-30 NOTE — Patient Instructions (Addendum)
Likely migraine triggered by neck pain.  CT head- see [facility] Hawking on the way out.  Try cyclobenzaprine for the muscle spasms and headaches.   Sedation caution.  Try ondansetron if needed for nausea.  Update me in a few days, sooner if needed.  Take care.  Glad to see you.

## 2017-03-31 DIAGNOSIS — G43109 Migraine with aura, not intractable, without status migrainosus: Secondary | ICD-10-CM | POA: Insufficient documentation

## 2017-03-31 NOTE — Assessment & Plan Note (Signed)
Likely migraine with aura, presumed to be triggered by neck pain, would still check head CT.  Done, called and talked to patient, neg.  Use flexeril as needed for HA with sedation caution.  Try ondansetron if needed for nausea.  Update me in a few days, sooner if needed.  Migraine path/phys d/w pt.  >25 minutes spent in face to face time with patient, >50% spent in counselling or coordination of care.

## 2017-04-10 ENCOUNTER — Telehealth: Payer: Self-pay | Admitting: Family Medicine

## 2017-04-10 NOTE — Telephone Encounter (Signed)
Please get update on patient regarding headaches.  Thanks.

## 2017-04-11 NOTE — Telephone Encounter (Signed)
Patient states the pills have really helped his headaches.  He has mostly just taken one half tablet, occasionally a whole tablet.  Rx has no RF, does he just call when he needs RF?

## 2017-04-11 NOTE — Telephone Encounter (Signed)
Yes, please. If flexeril needed frequently, ie more than 2-3 times per week, then needs OV to discuss prophylactic med options.  Thanks.   Glad he is better.

## 2017-04-12 NOTE — Telephone Encounter (Signed)
Left detailed message on voicemail.  

## 2017-04-15 ENCOUNTER — Encounter: Payer: Self-pay | Admitting: Family Medicine

## 2017-04-15 ENCOUNTER — Ambulatory Visit (INDEPENDENT_AMBULATORY_CARE_PROVIDER_SITE_OTHER): Payer: Medicare Other | Admitting: Family Medicine

## 2017-04-15 DIAGNOSIS — R972 Elevated prostate specific antigen [PSA]: Secondary | ICD-10-CM

## 2017-04-15 DIAGNOSIS — I2510 Atherosclerotic heart disease of native coronary artery without angina pectoris: Secondary | ICD-10-CM | POA: Diagnosis not present

## 2017-04-15 DIAGNOSIS — Z125 Encounter for screening for malignant neoplasm of prostate: Secondary | ICD-10-CM

## 2017-04-15 DIAGNOSIS — G43109 Migraine with aura, not intractable, without status migrainosus: Secondary | ICD-10-CM | POA: Diagnosis not present

## 2017-04-15 LAB — PSA: PSA: 1.18 ng/mL (ref 0.10–4.00)

## 2017-04-15 MED ORDER — GABAPENTIN 100 MG PO CAPS: 100.0000 mg | ORAL_CAPSULE | Freq: Two times a day (BID) | ORAL | 3 refills | Status: DC | PRN

## 2017-04-15 NOTE — Patient Instructions (Signed)
Try 100 mg gabapentin at night.  Gradually go up to 3 tabs twice a day if needed.  Update me about the headaches and how you are doing in general.  Still okay to use flexeril if needed for a headache.

## 2017-04-15 NOTE — Progress Notes (Signed)
Headache follow up.  He does have aura prior to the HA starting, seeing stars, etc.  prev note d/w pt.  Improved on flexeril. Both patient and I think his neck pain is causing his headaches and he'll f/u with ortho about his neck pain.  No ADE on flexeril except for minimal fatigue on med.    PSA rise d/w pt- prev normal level but higher than previous levels.  No FH prostate cancer.  No LUTS. Repeat PSA pending.   Meds, vitals, and allergies reviewed.   ROS: Per HPI unless specifically indicated in ROS section   GEN: nad, alert and oriented HEENT: mucous membranes moist NECK: supple w/o LA, but ttp in the bilateral paraspinal cervical muscles, not tender in the midline. CV: rrr. PULM: ctab, no inc wob ABD: soft, +bs EXT: no edema CN 2-12 wnl B, S/S/DTR wnl x4

## 2017-04-17 NOTE — Assessment & Plan Note (Signed)
See notes on labs.  Recheck PSA is back to baseline, around 1.

## 2017-04-17 NOTE — Assessment & Plan Note (Signed)
Likely that his neck is contributing to his migraine symptoms.  He will follow-up with Ortho about his neck pain.  Discussed with patient about preventative versus abortive treatment.  Start 100 mg gabapentin at night.  Gradually go up to 3 tabs twice a day if needed.  He'll update me about the headaches and how he is doing in general.  Still okay to use flexeril if needed for a headache.   All questions answered.  He agrees.

## 2017-04-18 ENCOUNTER — Encounter: Payer: Self-pay | Admitting: *Deleted

## 2017-04-22 DIAGNOSIS — M47812 Spondylosis without myelopathy or radiculopathy, cervical region: Secondary | ICD-10-CM | POA: Diagnosis not present

## 2017-04-27 ENCOUNTER — Other Ambulatory Visit: Payer: Medicare Other

## 2017-05-10 DIAGNOSIS — R51 Headache: Secondary | ICD-10-CM | POA: Diagnosis not present

## 2017-05-10 DIAGNOSIS — Z79899 Other long term (current) drug therapy: Secondary | ICD-10-CM | POA: Diagnosis not present

## 2017-05-12 DIAGNOSIS — G518 Other disorders of facial nerve: Secondary | ICD-10-CM | POA: Diagnosis not present

## 2017-05-12 DIAGNOSIS — R51 Headache: Secondary | ICD-10-CM | POA: Diagnosis not present

## 2017-05-12 DIAGNOSIS — M542 Cervicalgia: Secondary | ICD-10-CM | POA: Diagnosis not present

## 2017-05-12 DIAGNOSIS — M791 Myalgia, unspecified site: Secondary | ICD-10-CM | POA: Diagnosis not present

## 2017-05-17 DIAGNOSIS — L570 Actinic keratosis: Secondary | ICD-10-CM | POA: Diagnosis not present

## 2017-05-17 DIAGNOSIS — B078 Other viral warts: Secondary | ICD-10-CM | POA: Diagnosis not present

## 2017-06-02 DIAGNOSIS — R51 Headache: Secondary | ICD-10-CM | POA: Diagnosis not present

## 2017-06-02 DIAGNOSIS — M542 Cervicalgia: Secondary | ICD-10-CM | POA: Diagnosis not present

## 2017-06-02 DIAGNOSIS — G518 Other disorders of facial nerve: Secondary | ICD-10-CM | POA: Diagnosis not present

## 2017-06-02 DIAGNOSIS — M791 Myalgia, unspecified site: Secondary | ICD-10-CM | POA: Diagnosis not present

## 2017-06-16 DIAGNOSIS — R51 Headache: Secondary | ICD-10-CM | POA: Diagnosis not present

## 2017-06-16 DIAGNOSIS — G518 Other disorders of facial nerve: Secondary | ICD-10-CM | POA: Diagnosis not present

## 2017-06-16 DIAGNOSIS — M791 Myalgia, unspecified site: Secondary | ICD-10-CM | POA: Diagnosis not present

## 2017-06-16 DIAGNOSIS — M542 Cervicalgia: Secondary | ICD-10-CM | POA: Diagnosis not present

## 2017-06-30 DIAGNOSIS — R51 Headache: Secondary | ICD-10-CM | POA: Diagnosis not present

## 2017-06-30 DIAGNOSIS — M791 Myalgia, unspecified site: Secondary | ICD-10-CM | POA: Diagnosis not present

## 2017-06-30 DIAGNOSIS — G518 Other disorders of facial nerve: Secondary | ICD-10-CM | POA: Diagnosis not present

## 2017-06-30 DIAGNOSIS — M542 Cervicalgia: Secondary | ICD-10-CM | POA: Diagnosis not present

## 2017-06-30 DIAGNOSIS — G509 Disorder of trigeminal nerve, unspecified: Secondary | ICD-10-CM | POA: Diagnosis not present

## 2017-06-30 DIAGNOSIS — G500 Trigeminal neuralgia: Secondary | ICD-10-CM | POA: Diagnosis not present

## 2017-07-09 ENCOUNTER — Other Ambulatory Visit: Payer: Self-pay | Admitting: Cardiovascular Disease

## 2017-07-09 ENCOUNTER — Other Ambulatory Visit: Payer: Self-pay | Admitting: Family Medicine

## 2017-07-11 NOTE — Telephone Encounter (Signed)
Electronic refill request Last office visit 04/15/17 Last refill 01/11/17 #90/1

## 2017-07-12 NOTE — Telephone Encounter (Signed)
Sent. Thanks.   

## 2017-07-14 DIAGNOSIS — R51 Headache: Secondary | ICD-10-CM | POA: Diagnosis not present

## 2017-07-14 DIAGNOSIS — G500 Trigeminal neuralgia: Secondary | ICD-10-CM | POA: Diagnosis not present

## 2017-07-14 DIAGNOSIS — G509 Disorder of trigeminal nerve, unspecified: Secondary | ICD-10-CM | POA: Diagnosis not present

## 2017-07-14 DIAGNOSIS — M542 Cervicalgia: Secondary | ICD-10-CM | POA: Diagnosis not present

## 2017-07-14 DIAGNOSIS — G518 Other disorders of facial nerve: Secondary | ICD-10-CM | POA: Diagnosis not present

## 2017-07-14 DIAGNOSIS — M791 Myalgia, unspecified site: Secondary | ICD-10-CM | POA: Diagnosis not present

## 2017-07-22 DIAGNOSIS — B078 Other viral warts: Secondary | ICD-10-CM | POA: Diagnosis not present

## 2017-07-22 DIAGNOSIS — L570 Actinic keratosis: Secondary | ICD-10-CM | POA: Diagnosis not present

## 2017-07-22 DIAGNOSIS — L820 Inflamed seborrheic keratosis: Secondary | ICD-10-CM | POA: Diagnosis not present

## 2017-07-29 DIAGNOSIS — G518 Other disorders of facial nerve: Secondary | ICD-10-CM | POA: Diagnosis not present

## 2017-07-29 DIAGNOSIS — M542 Cervicalgia: Secondary | ICD-10-CM | POA: Diagnosis not present

## 2017-07-29 DIAGNOSIS — M791 Myalgia, unspecified site: Secondary | ICD-10-CM | POA: Diagnosis not present

## 2017-07-29 DIAGNOSIS — R51 Headache: Secondary | ICD-10-CM | POA: Diagnosis not present

## 2017-08-19 ENCOUNTER — Ambulatory Visit (HOSPITAL_COMMUNITY)
Admission: RE | Admit: 2017-08-19 | Discharge: 2017-08-19 | Disposition: A | Discharge status: Home / Self Care | Payer: Medicare Other | Source: Ambulatory Visit | Attending: Cardiovascular Disease | Admitting: Cardiovascular Disease

## 2017-08-19 DIAGNOSIS — I6523 Occlusion and stenosis of bilateral carotid arteries: Secondary | ICD-10-CM | POA: Diagnosis not present

## 2017-08-23 ENCOUNTER — Other Ambulatory Visit: Payer: Self-pay | Admitting: Nurse Practitioner

## 2017-08-23 DIAGNOSIS — I6523 Occlusion and stenosis of bilateral carotid arteries: Secondary | ICD-10-CM

## 2017-09-12 ENCOUNTER — Encounter: Payer: Self-pay | Admitting: Physician Assistant

## 2017-09-12 DIAGNOSIS — G518 Other disorders of facial nerve: Secondary | ICD-10-CM | POA: Diagnosis not present

## 2017-09-12 DIAGNOSIS — M542 Cervicalgia: Secondary | ICD-10-CM | POA: Diagnosis not present

## 2017-09-12 DIAGNOSIS — I739 Peripheral vascular disease, unspecified: Secondary | ICD-10-CM

## 2017-09-12 DIAGNOSIS — I779 Disorder of arteries and arterioles, unspecified: Secondary | ICD-10-CM

## 2017-09-12 DIAGNOSIS — R51 Headache: Secondary | ICD-10-CM | POA: Diagnosis not present

## 2017-09-12 DIAGNOSIS — M791 Myalgia, unspecified site: Secondary | ICD-10-CM | POA: Diagnosis not present

## 2017-09-12 HISTORY — DX: Disorder of arteries and arterioles, unspecified: I77.9

## 2017-09-12 NOTE — Progress Notes (Signed)
Cardiology Office Note:    Date:  09/13/2017   ID:  Tony Hunt, DOB [birth year], MRN [id number]  PCP:  Tonia Ghent, MD  Cardiologist:  Mertie Moores, MD  Vascular surgeon:  Dr. Trula Slade  Referring MD: Tonia Ghent, MD   Chief Complaint  Patient presents with  . Follow-up    CAD    History of Present Illness:    Tony Hunt is a 62 y.o. male with coronary artery disease s/p drug eluting stent to the LAD in 2014, hypertension, hyperlipidemia, hypothyroidism, abdominal aortic aneurysm, carotid artery disease.  Cardiac Catheterization in 04/2016 demonstrated patent LAD stent.  Last seen by Dr. Acie Fredrickson in 03/2017.    [name] returns for follow-up on coronary artery disease.  He is here alone.  Since last seen, he has been doing well.  He denies chest discomfort, shortness of breath, paroxysmal nocturnal dyspnea, lower extremity swelling or syncope.  Prior CV studies:   The following studies were reviewed today:  Carotid US 08/19/17 R 60-79; L 1-39 R vertebral occluded FU 12 months  AAA [place] 03/28/17 ([facility]) Final Interpretation: Abdominal Aorta: There is evidence of abnormal dilitation of the Distal Abdominal aorta. There is evidence of abnormal dilation of the Right Common Iliac artery and Left Common Iliac artery. The largest aortic measurement is 3.0 cm. Previous diameter  measurement was 2.8 cm obtained on 03/31/2015.  Echo 08/27/16 Mild LVH, EF 55-60, no RWMA, Gr 1 DD  Cardiac Catheterization 05/28/16 LAD prox 30, mid stent patent; D1 40 LCx ok; OM2 50, 50 RCA prox 20 EF 55-65 Diagnostic Diagram    Past Medical History:  Diagnosis Date  . AAA (abdominal aortic aneurysm) without rupture ([place]) 02/14/2014   [place] 1/19:  AAA 3 cm  . ALCOHOL ABUSE, HX OF    distant history  . Anxiety    occ panic sx, increased after death of mother  . ANXIETY DEPRESSION 05/10/2007   history of, during difficult relationship  . CAD (coronary artery disease) 08/28/2012   08/25/2012  Successful PTCA/DES x mid LAD // LAD prox 30, mid stent patent; D1 40; LCx ok; OM2 50, 50; RCA prox 20; EF 55-65   . 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 Carotid artery disease ([facility]) 09/12/2017   [place] 6/19:  R 60-79; L 1-39; R vertebral occluded; FU 12 months  . 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 Dilatation of aorta ([facility]) 02/14/2014  . ESOPHAGEAL STRICTURE 05/10/2007  . GERD 05/10/2007  . Heart disease   . HIATAL HERNIA 05/10/2007  . HYPERLIPIDEMIA 05/10/2007  . HYPERTENSION 05/10/2007  . 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 SLEEP APNEA 05/10/2007   lost 100lb-not now  . Upper airway cough syndrome    Per Dr. Melvyn Novas, pulmonary    Surgical Hx: The patient  has a past surgical history that includes Back surgery (1997); Wrist fracture surgery (08/21/10); Colonoscopy; Appendectomy; Coronary stent placement; left heart catheterization with coronary angiogram (N/A, 08/25/2012); left heart catheterization with coronary angiogram (N/A, 11/23/2013); Laryngoscopy / bronchoscopy / esophagoscopy (2012); Carpometacarpal (cmc) fusion of thumb (Left, 04/02/2014); and LEFT HEART CATH AND CORONARY ANGIOGRAPHY (N/A, 05/28/2016).   Current Medications: Current Meds  Medication Sig  . aspirin EC 81 MG tablet Take 1 tablet (81 mg total) by mouth daily.  . clopidogrel (PLAVIX) 75 MG tablet TAKE 1 TABLET DAILY WITH BREAKFAST  . cyclobenzaprine (FLEXERIL) 10 MG tablet Take 0.5-1  tablets (5-10 mg total) by mouth 3 (three) times daily as needed for muscle spasms (and for headache.  sedation caution).  . fenofibrate 160 MG tablet TAKE 1 TABLET DAILY  . gabapentin (NEURONTIN) 100 MG capsule Take 1-3 capsules (100-300 mg total) by mouth 2 (two) times daily as needed.  Marland Kitchen levothyroxine (SYNTHROID, LEVOTHROID) 100 MCG tablet TAKE 1 TABLET DAILY BEFORE BREAKFAST  . Melatonin 10 MG TABS Take 10 mg by mouth at bedtime.  . metoprolol tartrate  (LOPRESSOR) 25 MG tablet TAKE ONE-HALF (1/2) TABLET TWICE A DAY  . nitroGLYCERIN (NITROSTAT) 0.4 MG SL tablet Place 1 tablet (0.4 mg total) under the tongue every 5 (five) minutes as needed for chest pain (do not exceed 3 pills during one episode).  . ondansetron (ZOFRAN) 4 MG tablet Take 1 tablet (4 mg total) by mouth every 8 (eight) hours as needed for nausea or vomiting.  . pantoprazole (PROTONIX) 40 MG tablet TAKE 1 TABLET DAILY  . rosuvastatin (CRESTOR) 20 MG tablet TAKE 1 TABLET DAILY  . tamsulosin (FLOMAX) 0.4 MG CAPS capsule TAKE 1 CAPSULE DAILY  . [DISCONTINUED] nitroGLYCERIN (NITROSTAT) 0.4 MG SL tablet Place 1 tablet (0.4 mg total) under the tongue every 5 (five) minutes as needed for chest pain (do not exceed 3 pills during one episode).     Allergies:   Iohexol; Pravastatin; Imdur [isosorbide dinitrate]; Lisinopril; Codeine; Iodine-131; Simvastatin; and Statins   Social History   Tobacco Use  . Smoking status: Former Smoker    Last attempt to quit: 09/30/2010    Years since quitting: 6.9  . Smokeless tobacco: Former Network engineer Use Topics  . Alcohol use: Yes    Comment: 2-3 drinks a week at most  . Drug use: No     Family Hx: The patient's family history includes Brain cancer in his father; Dementia in his father; Heart disease in his brother and mother; Hyperlipidemia in his brother; Hypertension in his brother and mother. There is no history of Colon cancer or Prostate cancer.  ROS:   Please see the history of present illness.    Review of Systems  Hematologic/Lymphatic: Bruises/bleeds easily.  Neurological: Positive for headaches.   All other systems reviewed and are negative.   EKGs/Labs/Other Test Reviewed:    EKG:  EKG is not ordered today.    Recent Labs: 10/21/2016: TSH 0.93 03/16/2017: ALT 10; BUN 20; Creatinine, Ser 1.46; Potassium 4.5; Sodium 139   Recent Lipid Panel Lab Results  Component Value Date/Time   CHOL 195 03/16/2017 10:12 AM   TRIG 235  (H) 03/16/2017 10:12 AM   HDL 36 (L) 03/16/2017 10:12 AM   CHOLHDL 5.4 (H) 03/16/2017 10:12 AM   CHOLHDL 7 10/21/2016 11:13 AM   LDLCALC 112 (H) 03/16/2017 10:12 AM   LDLDIRECT 110.0 10/21/2016 11:13 AM    Physical Exam:    VS:  BP 124/78   Pulse 62   Ht 5\' 7"  (1.702 m)   Wt 198 lb 12.8 oz (90.2 kg)   SpO2 96%   BMI 31.14 kg/m     Wt Readings from Last 3 Encounters:  09/13/17 198 lb 12.8 oz (90.2 kg)  04/15/17 204 lb 8 oz (92.8 kg)  03/30/17 205 lb (93 kg)     Physical Exam  Constitutional: He is oriented to person, place, and time. He appears well-developed and well-nourished. No distress.  HENT:  Head: Normocephalic and atraumatic.  Neck: Neck supple. No JVD present.  Cardiovascular: Normal rate, regular rhythm, S1 normal  and S2 normal.  No murmur heard. Pulmonary/Chest: Breath sounds normal. He has no rales.  Abdominal: Soft. There is no hepatomegaly.  Musculoskeletal: He exhibits no edema.  Neurological: He is alert and oriented to person, place, and time.  Skin: Skin is warm and dry.  Psychiatric: He has a normal mood and affect.    ASSESSMENT & PLAN:    Coronary artery disease involving native coronary artery of native heart without angina pectoris History of drug-eluting stent to the LAD in 2014.  Cardiac catheterization in March 2018 with patent LAD stent.  He is doing well without anginal symptoms.  Continue aspirin, Clopidogrel, beta-blocker, statin.  AAA (abdominal aortic aneurysm) without rupture ([facility]) AAA measuring 3 cm in January 2019.  This is followed by vascular surgery.  Bilateral carotid artery disease, unspecified type ([place]) Carotid ultrasound in June 2019 demonstrated 60-79% stenosis on the right.  Continue aspirin, statin.  Follow-up carotid ultrasound will be obtained in June 2020.  Essential hypertension The patient's blood pressure is controlled on his current regimen.  Continue current therapy.   Other hyperlipidemia Continue moderate  intensity statin.  Dispo:  Return in about 6 months (around 03/16/2018) for Routine Follow Up, w/ Dr. Acie Fredrickson.   Medication Adjustments/Labs and Tests Ordered: Current medicines are reviewed at length with the patient today.  Concerns regarding medicines are outlined above.  Tests Ordered: No orders of the defined types were placed in this encounter.  Medication Changes: Meds ordered this encounter  Medications  . nitroGLYCERIN (NITROSTAT) 0.4 MG SL tablet    Sig: Place 1 tablet (0.4 mg total) under the tongue every 5 (five) minutes as needed for chest pain (do not exceed 3 pills during one episode).    Dispense:  25 tablet    Refill:  6    Signed, Richardson Dopp, PA-C  09/13/2017 9:46 AM    [facility] [address], [place], [place]  [address] Phone: [phone]; Fax: [phone]

## 2017-09-13 ENCOUNTER — Encounter: Payer: Self-pay | Admitting: Physician Assistant

## 2017-09-13 ENCOUNTER — Encounter (INDEPENDENT_AMBULATORY_CARE_PROVIDER_SITE_OTHER): Payer: Self-pay

## 2017-09-13 ENCOUNTER — Ambulatory Visit (INDEPENDENT_AMBULATORY_CARE_PROVIDER_SITE_OTHER): Payer: Medicare Other | Admitting: Physician Assistant

## 2017-09-13 VITALS — BP 124/78 | HR 62 | Ht 67.0 in | Wt 198.8 lb

## 2017-09-13 DIAGNOSIS — I779 Disorder of arteries and arterioles, unspecified: Secondary | ICD-10-CM

## 2017-09-13 DIAGNOSIS — I714 Abdominal aortic aneurysm, without rupture, unspecified: Secondary | ICD-10-CM

## 2017-09-13 DIAGNOSIS — I2510 Atherosclerotic heart disease of native coronary artery without angina pectoris: Secondary | ICD-10-CM | POA: Diagnosis not present

## 2017-09-13 DIAGNOSIS — E7849 Other hyperlipidemia: Secondary | ICD-10-CM | POA: Diagnosis not present

## 2017-09-13 DIAGNOSIS — I6523 Occlusion and stenosis of bilateral carotid arteries: Secondary | ICD-10-CM | POA: Diagnosis not present

## 2017-09-13 DIAGNOSIS — I739 Peripheral vascular disease, unspecified: Secondary | ICD-10-CM

## 2017-09-13 DIAGNOSIS — I10 Essential (primary) hypertension: Secondary | ICD-10-CM | POA: Diagnosis not present

## 2017-09-13 MED ORDER — NITROGLYCERIN 0.4 MG SL SUBL: 0.4000 mg | SUBLINGUAL_TABLET | SUBLINGUAL | 6 refills | Status: DC | PRN

## 2017-09-13 NOTE — Patient Instructions (Signed)
Medication Instructions: Your physician recommends that you continue on your current medications as directed. Please refer to the Current Medication list given to you today.   Labwork: None Ordered  Procedures/Testing: None Ordered  Follow-Up: Your physician recommends that you schedule a follow-up appointment in: 6 months with Dr. Acie Fredrickson    Any Additional Special Instructions Will Be Listed Below (If Applicable).     If you need a refill on your cardiac medications before your next appointment, please call your pharmacy.

## 2017-09-17 ENCOUNTER — Other Ambulatory Visit: Payer: Self-pay | Admitting: Cardiovascular Disease

## 2017-10-09 ENCOUNTER — Other Ambulatory Visit: Payer: Self-pay | Admitting: Family Medicine

## 2017-10-23 ENCOUNTER — Other Ambulatory Visit: Payer: Self-pay | Admitting: Family Medicine

## 2017-10-23 DIAGNOSIS — E785 Hyperlipidemia, unspecified: Secondary | ICD-10-CM

## 2017-10-23 DIAGNOSIS — M109 Gout, unspecified: Secondary | ICD-10-CM

## 2017-10-23 DIAGNOSIS — I10 Essential (primary) hypertension: Secondary | ICD-10-CM

## 2017-10-24 ENCOUNTER — Ambulatory Visit (INDEPENDENT_AMBULATORY_CARE_PROVIDER_SITE_OTHER): Payer: Medicare Other

## 2017-10-24 ENCOUNTER — Ambulatory Visit: Payer: Medicare Other

## 2017-10-24 VITALS — BP 120/84 | HR 53 | Temp 98.0°F | Ht 65.25 in | Wt 197.2 lb

## 2017-10-24 DIAGNOSIS — M109 Gout, unspecified: Secondary | ICD-10-CM | POA: Diagnosis not present

## 2017-10-24 DIAGNOSIS — I6523 Occlusion and stenosis of bilateral carotid arteries: Secondary | ICD-10-CM | POA: Diagnosis not present

## 2017-10-24 DIAGNOSIS — Z0000 Encounter for general adult medical examination without abnormal findings: Secondary | ICD-10-CM

## 2017-10-24 DIAGNOSIS — E785 Hyperlipidemia, unspecified: Secondary | ICD-10-CM

## 2017-10-24 LAB — COMPREHENSIVE METABOLIC PANEL
ALT: 10 U/L (ref 0–53)
AST: 17 U/L (ref 0–37)
Albumin: 3.9 g/dL (ref 3.5–5.2)
Alkaline Phosphatase: 37 U/L — ABNORMAL LOW (ref 39–117)
BUN: 20 mg/dL (ref 6–23)
CHLORIDE: 106 meq/L (ref 96–112)
CO2: 25 meq/L (ref 19–32)
CREATININE: 1.48 mg/dL (ref 0.40–1.50)
Calcium: 9.8 mg/dL (ref 8.4–10.5)
GFR: 50.96 mL/min — ABNORMAL LOW (ref >60.00)
GLUCOSE: 104 mg/dL — AB (ref 70–99)
POTASSIUM: 3.9 meq/L (ref 3.5–5.1)
SODIUM: 137 meq/L (ref 135–145)
Total Bilirubin: 0.5 mg/dL (ref 0.2–1.2)
Total Protein: 6.8 g/dL (ref 6.0–8.3)

## 2017-10-24 LAB — CBC WITH DIFFERENTIAL/PLATELET
Basophils Absolute: 0 10*3/uL (ref 0.0–0.1)
Basophils Relative: 0.9 % (ref 0.0–3.0)
EOS PCT: 2.2 % (ref 0.0–5.0)
Eosinophils Absolute: 0.1 10*3/uL (ref 0.0–0.7)
HEMATOCRIT: 44.6 % (ref 39.0–52.0)
Hemoglobin: 15.1 g/dL (ref 13.0–17.0)
LYMPHS PCT: 22.4 % (ref 12.0–46.0)
Lymphs Abs: 1.2 10*3/uL (ref 0.7–4.0)
MCHC: 33.7 g/dL (ref 30.0–36.0)
MCV: 92.3 fl (ref 78.0–100.0)
MONOS PCT: 10 % (ref 3.0–12.0)
Monocytes Absolute: 0.5 10*3/uL (ref 0.1–1.0)
NEUTROS ABS: 3.5 10*3/uL (ref 1.4–7.7)
Neutrophils Relative %: 64.5 % (ref 43.0–77.0)
PLATELETS: 261 10*3/uL (ref 150.0–400.0)
RBC: 4.84 Mil/uL (ref 4.22–5.81)
RDW: 14 % (ref 11.5–15.5)
WBC: 5.4 10*3/uL (ref 4.0–10.5)

## 2017-10-24 LAB — LIPID PANEL
Cholesterol: 176 mg/dL (ref 0–200)
HDL: 33.4 mg/dL — AB (ref >39.00)
NONHDL: 142.81
Total CHOL/HDL Ratio: 5
Triglycerides: 278 mg/dL — ABNORMAL HIGH (ref 0.0–149.0)
VLDL: 55.6 mg/dL — AB (ref 0.0–40.0)

## 2017-10-24 LAB — LDL CHOLESTEROL, DIRECT: Direct LDL: 116 mg/dL

## 2017-10-24 LAB — URIC ACID: Uric Acid, Serum: 5.3 mg/dL (ref 4.0–7.8)

## 2017-10-24 LAB — TSH: TSH: 2.1 u[IU]/mL (ref 0.35–4.50)

## 2017-10-24 NOTE — Patient Instructions (Signed)
Tony Hunt , Thank you for taking time to come for your Medicare Wellness Visit. I appreciate your ongoing commitment to your health goals. Please review the following plan we discussed and let me know if I can assist you in the future.   These are the goals we discussed: Goals    . Increase physical activity     Starting 10/21/16, I will continue to exercise 15-30 min 5 days per week.        This is a list of the screening recommended for you and due dates:  Health Maintenance  Topic Date Due  . Flu Shot  05/31/2018*  . Colon Cancer Screening  09/24/2020  . Tetanus Vaccine  03/06/2022  .  Hepatitis C: One time screening is recommended by Center for Disease Control  (CDC) for  adults born from [birth year] through 1966.   Completed  . HIV Screening  Completed  *Topic was postponed. The date shown is not the original due date.   Preventive Care for Adults  A healthy lifestyle and preventive care can promote health and wellness. Preventive health guidelines for adults include the following key practices.  . A routine yearly physical is a good way to check with your health care provider about your health and preventive screening. It is a chance to share any concerns and updates on your health and to receive a thorough exam.  . Visit your dentist for a routine exam and preventive care every 6 months. Brush your teeth twice a day and floss once a day. Good oral hygiene prevents tooth decay and gum disease.  . The frequency of eye exams is based on your age, health, family medical history, use  of contact lenses, and other factors. Follow your health care provider's recommendations for frequency of eye exams.  . Eat a healthy diet. Foods like vegetables, fruits, whole grains, low-fat dairy products, and lean protein foods contain the nutrients you need without too many calories. Decrease your intake of foods high in solid fats, added sugars, and salt. Eat the right amount of calories for you. Get  information about a proper diet from your health care provider, if necessary.  . Regular physical exercise is one of the most important things you can do for your health. Most adults should get at least 150 minutes of moderate-intensity exercise (any activity that increases your heart rate and causes you to sweat) each week. In addition, most adults need muscle-strengthening exercises on 2 or more days a week.  Silver Sneakers may be a benefit available to you. To determine eligibility, you may visit the website: www.silversneakers.com or contact program at 9090785476 Mon-Fri between 8AM-8PM.   . Maintain a healthy weight. The body mass index (BMI) is a screening tool to identify possible weight problems. It provides an estimate of body fat based on height and weight. Your health care provider can find your BMI and can help you achieve or maintain a healthy weight.   For adults 20 years and older: ? A BMI below 18.5 is considered underweight. ? A BMI of 18.5 to 24.9 is normal. ? A BMI of 25 to 29.9 is considered overweight. ? A BMI of 30 and above is considered obese.   . Maintain normal blood lipids and cholesterol levels by exercising and minimizing your intake of saturated fat. Eat a balanced diet with plenty of fruit and vegetables. Blood tests for lipids and cholesterol should begin at age 60 and be repeated every 5 years. If your  lipid or cholesterol levels are high, you are over 50, or you are at high risk for heart disease, you may need your cholesterol levels checked more frequently. Ongoing high lipid and cholesterol levels should be treated with medicines if diet and exercise are not working.  . If you smoke, find out from your health care provider how to quit. If you do not use tobacco, please do not start.  . If you choose to drink alcohol, please do not consume more than 2 drinks per day. One drink is considered to be 12 ounces (355 mL) of beer, 5 ounces (148 mL) of wine, or 1.5  ounces (44 mL) of liquor.  . If you are 74-75 years old, ask your health care provider if you should take aspirin to prevent strokes.  . Use sunscreen. Apply sunscreen liberally and repeatedly throughout the day. You should seek shade when your shadow is shorter than you. Protect yourself by wearing long sleeves, pants, a wide-brimmed hat, and sunglasses year round, whenever you are outdoors.  . Once a month, do a whole body skin exam, using a mirror to look at the skin on your back. Tell your health care provider of new moles, moles that have irregular borders, moles that are larger than a pencil eraser, or moles that have changed in shape or color.

## 2017-10-24 NOTE — Progress Notes (Signed)
PCP notes:   Health maintenance:  Flu vaccine - addressed  Abnormal screenings:   Hearing - failed  Hearing Screening   125Hz  250Hz  500Hz  1000Hz  2000Hz  3000Hz  4000Hz  6000Hz  8000Hz   Right ear:   40 40 40  0    Left ear:   40 40 40  0     Patient concerns:   None  Nurse concerns:  None  Next PCP appt:   11/03/17 @ 0945  I reviewed health advisor's note, was available for consultation on the day of service listed in this note, and agree with documentation and plan. Elsie Stain, MD.

## 2017-10-24 NOTE — Progress Notes (Signed)
Subjective:   Tony Hunt is a 62 y.o. male who presents for Medicare Annual/Subsequent preventive examination.  Review of Systems:  N/A Cardiac Risk Factors include: advanced age (>86men, >71 women);obesity (BMI >30kg/m2);dyslipidemia;hypertension     Objective:    Vitals: BP 120/84 (BP Location: Right Arm, Patient Position: Sitting, Cuff Size: Normal)   Pulse (!) 53   Temp 98 F (36.7 C) (Oral)   Ht 5' 5.25" (1.657 m) Comment: no shoes  Wt 197 lb 4 oz (89.5 kg)   SpO2 97%   BMI 32.57 kg/m   Body mass index is 32.57 kg/m.  Advanced Directives 10/24/2017 01/06/2017 10/21/2016 05/28/2016 03/31/2015 08/13/2014 04/02/2014  Does Patient Have a Medical Advance Directive? No No No No No No No  Would patient like information on creating a medical advance directive? No - Patient declined - - Yes (MAU/Ambulatory/Procedural Areas - Information given) Yes - Educational materials given No - patient declined information -  Pre-existing out of facility DNR order (yellow form or pink MOST form) - - - - - - -    Tobacco Social History   Tobacco Use  Smoking Status Former Smoker  . Last attempt to quit: 09/30/2010  . Years since quitting: 7.0  Smokeless Tobacco Former Engineer, structural given: No   Clinical Intake:  Pre-visit preparation completed: Yes  Pain : No/denies pain Pain Score: 0-No pain     Nutritional Status: BMI > 30  Obese Nutritional Risks: None Diabetes: No  How often do you need to have someone help you when you read instructions, pamphlets, or other written materials from your doctor or pharmacy?: 1 - Never What is the last grade level you completed in school?: 12th grade  Interpreter Needed?: No  Comments: pt lives with spouse Information entered by :: LPinson, LPN  Past Medical History:  Diagnosis Date  . AAA (abdominal aortic aneurysm) without rupture ([place]) 02/14/2014   [place] 1/19:  AAA 3 cm  . ALCOHOL ABUSE, HX OF    distant history  . Anxiety    occ  panic sx, increased after death of mother  . ANXIETY DEPRESSION 05/10/2007   history of, during difficult relationship  . CAD (coronary artery disease) 08/28/2012   08/25/2012 Successful PTCA/DES x mid LAD // LAD prox 30, mid stent patent; D1 40; LCx ok; OM2 50, 50; RCA prox 20; EF 55-65   . 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 Carotid artery disease ([place]) 09/12/2017   [place] 6/19:  R 60-79; L 1-39; R vertebral occluded; FU 12 months  . 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 Dilatation of aorta ([facility]) 02/14/2014  . ESOPHAGEAL STRICTURE 05/10/2007  . GERD 05/10/2007  . Heart disease   . HIATAL HERNIA 05/10/2007  . HYPERLIPIDEMIA 05/10/2007  . HYPERTENSION 05/10/2007  . 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 SLEEP APNEA 05/10/2007   lost 100lb-not now  . Upper airway cough syndrome    Per Dr. Melvyn Novas, pulmonary    Past Surgical History:  Procedure Laterality Date  . APPENDECTOMY    . BACK SURGERY  1997   lower back x3  . CARPOMETACARPAL ([facility]) FUSION OF THUMB Left 04/02/2014   Procedure: CARPOMETACARPAL ([place]) FUSION OF THUMB/LEFT THUMB INTERPHALNGEAL JOINT FUSION;  Surgeon: Jolyn Nap, MD;  Location: [facility];  Service: Orthopedics;  Laterality: Left;  . COLONOSCOPY    . CORONARY STENT PLACEMENT  2.75 x 12 mm Promus Premier DES was deployed in the mid LAD. The stent was post-dilated with a 3.0 x 9 mm  balloon - Mid LAD.  Marland Kitchen LARYNGOSCOPY / BRONCHOSCOPY / ESOPHAGOSCOPY  2012   bx  . LEFT HEART CATH AND CORONARY ANGIOGRAPHY N/A 05/28/2016   Procedure: Left Heart Cath and Coronary Angiography;  Surgeon: Belva Crome, MD;  Location: [facility];  Service: Cardiovascular;  Laterality: N/A;  . LEFT HEART CATHETERIZATION WITH CORONARY ANGIOGRAM N/A 08/25/2012   Procedure: LEFT HEART CATHETERIZATION WITH CORONARY ANGIOGRAM;  Surgeon: Burnell Blanks, MD;  Location: [facility] CATH LAB;  Service:  Cardiovascular;  Laterality: N/A;  . LEFT HEART CATHETERIZATION WITH CORONARY ANGIOGRAM N/A 11/23/2013   Procedure: LEFT HEART CATHETERIZATION WITH CORONARY ANGIOGRAM;  Surgeon: Jettie Booze, MD;  Location: [facility] CATH LAB;  Service: Cardiovascular;  Laterality: N/A;  . WRIST FRACTURE SURGERY  08/21/10   Right wrist x4   Family History  Problem Relation Age of Onset  . Brain cancer Father   . Dementia Father        h/o brain tumor  . Heart disease Mother   . Hypertension Mother   . Heart disease Brother   . Hypertension Brother   . Hyperlipidemia Brother   . Colon cancer Neg Hx   . Prostate cancer Neg Hx    Social History   Socioeconomic History  . Marital status: Married    Spouse name: Not on file  . Number of children: 1  . Years of education: Not on file  . Highest education level: Not on file  Occupational History  . 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 Financial resource strain: Not on file  . Food insecurity:    Worry: Not on file    Inability: Not on file  . Transportation needs:    Medical: Not on file    Non-medical: Not on file  Tobacco Use  . Smoking status: Former Smoker    Last attempt to quit: 09/30/2010    Years since quitting: 7.0  . Smokeless tobacco: Former Network engineer and Sexual Activity  . Alcohol use: Yes    Comment: 2-3 drinks a week at most  . Drug use: No  . Sexual activity: Not Currently  Lifestyle  . Physical activity:    Days per week: Not on file    Minutes per session: Not on file  . Stress: Not on file  Relationships  . Social connections:    Talks on phone: Not on file    Gets together: Not on file    Attends religious service: Not on file    Active member of club or organization: Not on file    Attends meetings of clubs or organizations: Not on file    Relationship status: Not on file  Other Topics Concern  . Not on file  Social History Narrative   Married 8/13. 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  Noted tinnitus, h/o noise exposure that was service related.     Outpatient Encounter Medications as of 10/24/2017  Medication Sig  . acetaminophen (TYLENOL) 500 MG tablet Take 500-1,000 mg by mouth daily as needed for moderate pain or headache.  Marland Kitchen aspirin EC 81 MG tablet Take 1 tablet (81 mg total) by mouth daily.  . clopidogrel (PLAVIX) 75 MG tablet  TAKE 1 TABLET DAILY WITH BREAKFAST  . cyclobenzaprine (FLEXERIL) 10 MG tablet Take 0.5-1 tablets (5-10 mg total) by mouth 3 (three) times daily as needed for muscle spasms (and for headache.  sedation caution).  . fenofibrate 160 MG tablet TAKE 1 TABLET DAILY  . fish oil-omega-3 fatty acids 1000 MG capsule Take 1 g by mouth daily.  Marland Kitchen gabapentin (NEURONTIN) 100 MG capsule Take 1-3 capsules (100-300 mg total) by mouth 2 (two) times daily as needed.  Marland Kitchen levothyroxine (SYNTHROID, LEVOTHROID) 100 MCG tablet TAKE 1 TABLET DAILY BEFORE BREAKFAST  . Melatonin 10 MG TABS Take 10 mg by mouth at bedtime.  . metoprolol tartrate (LOPRESSOR) 25 MG tablet TAKE ONE-HALF (1/2) TABLET TWICE A DAY  . nitroGLYCERIN (NITROSTAT) 0.4 MG SL tablet Place 1 tablet (0.4 mg total) under the tongue every 5 (five) minutes as needed for chest pain (do not exceed 3 pills during one episode).  . ondansetron (ZOFRAN) 4 MG tablet Take 1 tablet (4 mg total) by mouth every 8 (eight) hours as needed for nausea or vomiting.  . pantoprazole (PROTONIX) 40 MG tablet TAKE 1 TABLET DAILY  . rosuvastatin (CRESTOR) 20 MG tablet TAKE 1 TABLET DAILY  . tamsulosin (FLOMAX) 0.4 MG CAPS capsule TAKE 1 CAPSULE DAILY   No facility-administered encounter medications on file as of 10/24/2017.     Activities of Daily Living In your present state of health, do you have any difficulty performing the following activities: 10/24/2017  Hearing? N  Vision? N  Difficulty concentrating or making decisions? N  Walking or  climbing stairs? N  Dressing or bathing? N  Doing errands, shopping? N  Preparing Food and eating ? N  Using the Toilet? N  In the past six months, have you accidently leaked urine? N  Do you have problems with loss of bowel control? N  Managing your Medications? N  Managing your Finances? N  Housekeeping or managing your Housekeeping? N  Some recent data might be hidden    Patient Care Team: Tonia Ghent, MD as PCP - General (Family Medicine) Nahser, Wonda Cheng, MD as PCP - Cardiology (Cardiology)   Assessment:   This is a routine wellness examination for Tony Hunt.   Hearing Screening   125Hz  250Hz  500Hz  1000Hz  2000Hz  3000Hz  4000Hz  6000Hz  8000Hz   Right ear:   40 40 40  0    Left ear:   40 40 40  0      Visual Acuity Screening   Right eye Left eye Both eyes  Without correction: 20/30-1 20/30-1 20/30-1  With correction:        Exercise Activities and Dietary recommendations Current Exercise Habits: Home exercise routine, Type of exercise: strength training/weights;walking, Time (Minutes): 20, Frequency (Times/Week): 5, Weekly Exercise (Minutes/Week): 100, Intensity: Moderate, Exercise limited by: None identified  Goals    . Increase physical activity     Starting 10/21/16, I will continue to exercise 15-30 min 5 days per week.        Fall Risk Fall Risk  10/24/2017 10/21/2016 10/28/2015 07/02/2013  Falls in the past year? No No No No   Depression Screen PHQ 2/9 Scores 10/24/2017 10/21/2016 10/28/2015 07/02/2013  PHQ - 2 Score 0 0 0 0  PHQ- 9 Score 0 0 - -    Cognitive Function MMSE - Mini Mental State Exam 10/24/2017 10/21/2016  Orientation to time 5 5  Orientation to Place 5 5  Registration 3 3  Attention/ Calculation 0 0  Recall 3 2  Recall-comments - pt was unable to recall 1 of 3 words  Language- name 2 objects 0 0  Language- repeat 1 1  Language- follow 3 step command 3 3  Language- read & follow direction 0 0  Write a sentence 0 0  Copy design 0 0  Total score  20 19       PLEASE NOTE: A Mini-Cog screen was completed. Maximum score is 20. A value of 0 denotes this part of [place] MMSE was not completed or the patient failed this part of the Mini-Cog screening.   Mini-Cog Screening Orientation to Time - Max 5 pts Orientation to Place - Max 5 pts Registration - Max 3 pts Recall - Max 3 pts Language Repeat - Max 1 pts Language Follow 3 Step Command - Max 3 pts  Immunization History  Administered Date(s) Administered  . Influenza Whole 01/13/2005  . Influenza, Seasonal, Injecte, Preservative Fre 12/06/2014  . Influenza,inj,Quad PF,6+ Mos 10/28/2015, 01/05/2017  . Influenza-Unspecified 11/20/2013  . Pneumococcal Polysaccharide-23 07/02/2013  . Td 04/01/2001  . Tdap 03/06/2012  . Zoster 08/01/2014    Screening Tests Health Maintenance  Topic Date Due  . INFLUENZA VACCINE  05/31/2018 (Originally 09/29/2017)  . COLONOSCOPY  09/24/2020  . TETANUS/TDAP  03/06/2022  . Hepatitis C Screening  Completed  . HIV Screening  Completed      Plan:     I have personally reviewed, addressed, and noted the following in the patient's chart:  A. Medical and social history B. Use of alcohol, tobacco or illicit drugs  C. Current medications and supplements D. Functional ability and status E.  Nutritional status F.  Physical activity G. Advance directives H. List of other physicians I.  Hospitalizations, surgeries, and ER visits in previous 12 months J.  [place] to include hearing, vision, cognitive, depression L. Referrals and appointments - none  In addition, I have reviewed and discussed with patient certain preventive protocols, quality metrics, and best practice recommendations. A written personalized care plan for preventive services as well as general preventive health recommendations were provided to patient.  See attached scanned questionnaire for additional information.   Signed,   Lindell Noe, MHA, BS, LPN [facility]

## 2017-10-27 DIAGNOSIS — L820 Inflamed seborrheic keratosis: Secondary | ICD-10-CM | POA: Diagnosis not present

## 2017-10-27 DIAGNOSIS — R809 Proteinuria, unspecified: Secondary | ICD-10-CM | POA: Diagnosis not present

## 2017-10-27 DIAGNOSIS — Z6833 Body mass index (BMI) 33.0-33.9, adult: Secondary | ICD-10-CM | POA: Diagnosis not present

## 2017-10-27 DIAGNOSIS — I129 Hypertensive chronic kidney disease with stage 1 through stage 4 chronic kidney disease, or unspecified chronic kidney disease: Secondary | ICD-10-CM | POA: Diagnosis not present

## 2017-10-27 DIAGNOSIS — N051 Unspecified nephritic syndrome with focal and segmental glomerular lesions: Secondary | ICD-10-CM | POA: Diagnosis not present

## 2017-10-27 DIAGNOSIS — B078 Other viral warts: Secondary | ICD-10-CM | POA: Diagnosis not present

## 2017-11-03 ENCOUNTER — Encounter: Payer: Self-pay | Admitting: Family Medicine

## 2017-11-03 ENCOUNTER — Ambulatory Visit (INDEPENDENT_AMBULATORY_CARE_PROVIDER_SITE_OTHER): Payer: Medicare Other | Admitting: Family Medicine

## 2017-11-03 VITALS — BP 102/60 | HR 54 | Temp 98.0°F | Resp 16 | Ht 65.25 in | Wt 197.2 lb

## 2017-11-03 DIAGNOSIS — Z7189 Other specified counseling: Secondary | ICD-10-CM

## 2017-11-03 DIAGNOSIS — E7849 Other hyperlipidemia: Secondary | ICD-10-CM | POA: Diagnosis not present

## 2017-11-03 DIAGNOSIS — G43109 Migraine with aura, not intractable, without status migrainosus: Secondary | ICD-10-CM | POA: Diagnosis not present

## 2017-11-03 DIAGNOSIS — I2510 Atherosclerotic heart disease of native coronary artery without angina pectoris: Secondary | ICD-10-CM | POA: Diagnosis not present

## 2017-11-03 DIAGNOSIS — I714 Abdominal aortic aneurysm, without rupture, unspecified: Secondary | ICD-10-CM

## 2017-11-03 DIAGNOSIS — Z23 Encounter for immunization: Secondary | ICD-10-CM | POA: Diagnosis not present

## 2017-11-03 DIAGNOSIS — R197 Diarrhea, unspecified: Secondary | ICD-10-CM

## 2017-11-03 DIAGNOSIS — N032 Chronic nephritic syndrome with diffuse membranous glomerulonephritis: Secondary | ICD-10-CM

## 2017-11-03 DIAGNOSIS — E039 Hypothyroidism, unspecified: Secondary | ICD-10-CM

## 2017-11-03 DIAGNOSIS — I6523 Occlusion and stenosis of bilateral carotid arteries: Secondary | ICD-10-CM | POA: Diagnosis not present

## 2017-11-03 DIAGNOSIS — M109 Gout, unspecified: Secondary | ICD-10-CM | POA: Diagnosis not present

## 2017-11-03 DIAGNOSIS — C329 Malignant neoplasm of larynx, unspecified: Secondary | ICD-10-CM

## 2017-11-03 DIAGNOSIS — Z0000 Encounter for general adult medical examination without abnormal findings: Secondary | ICD-10-CM

## 2017-11-03 MED ORDER — ROSUVASTATIN CALCIUM 20 MG PO TABS: 20.0000 mg | ORAL_TABLET | Freq: Every day | ORAL | 3 refills | Status: DC

## 2017-11-03 MED ORDER — GABAPENTIN 100 MG PO CAPS: ORAL_CAPSULE | ORAL | Status: DC

## 2017-11-03 NOTE — Patient Instructions (Signed)
Don't change your meds for now.   Take care.  Glad to see you.  [facility] to [facility].  I'll update cardiology about the recent event.  If you have more troubles then let us and cardiology know.

## 2017-11-03 NOTE — Progress Notes (Signed)
Flu 2019 Shingles 2016 PNA not due Tetanus 2014 Colonoscopy 2012 Prostate cancer screening 2019, d/w pt.  PSA wnl.   Advance directive- wife designated if patient were incapacitated.   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   HCV screening prev neg.   Hearing screen d/w pt.  Declined hearing aids.    Hypothyroidism. No ADE on med.  No dysphagia.  Voice at baseline.  Labs d/w pt.  Compliant.   Hypertension/CAD.   Using medication without problems or lightheadedness: yes Chest pain with exertion:no, but had 1 episode with CP at rest last week.  Used NTG with relief.  No sx in the meantime.   Edema: no Short of breath: some, only with bending over- he attributed that to inc abd birth.   Elevated Cholesterol: Using medications without problems: yes Muscle aches: no Diet compliance: encouraged.   [facility]:encour[facility], [facility]  Labs d/w pt.     AAA.  Per vascular surgery.  No abd pain.  Routine cautions d/w pt.   HA sig better with injections and gabapentin.    He has some episodic postprandial diarrhea w/o blood in stool.  No vomiting. No fevers. No black stools.  Going on for about 1 month.  More likely if he eats at [employer] or at other restaurants.  D/w pt about monitoring diet and avoiding greasy foods.    No recent gout flares.    He has yearly f/u with ENT.  I'll defer.  He agrees.    PMH and SH reviewed  ROS: Per HPI unless specifically indicated in ROS section   Meds, vitals, and allergies reviewed.   GEN: nad, alert and oriented HEENT: mucous membranes moist NECK: supple w/o LA CV: rrr PULM: ctab, no inc wob ABD: soft, +bs EXT: no edema SKIN: no acute rash, healing skin tx sites per derm.  Voice at baseline.  OP wnl

## 2017-11-07 DIAGNOSIS — Z0000 Encounter for general adult medical examination without abnormal findings: Secondary | ICD-10-CM | POA: Insufficient documentation

## 2017-11-07 DIAGNOSIS — Z7189 Other specified counseling: Secondary | ICD-10-CM | POA: Insufficient documentation

## 2017-11-07 DIAGNOSIS — R197 Diarrhea, unspecified: Secondary | ICD-10-CM

## 2017-11-07 DIAGNOSIS — A0471 Enterocolitis due to Clostridium difficile, recurrent: Secondary | ICD-10-CM | POA: Insufficient documentation

## 2017-11-07 NOTE — Assessment & Plan Note (Signed)
Flu 2019 Shingles 2016 PNA not due Tetanus 2014 Colonoscopy 2012 Prostate cancer screening 2019, d/w pt.  PSA wnl.   Advance directive- wife designated if patient were incapacitated.   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   HCV screening prev neg.   Hearing screen d/w pt.  Declined hearing aids.

## 2017-11-07 NOTE — Assessment & Plan Note (Signed)
Per renal, I'll defer.  He agrees.

## 2017-11-07 NOTE — Assessment & Plan Note (Signed)
Per vasc surgery.  I'll defer.  Routine cautions d/w pt.

## 2017-11-07 NOTE — Assessment & Plan Note (Signed)
No ADE on med.  No dysphagia.  Voice at baseline.  Labs d/w pt.  Compliant.

## 2017-11-07 NOTE — Assessment & Plan Note (Signed)
Using medications without problems: yes Muscle aches: no Diet compliance: encouraged.   Ex[facility]encour[facility], [facility]  Labs d/w pt.

## 2017-11-07 NOTE — Assessment & Plan Note (Addendum)
Chest pain with exertion:no, but had 1 episode with CP at rest last week.  Used NTG with relief.  No sx in the meantime.   I will update cardiology.  D/w pt about routine cautions.  >25 minutes spent in face to face time with patient, >50% spent in counselling or coordination of care .

## 2017-11-07 NOTE — Assessment & Plan Note (Signed)
He has some episodic postprandial diarrhea w/o blood in stool.  No vomiting. No fevers. No black stools.  Going on for about 1 month.  More likely if he eats at [employer] or at other restaurants.  D/w pt about monitoring diet and avoiding greasy foods.  He'll update me as needed.

## 2017-11-07 NOTE — Assessment & Plan Note (Signed)
No recent flares.  He'll update me as needed.

## 2017-11-07 NOTE — Assessment & Plan Note (Signed)
HA sig better with injections and gabapentin.  Continue as is.  He agrees.

## 2017-11-07 NOTE — Assessment & Plan Note (Signed)
Advance directive- wife designated if patient were incapacitated.  

## 2017-11-07 NOTE — Assessment & Plan Note (Signed)
Per ENT.  No alarming findings or sx.

## 2017-11-08 ENCOUNTER — Telehealth: Payer: Self-pay | Admitting: Physician Assistant

## 2017-11-08 NOTE — Telephone Encounter (Signed)
Called pt in regards to message sent to Richardson Dopp, PA from pt's PCP Dr. Elsie Stain. Per PCP pt had 1 time episode of chest pain and took NTG w/relief. Per PA message no further episodes since then. Per Richardson Dopp, PA I called and left a message for the pt to call back to follow up on how he has been feeling or if he has had anymore chest pain episodes.

## 2017-11-08 NOTE — Telephone Encounter (Signed)
-----   Message from Tonia Ghent, MD sent at 11/07/2017  8:08 AM EDT ----- No exertional CP but had 1 episode with CP at rest about two weeks ago.  Used NTG with relief.  No sx in the meantime.  This could have been esophageal, but I wanted you to know.  I gave him routine cautions.  I didn't know your threshold for getting him back in.  Thanks.   Tony Hunt

## 2017-11-08 NOTE — Telephone Encounter (Signed)
Please see note by his PCP below.  Please call patient to see how he is doing.  If he is having recurrent chest pain, please arrange earlier follow up with Dr. Acie Fredrickson or me.  Richardson Dopp, PA-C    11/08/2017 8:50 AM

## 2017-11-09 ENCOUNTER — Other Ambulatory Visit: Payer: Self-pay | Admitting: Family Medicine

## 2017-11-09 NOTE — Telephone Encounter (Signed)
See below.  I'll defer to cards.  I app help of all.

## 2017-11-09 NOTE — Telephone Encounter (Signed)
Appointment scheduled 11/16/17. Richardson Dopp, PA-C    11/09/2017 5:15 PM

## 2017-11-09 NOTE — Telephone Encounter (Signed)
PER PCP PT HAD EPISODE OF CP, TOOK NTG W/RELIEF. PT STATES SOME SOB, PT WOULD LIKE APPT TO DISCUSS ? STRESS TEST....CM[facility]

## 2017-11-15 DIAGNOSIS — R51 Headache: Secondary | ICD-10-CM | POA: Diagnosis not present

## 2017-11-15 DIAGNOSIS — M542 Cervicalgia: Secondary | ICD-10-CM | POA: Diagnosis not present

## 2017-11-15 DIAGNOSIS — G518 Other disorders of facial nerve: Secondary | ICD-10-CM | POA: Diagnosis not present

## 2017-11-15 DIAGNOSIS — M791 Myalgia, unspecified site: Secondary | ICD-10-CM | POA: Diagnosis not present

## 2017-11-16 ENCOUNTER — Ambulatory Visit: Payer: Medicare Other | Admitting: Physician Assistant

## 2017-11-16 ENCOUNTER — Ambulatory Visit (INDEPENDENT_AMBULATORY_CARE_PROVIDER_SITE_OTHER): Payer: Medicare Other | Admitting: Physician Assistant

## 2017-11-16 ENCOUNTER — Encounter: Payer: Self-pay | Admitting: Physician Assistant

## 2017-11-16 ENCOUNTER — Encounter: Payer: Self-pay | Admitting: *Deleted

## 2017-11-16 VITALS — BP 120/78 | HR 49 | Ht 65.25 in | Wt 197.1 lb

## 2017-11-16 DIAGNOSIS — I6523 Occlusion and stenosis of bilateral carotid arteries: Secondary | ICD-10-CM | POA: Diagnosis not present

## 2017-11-16 DIAGNOSIS — I2510 Atherosclerotic heart disease of native coronary artery without angina pectoris: Secondary | ICD-10-CM | POA: Diagnosis not present

## 2017-11-16 DIAGNOSIS — I10 Essential (primary) hypertension: Secondary | ICD-10-CM

## 2017-11-16 DIAGNOSIS — R0602 Shortness of breath: Secondary | ICD-10-CM

## 2017-11-16 DIAGNOSIS — R001 Bradycardia, unspecified: Secondary | ICD-10-CM | POA: Diagnosis not present

## 2017-11-16 MED ORDER — FUROSEMIDE 20 MG PO TABS: ORAL_TABLET | ORAL | 3 refills | Status: DC

## 2017-11-16 NOTE — Progress Notes (Signed)
Cardiology Office Note:    Date:  11/16/2017   ID:  Tony Hunt, DOB 13-Aug-1955, MRN [id number]  PCP:  Tonia Ghent, MD  Cardiologist:  Mertie Moores, MD   Electrophysiologist:  None  Vascular surgeon:  Dr. Trula Slade  Referring MD: Tonia Ghent, MD   Chief Complaint  Patient presents with  . Shortness of Breath     History of Present Illness:    Tony Hunt is a 62 y.o. male with coronary artery disease s/p drug eluting stent to the LAD in 2014, hypertension, hyperlipidemia, hypothyroidism, abdominal aortic aneurysm, carotid artery disease.  Cardiac Catheterization in 04/2016 demonstrated patent LAD stent.  He was last seen in July 2019.  He recently complained of chest pain to his PCP at a follow up visit.    Tony Hunt returns for evaluation of chest pain or shortness of breath.  Several weeks ago, he was sitting watching TV and had left-sided chest discomfort.  It was somewhat severe.  He eventually took nitroglycerin with relief.  He had no associated symptoms.  He has not had recurrent chest pain since that time.  However, he has gradually developed worsening shortness of breath over the past several weeks.  He mainly noted orthopnea and PND at night.  He has now noted some increased abdominal girth as well as bendopnea.  He now has developed shortness of breath with mild to moderate activities.  He denies syncope.  He has not had significant cough or weight change.  Prior CV studies:   The following studies were reviewed today:  Carotid US 08/19/17 R 60-79; L 1-39 R vertebral occluded FU 12 months  AAA [place] 03/28/17 ([facility]) Final Interpretation: Abdominal Aorta: There is evidence of abnormal dilitation of the Distal Abdominal aorta. There is evidence of abnormal dilation of the Right Common Iliac artery and Left Common Iliac artery. The largest aortic measurement is 3.0 cm. Previous diameter  measurement was 2.8 cm obtained on 03/31/2015.  Echo 08/27/16 Mild LVH, EF  55-60, no RWMA, Gr 1 DD  Cardiac Catheterization 05/28/16 LAD prox 30, mid stent patent; D1 40 LCx ok; OM2 50, 50 RCA prox 20 EF 55-65  Past Medical History:  Diagnosis Date  . AAA (abdominal aortic aneurysm) without rupture ([place]) 02/14/2014   [place] 1/19:  AAA 3 cm  . ALCOHOL ABUSE, HX OF    distant history  . Anxiety    occ panic sx, increased after death of mother  . ANXIETY DEPRESSION 05/10/2007   history of, during difficult relationship  . CAD (coronary artery disease) 08/28/2012   08/25/2012 Successful PTCA/DES x mid LAD // LAD prox 30, mid stent patent; D1 40; LCx ok; OM2 50, 50; RCA prox 20; EF 55-65   . Cancer ([facility]) 03/2010   tumor on larynx/tx radiation  . Carotid artery disease ([facility]) 09/12/2017   [place] 6/19:  R 60-79; L 1-39; R vertebral occluded; FU 12 months  . 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 Dilatation of aorta ([facility]) 02/14/2014  . ESOPHAGEAL STRICTURE 05/10/2007  . GERD 05/10/2007  . Heart disease   . HIATAL HERNIA 05/10/2007  . HYPERLIPIDEMIA 05/10/2007  . HYPERTENSION 05/10/2007  . 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 SLEEP APNEA 05/10/2007   lost 100lb-not now  . Upper airway cough syndrome    Per Dr. Melvyn Novas, pulmonary    Surgical Hx: The patient  has a past surgical history that includes Back surgery (  1997); Wrist fracture surgery (08/21/10); Colonoscopy; Appendectomy; Coronary stent placement; left heart catheterization with coronary angiogram (N/A, 08/25/2012); left heart catheterization with coronary angiogram (N/A, 11/23/2013); Laryngoscopy / bronchoscopy / esophagoscopy (2012); Carpometacarpal ([facility]) fusion of thumb (Left, 04/02/2014); and LEFT HEART CATH AND CORONARY ANGIOGRAPHY (N/A, 05/28/2016).   Current Medications: Current Meds  Medication Sig  . acetaminophen (TYLENOL) 500 MG tablet Take 500-1,000 mg by mouth daily as needed for moderate pain or headache.  Marland Kitchen aspirin EC 81 MG tablet Take 1 tablet  (81 mg total) by mouth daily.  . clopidogrel (PLAVIX) 75 MG tablet TAKE 1 TABLET DAILY WITH BREAKFAST  . cyclobenzaprine (FLEXERIL) 10 MG tablet Take 0.5-1 tablets (5-10 mg total) by mouth 3 (three) times daily as needed for muscle spasms (and for headache.  sedation caution).  . fenofibrate 160 MG tablet TAKE 1 TABLET DAILY  . fish oil-omega-3 fatty acids 1000 MG capsule Take 1 g by mouth daily.  Marland Kitchen gabapentin (NEURONTIN) 100 MG capsule 300mg  at night.  Also 100mg  in the day if needed.  Marland Kitchen levothyroxine (SYNTHROID, LEVOTHROID) 100 MCG tablet TAKE 1 TABLET DAILY BEFORE BREAKFAST  . Melatonin 10 MG TABS Take 10 mg by mouth at bedtime.  . metoprolol tartrate (LOPRESSOR) 25 MG tablet TAKE ONE-HALF (1/2) TABLET TWICE A DAY  . nitroGLYCERIN (NITROSTAT) 0.4 MG SL tablet Place 1 tablet (0.4 mg total) under the tongue every 5 (five) minutes as needed for chest pain (do not exceed 3 pills during one episode).  . ondansetron (ZOFRAN) 4 MG tablet Take 1 tablet (4 mg total) by mouth every 8 (eight) hours as needed for nausea or vomiting.  . pantoprazole (PROTONIX) 40 MG tablet TAKE 1 TABLET DAILY  . rosuvastatin (CRESTOR) 20 MG tablet Take 1 tablet (20 mg total) by mouth daily.  . tamsulosin (FLOMAX) 0.4 MG CAPS capsule TAKE 1 CAPSULE DAILY     Allergies:   Iohexol; Pravastatin; Imdur [isosorbide dinitrate]; Lisinopril; Codeine; Iodine-131; Simvastatin; and Statins   Social History   Tobacco Use  . Smoking status: Former Smoker    Last attempt to quit: 09/30/2010    Years since quitting: 7.1  . Smokeless tobacco: Former Network engineer Use Topics  . Alcohol use: Yes    Comment: 2-3 drinks a week at most  . Drug use: No     Family Hx: The patient's family history includes Brain cancer in his father; Dementia in his father; Heart disease in his brother and mother; Hyperlipidemia in his brother; Hypertension in his brother and mother. There is no history of Colon cancer or Prostate cancer.  ROS:     Please see the history of present illness.    Review of Systems  Constitution: Positive for malaise/fatigue.  Cardiovascular: Positive for dyspnea on exertion.  Respiratory: Positive for shortness of breath.    All other systems reviewed and are negative.   EKGs/Labs/Other Test Reviewed:    EKG:  EKG is   ordered today.  The ekg ordered today demonstrates sinus bradycardia, heart rate 49, inferior Q waves, incomplete right lateral branch block, QTC 404, no significant change compared to last tracing  Recent Labs: 10/24/2017: ALT 10; BUN 20; Creatinine, Ser 1.48; Hemoglobin 15.1; Platelets 261.0; Potassium 3.9; Sodium 137; TSH 2.10   Recent Lipid Panel Lab Results  Component Value Date/Time   CHOL 176 10/24/2017 10:16 AM   CHOL 195 03/16/2017 10:12 AM   TRIG 278.0 (H) 10/24/2017 10:16 AM   HDL 33.40 (L) 10/24/2017 10:16 AM  HDL 36 (L) 03/16/2017 10:12 AM   CHOLHDL 5 10/24/2017 10:16 AM   LDLCALC 112 (H) 03/16/2017 10:12 AM   LDLDIRECT 116.0 10/24/2017 10:16 AM    Physical Exam:    VS:  BP 120/78   Pulse (!) 49   Ht 5' 5.25" (1.657 m)   Wt 197 lb 1.9 oz (89.4 kg)   BMI 32.55 kg/m     Wt Readings from Last 3 Encounters:  11/16/17 197 lb 1.9 oz (89.4 kg)  11/03/17 197 lb 4 oz (89.5 kg)  10/24/17 197 lb 4 oz (89.5 kg)     Physical Exam  Constitutional: He is oriented to person, place, and time. He appears well-developed and well-nourished. No distress.  HENT:  Head: Normocephalic and atraumatic.  Eyes: No scleral icterus.  Neck: No JVD present. No thyromegaly present.  Cardiovascular: Normal rate and regular rhythm.  No murmur heard. Pulmonary/Chest: Effort normal. He has no wheezes. He has no rales.  Abdominal: He exhibits distension.  Musculoskeletal: He exhibits edema (very trace ankle edema bilat).  Lymphadenopathy:    He has no cervical adenopathy.  Neurological: He is alert and oriented to person, place, and time.  Skin: Skin is warm and dry.  Psychiatric:  He has a normal mood and affect.    ASSESSMENT & PLAN:    Coronary artery disease involving native coronary artery of native heart without angina pectoris History of drug-eluting stent to the LAD in 2014.  He had a cardiac catheterization in March 2018 with patent LAD stent and mild to moderate nonobstructive disease elsewhere.  He had recent symptoms of angina relieved by nitroglycerin.  He has not had recurrent chest symptoms since that time.  However, he is short of breath with exertion.  Most of his symptoms of shortness of breath seem to be more consistent with volume excess than angina.  His ECG does not demonstrate any significant changes.  He did have mild diastolic dysfunction on echocardiogram last year.  His weight is not significantly changed he does not have significant lower extremity swelling.  However, he does have increased abdominal girth as well as bendopnea.  He has had some mild renal insufficiency in the past.  I suspect he may be volume overloaded.  -Obtain follow-up echocardiogram  -Plan exercise Myoview  -If his EF is down, cancel Myoview and proceed with cardiac catheterization  -Start Lasix 20 mg daily x3 days, then as needed  -Follow-up 3-4 weeks after testing  Shortness of breath As noted, I suspect he is volume overloaded.  I cannot rule out ischemia as a cause for her symptoms.  Obtain echocardiogram.  If his echocardiogram demonstrates no significant change, proceed with exercise Myoview.  Start Lasix 20 mg daily.  He will take this for 3 days, then as needed.  Obtain BMET, BNP today.  If his BNP is significant elevated, I will keep him on Lasix and obtain a follow-up BMET in 1 to 2 weeks.  Essential hypertension The patient's blood pressure is controlled on his current regimen.  Continue current therapy.   Bradycardia   This is fairly chronic.  He has not been symptomatic.  As noted, he will undergo an exercise Myoview.  This will help rule out chronotropic  incompetence.   Dispo:  Return in about 4 weeks (around 12/14/2017) for Follow up after testing, w/ Dr. Acie Fredrickson, or Tony Dopp, PA-C.   Medication Adjustments/Labs and Tests Ordered: Current medicines are reviewed at length with the patient today.  Concerns regarding  medicines are outlined above.  Tests Ordered: Orders Placed This Encounter  Procedures  . Basic Metabolic Panel (BMET)  . Pro b natriuretic peptide  . Myocardial Perfusion Imaging  . EKG 12-Lead  . ECHOCARDIOGRAM COMPLETE   Medication Changes: Meds ordered this encounter  Medications  . furosemide (LASIX) 20 MG tablet    Sig: Take 1 tablet daily for 3 days then only take AS NEEDED for increased swelling or increased shortness of breath    Dispense:  30 tablet    Refill:  3    Signed, Tony Dopp, PA-C  11/16/2017 5:42 PM    [facility] [address], [place], [place]  [address] Phone: [phone]; Fax: [phone]

## 2017-11-16 NOTE — Patient Instructions (Signed)
Medication Instructions:  1. START LASIX 20 MG DAILY FOR 3 DAYS THEN ONLY TAKE AS NEEDED FOR INCREASED SWELLING OR INCREASED SHORTNESS OF BREATH  Labwork: TODAY BMET, PRO BNP  Testing/Procedures: 1. Your physician has requested that you have an echocardiogram. Echocardiography is a painless test that uses sound waves to create images of your heart. It provides your doctor with information about the size and shape of your heart and how well your heart's chambers and valves are working. This procedure takes approximately one hour. There are no restrictions for this procedure. ECHO TO BE DONE BEFORE MYOVIEW, PER SCOTT W. PAC DO NOT SCHEDULE ECHO AND MYOVIEW SAME DAY. IF ECHO ABNORMAL PT WILL BE SET UP FOR CATH AND WE WILL CANCEL THE MYOVIEW  2. Your physician has requested that you have en exercise stress myoview. For further information please visit [email or web address]. Please follow instruction sheet, as given. ECHO TO BE DONE BEFORE MYOVIEW, PER SCOTT W. PAC DO NOT SCHEDULE ECHO AND MYOVIEW SAME DAY. IF ECHO ABNORMAL PT WILL BE SET UP FOR CATH AND WE WILL CANCEL THE MYOVIEW    Follow-Up: SCOTT WEAVER, PAC IN 3-4 WEEKS SAME DAY DR. Acie Fredrickson IS IN THE OFFICE; AFTER TESTING HAS BEEN COMPLETED  Any Other Special Instructions Will Be Listed Below (If Applicable).     If you need a refill on your cardiac medications before your next appointment, please call your pharmacy.

## 2017-11-17 ENCOUNTER — Ambulatory Visit (HOSPITAL_COMMUNITY): Payer: Medicare Other | Attending: Cardiology

## 2017-11-17 ENCOUNTER — Telehealth: Payer: Self-pay | Admitting: *Deleted

## 2017-11-17 ENCOUNTER — Encounter: Payer: Self-pay | Admitting: Physician Assistant

## 2017-11-17 ENCOUNTER — Other Ambulatory Visit: Payer: Self-pay

## 2017-11-17 ENCOUNTER — Telehealth (HOSPITAL_COMMUNITY): Payer: Self-pay | Admitting: *Deleted

## 2017-11-17 DIAGNOSIS — I10 Essential (primary) hypertension: Secondary | ICD-10-CM | POA: Diagnosis not present

## 2017-11-17 DIAGNOSIS — R0602 Shortness of breath: Secondary | ICD-10-CM

## 2017-11-17 DIAGNOSIS — G4733 Obstructive sleep apnea (adult) (pediatric): Secondary | ICD-10-CM | POA: Diagnosis not present

## 2017-11-17 DIAGNOSIS — E785 Hyperlipidemia, unspecified: Secondary | ICD-10-CM | POA: Insufficient documentation

## 2017-11-17 DIAGNOSIS — Z87891 Personal history of nicotine dependence: Secondary | ICD-10-CM | POA: Diagnosis not present

## 2017-11-17 DIAGNOSIS — I2510 Atherosclerotic heart disease of native coronary artery without angina pectoris: Secondary | ICD-10-CM | POA: Diagnosis not present

## 2017-11-17 DIAGNOSIS — Z923 Personal history of irradiation: Secondary | ICD-10-CM | POA: Insufficient documentation

## 2017-11-17 LAB — BASIC METABOLIC PANEL
BUN/Creatinine Ratio: 13 (ref 10–24)
BUN: 22 mg/dL (ref 8–27)
CHLORIDE: 103 mmol/L (ref 96–106)
CO2: 20 mmol/L (ref 20–29)
Calcium: 9.4 mg/dL (ref 8.6–10.2)
Creatinine, Ser: 1.67 mg/dL — ABNORMAL HIGH (ref 0.76–1.27)
GFR calc non Af Amer: 43 mL/min/{1.73_m2} — ABNORMAL LOW (ref >59)
GFR, EST AFRICAN AMERICAN: 50 mL/min/{1.73_m2} — AB (ref >59)
GLUCOSE: 94 mg/dL (ref 65–99)
POTASSIUM: 3.9 mmol/L (ref 3.5–5.2)
Sodium: 139 mmol/L (ref 134–144)

## 2017-11-17 LAB — PRO B NATRIURETIC PEPTIDE: NT-PRO BNP: 402 pg/mL — AB (ref 0–210)

## 2017-11-17 NOTE — Telephone Encounter (Signed)
Pt has been notified of lab results by phone with verbal understanding. 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 Pt asked if he could wait until Monday to take the lasix. I advised pt I will need to check for further recommendations with Richardson Dopp, PA and call back later. Pt thanked me for the call.

## 2017-11-17 NOTE — Telephone Encounter (Signed)
I called the pt back and went over recommendation per Brynda Rim. PA, see below. Pt states he will start on Monday. I did advised pt to please call the office Thursday or Friday next week to update the PA as to how he is feeling after the 3 days of lasix. Pt is agreeable to plan of care and thanked me for the call.    Weaver, Scott T, PA-C 14 minutes ago (11:14 AM)  He can wait if it is inconvenient. But, I suspect he will notice improvement in his breathing once he starts.  He can certainly take it early in the morning to avoid issues later in the day. Richardson Dopp, PA-C    11/17/2017 11:16 AM

## 2017-11-17 NOTE — Telephone Encounter (Signed)
He can wait if it is inconvenient. But, I suspect he will notice improvement in his breathing once he starts.  He can certainly take it early in the morning to avoid issues later in the day. Richardson Dopp, PA-C    11/17/2017 11:16 AM

## 2017-11-17 NOTE — Telephone Encounter (Signed)
Patient given detailed instructions per Myocardial Perfusion Study Information Sheet for the test on 11/24/17. Patient notified to arrive 15 minutes early and that it is imperative to arrive on time for appointment to keep from having the test rescheduled.  If you need to cancel or reschedule your appointment, please call the office within 24 hours of your appointment. . Patient verbalized understanding. Demarcus Thielke Jacqueline     

## 2017-11-17 NOTE — Telephone Encounter (Signed)
-----   Message from Liliane Shi, PA-C sent at 11/17/2017 10:40 AM EDT ----- The creatinine is increased compared to 10/24/17 but overall stable when compared to historical values. The BNP is somewhat elevated.  All other results are normal or within acceptable limits. Medication changes / Follow up labs / Other changes or recommendations:    - Continue Lasix 20 mg Once daily for a total of 3 days as recommended at the office visit yesterday; then take as needed.  - Ask patient to call with an update on his symptoms after taking the Lasix for 3 days. Richardson Dopp, PA-C 11/17/2017 10:38 AM

## 2017-11-24 ENCOUNTER — Ambulatory Visit (HOSPITAL_COMMUNITY): Payer: Medicare Other | Attending: Cardiovascular Disease

## 2017-11-24 DIAGNOSIS — I2510 Atherosclerotic heart disease of native coronary artery without angina pectoris: Secondary | ICD-10-CM | POA: Insufficient documentation

## 2017-11-24 DIAGNOSIS — R0602 Shortness of breath: Secondary | ICD-10-CM | POA: Diagnosis not present

## 2017-11-24 LAB — MYOCARDIAL PERFUSION IMAGING
Estimated workload: 11.1 METS
Exercise duration (min): 10 min
Exercise duration (sec): 15 s
LV dias vol: 100 mL (ref 62–150)
LV sys vol: 42 mL
MPHR: 157 {beats}/min
Peak HR: 139 {beats}/min
Percent HR: 88 %
Rest HR: 51 {beats}/min
SDS: 0
SRS: 0
SSS: 0
TID: 1.01

## 2017-11-24 MED ORDER — TECHNETIUM TC 99M TETROFOSMIN IV KIT
31.2000 | PACK | Freq: Once | INTRAVENOUS | Status: AC | PRN
  Administered 2017-11-24: 31.2 via INTRAVENOUS
  Filled 2017-11-24: qty 32

## 2017-11-24 MED ORDER — TECHNETIUM TC 99M TETROFOSMIN IV KIT
10.2000 | PACK | Freq: Once | INTRAVENOUS | Status: AC | PRN
  Administered 2017-11-24: 10.2 via INTRAVENOUS
  Filled 2017-11-24: qty 11

## 2017-11-25 ENCOUNTER — Telehealth: Payer: Self-pay | Admitting: *Deleted

## 2017-11-25 ENCOUNTER — Encounter: Payer: Self-pay | Admitting: Physician Assistant

## 2017-11-25 NOTE — Telephone Encounter (Signed)
-----   Message from Liliane Shi, [place] sent at 11/25/2017  1:43 PM EDT ----- This study demonstrates:  Normal blood flow to the heart.    Medication changes / Follow up studies / Other recommendations:    - I reviewed his study with Dr. Acie Fredrickson.  - No further testing needed at this time.  - Continue current medications.   - Ask him how he felt with taking the Lasix for several days.  - Follow up with me as planned.  Please send results to the PCP:  Tonia Ghent, MD  Richardson Dopp, PA-C 11/25/2017 1:41 PM

## 2017-11-25 NOTE — Telephone Encounter (Signed)
Called the pt to go over Myoview results. Woman answered the phone and said the pt was not home right now. She took office # [phone] and will have the pt call the office for test results.

## 2017-12-21 ENCOUNTER — Ambulatory Visit (INDEPENDENT_AMBULATORY_CARE_PROVIDER_SITE_OTHER): Payer: Medicare Other | Admitting: Physician Assistant

## 2017-12-21 ENCOUNTER — Encounter: Payer: Self-pay | Admitting: Physician Assistant

## 2017-12-21 VITALS — BP 114/60 | HR 58 | Ht 67.0 in | Wt 198.0 lb

## 2017-12-21 DIAGNOSIS — I6523 Occlusion and stenosis of bilateral carotid arteries: Secondary | ICD-10-CM | POA: Diagnosis not present

## 2017-12-21 DIAGNOSIS — I2510 Atherosclerotic heart disease of native coronary artery without angina pectoris: Secondary | ICD-10-CM | POA: Diagnosis not present

## 2017-12-21 DIAGNOSIS — I5032 Chronic diastolic (congestive) heart failure: Secondary | ICD-10-CM | POA: Diagnosis not present

## 2017-12-21 HISTORY — DX: Chronic diastolic (congestive) heart failure: I50.32

## 2017-12-21 NOTE — Progress Notes (Signed)
Cardiology Office Note:    Date:  12/21/2017   ID:  Tony Hunt, DOB 1955/04/30, MRN [id number]  PCP:  Tonia Ghent, MD  Cardiologist:  Mertie Moores, MD  Electrophysiologist:  None  Vascular surgeon: Dr. Trula Slade Nephrologist: Dr. Moshe Cipro  Referring MD: Tonia Ghent, MD   Chief Complaint  Patient presents with  . Follow-up    CHF    History of Present Illness:    Tony Hunt is a 62 y.o. male with coronary artery diseases/p drug eluting stent to the LAD in 2014,hypertension,hyperlipidemia, hypothyroidism,abdominal aortic aneurysm, carotid artery disease, chronic kidney disease.Cardiac Catheterizationin 04/2016 demonstrated patent LAD stent.  He was last seen 11/16/2017 for the evaluation of chest pain and shortness of breath.  He describes one episode of chest discomfort at rest relieved by nitroglycerin.  He also described gradually worsening shortness of breath.  His BNP was somewhat elevated and I put him on Lasix for several days.  An echocardiogram demonstrated normal LV function with moderate diastolic dysfunction.  A nuclear stress test demonstrated a small inferobasal defect felt to be consistent with infarct but no ischemia.  The study was low risk.  I reviewed the stress test with Dr. Acie Fredrickson.  He felt that it was nearly normal and the inferior defect may be diaphragmatic attenuation.    [name] returns for follow-up on congestive heart failure.  He is here alone today.  Since last seen, he has done well.  He has not had any further chest discomfort.  He has not had any further shortness of breath.  He denies orthopnea, paroxysmal nocturnal dyspnea or lower extremity swelling.  Prior CV studies:   The following studies were reviewed today:  Echo 11/17/2017 Mild concentric LVH, EF 65-70, normal wall motion, grade 2 diastolic dysfunction, mild LAE, normal RVSF, trivial TR  Nuclear stress test 11/24/2017 EF 58, small inferobasal infarct, no ischemia, low  risk  Carotid US 08/19/17 R 60-79; L 1-39 R vertebral occluded FU 12 months  AAA [place] 03/28/17 ([facility]) Final Interpretation: Abdominal Aorta: There is evidence of abnormal dilitation of the Distal Abdominal aorta. There is evidence of abnormal dilation of the Right Common Iliac artery and Left Common Iliac artery. The largest aortic measurement is 3.0 cm. Previous diameter measurement was 2.8 cm obtained on 03/31/2015.  Echo 08/27/16 Mild LVH, EF 55-60, no RWMA, Gr 1 DD  Cardiac Catheterization3/30/18 LAD prox 30, mid stent patent; D1 40 LCx ok; OM2 50, 50 RCA prox 20 EF 55-65  Past Medical History:  Diagnosis Date  . AAA (abdominal aortic aneurysm) without rupture ([place]) 02/14/2014   [place] 1/19:  AAA 3 cm  . ALCOHOL ABUSE, HX OF    distant history  . Anxiety    occ panic sx, increased after death of mother  . ANXIETY DEPRESSION 05/10/2007   history of, during difficult relationship  . CAD (coronary artery disease) 08/28/2012   08/25/2012 Successful PTCA/DES x mid LAD // LAD prox 30, mid stent patent; D1 40; LCx ok; OM2 50, 50; RCA prox 20; EF 55-65  // Nuclear stress test 9/19: EF 58, small inferobasal infarct, no ischemia, Low Risk (inf defect ? diaph atten?)   . 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 Carotid artery disease ([place]) 09/12/2017   [place] 6/19:  R 60-79; L 1-39; R vertebral occluded; FU 12 months  . 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 Chronic diastolic CHF (  congestive heart failure) ([place]) 12/21/2017   Echo 11/17/2017 - Mild concentric LVH, EF 65-70, normal wall motion, grade 2 diastolic dysfunction, mild LAE, normal RVSF, trivial TR  . Dilatation of aorta ([facility]) 02/14/2014  . ESOPHAGEAL STRICTURE 05/10/2007  . GERD 05/10/2007  . Heart disease   . HIATAL HERNIA 05/10/2007  . History of echocardiogram    Echo 9/19: mild conc LVH, vigorous LVF, EF 65-70, Gr 2 DD, mild LAE, normal RVSF, trivial AI  .  HYPERLIPIDEMIA 05/10/2007  . HYPERTENSION 05/10/2007  . 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 SLEEP APNEA 05/10/2007   lost 100lb-not now  . Upper airway cough syndrome    Per Dr. Melvyn Novas, pulmonary    Surgical Hx: The patient  has a past surgical history that includes Back surgery (1997); Wrist fracture surgery (08/21/10); Colonoscopy; Appendectomy; Coronary stent placement; left heart catheterization with coronary angiogram (N/A, 08/25/2012); left heart catheterization with coronary angiogram (N/A, 11/23/2013); Laryngoscopy / bronchoscopy / esophagoscopy (2012); Carpometacarpal (cmc) fusion of thumb (Left, 04/02/2014); and LEFT HEART CATH AND CORONARY ANGIOGRAPHY (N/A, 05/28/2016).   Current Medications: Current Meds  Medication Sig  . acetaminophen (TYLENOL) 500 MG tablet Take 500-1,000 mg by mouth daily as needed for moderate pain or headache.  Marland Kitchen aspirin EC 81 MG tablet Take 1 tablet (81 mg total) by mouth daily.  . clopidogrel (PLAVIX) 75 MG tablet Take 75 mg by mouth daily.  . fenofibrate 160 MG tablet Take 160 mg by mouth daily.  . fish oil-omega-3 fatty acids 1000 MG capsule Take 1 g by mouth daily.  . furosemide (LASIX) 20 MG tablet Take 20 mg by mouth as needed for fluid.  Marland Kitchen gabapentin (NEURONTIN) 100 MG capsule Take 100 mg by mouth as needed (for headache).  . gabapentin (NEURONTIN) 300 MG capsule Take 300 mg by mouth at bedtime.   Marland Kitchen levothyroxine (SYNTHROID, LEVOTHROID) 100 MCG tablet TAKE 1 TABLET DAILY BEFORE BREAKFAST  . Melatonin 10 MG TABS Take 10 mg by mouth at bedtime.  . metoprolol tartrate (LOPRESSOR) 25 MG tablet TAKE ONE-HALF (1/2) TABLET TWICE A DAY  . nitroGLYCERIN (NITROSTAT) 0.4 MG SL tablet Place 1 tablet (0.4 mg total) under the tongue every 5 (five) minutes as needed for chest pain (do not exceed 3 pills during one episode).  . ondansetron (ZOFRAN) 4 MG tablet Take 1 tablet (4 mg total) by mouth every 8 (eight) hours as needed for nausea or vomiting.  . pantoprazole  (PROTONIX) 40 MG tablet TAKE 1 TABLET DAILY  . rosuvastatin (CRESTOR) 20 MG tablet Take 1 tablet (20 mg total) by mouth daily.  . tamsulosin (FLOMAX) 0.4 MG CAPS capsule TAKE 1 CAPSULE DAILY  . [DISCONTINUED] clopidogrel (PLAVIX) 75 MG tablet TAKE 1 TABLET DAILY WITH BREAKFAST  . [DISCONTINUED] cyclobenzaprine (FLEXERIL) 10 MG tablet Take 0.5-1 tablets (5-10 mg total) by mouth 3 (three) times daily as needed for muscle spasms (and for headache.  sedation caution).  . [DISCONTINUED] fenofibrate 160 MG tablet TAKE 1 TABLET DAILY  . [DISCONTINUED] furosemide (LASIX) 20 MG tablet Take 1 tablet daily for 3 days then only take AS NEEDED for increased swelling or increased shortness of breath (Patient taking differently: Take 1 tablet by mouth daily for 3 days then only take AS NEEDED for increased swelling or increased shortness of breath)  . [DISCONTINUED] gabapentin (NEURONTIN) 100 MG capsule 300mg  at night.  Also 100mg  in the day if needed. (Patient taking differently: 300mg  at night.  Also 100mg  in the day if needed for  headache)     Allergies:   Imdur [isosorbide dinitrate]; Iohexol; Lisinopril; Pravastatin; Codeine; Iodine-131; Simvastatin; and Statins   Social History   Tobacco Use  . Smoking status: Former Smoker    Last attempt to quit: 09/30/2010    Years since quitting: 7.2  . Smokeless tobacco: Former Network engineer Use Topics  . Alcohol use: Yes    Comment: 2-3 drinks a week at most  . Drug use: No     Family Hx: The patient's family history includes Brain cancer in his father; Dementia in his father; Heart disease in his brother and mother; Hyperlipidemia in his brother; Hypertension in his brother and mother. There is no history of Colon cancer or Prostate cancer.  ROS:   Please see the history of present illness.    Review of Systems  Respiratory: Positive for cough.    All other systems reviewed and are negative.   EKGs/Labs/Other Test Reviewed:    EKG:  EKG is not  ordered today.    Recent Labs: 10/24/2017: ALT 10; Hemoglobin 15.1; Platelets 261.0; TSH 2.10 11/16/2017: BUN 22; Creatinine, Ser 1.67; NT-Pro BNP 402; Potassium 3.9; Sodium 139   Recent Lipid Panel Lab Results  Component Value Date/Time   CHOL 176 10/24/2017 10:16 AM   CHOL 195 03/16/2017 10:12 AM   TRIG 278.0 (H) 10/24/2017 10:16 AM   HDL 33.40 (L) 10/24/2017 10:16 AM   HDL 36 (L) 03/16/2017 10:12 AM   CHOLHDL 5 10/24/2017 10:16 AM   LDLCALC 112 (H) 03/16/2017 10:12 AM   LDLDIRECT 116.0 10/24/2017 10:16 AM    Physical Exam:    VS:  BP 114/60   Pulse (!) 58   Ht 5\' 7"  (1.702 m)   Wt 198 lb (89.8 kg)   SpO2 98%   BMI 31.01 kg/m     Wt Readings from Last 3 Encounters:  12/21/17 198 lb (89.8 kg)  11/24/17 197 lb (89.4 kg)  11/16/17 197 lb 1.9 oz (89.4 kg)     Physical Exam  Constitutional: He is oriented to person, place, and time. He appears well-developed and well-nourished. No distress.  HENT:  Head: Normocephalic and atraumatic.  Eyes: No scleral icterus.  Neck: No JVD present.  Cardiovascular: Normal rate and regular rhythm.  No murmur heard. Pulmonary/Chest: Effort normal. He has no rales.  Abdominal: Soft.  Musculoskeletal: He exhibits no edema.  Neurological: He is alert and oriented to person, place, and time.  Skin: Skin is warm and dry.    ASSESSMENT & PLAN:    Chronic diastolic CHF (congestive heart failure) (HCC) Recent echo with EF 65-70% and grade 2 diastolic dysfunction.  NYHA 2.  Volume status is stable.  We discussed the importance of daily weights and when to take as needed Lasix.  Coronary artery disease involving native coronary artery of native heart without angina pectoris History of DES to the LAD in 2014.  Recent nuclear stress test low risk with no ischemia.  He denies any further chest discomfort.  Continue current medical regimen which includes aspirin, Plavix, Toprol rosuvastatin.   Dispo:  Return in about 6 months (around 06/22/2018)  for Routine Follow Up, w/ Dr. Acie Fredrickson, or Richardson Dopp, PA-C.   Medication Adjustments/Labs and Tests Ordered: Current medicines are reviewed at length with the patient today.  Concerns regarding medicines are outlined above.  Tests Ordered: No orders of the defined types were placed in this encounter.  Medication Changes: No orders of the defined types were placed in this  encounter.   Signed, Richardson Dopp, PA-C  12/21/2017 9:30 AM    [facility] [address], [place], [place]  [address] Phone: [phone]; Fax: [phone]

## 2017-12-21 NOTE — Patient Instructions (Signed)
Medication Instructions:  Your physician recommends that you continue on your current medications as directed. Please refer to the Current Medication list given to you today.  If you need a refill on your cardiac medications before your next appointment, please call your pharmacy.   Lab work: NONE ORDERED TODAY If you have labs (blood work) drawn today and your tests are completely normal, you will receive your results only by: Marland Kitchen MyChart Message (if you have MyChart) OR . A paper copy in the mail If you have any lab test that is abnormal or we need to change your treatment, we will call you to review the results.  Testing/Procedures: NONE ORDERED TODAY  Follow-Up: At [facility], you and your health needs are our priority.  As part of our continuing mission to provide you with exceptional heart care, we have created designated Provider Care Teams.  These Care Teams include your primary Cardiologist (physician) and Advanced Practice Providers (APPs -  Physician Assistants and Nurse Practitioners) who all work together to provide you with the care you need, when you need it. You will need a follow up appointment in:  6 months.  Please call our office 2 months in advance to schedule this appointment.  You may see Mertie Moores, MD or [facility], [facility]  one of the following Advanced Practice Providers on your designated Care Team: Richardson Dopp, PA-C [place], [place] . Daune Perch, NP  Any Other Special Instructions Will Be Listed Below (If Applicable).

## 2017-12-22 DIAGNOSIS — J342 Deviated nasal septum: Secondary | ICD-10-CM | POA: Diagnosis not present

## 2017-12-22 DIAGNOSIS — H9313 Tinnitus, bilateral: Secondary | ICD-10-CM | POA: Diagnosis not present

## 2017-12-22 DIAGNOSIS — Z7289 Other problems related to lifestyle: Secondary | ICD-10-CM | POA: Diagnosis not present

## 2017-12-22 DIAGNOSIS — Z87891 Personal history of nicotine dependence: Secondary | ICD-10-CM | POA: Diagnosis not present

## 2017-12-22 DIAGNOSIS — Z8521 Personal history of malignant neoplasm of larynx: Secondary | ICD-10-CM | POA: Diagnosis not present

## 2018-01-11 DIAGNOSIS — M791 Myalgia, unspecified site: Secondary | ICD-10-CM | POA: Diagnosis not present

## 2018-01-11 DIAGNOSIS — M542 Cervicalgia: Secondary | ICD-10-CM | POA: Diagnosis not present

## 2018-01-11 DIAGNOSIS — R51 Headache: Secondary | ICD-10-CM | POA: Diagnosis not present

## 2018-01-11 DIAGNOSIS — G518 Other disorders of facial nerve: Secondary | ICD-10-CM | POA: Diagnosis not present

## 2018-03-06 ENCOUNTER — Other Ambulatory Visit: Payer: Self-pay | Admitting: Physician Assistant

## 2018-03-13 DIAGNOSIS — M791 Myalgia, unspecified site: Secondary | ICD-10-CM | POA: Diagnosis not present

## 2018-03-13 DIAGNOSIS — M542 Cervicalgia: Secondary | ICD-10-CM | POA: Diagnosis not present

## 2018-03-13 DIAGNOSIS — G518 Other disorders of facial nerve: Secondary | ICD-10-CM | POA: Diagnosis not present

## 2018-03-13 DIAGNOSIS — R51 Headache: Secondary | ICD-10-CM | POA: Diagnosis not present

## 2018-04-05 ENCOUNTER — Other Ambulatory Visit: Payer: Self-pay | Admitting: Cardiovascular Disease

## 2018-04-07 ENCOUNTER — Other Ambulatory Visit: Payer: Self-pay | Admitting: Family Medicine

## 2018-04-10 ENCOUNTER — Encounter: Payer: Self-pay | Admitting: Family Medicine

## 2018-04-10 ENCOUNTER — Ambulatory Visit (INDEPENDENT_AMBULATORY_CARE_PROVIDER_SITE_OTHER): Payer: Medicare Other | Admitting: Family Medicine

## 2018-04-10 VITALS — BP 120/70 | HR 52 | Temp 97.7°F | Ht 67.0 in

## 2018-04-10 DIAGNOSIS — R4586 Emotional lability: Secondary | ICD-10-CM

## 2018-04-10 DIAGNOSIS — R252 Cramp and spasm: Secondary | ICD-10-CM

## 2018-04-10 DIAGNOSIS — R197 Diarrhea, unspecified: Secondary | ICD-10-CM

## 2018-04-10 LAB — CBC WITH DIFFERENTIAL/PLATELET
Basophils Absolute: 0 10*3/uL (ref 0.0–0.1)
Basophils Relative: 0.9 % (ref 0.0–3.0)
Eosinophils Absolute: 0.2 10*3/uL (ref 0.0–0.7)
Eosinophils Relative: 3 % (ref 0.0–5.0)
HCT: 44 % (ref 39.0–52.0)
Hemoglobin: 15.1 g/dL (ref 13.0–17.0)
Lymphocytes Relative: 25.6 % (ref 12.0–46.0)
Lymphs Abs: 1.4 10*3/uL (ref 0.7–4.0)
MCHC: 34.3 g/dL (ref 30.0–36.0)
MCV: 91.5 fl (ref 78.0–100.0)
Monocytes Absolute: 0.6 10*3/uL (ref 0.1–1.0)
Monocytes Relative: 10.2 % (ref 3.0–12.0)
NEUTROS ABS: 3.2 10*3/uL (ref 1.4–7.7)
Neutrophils Relative %: 60.3 % (ref 43.0–77.0)
PLATELETS: 281 10*3/uL (ref 150.0–400.0)
RBC: 4.8 Mil/uL (ref 4.22–5.81)
RDW: 13.7 % (ref 11.5–15.5)
WBC: 5.4 10*3/uL (ref 4.0–10.5)

## 2018-04-10 LAB — COMPREHENSIVE METABOLIC PANEL
ALT: 13 U/L (ref 0–53)
AST: 19 U/L (ref 0–37)
Albumin: 4 g/dL (ref 3.5–5.2)
Alkaline Phosphatase: 35 U/L — ABNORMAL LOW (ref 39–117)
BUN: 20 mg/dL (ref 6–23)
CHLORIDE: 106 meq/L (ref 96–112)
CO2: 24 meq/L (ref 19–32)
Calcium: 9.3 mg/dL (ref 8.4–10.5)
Creatinine, Ser: 1.5 mg/dL (ref 0.40–1.50)
GFR: 47.14 mL/min — ABNORMAL LOW (ref >60.00)
Glucose, Bld: 103 mg/dL — ABNORMAL HIGH (ref 70–99)
Potassium: 4.3 mEq/L (ref 3.5–5.1)
Sodium: 139 mEq/L (ref 135–145)
Total Bilirubin: 0.4 mg/dL (ref 0.2–1.2)
Total Protein: 6.6 g/dL (ref 6.0–8.3)

## 2018-04-10 LAB — TSH: TSH: 1.85 u[IU]/mL (ref 0.35–4.50)

## 2018-04-10 LAB — LIPASE: Lipase: 23 U/L (ref 11.0–59.0)

## 2018-04-10 MED ORDER — GABAPENTIN 600 MG PO TABS: 600.0000 mg | ORAL_TABLET | Freq: Every day | ORAL | Status: DC

## 2018-04-10 NOTE — Patient Instructions (Signed)
Go to the lab on the way out.  We'll contact you with your lab report. We'll go from there.  Take care.  Glad to see you.  

## 2018-04-10 NOTE — Progress Notes (Signed)
Usually has a normal BM in the AM.  Then diarrhea after eating, within 27min of eating breakfast/lunch/dinner.  Sometimes a snack causes the sx.  Going on for about 4-6 weeks, "I kept putting it off."  No blood in stool.  No vomiting.  No nausea.  He has some bloating after eating, better after BM.  No abd pain. No fevers, no chills. No night sweats.  No change with food variation, it only seems to be induced with larger snack or meal.    Scales are out of order at clinic but he doesn't have weight loss on home check, usually ~200 lbs still and that is steady.    No greasy stools.  No one else is sick.  Watery diarrhea.  No new meds.  ===================== Prev colonoscopy with mild diverticulosis.   ===================== Prev CT with Lower chest:  Unremarkable  Hepatobiliary: No focal abnormality in the liver on this study without intravenous contrast. Tiny calcified gallstones evident. No intrahepatic or extrahepatic biliary dilation.  Pancreas: No focal mass lesion. No dilatation of the main duct. No intraparenchymal cyst. No peripancreatic edema.  Spleen: No splenomegaly. No focal mass lesion.  Adrenals/Urinary Tract: No adrenal nodule or mass. Kidneys unremarkable. No evidence for hydroureter. The urinary bladder appears normal for the degree of distention.  Stomach/Bowel: Stomach is nondistended. No gastric wall thickening. No evidence of outlet obstruction. Duodenum is normally positioned as is the ligament of Treitz. No small bowel wall thickening. No small bowel dilatation. The terminal ileum is normal. No gross colonic mass. No colonic wall thickening. No substantial diverticular change.  Vascular/Lymphatic: There is abdominal aortic atherosclerosis infrarenal abdominal aortic diameter measuring up to 3.2 cm. Similar dilation of the common iliac arteries bilaterally measuring up to about 2.1 cm maximum diameter. There is no gastrohepatic or hepatoduodenal ligament  lymphadenopathy. No intraperitoneal or retroperitoneal lymphadenopathy. No pelvic sidewall lymphadenopathy.  Reproductive: The prostate gland and seminal vesicles have normal imaging features.  Other: No intraperitoneal free fluid.  Musculoskeletal: Bone windows reveal no worrisome lytic or sclerotic osseous lesions. L5-S1 spondylolisthesis is similar to prior. =====================  He has been more tearful with sentimental/happy/sad triggers or events, TV shows.  Going on for several months.  "I'm a tough guy" and this is atypical for the patient. He typically doesn't cry at funerals.  No SI/HI.    He has some muscle cramping in his neck, unclear if exacerbated by diarrhea and relative dehydration.    PMH and SH reviewed  ROS: Per HPI unless specifically indicated in ROS section   Meds, vitals, and allergies reviewed.   GEN: nad, alert and oriented HEENT: mucous membranes moist,  he has neck pain on range of motion but not a stiff neck  NECK: supple w/o LA CV: rrr PULM: ctab, no inc wob ABD: soft, +bs, not ttp EXT: no edema SKIN: no acute rash

## 2018-04-13 ENCOUNTER — Other Ambulatory Visit: Payer: Self-pay | Admitting: Family Medicine

## 2018-04-13 DIAGNOSIS — R4586 Emotional lability: Secondary | ICD-10-CM | POA: Insufficient documentation

## 2018-04-13 DIAGNOSIS — R197 Diarrhea, unspecified: Secondary | ICD-10-CM

## 2018-04-13 DIAGNOSIS — R252 Cramp and spasm: Secondary | ICD-10-CM | POA: Insufficient documentation

## 2018-04-13 NOTE — Assessment & Plan Note (Signed)
He has been more tearful with sentimental/happy/sad triggers or events, TV shows.  Going on for several months.  "I'm a tough guy" and this is atypical for the patient. He typically doesn't cry at funerals.  No SI/HI.  I want to consider options in the meantime and we need to work-up his diarrhea, see above.

## 2018-04-13 NOTE — Assessment & Plan Note (Signed)
Unclear if this is exacerbated by diarrhea.  See notes on labs.

## 2018-04-13 NOTE — Assessment & Plan Note (Signed)
Reasonable to check routine labs today.  He does have a history of gallstones previously noted.  Previous colonoscopy with mild diverticulosis.  At this point still okay for outpatient follow-up. >25 minutes spent in face to face time with patient, >50% spent in counselling or coordination of care.

## 2018-04-14 ENCOUNTER — Encounter: Payer: Self-pay | Admitting: *Deleted

## 2018-04-19 ENCOUNTER — Telehealth: Payer: Self-pay

## 2018-04-19 MED ORDER — TIZANIDINE HCL 4 MG PO TABS: 4.0000 mg | ORAL_TABLET | Freq: Four times a day (QID) | ORAL | 0 refills | Status: DC | PRN

## 2018-04-19 NOTE — Telephone Encounter (Signed)
Agree with him seeing GI.  Thanks for checking on that.  I would try tizanidine and see if that helps the spasms in his neck.  Sedation cation.

## 2018-04-19 NOTE — Telephone Encounter (Signed)
Pt notified as instructed and voiced understanding. Pt will ck with pharmacy later today and pt has appt to see GI on 04/21/18 at 10:30. Pt will cb if needed. FYI to Dr Damita [place]ings.

## 2018-04-19 NOTE — Telephone Encounter (Signed)
Pt was last seen 04/10/18; pt said still having watery diarrhea after he eats and feeling bloated after he eats. First thing this morning pt had normal BM. In last 24 hrs had watery diarrhea x 3 after eating. Pt has not taken temp but feels warm so not sure if has fever. No N&V. Also pt continues with bad cramping and spasms in neck which has been going on for a month or more and pt request muscle relaxant to [employer] [place]. I spoke with [facility] and she will call [facility] and pt will get cb. FYI to Dr Damita [place].

## 2018-04-19 NOTE — Telephone Encounter (Signed)
Thanks

## 2018-04-21 ENCOUNTER — Ambulatory Visit (INDEPENDENT_AMBULATORY_CARE_PROVIDER_SITE_OTHER): Payer: Medicare Other | Admitting: Internal Medicine

## 2018-04-21 ENCOUNTER — Encounter: Payer: Self-pay | Admitting: Internal Medicine

## 2018-04-21 ENCOUNTER — Other Ambulatory Visit (INDEPENDENT_AMBULATORY_CARE_PROVIDER_SITE_OTHER): Payer: Medicare Other

## 2018-04-21 VITALS — BP 112/64 | HR 73 | Ht 67.0 in | Wt 205.0 lb

## 2018-04-21 DIAGNOSIS — R197 Diarrhea, unspecified: Secondary | ICD-10-CM | POA: Diagnosis not present

## 2018-04-21 DIAGNOSIS — R140 Abdominal distension (gaseous): Secondary | ICD-10-CM | POA: Diagnosis not present

## 2018-04-21 DIAGNOSIS — R194 Change in bowel habit: Secondary | ICD-10-CM | POA: Diagnosis not present

## 2018-04-21 LAB — IGA: IgA: 127 mg/dL (ref 68–378)

## 2018-04-21 MED ORDER — METRONIDAZOLE 250 MG PO TABS: 250.0000 mg | ORAL_TABLET | Freq: Three times a day (TID) | ORAL | 0 refills | Status: DC

## 2018-04-21 NOTE — Progress Notes (Signed)
HISTORY OF PRESENT ILLNESS:  Tony Hunt is a 63 y.o. male, part-time special events staff personnel, with history of coronary artery disease with prior stent placement on Plavix he was sent today by his primary care provider Dr. Damita Dunnings regarding problems with chronic bloating and new onset diarrhea.  Patient underwent colonoscopy remotely in 2002.  This was normal except for mild sigmoid diverticulosis and internal hemorrhoids.  He was evaluated in this office approximately 18 months ago regarding chronic abdominal bloating.  At that time his bowel habits were regular.  His evaluation included laboratories, imaging, and upper endoscopy as well as gastric emptying scan-- all of which were unrevealing.  He was provided reassurance and weight reduction recommended.  Patient tells me that he continued in this state of health, without weight reduction, until approximately 2 months ago when he abruptly noticed a change in his bowel habits.  Typically has a formed bowel movement first thing in the morning.  However, thereafter after meals he notices bloating with urgency followed by loose stools.  He has approximately 3 bowel movements per day compared to one formed bowel movement as his baseline.  No bleeding.  No weight loss.  No nocturnal symptoms.  He denies change in diet.  His only new medication is gabapentin for headaches which he began approximately 6 months ago.  He denies the use of sugar substitutes.  No significant alcohol use.  He does have a history of GERD and has been maintained on pantoprazole chronically.  No active GERD symptoms.  Upper endoscopy November 2018 was essentially normal.  Review of outside laboratories from April 10, 2018 reveals unremarkable comprehensive metabolic panel.  Normal CBC with hemoglobin 15.1.  Normal TSH.  CT scan of the abdomen and pelvis September 2018 revealed no acute abnormalities.  Left-sided diverticulosis noted.  REVIEW OF SYSTEMS:  All non-GI ROS negative  unless otherwise stated in the HPI except for muscle cramps, fatigue, shortness of breath  Past Medical History:  Diagnosis Date  . AAA (abdominal aortic aneurysm) without rupture ([place]) 02/14/2014   [place] 1/19:  AAA 3 cm  . ALCOHOL ABUSE, HX OF    distant history  . Anxiety    occ panic sx, increased after death of mother  . ANXIETY DEPRESSION 05/10/2007   history of, during difficult relationship  . CAD (coronary artery disease) 08/28/2012   08/25/2012 Successful PTCA/DES x mid LAD // LAD prox 30, mid stent patent; D1 40; LCx ok; OM2 50, 50; RCA prox 20; EF 55-65  // Nuclear stress test 9/19: EF 58, small inferobasal infarct, no ischemia, Low Risk (inf defect ? diaph atten?)   . 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 Carotid artery disease ([place]) 09/12/2017   [place] 6/19:  R 60-79; L 1-39; R vertebral occluded; FU 12 months  . 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 Chronic diastolic CHF (congestive heart failure) ([place]) 12/21/2017   Echo 11/17/2017 - Mild concentric LVH, EF 65-70, normal wall motion, grade 2 diastolic dysfunction, mild LAE, normal RVSF, trivial TR  . Dilatation of aorta ([facility]) 02/14/2014  . ESOPHAGEAL STRICTURE 05/10/2007  . GERD 05/10/2007  . Heart disease   . HIATAL HERNIA 05/10/2007  . History of echocardiogram    Echo 9/19: mild conc LVH, vigorous LVF, EF 65-70, Gr 2 DD, mild LAE, normal RVSF, trivial AI  . HYPERLIPIDEMIA 05/10/2007  . HYPERTENSION 05/10/2007  . 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  SLEEP APNEA 05/10/2007   lost 100lb-not now  . Upper airway cough syndrome    Per Dr. Melvyn Novas, pulmonary     Past Surgical History:  Procedure Laterality Date  . APPENDECTOMY    . BACK SURGERY  1997   lower back x3  . CARPOMETACARPAL ([facility]) FUSION OF THUMB Left 04/02/2014   Procedure: CARPOMETACARPAL ([place]) FUSION OF THUMB/LEFT THUMB INTERPHALNGEAL JOINT FUSION;  Surgeon: Jolyn Nap, MD;  Location:  [place];  Service: Orthopedics;  Laterality: Left;  . COLONOSCOPY    . CORONARY STENT PLACEMENT     2.75 x 12 mm Promus Premier DES was deployed in the mid LAD. The stent was post-dilated with a 3.0 x 9 mm [place] balloon - Mid LAD.  Marland Kitchen LARYNGOSCOPY / BRONCHOSCOPY / ESOPHAGOSCOPY  2012   bx  . LEFT HEART CATH AND CORONARY ANGIOGRAPHY N/A 05/28/2016   Procedure: Left Heart Cath and Coronary Angiography;  Surgeon: Belva Crome, MD;  Location: [place] [facility];  Service: Cardiovascular;  Laterality: N/A;  . LEFT HEART CATHETERIZATION WITH CORONARY ANGIOGRAM N/A 08/25/2012   Procedure: LEFT HEART CATHETERIZATION WITH CORONARY ANGIOGRAM;  Surgeon: Burnell Blanks, MD;  Location: [facility] CATH LAB;  Service: Cardiovascular;  Laterality: N/A;  . LEFT HEART CATHETERIZATION WITH CORONARY ANGIOGRAM N/A 11/23/2013   Procedure: LEFT HEART CATHETERIZATION WITH CORONARY ANGIOGRAM;  Surgeon: Jettie Booze, MD;  Location: [facility] CATH LAB;  Service: Cardiovascular;  Laterality: N/A;  . WRIST FRACTURE SURGERY  08/21/10   Right wrist x4    Social History TOWNSEND CUDWORTH  reports that he quit smoking about 7 years ago. He has quit using smokeless tobacco. He reports current alcohol use. He reports that he does not use drugs.  family history includes Brain cancer in his father; Dementia in his father; Heart disease in his brother and mother; Hyperlipidemia in his brother; Hypertension in his brother and mother.  Allergies  Allergen Reactions  . Imdur [Isosorbide Dinitrate] Other (See Comments)    Headache at 60mg  dose, but able to tolerate 30mg  per day  . Iohexol Anaphylaxis and Other (See Comments)    OK with 13 hour prep  . Lisinopril Other (See Comments)    Elevated cr- improved off med  . Pravastatin Nausea Only and Other (See Comments)    Intense headache  . Codeine Itching  . Iodine-131 Other (See Comments)    Passed out Passed out  . Simvastatin Other (See Comments)    Edema  .  Statins Other (See Comments)    Sig edema on simvastatin       PHYSICAL EXAMINATION: Vital signs: BP 112/64   Pulse 73   Ht 5\' 7"  (1.702 m)   Wt 205 lb (93 kg)   SpO2 97%   BMI 32.11 kg/m   Constitutional: generally well-appearing, no acute distress Psychiatric: alert and oriented x3, cooperative Eyes: extraocular movements intact, anicteric, conjunctiva pink Mouth: oral pharynx moist, no lesions Neck: supple no lymphadenopathy Cardiovascular: heart regular rate and rhythm, no murmur Lungs: clear to auscultation bilaterally Abdomen: soft, obese, nontender, nondistended, no obvious ascites, no peritoneal signs, normal bowel sounds, no organomegaly Rectal: Omitted Extremities: no clubbing, cyanosis, or lower extremity edema bilaterally Skin: no lesions on visible extremities Neuro: No focal deficits.  Cranial nerves intact  ASSESSMENT:  1.  Chronic bloating.  Ongoing. 2.  Change in bowel habits manifested by increased frequency with postprandial urgency and loose stools.  Rule out bacterial overgrowth.  Rule out postinfectious  IBS.  Rule out celiac disease 3.  Colonoscopy 2002 with diverticulosis 4.  GERD.  Asymptomatic on PPI.  Essentially negative EGD November 2018 5.  Multiple medical problems including coronary artery disease, on Plavix 6.  Obesity  PLAN:  1.  Screen for celiac disease with tissue transglutaminase antibody IgA, and serum IgA level 2.  Empiric treatment with metronidazole 250 mg 3 times daily for 2 weeks 3.  Exercise and weight loss 4.  Office follow-up in 4 weeks.  If the patient symptoms continue could consider colonoscopy with biopsies to rule out microscopic colitis  Copy of this consultation note has been sent to Dr. Damita Dunnings

## 2018-04-21 NOTE — Patient Instructions (Addendum)
Your provider has requested that you go to the basement level for lab work before leaving today. Press "B" on the elevator. The lab is located at the first door on the left as you exit the elevator.  We have sent the following medications to your pharmacy for you to pick up at your convenience:  Flagyl  Please follow up on 06/01/2018 at 10:45am

## 2018-04-24 ENCOUNTER — Other Ambulatory Visit: Payer: Self-pay | Admitting: Cardiovascular Disease

## 2018-04-24 LAB — TISSUE TRANSGLUTAMINASE, IGA: (tTG) Ab, IgA: 1 U/mL

## 2018-04-28 ENCOUNTER — Other Ambulatory Visit (INDEPENDENT_AMBULATORY_CARE_PROVIDER_SITE_OTHER): Payer: Medicare Other

## 2018-04-28 DIAGNOSIS — R197 Diarrhea, unspecified: Secondary | ICD-10-CM | POA: Diagnosis not present

## 2018-04-28 LAB — FECAL OCCULT BLOOD, IMMUNOCHEMICAL: Fecal Occult Bld: NEGATIVE

## 2018-05-08 DIAGNOSIS — M542 Cervicalgia: Secondary | ICD-10-CM | POA: Diagnosis not present

## 2018-05-08 DIAGNOSIS — R51 Headache: Secondary | ICD-10-CM | POA: Diagnosis not present

## 2018-05-08 DIAGNOSIS — M791 Myalgia, unspecified site: Secondary | ICD-10-CM | POA: Diagnosis not present

## 2018-05-08 DIAGNOSIS — G518 Other disorders of facial nerve: Secondary | ICD-10-CM | POA: Diagnosis not present

## 2018-05-29 ENCOUNTER — Encounter: Payer: Self-pay | Admitting: *Deleted

## 2018-06-01 ENCOUNTER — Ambulatory Visit (INDEPENDENT_AMBULATORY_CARE_PROVIDER_SITE_OTHER): Payer: Medicare Other | Admitting: Internal Medicine

## 2018-06-01 ENCOUNTER — Other Ambulatory Visit: Payer: Self-pay

## 2018-06-01 ENCOUNTER — Encounter: Payer: Self-pay | Admitting: Internal Medicine

## 2018-06-01 DIAGNOSIS — R197 Diarrhea, unspecified: Secondary | ICD-10-CM

## 2018-06-01 DIAGNOSIS — R194 Change in bowel habit: Secondary | ICD-10-CM

## 2018-06-01 DIAGNOSIS — R140 Abdominal distension (gaseous): Secondary | ICD-10-CM | POA: Diagnosis not present

## 2018-06-01 MED ORDER — METRONIDAZOLE 250 MG PO TABS: 250.0000 mg | ORAL_TABLET | Freq: Three times a day (TID) | ORAL | 1 refills | Status: AC

## 2018-06-01 NOTE — Addendum Note (Signed)
Addended by: Larina Bras on: 06/01/2018 01:18 PM   Modules accepted: Orders

## 2018-06-01 NOTE — Patient Instructions (Signed)
We will continue to observe your condition for now.  We have sent the following medications to your pharmacy for you to pick up at your convenience: Metronidazole 250 mg three times daily x 2 weeks with 1 additional refill. You may use this if your symptoms return significantly.  Please call our office should your symptoms return or fail to improve with medication.  If problems persist or worsen, we will consider colonoscopy with biopsies to rule out microscopic colitis as previously discussed.

## 2018-06-01 NOTE — Progress Notes (Signed)
HISTORY OF PRESENT ILLNESS:  Tony Hunt is a 63 y.o. male, part-time special events staff personnel, with multiple medical problems as listed below who was evaluated April 21, 2018 regarding chronic bloating, change in bowel habits associated with increased frequency, urgency, and loose stools.  See that dictation for details.  Testing for celiac disease was performed and returned negative or normal.  He was advised with regards to weight loss and empirically treated with metronidazole 250 mg 3 times daily for 2 weeks.  Follow-up at this time planned.  Due to the coronavirus pandemic his in person office encounter was changed to a telemedicine visit at his request.  Patient tells me that after completing therapy he felt much better for approximately 2 weeks.  However he has noticed slight increase in symptoms more recently.  Specifically with the bloating component.  In terms of bowel frequency, urgency, and diarrhea he states this is probably 60% better.  He has no new problems.  No bleeding.  We did discuss previously the prospects of colonoscopy with biopsies to rule out microscopic colitis.  His last colonoscopy was performed remotely in 2012.  This was normal except for internal hemorrhoids and sigmoid diverticulosis.  REVIEW OF SYSTEMS:  All non-GI ROS negative unless otherwise stated in the HPI except for anxiety  Past Medical History:  Diagnosis Date  . AAA (abdominal aortic aneurysm) without rupture ([place]) 02/14/2014   [place] 1/19:  AAA 3 cm  . ALCOHOL ABUSE, HX OF    distant history  . Anxiety    occ panic sx, increased after death of mother  . ANXIETY DEPRESSION 05/10/2007   history of, during difficult relationship  . CAD (coronary artery disease) 08/28/2012   08/25/2012 Successful PTCA/DES x mid LAD // LAD prox 30, mid stent patent; D1 40; LCx ok; OM2 50, 50; RCA prox 20; EF 55-65  // Nuclear stress test 9/19: EF 58, small inferobasal infarct, no ischemia, Low Risk (inf defect ? diaph  atten?)   . 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 Carotid artery disease ([place]) 09/12/2017   [place] 6/19:  R 60-79; L 1-39; R vertebral occluded; FU 12 months  . 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 Chronic diastolic CHF (congestive heart failure) ([place]) 12/21/2017   Echo 11/17/2017 - Mild concentric LVH, EF 65-70, normal wall motion, grade 2 diastolic dysfunction, mild LAE, normal RVSF, trivial TR  . Dilatation of aorta ([facility]) 02/14/2014  . Diverticulosis   . ESOPHAGEAL STRICTURE 05/10/2007  . GERD 05/10/2007  . Heart disease   . HIATAL HERNIA 05/10/2007  . History of echocardiogram    Echo 9/19: mild conc LVH, vigorous LVF, EF 65-70, Gr 2 DD, mild LAE, normal RVSF, trivial AI  . HYPERLIPIDEMIA 05/10/2007  . HYPERTENSION 05/10/2007  . 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 SLEEP APNEA 05/10/2007   lost 100lb-not now  . Upper airway cough syndrome    Per Dr. Melvyn Novas, pulmonary     Past Surgical History:  Procedure Laterality Date  . APPENDECTOMY    . BACK SURGERY  1997   lower back x3  . CARPOMETACARPAL ([facility]) FUSION OF THUMB Left 04/02/2014   Procedure: CARPOMETACARPAL ([place]) FUSION OF THUMB/LEFT THUMB INTERPHALNGEAL JOINT FUSION;  Surgeon: Jolyn Nap, MD;  Location: [place];  Service: Orthopedics;  Laterality: Left;  . COLONOSCOPY    . CORONARY STENT PLACEMENT     2.75 x 12  mm Promus Premier DES was deployed in the mid LAD. The stent was post-dilated with a 3.0 x 9 mm [place] balloon - Mid LAD.  Marland Kitchen LARYNGOSCOPY / BRONCHOSCOPY / ESOPHAGOSCOPY  2012   bx  . LEFT HEART CATH AND CORONARY ANGIOGRAPHY N/A 05/28/2016   Procedure: Left Heart Cath and Coronary Angiography;  Surgeon: Belva Crome, MD;  Location: [place] [facility];  Service: Cardiovascular;  Laterality: N/A;  . LEFT HEART CATHETERIZATION WITH CORONARY ANGIOGRAM N/A 08/25/2012   Procedure: LEFT HEART CATHETERIZATION WITH CORONARY ANGIOGRAM;   Surgeon: Burnell Blanks, MD;  Location: [facility] CATH LAB;  Service: Cardiovascular;  Laterality: N/A;  . LEFT HEART CATHETERIZATION WITH CORONARY ANGIOGRAM N/A 11/23/2013   Procedure: LEFT HEART CATHETERIZATION WITH CORONARY ANGIOGRAM;  Surgeon: Jettie Booze, MD;  Location: [facility] CATH LAB;  Service: Cardiovascular;  Laterality: N/A;  . WRIST FRACTURE SURGERY  08/21/10   Right wrist x4    Social History Tony Hunt  reports that he quit smoking about 7 years ago. He has quit using smokeless tobacco. He reports current alcohol use. He reports that he does not use drugs.  family history includes Brain cancer in his father; Colon polyps in his brother; Dementia in his father; Heart disease in his brother and mother; Hyperlipidemia in his brother; Hypertension in his brother and mother.  Allergies  Allergen Reactions  . Imdur [Isosorbide Dinitrate] Other (See Comments)    Headache at 60mg  dose, but able to tolerate 30mg  per day  . Iohexol Anaphylaxis and Other (See Comments)    OK with 13 hour prep  . Lisinopril Other (See Comments)    Elevated cr- improved off med  . Pravastatin Nausea Only and Other (See Comments)    Intense headache  . Codeine Itching  . Iodine-131 Other (See Comments)    Passed out Passed out  . Simvastatin Other (See Comments)    Edema  . Statins Other (See Comments)    Sig edema on simvastatin       PHYSICAL EXAMINATION: Telemedicine visit.  Thus, no physical exam    ASSESSMENT:  1.  Problems with chronic bloating.  Ongoing but somewhat improved after antibiotics.  Question bacterial overgrowth versus functional bloating 2.  Problems with change in bowel habits with increased frequency, urgency, and loose stools.  Improved after course of metronidazole.  Possible bacterial overgrowth 3.  Colonoscopy 2012 with diverticulosis   PLAN:  1.  Ongoing observation for the time being 2.  Prescribe metronidazole 250 mg 3 times daily for 2 weeks; #42; 1  refill.  The patient may use this if his symptoms return significantly.  He will keep me posted. 3.  If problems persist or worsen then we will consider colonoscopy with biopsies to rule out microscopic colitis as previously discussed  This telemedicine visit was between the patient (in his home) and myself (in my office) at his request.  He provided consent.  He understands it may be associated charges for the service.  The encounter was 15 minutes in duration.  A copy of this note has been sent to his primary provider Dr. Damita Dunnings as well as my CMA Dixon Boos to execute the orders as above.

## 2018-06-22 ENCOUNTER — Telehealth: Payer: Self-pay | Admitting: Cardiovascular Disease

## 2018-06-22 NOTE — Telephone Encounter (Signed)
Received verbal consent for virtual visit.

## 2018-06-22 NOTE — Telephone Encounter (Signed)
Spoke with patient who confirmed all demographics. Patient does not have a smart phone.  He has a computer so I sent him a link for My Chart.  He said that he would try to get in later because his children were on it. Will have vitals ready for visit.

## 2018-07-07 ENCOUNTER — Other Ambulatory Visit: Payer: Self-pay | Admitting: Physician Assistant

## 2018-07-10 DIAGNOSIS — M791 Myalgia, unspecified site: Secondary | ICD-10-CM | POA: Diagnosis not present

## 2018-07-10 DIAGNOSIS — M542 Cervicalgia: Secondary | ICD-10-CM | POA: Diagnosis not present

## 2018-07-10 DIAGNOSIS — R51 Headache: Secondary | ICD-10-CM | POA: Diagnosis not present

## 2018-07-10 DIAGNOSIS — G518 Other disorders of facial nerve: Secondary | ICD-10-CM | POA: Diagnosis not present

## 2018-07-12 ENCOUNTER — Telehealth: Payer: Medicare Other | Admitting: Cardiovascular Disease

## 2018-07-13 ENCOUNTER — Telehealth: Payer: Self-pay

## 2018-07-13 NOTE — Telephone Encounter (Signed)
Left message for pt to call back about upcoming appt. We are having to move the appt due to scheduling changes.  

## 2018-07-21 ENCOUNTER — Telehealth: Payer: Medicare Other | Admitting: Cardiovascular Disease

## 2018-08-01 ENCOUNTER — Telehealth: Payer: Self-pay

## 2018-08-01 ENCOUNTER — Encounter: Payer: Self-pay | Admitting: Family Medicine

## 2018-08-01 ENCOUNTER — Ambulatory Visit: Payer: Medicare Other | Admitting: Family Medicine

## 2018-08-01 ENCOUNTER — Ambulatory Visit (INDEPENDENT_AMBULATORY_CARE_PROVIDER_SITE_OTHER): Payer: Medicare Other | Admitting: Family Medicine

## 2018-08-01 ENCOUNTER — Other Ambulatory Visit: Payer: Self-pay

## 2018-08-01 VITALS — BP 102/70 | HR 64 | Temp 98.2°F | Ht 67.0 in | Wt 201.0 lb

## 2018-08-01 DIAGNOSIS — S70261A Insect bite (nonvenomous), right hip, initial encounter: Secondary | ICD-10-CM

## 2018-08-01 DIAGNOSIS — R739 Hyperglycemia, unspecified: Secondary | ICD-10-CM

## 2018-08-01 DIAGNOSIS — W57XXXA Bitten or stung by nonvenomous insect and other nonvenomous arthropods, initial encounter: Secondary | ICD-10-CM

## 2018-08-01 DIAGNOSIS — R202 Paresthesia of skin: Secondary | ICD-10-CM | POA: Diagnosis not present

## 2018-08-01 NOTE — Telephone Encounter (Signed)
Noted. Thanks.

## 2018-08-01 NOTE — Patient Instructions (Addendum)
Go to the lab on the way out.  We'll contact you with your lab report. Stop crestor in the meantime and see if the cramps get better.  Take care.  Glad to see you.

## 2018-08-01 NOTE — Progress Notes (Signed)
Pandemic considerations d/w pt.  B hand tingling and pain from the distal forearms to the fingertips.  Similar in the B legs from the mid shin distally.  Sx started about 2-3 weeks ago, initially intermittent, persistent now.  Grip is normal but his balance is less good, possibly from not feeling the floor as well.    He has B anterior neck cramping but not at the same time, along either SCM.  No posterior cramping.  No other cramping otherwise.  He has posterior neck pain at baseline.    No FCNAVD.  No falls, no trauma.  Some episodic blurry vision in B eyes.  It self resolved.  Would last about 30 minutes at a time, then resolved.  Noted last week.  No vision loss.    No CP.  No BLE edema.  He doesn't feel like he has his prev exercise tolerance recently.  No polyuria.  He drinks plenty of water at baseline.  This is not different from previous.  He had a tick bite a few months ago.  No bullseye rash noted previously by patient.    Sugar 166 here today.  He wasn't fasting.    Rare etoh.    PMH and SH reviewed  ROS: Per HPI unless specifically indicated in ROS section   Meds, vitals, and allergies reviewed.   nad ncat MMM OP wnl Neck supple, no LA rrr ctab abd soft, nontender. normal bowel sounds Cranial nerves II-XII intact bilaterally. Normal sensation to light tough, monofilament and vibration on the extremities x4 Normals strength but flat DTRs x4 Symmetric exam overall. Normal cerebellar testing normal Romberg normal heel-to-toe testing. Skin with one lesion noted.  R hip area- possible tick bite site with some mild hyperpigmentation that appears to be a residual change.

## 2018-08-01 NOTE — Telephone Encounter (Signed)
Triage patient for the following concerns:   CC:  Bilateral numbness and tingling, burning to hands and feet accompanied with intermittent neck cramping X 2 weeks.   He denies any CP, SOB, radiating pains to jaw, or through to shoulder blade. Negative for any neurologic symptoms and states his in home VS checks have been normal with bp's running 125-130/70-80.    He states the cramping does not always occur with movement and can hit him out of the blue.    He does state that his balance has been off for the past couple of days as the bilateral numbness/tingling in his feet has progressed.    He denies any dizziness but has noted some bilateral blurred vision over past couple of weeks, unsure of relation.  Otherwise no confusion and no sudden loss of movement or feeling to any part of his body.   Patient screened negative for COVID related symptoms and no recent known exposure.    I did consult with Dr. Damita Dunnings and he will see patient this afternoon in the office to evaluate further.   Patient instructed to wear a mask and made aware of office social distancing instructions.   Patient verbalizes understanding.

## 2018-08-02 ENCOUNTER — Other Ambulatory Visit: Payer: Self-pay | Admitting: Family Medicine

## 2018-08-02 ENCOUNTER — Encounter: Payer: Self-pay | Admitting: Family Medicine

## 2018-08-02 DIAGNOSIS — R202 Paresthesia of skin: Secondary | ICD-10-CM | POA: Insufficient documentation

## 2018-08-02 DIAGNOSIS — R748 Abnormal levels of other serum enzymes: Secondary | ICD-10-CM

## 2018-08-02 LAB — CBC WITH DIFFERENTIAL/PLATELET
Basophils Absolute: 0 10*3/uL (ref 0.0–0.1)
Basophils Relative: 0.6 % (ref 0.0–3.0)
Eosinophils Absolute: 0.2 10*3/uL (ref 0.0–0.7)
Eosinophils Relative: 2.5 % (ref 0.0–5.0)
HCT: 42.6 % (ref 39.0–52.0)
Hemoglobin: 14.6 g/dL (ref 13.0–17.0)
Lymphocytes Relative: 23.5 % (ref 12.0–46.0)
Lymphs Abs: 1.5 10*3/uL (ref 0.7–4.0)
MCHC: 34.2 g/dL (ref 30.0–36.0)
MCV: 93.4 fl (ref 78.0–100.0)
Monocytes Absolute: 0.5 10*3/uL (ref 0.1–1.0)
Monocytes Relative: 7.9 % (ref 3.0–12.0)
Neutro Abs: 4.3 10*3/uL (ref 1.4–7.7)
Neutrophils Relative %: 65.5 % (ref 43.0–77.0)
Platelets: 277 10*3/uL (ref 150.0–400.0)
RBC: 4.56 Mil/uL (ref 4.22–5.81)
RDW: 13.6 % (ref 11.5–15.5)
WBC: 6.5 10*3/uL (ref 4.0–10.5)

## 2018-08-02 LAB — COMPREHENSIVE METABOLIC PANEL
ALT: 13 U/L (ref 0–53)
AST: 21 U/L (ref 0–37)
Albumin: 4 g/dL (ref 3.5–5.2)
Alkaline Phosphatase: 35 U/L — ABNORMAL LOW (ref 39–117)
BUN: 24 mg/dL — ABNORMAL HIGH (ref 6–23)
CO2: 26 mEq/L (ref 19–32)
Calcium: 9.6 mg/dL (ref 8.4–10.5)
Chloride: 104 mEq/L (ref 96–112)
Creatinine, Ser: 1.83 mg/dL — ABNORMAL HIGH (ref 0.40–1.50)
GFR: 37.44 mL/min — ABNORMAL LOW (ref >60.00)
Glucose, Bld: 114 mg/dL — ABNORMAL HIGH (ref 70–99)
Potassium: 4.4 mEq/L (ref 3.5–5.1)
Sodium: 139 mEq/L (ref 135–145)
Total Bilirubin: 0.5 mg/dL (ref 0.2–1.2)
Total Protein: 6.5 g/dL (ref 6.0–8.3)

## 2018-08-02 LAB — HEMOGLOBIN A1C: Hgb A1c MFr Bld: 5.9 % (ref 4.6–6.5)

## 2018-08-02 LAB — TSH: TSH: 1.26 u[IU]/mL (ref 0.35–4.50)

## 2018-08-02 LAB — SEDIMENTATION RATE: Sed Rate: 20 mm/hr (ref 0–20)

## 2018-08-02 LAB — CK: Total CK: 292 U/L — ABNORMAL HIGH (ref 7–232)

## 2018-08-02 LAB — VITAMIN B12: Vitamin B-12: 164 pg/mL — ABNORMAL LOW (ref 211–911)

## 2018-08-02 NOTE — Assessment & Plan Note (Signed)
He has a symmetric exam.  He has subjective paresthesias with intact sensation.  He has flat but symmetric deep tendon reflexes in the bilateral upper and lower extremities.  Stocking glove neuropathy differential diagnosis discussed with patient.  Could be related to thyroid disease, B12, diabetes, etc.  Sugar is mildly elevated but he is not fasting today and he does not have typical symptoms for diabetes.  Check routine labs.  No change in meds at this point.  See notes on labs.  He agrees to plan.  He also has some muscle cramping that appears to be along the sternocleidomastoids bilaterally.  No symptoms at time of exam.  Check CK, see notes on labs.  Unclear if this is related to the above.  >25 minutes spent in face to face time with patient, >50% spent in counselling or coordination of care.

## 2018-08-03 ENCOUNTER — Telehealth: Payer: Self-pay

## 2018-08-03 LAB — B. BURGDORFI ANTIBODIES BY WB

## 2018-08-03 LAB — ROCKY MTN SPOTTED FVR ABS PNL(IGG+IGM)
RMSF IgG: NOT DETECTED
RMSF IgM: NOT DETECTED

## 2018-08-03 NOTE — Telephone Encounter (Signed)
Pt left v/m that his hands are hurting and pt request cb about recent lab results.

## 2018-08-03 NOTE — Telephone Encounter (Signed)
Spoke with patient.

## 2018-08-04 ENCOUNTER — Telehealth: Payer: Self-pay

## 2018-08-04 NOTE — Telephone Encounter (Signed)
Left message for patient to call back about lab results.

## 2018-08-04 NOTE — Telephone Encounter (Signed)
Patient is calling back about lab results. Please advise. Thank you

## 2018-08-07 ENCOUNTER — Other Ambulatory Visit (INDEPENDENT_AMBULATORY_CARE_PROVIDER_SITE_OTHER): Payer: Medicare Other

## 2018-08-07 ENCOUNTER — Ambulatory Visit (INDEPENDENT_AMBULATORY_CARE_PROVIDER_SITE_OTHER): Payer: Medicare Other

## 2018-08-07 DIAGNOSIS — R748 Abnormal levels of other serum enzymes: Secondary | ICD-10-CM | POA: Diagnosis not present

## 2018-08-07 DIAGNOSIS — E538 Deficiency of other specified B group vitamins: Secondary | ICD-10-CM | POA: Diagnosis not present

## 2018-08-07 LAB — BASIC METABOLIC PANEL
BUN: 19 mg/dL (ref 6–23)
CO2: 27 mEq/L (ref 19–32)
Calcium: 9.1 mg/dL (ref 8.4–10.5)
Chloride: 106 mEq/L (ref 96–112)
Creatinine, Ser: 1.51 mg/dL — ABNORMAL HIGH (ref 0.40–1.50)
GFR: 46.74 mL/min — ABNORMAL LOW (ref >60.00)
Glucose, Bld: 88 mg/dL (ref 70–99)
Potassium: 4.2 mEq/L (ref 3.5–5.1)
Sodium: 139 mEq/L (ref 135–145)

## 2018-08-07 LAB — CK: Total CK: 127 U/L (ref 7–232)

## 2018-08-07 MED ORDER — CYANOCOBALAMIN 1000 MCG/ML IJ SOLN: INTRAMUSCULAR | 0 refills | Status: DC

## 2018-08-07 MED ORDER — "SYRINGE/NEEDLE (DISP) 25G X 1"" 3 ML MISC": 3 refills | Status: DC

## 2018-08-07 MED ORDER — CYANOCOBALAMIN 1000 MCG/ML IJ SOLN: INTRAMUSCULAR | 1 refills | Status: DC

## 2018-08-07 MED ORDER — CYANOCOBALAMIN 1000 MCG/ML IJ SOLN
1000.0000 ug | Freq: Once | INTRAMUSCULAR | Status: AC
  Administered 2018-08-07: 1000 ug via INTRAMUSCULAR

## 2018-08-07 NOTE — Telephone Encounter (Signed)
Called both numbers and left a message to call back 

## 2018-08-07 NOTE — Telephone Encounter (Signed)
Patient is here for B-12 teaching.  Lab results reported.

## 2018-08-07 NOTE — Progress Notes (Signed)
Patient in with wife today for Vitamin B12 training with this Probation officer, Magdalen Spatz, RN.    Spent approximately 30 minutes with patient and wife educating on administration technique.  Also gave patient handouts for reinforcement at home.    After practicing drawing up of medication and administration to a dummy arm, wife was able to appropriately withdraw from vial and administer office supplied Vitamin B12 to patient's right deltoid IM.  Patient tolerated well and wife verbalizes comfort with procedure.  She knows that she can return next week for another practice run if need be for comfort level.  She will call and let me know if she has any concerns.   Vial of B12 and needles/syringes sent in to local pharmacy for next week and a 3 months supply sent in to mail order [facility] for thereafter at patient's request.

## 2018-08-10 ENCOUNTER — Telehealth: Payer: Self-pay | Admitting: *Deleted

## 2018-08-10 MED ORDER — TIZANIDINE HCL 4 MG PO TABS: 4.0000 mg | ORAL_TABLET | Freq: Four times a day (QID) | ORAL | 0 refills | Status: DC | PRN

## 2018-08-10 NOTE — Telephone Encounter (Signed)
Patient says he is still having these cramps/spasms in the front his neck that are very painful and actually draws his neck downward.  Patient asks if there is anything he can do to help alleviate that?  Patient says he spoke with you about this at his last OV. Please advise.

## 2018-08-10 NOTE — Telephone Encounter (Signed)
Did he try stopping crestor?  Did that help at all? If he didn't stop crestor, then try that first.   If he already stopped med w/o relief, then okay to restart crestor.  Then would use tizanidine if needed for spasms.   rx sent.   If that doesn't help then let me know.  Thanks.

## 2018-08-10 NOTE — Telephone Encounter (Signed)
Patient has not taken the Crestor since his last visit with you and there has been no improvement in the spasms.  Patient advised that he can restart the Crestor and that Tizanidine has been sent to his pharmacy and let us know if doesn't help.

## 2018-08-17 ENCOUNTER — Telehealth: Payer: Self-pay

## 2018-08-17 NOTE — Telephone Encounter (Signed)
Patient calls in to report difficulty getting his needles and syringes from the pharmacies.  His mail order gave him one but he was unable to get anything from [facility] which was where I sent the order in to.  I called [facility] to discuss the problem.   They did not think they had the correct size available for patient. But when speaking back with the pharmacist he was able to do a search and found the correct size.   He is filling per our original order and will have ready for patient.   Patient is aware that unless he hears back from me, he is to check in with the pharmacy tomorrow to pick up needed supplies.

## 2018-08-21 ENCOUNTER — Other Ambulatory Visit (HOSPITAL_COMMUNITY): Payer: Self-pay | Admitting: Cardiovascular Disease

## 2018-08-21 ENCOUNTER — Ambulatory Visit (HOSPITAL_COMMUNITY)
Admission: RE | Admit: 2018-08-21 | Discharge: 2018-08-21 | Disposition: A | Discharge status: Home / Self Care | Payer: Medicare Other | Source: Ambulatory Visit | Attending: Cardiovascular Disease | Admitting: Cardiovascular Disease

## 2018-08-21 ENCOUNTER — Other Ambulatory Visit: Payer: Self-pay

## 2018-08-21 DIAGNOSIS — I6523 Occlusion and stenosis of bilateral carotid arteries: Secondary | ICD-10-CM | POA: Diagnosis not present

## 2018-08-25 ENCOUNTER — Telehealth: Payer: Self-pay | Admitting: *Deleted

## 2018-08-25 DIAGNOSIS — I779 Disorder of arteries and arterioles, unspecified: Secondary | ICD-10-CM

## 2018-08-25 NOTE — Telephone Encounter (Signed)
-----   Message from Thayer Headings, MD sent at 08/22/2018  6:11 PM EDT ----- Mild - mod carotid disease. Repeat in 12 months

## 2018-09-08 ENCOUNTER — Encounter: Payer: Self-pay | Admitting: Family Medicine

## 2018-09-08 ENCOUNTER — Other Ambulatory Visit: Payer: Self-pay

## 2018-09-08 ENCOUNTER — Ambulatory Visit (INDEPENDENT_AMBULATORY_CARE_PROVIDER_SITE_OTHER): Payer: Medicare Other | Admitting: Family Medicine

## 2018-09-08 VITALS — BP 118/74 | HR 59 | Temp 97.8°F | Ht 67.0 in | Wt 198.4 lb

## 2018-09-08 DIAGNOSIS — I6523 Occlusion and stenosis of bilateral carotid arteries: Secondary | ICD-10-CM | POA: Diagnosis not present

## 2018-09-08 DIAGNOSIS — R1032 Left lower quadrant pain: Secondary | ICD-10-CM | POA: Diagnosis not present

## 2018-09-08 DIAGNOSIS — K921 Melena: Secondary | ICD-10-CM

## 2018-09-08 LAB — COMPREHENSIVE METABOLIC PANEL
ALT: 11 U/L (ref 0–53)
AST: 15 U/L (ref 0–37)
Albumin: 3.9 g/dL (ref 3.5–5.2)
Alkaline Phosphatase: 31 U/L — ABNORMAL LOW (ref 39–117)
BUN: 22 mg/dL (ref 6–23)
CO2: 27 mEq/L (ref 19–32)
Calcium: 9.3 mg/dL (ref 8.4–10.5)
Chloride: 107 mEq/L (ref 96–112)
Creatinine, Ser: 1.65 mg/dL — ABNORMAL HIGH (ref 0.40–1.50)
GFR: 42.18 mL/min — ABNORMAL LOW (ref >60.00)
Glucose, Bld: 90 mg/dL (ref 70–99)
Potassium: 4 mEq/L (ref 3.5–5.1)
Sodium: 141 mEq/L (ref 135–145)
Total Bilirubin: 0.5 mg/dL (ref 0.2–1.2)
Total Protein: 6 g/dL (ref 6.0–8.3)

## 2018-09-08 LAB — LIPASE: Lipase: 30 U/L (ref 11.0–59.0)

## 2018-09-08 LAB — CBC WITH DIFFERENTIAL/PLATELET
Basophils Absolute: 0 10*3/uL (ref 0.0–0.1)
Basophils Relative: 0.7 % (ref 0.0–3.0)
Eosinophils Absolute: 0.2 10*3/uL (ref 0.0–0.7)
Eosinophils Relative: 3.3 % (ref 0.0–5.0)
HCT: 38.6 % — ABNORMAL LOW (ref 39.0–52.0)
Hemoglobin: 13.1 g/dL (ref 13.0–17.0)
Lymphocytes Relative: 22.6 % (ref 12.0–46.0)
Lymphs Abs: 1.2 10*3/uL (ref 0.7–4.0)
MCHC: 34 g/dL (ref 30.0–36.0)
MCV: 92.2 fl (ref 78.0–100.0)
Monocytes Absolute: 0.4 10*3/uL (ref 0.1–1.0)
Monocytes Relative: 8.6 % (ref 3.0–12.0)
Neutro Abs: 3.3 10*3/uL (ref 1.4–7.7)
Neutrophils Relative %: 64.8 % (ref 43.0–77.0)
Platelets: 294 10*3/uL (ref 150.0–400.0)
RBC: 4.18 Mil/uL — ABNORMAL LOW (ref 4.22–5.81)
RDW: 13.3 % (ref 11.5–15.5)
WBC: 5.1 10*3/uL (ref 4.0–10.5)

## 2018-09-08 MED ORDER — METRONIDAZOLE 500 MG PO TABS: 500.0000 mg | ORAL_TABLET | Freq: Three times a day (TID) | ORAL | 0 refills | Status: DC

## 2018-09-08 MED ORDER — CIPROFLOXACIN HCL 250 MG PO TABS: 250.0000 mg | ORAL_TABLET | Freq: Two times a day (BID) | ORAL | 0 refills | Status: DC

## 2018-09-08 NOTE — Patient Instructions (Signed)
I am suspicious for diverticulitis as well. Labs today. Start antibiotics sent to pharmacy. I would like to refer you to GI after you are feeling better. We will call you for appointment.  If worsening with vomiting, fever, abdominal pain, go to ER over the weekend. If no better on Monday, call us for CT scan.   Diverticulitis  Diverticulitis is infection or inflammation of small pouches (diverticula) in the colon that form due to a condition called diverticulosis. Diverticula can trap stool (feces) and bacteria, causing infection and inflammation. Diverticulitis may cause severe stomach pain and diarrhea. It may lead to tissue damage in the colon that causes bleeding. The diverticula may also burst (rupture) and cause infected stool to enter other areas of the abdomen. Complications of diverticulitis can include:  Bleeding.  Severe infection.  Severe pain.  Rupture (perforation) of the colon.  Blockage (obstruction) of the colon. What are the causes? This condition is caused by stool becoming trapped in the diverticula, which allows bacteria to grow in the diverticula. This leads to inflammation and infection. What increases the risk? You are more likely to develop this condition if:  You have diverticulosis. The risk for diverticulosis increases if: ? You are overweight or obese. ? You use tobacco products. ? You do not get enough exercise.  You eat a diet that does not include enough fiber. High-fiber foods include fruits, vegetables, beans, nuts, and whole grains. What are the signs or symptoms? Symptoms of this condition may include:  Pain and tenderness in the abdomen. The pain is normally located on the left side of the abdomen, but it may occur in other areas.  Fever and chills.  Bloating.  Cramping.  Nausea.  Vomiting.  Changes in bowel routines.  Blood in your stool. How is this diagnosed? This condition is diagnosed based on:  Your medical history.  A  physical exam.  Tests to make sure there is nothing else causing your condition. These tests may include: ? Blood tests. ? Urine tests. ? Imaging tests of the abdomen, including X-rays, ultrasounds, MRIs, or CT scans. How is this treated? Most cases of this condition are mild and can be treated at home. Treatment may include:  Taking over-the-counter pain medicines.  Following a clear liquid diet.  Taking antibiotic medicines by mouth.  Rest. More severe cases may need to be treated at a hospital. Treatment may include:  Not eating or drinking.  Taking prescription pain medicine.  Receiving antibiotic medicines through an IV tube.  Receiving fluids and nutrition through an IV tube.  Surgery. When your condition is under control, your health care provider may recommend that you have a colonoscopy. This is an exam to look at the entire large intestine. During the exam, a lubricated, bendable tube is inserted into the anus and then passed into the rectum, colon, and other parts of the large intestine. A colonoscopy can show how severe your diverticula are and whether something else may be causing your symptoms. Follow these instructions at home: Medicines  Take over-the-counter and prescription medicines only as told by your health care provider. These include fiber supplements, probiotics, and stool softeners.  If you were prescribed an antibiotic medicine, take it as told by your health care provider. Do not stop taking the antibiotic even if you start to feel better.  Do not drive or use heavy machinery while taking prescription pain medicine. General instructions   Follow a full liquid diet or another diet as directed by your  health care provider. After your symptoms improve, your health care provider may tell you to change your diet. He or she may recommend that you eat a diet that contains at least 25 g (25 grams) of fiber daily. Fiber makes it easier to pass stool. Healthy  sources of fiber include: ? Berries. One cup contains 4-8 grams of fiber. ? Beans or lentils. One half cup contains 5-8 grams of fiber. ? Green vegetables. One cup contains 4 grams of fiber.  Exercise for at least 30 minutes, 3 times each week. You should exercise hard enough to raise your heart rate and break a sweat.  Keep all follow-up visits as told by your health care provider. This is important. You may need a colonoscopy. Contact a health care provider if:  Your pain does not improve.  You have a hard time drinking or eating food.  Your bowel movements do not return to normal. Get help right away if:  Your pain gets worse.  Your symptoms do not get better with treatment.  Your symptoms suddenly get worse.  You have a fever.  You vomit more than one time.  You have stools that are bloody, black, or tarry. Summary  Diverticulitis is infection or inflammation of small pouches (diverticula) in the colon that form due to a condition called diverticulosis. Diverticula can trap stool (feces) and bacteria, causing infection and inflammation.  You are at higher risk for this condition if you have diverticulosis and you eat a diet that does not include enough fiber.  Most cases of this condition are mild and can be treated at home. More severe cases may need to be treated at a hospital.  When your condition is under control, your health care provider may recommend that you have an exam called a colonoscopy. This exam can show how severe your diverticula are and whether something else may be causing your symptoms. This information is not intended to replace advice given to you by your health care provider. Make sure you discuss any questions you have with your health care provider. Document Released: 11/25/2004 Document Revised: 01/28/2017 Document Reviewed: 03/20/2016 Elsevier Patient Education  2020 [employer].

## 2018-09-08 NOTE — Progress Notes (Signed)
This visit was conducted in person.  BP 118/74 (BP Location: Left Arm, Patient Position: Sitting, Cuff Size: Normal)   Pulse (!) 59   Temp 97.8 F (36.6 C) (Temporal)   Ht 5\' 7"  (1.702 m)   Wt 198 lb 7 oz (90 kg)   SpO2 97%   BMI 31.08 kg/m    CC: abd pain Subjective:    Patient ID: Tony Hunt, male    DOB: [birth year], 63 y.o.   MRN: [id number]  HPI: Tony Hunt is a 63 y.o. male presenting on 09/08/2018 for Diverticulitis (C/o episode of diverticulitis, h/o.  Flare started 09/01/18, worsening.  Seeing blood in stool. )   1 wk h/o diffuse abd discomfort and distension, blood in the stool and commode.  No appetite yesterday. No fevers/chills. No urinary changes. No nausea/vomiting.  Increased weakness the last few days.  3 lb weight loss in the last week.   H/o diverticulitis several times in the past - has presented like this.   Colonoscopy 2012 - mild diverticulosis and int hemorrhoids.  Has dye allergy.      Relevant past medical, surgical, family and social history reviewed and updated as indicated. Interim medical history since our last visit reviewed. Allergies and medications reviewed and updated. Outpatient Medications Prior to Visit  Medication Sig Dispense Refill  . aspirin EC 81 MG tablet Take 1 tablet (81 mg total) by mouth daily. 90 tablet 3  . clopidogrel (PLAVIX) 75 MG tablet TAKE 1 TABLET DAILY WITH BREAKFAST 90 tablet 2  . cyanocobalamin (,VITAMIN B-12,) 1000 MCG/ML injection Inject weekly X 4 weeks and then monthly thereafter. Injection start date 08/07/18. 10 mL 1  . fenofibrate 160 MG tablet TAKE 1 TABLET DAILY 90 tablet 2  . fish oil-omega-3 fatty acids 1000 MG capsule Take 1 g by mouth daily.    Marland Kitchen gabapentin (NEURONTIN) 100 MG capsule Take 100 mg by mouth as needed (for headache).    . gabapentin (NEURONTIN) 600 MG tablet Take 1 tablet (600 mg total) by mouth at bedtime.    Marland Kitchen levothyroxine (SYNTHROID, LEVOTHROID) 100 MCG tablet TAKE 1 TABLET DAILY  BEFORE BREAKFAST 90 tablet 1  . Melatonin 10 MG TABS Take 10 mg by mouth at bedtime.    . metoprolol tartrate (LOPRESSOR) 25 MG tablet Take 0.5 tablets (12.5 mg total) by mouth 2 (two) times daily. 30 tablet 0  . nitroGLYCERIN (NITROSTAT) 0.4 MG SL tablet Place 1 tablet (0.4 mg total) under the tongue every 5 (five) minutes as needed for chest pain (do not exceed 3 pills during one episode). 25 tablet 6  . pantoprazole (PROTONIX) 40 MG tablet TAKE 1 TABLET DAILY 90 tablet 4  . rosuvastatin (CRESTOR) 20 MG tablet Take 1 tablet (20 mg total) by mouth daily. 90 tablet 3  . SYRINGE-NEEDLE, DISP, 3 ML 25G X 1" 3 ML MISC Patient is to use to self inject 1 ml of vitamin B12 per provider instructions. 4 each 3  . tamsulosin (FLOMAX) 0.4 MG CAPS capsule TAKE 1 CAPSULE DAILY 90 capsule 3  . tiZANidine (ZANAFLEX) 4 MG tablet Take 1 tablet (4 mg total) by mouth every 6 (six) hours as needed for muscle spasms. 30 tablet 0   No facility-administered medications prior to visit.      Per HPI unless specifically indicated in ROS section below Review of Systems Objective:    BP 118/74 (BP Location: Left Arm, Patient Position: Sitting, Cuff Size: Normal)   Pulse (!) 59  Temp 97.8 F (36.6 C) (Temporal)   Ht 5\' 7"  (1.702 m)   Wt 198 lb 7 oz (90 kg)   SpO2 97%   BMI 31.08 kg/m   Wt Readings from Last 3 Encounters:  09/08/18 198 lb 7 oz (90 kg)  08/01/18 201 lb (91.2 kg)  04/21/18 205 lb (93 kg)    Physical Exam Vitals signs and nursing note reviewed.  Constitutional:      General: He is not in acute distress.    Appearance: Normal appearance. He is not ill-appearing.     Comments: Tired appearing  HENT:     Mouth/Throat:     Mouth: Mucous membranes are moist.     Pharynx: No posterior oropharyngeal erythema.  Cardiovascular:     Rate and Rhythm: Normal rate and regular rhythm.     Pulses: Normal pulses.     Heart sounds: Normal heart sounds. No murmur.  Pulmonary:     Effort: Pulmonary  effort is normal. No respiratory distress.     Breath sounds: Normal breath sounds. No wheezing, rhonchi or rales.  Abdominal:     General: Abdomen is flat. Bowel sounds are increased. There is no distension.     Palpations: Abdomen is soft. There is no mass.     Tenderness: There is abdominal tenderness (moderate) in the epigastric area and left lower quadrant. There is no right CVA tenderness, left CVA tenderness, guarding or rebound. Negative signs include Murphy's sign.     Hernia: No hernia is present.  Musculoskeletal:     Right lower leg: No edema.     Left lower leg: No edema.  Neurological:     Mental Status: He is alert.  Psychiatric:        Mood and Affect: Mood normal.        Behavior: Behavior normal.       Results for orders placed or performed in visit on 18/84/16  Basic metabolic panel  Result Value Ref Range   Sodium 139 135 - 145 mEq/L   Potassium 4.2 3.5 - 5.1 mEq/L   Chloride 106 96 - 112 mEq/L   CO2 27 19 - 32 mEq/L   Glucose, Bld 88 70 - 99 mg/dL   BUN 19 6 - 23 mg/dL   Creatinine, Ser 1.51 (H) 0.40 - 1.50 mg/dL   Calcium 9.1 8.4 - 10.5 mg/dL   GFR 46.74 (L) >60.00 mL/min  CK  Result Value Ref Range   Total CK 127 7 - 232 U/L   Assessment & Plan:   Problem List Items Addressed This Visit    Left lower quadrant abdominal pain - Primary    Concern for recurrent diverticulitis however has significant bleeding as well. Check labs (CMP, lipase, CBC) and start flagyl / cipro (renally dosed). Red flags to seek ER care over weekend reviewed. Clear liquid diet transitioning to bland diet reviewed. Low threshold to image (non contrast CT given allergy) if not improving over weekend. Pt agrees with plan. Given amt of bleeding endorsed, I did recommend GI eval once acute illness resolved.       Relevant Orders   Comprehensive metabolic panel   CBC with Differential/Platelet   Lipase   Ambulatory referral to Gastroenterology    Other Visit Diagnoses    Blood  in stool       Relevant Orders   Comprehensive metabolic panel   CBC with Differential/Platelet   Lipase   Ambulatory referral to Gastroenterology  Meds ordered this encounter  Medications  . ciprofloxacin (CIPRO) 250 MG tablet    Sig: Take 1 tablet (250 mg total) by mouth 2 (two) times daily.    Dispense:  20 tablet    Refill:  0  . metroNIDAZOLE (FLAGYL) 500 MG tablet    Sig: Take 1 tablet (500 mg total) by mouth 3 (three) times daily.    Dispense:  30 tablet    Refill:  0   Orders Placed This Encounter  Procedures  . Comprehensive metabolic panel  . CBC with Differential/Platelet  . Lipase  . Ambulatory referral to Gastroenterology    Referral Priority:   Routine    Referral Type:   Consultation    Referral Reason:   Specialty Services Required    Number of Visits Requested:   1    Follow up plan: Return if symptoms worsen or fail to improve.  Ria Bush, MD

## 2018-09-08 NOTE — Assessment & Plan Note (Addendum)
Concern for recurrent diverticulitis however has significant bleeding as well. Check labs (CMP, lipase, CBC) and start flagyl / cipro (renally dosed). Red flags to seek ER care over weekend reviewed. Clear liquid diet transitioning to bland diet reviewed. Low threshold to image (non contrast CT given allergy) if not improving over weekend. Pt agrees with plan. Given amt of bleeding endorsed, I did recommend GI eval once acute illness resolved.

## 2018-09-11 ENCOUNTER — Other Ambulatory Visit: Payer: Self-pay

## 2018-09-11 ENCOUNTER — Telehealth: Payer: Medicare Other | Admitting: Cardiovascular Disease

## 2018-09-11 ENCOUNTER — Telehealth: Payer: Self-pay | Admitting: Family Medicine

## 2018-09-11 DIAGNOSIS — Z20822 Contact with and (suspected) exposure to covid-19: Secondary | ICD-10-CM

## 2018-09-11 DIAGNOSIS — I714 Abdominal aortic aneurysm, without rupture, unspecified: Secondary | ICD-10-CM

## 2018-09-11 NOTE — Telephone Encounter (Signed)
Please help this patient get set up for testing.  I wish him and his wife the best.  Thanks.

## 2018-09-11 NOTE — Telephone Encounter (Signed)
Best number [phone]  Pt called stating his wife tested positive for covid19  7/12 per [facility].  They sent her home to quarantine .  They recommend pt to be test also  Pt is having no symptoms   Pt was in office 7/10 to see dr g.  He wanted to let you know he is doing better and has stopped bleeding

## 2018-09-11 NOTE — Telephone Encounter (Signed)
Notification sent to [facility].

## 2018-09-11 NOTE — Telephone Encounter (Signed)
Thanks

## 2018-09-12 ENCOUNTER — Other Ambulatory Visit: Payer: Self-pay

## 2018-09-12 ENCOUNTER — Telehealth: Payer: Self-pay | Admitting: *Deleted

## 2018-09-12 DIAGNOSIS — Z20822 Contact with and (suspected) exposure to covid-19: Secondary | ICD-10-CM

## 2018-09-12 NOTE — Telephone Encounter (Signed)
Pt scheduled for covid testing today @ 11:30 @ [facility]. Instructions given and order previously placed.

## 2018-09-12 NOTE — Telephone Encounter (Signed)
-----   Message from Josetta Huddle, [place] sent at 09/11/2018  4:43 PM EDT ----- Dr. Damita Dunnings requests Covid Testing.  Patient's wife has tested positive.

## 2018-09-15 ENCOUNTER — Telehealth: Payer: Self-pay | Admitting: Family Medicine

## 2018-09-15 NOTE — Telephone Encounter (Signed)
Pt wants to place hold on his GI referral. Pt w/hx of bleeding from diverticulitis.  Pt 100% feeling better since taking antibx. No more bleeding and abd pain. Wife symptomatic and tested positive for Covid Saturday at ER and granddaughter is asymptomatic tested positive Monday.  Pt awaiting Covid results from 09-12-2018@[facility].

## 2018-09-15 NOTE — Telephone Encounter (Signed)
Noted. I'll route to [facility] about the referral.  I'll await the covid testing.  [name]- please put him on the f/u list re: covid to check his status.  I thank all involved.  I wish him and his family the best.

## 2018-09-17 LAB — NOVEL CORONAVIRUS, NAA: SARS-CoV-2, NAA: NOT DETECTED

## 2018-09-18 ENCOUNTER — Other Ambulatory Visit (HOSPITAL_COMMUNITY): Payer: Medicare Other

## 2018-09-18 ENCOUNTER — Ambulatory Visit: Payer: Medicare Other | Admitting: Family

## 2018-09-18 NOTE — Telephone Encounter (Signed)
Spoke with patient and reviewed his negative test results.  He is asymptomatic and is doing well.  Wife is also much improved as well.  He thanks [place] for the call and check in.  Patient is now removed from [place] call list.   FYI to PCP.

## 2018-09-19 NOTE — Telephone Encounter (Signed)
Noted. Thanks.

## 2018-09-24 ENCOUNTER — Other Ambulatory Visit: Payer: Self-pay | Admitting: Physician Assistant

## 2018-09-27 ENCOUNTER — Other Ambulatory Visit: Payer: Medicare Other

## 2018-09-27 ENCOUNTER — Telehealth: Payer: Self-pay | Admitting: Internal Medicine

## 2018-09-27 ENCOUNTER — Telehealth: Payer: Self-pay

## 2018-09-27 DIAGNOSIS — R197 Diarrhea, unspecified: Secondary | ICD-10-CM

## 2018-09-27 NOTE — Telephone Encounter (Signed)
I thank all involved.  I will await the GI notes and the lab results.

## 2018-09-27 NOTE — Telephone Encounter (Signed)
Marion from patient pcp office called wanting to schedule an urgent appointment from referral for patient. Patient is having bloody stools, diarrhea, abd pain. However I had no urgent appts avail. With dr. Henrene Pastor

## 2018-09-27 NOTE — Telephone Encounter (Signed)
plz restart GI referral request. Will forward to [facility].  If ongoing diarrhea with black stools recommend he come in for C diff test as well - ordered.

## 2018-09-27 NOTE — Telephone Encounter (Signed)
PCP office requesting pt be seen asap. Spoke with pt and he report he was treated with 2 different antibiotics for diverticulitis, states there was blood in his stool at that time. Pt has completed those antibiotics. Now pt states he is having 7-8 stools/day and 1 or 2 of the stools is diarrhea. Pt reports he is having abdominal pain but not like he had with diverticulitis. States the pain feels like he has to have another BM. Pt is picking up a kit to complete cdiff testing from PCP. There are no available appointments. Please advise.

## 2018-09-27 NOTE — Telephone Encounter (Signed)
Pt left v/m that he came to office and picked up a stool specimen and there are no instructions and pt does not know how to do the test.pt request cb.

## 2018-09-27 NOTE — Telephone Encounter (Signed)
Spoke with pt and he is aware. 

## 2018-09-27 NOTE — Telephone Encounter (Signed)
Noted  

## 2018-09-27 NOTE — Telephone Encounter (Signed)
Notified pt instructions on putting stool in the cup and bringing it back to the lab,

## 2018-09-27 NOTE — Telephone Encounter (Signed)
I just evaluated him in April for abdominal complaints and diarrhea.  If C. difficile testing is negative, set him up for colonoscopy in the [place], next available, to evaluate symptom complex.  Thanks

## 2018-09-27 NOTE — Telephone Encounter (Signed)
Called Dr Pearletha Furl office and they will Triage this request and call the patient directly.

## 2018-09-27 NOTE — Telephone Encounter (Signed)
Called patient and he is coming over to the office now to pick up the C Diff test kit. Patient is aware that GI will be calling him to schedule.

## 2018-09-27 NOTE — Telephone Encounter (Signed)
Pt was seen 09/08/18 by Dr Darnell Level. Pt finished abx week or so ago. Pt still having watery diarrhea a couple times a day and soft BM that appears very dark and sometimes black 4 or so times a day. No bleeding in BM and no N or V. Pt has not taken any Pepto Bismol. On 09/15/18 p was having to wait on GI referral due to covid quarantine. Pt having lower abd pain all the way across the lower abd. Pain level 4-5. The 14 day quarantine is over now. Pt wants to know if can get GI referral or what to do about abd pain and diarrhea and dark stools. Pt has H/A of pain level 6 today. No other covid symptoms. [facility] [place]. Pt has seen a GI doctor before, Dr Henrene Pastor with [facility]. Pt request cb.

## 2018-09-28 ENCOUNTER — Other Ambulatory Visit: Payer: Medicare Other

## 2018-09-28 DIAGNOSIS — R197 Diarrhea, unspecified: Secondary | ICD-10-CM | POA: Diagnosis not present

## 2018-10-02 ENCOUNTER — Other Ambulatory Visit: Payer: Self-pay

## 2018-10-03 ENCOUNTER — Other Ambulatory Visit: Payer: Self-pay | Admitting: Family Medicine

## 2018-10-03 ENCOUNTER — Encounter: Payer: Self-pay | Admitting: Family Medicine

## 2018-10-03 DIAGNOSIS — M542 Cervicalgia: Secondary | ICD-10-CM | POA: Diagnosis not present

## 2018-10-03 DIAGNOSIS — G518 Other disorders of facial nerve: Secondary | ICD-10-CM | POA: Diagnosis not present

## 2018-10-03 DIAGNOSIS — M791 Myalgia, unspecified site: Secondary | ICD-10-CM | POA: Diagnosis not present

## 2018-10-03 DIAGNOSIS — R51 Headache: Secondary | ICD-10-CM | POA: Diagnosis not present

## 2018-10-03 MED ORDER — VANCOMYCIN HCL 125 MG PO CAPS: 125.0000 mg | ORAL_CAPSULE | Freq: Four times a day (QID) | ORAL | 0 refills | Status: AC

## 2018-10-03 NOTE — Telephone Encounter (Signed)
Pt tested positive for Cdiff. PCP has prescribed Vanco,.

## 2018-10-03 NOTE — Telephone Encounter (Signed)
Noted  

## 2018-10-04 ENCOUNTER — Other Ambulatory Visit: Payer: Self-pay | Admitting: Family Medicine

## 2018-10-04 LAB — C. DIFFICILE GDH AND TOXIN A/B
GDH ANTIGEN: DETECTED
MICRO NUMBER:: 720706
SPECIMEN QUALITY:: ADEQUATE
TOXIN A AND B: NOT DETECTED

## 2018-10-04 LAB — CLOSTRIDIUM DIFFICILE TOXIN B, QUALITATIVE, REAL-TIME PCR: Toxigenic C. Difficile by PCR: DETECTED — AB

## 2018-10-11 ENCOUNTER — Other Ambulatory Visit: Payer: Self-pay

## 2018-10-11 ENCOUNTER — Ambulatory Visit (HOSPITAL_COMMUNITY)
Admission: RE | Admit: 2018-10-11 | Discharge: 2018-10-11 | Disposition: A | Discharge status: Home / Self Care | Payer: Medicare Other | Source: Ambulatory Visit | Attending: Family | Admitting: Family

## 2018-10-11 ENCOUNTER — Ambulatory Visit (INDEPENDENT_AMBULATORY_CARE_PROVIDER_SITE_OTHER): Payer: Medicare Other | Admitting: Family

## 2018-10-11 ENCOUNTER — Encounter: Payer: Self-pay | Admitting: Family

## 2018-10-11 VITALS — BP 130/77 | HR 62 | Temp 96.6°F | Resp 18 | Ht 67.0 in | Wt 200.0 lb

## 2018-10-11 DIAGNOSIS — I723 Aneurysm of iliac artery: Secondary | ICD-10-CM

## 2018-10-11 DIAGNOSIS — I714 Abdominal aortic aneurysm, without rupture, unspecified: Secondary | ICD-10-CM

## 2018-10-11 DIAGNOSIS — Z87891 Personal history of nicotine dependence: Secondary | ICD-10-CM | POA: Diagnosis not present

## 2018-10-11 DIAGNOSIS — I6523 Occlusion and stenosis of bilateral carotid arteries: Secondary | ICD-10-CM | POA: Diagnosis not present

## 2018-10-11 NOTE — Progress Notes (Signed)
[facility] [place]   CC: Follow up Abdominal Aortic Aneurysm  History of Present Illness  Tony Hunt is a 63 y.o. (1955-12-02) male who returns for for follow-up of his small abdominal aortic aneurysm and bilateral common iliac artery aneurysms. This was initially detected when he underwent a CT scan for abdominal pain which demonstrated diverticulosis. CT scan showed a maximum diameter of 3 cm. He continues to be without abdominal pain or back pain. He denies any neurologic symptoms. Specifically he denies numbness or weakness in either extremity no slurred speech. He denies any claudication symptoms.   The patient has a history of laryngeal cancer, status post radiation treatment. He quit smoking in 2012 at the time of this treatment. He has a history of hypercholesterolemia which is managed with a statin. His blood pressure has been well controlled.  He had a heart cath in March 2018 by Dr. Daneen Schick for chest pain, no significant stenosis found per pt, no intervention necessary. He had a cardiac stent placed about 2014, has not needed CABG.  He denies any known hx of stroke or TIA.   Dr. Trula Slade last evaluated pt on 03-31-15. At that time abdominal ultrasound showed a maximum diameter of his aorta of 2.8 cm, maximum iliac diameter was 2.1 cm.  The patient was to follow-up in 2 years for a repeat ultrasound.  Dr. Trula Slade discussed indications for repair would be aortic diameter of 5 cm or iliac diameter of 3 cm. Continue statin.  He has a history of worsening abdominal pain, is being treated by his PCP and a gastroenterologist, currently taking Cipro. He states he was treated a few times for diverticulitis, had blood in his stool.   He takes a daily 81 mg ASA and Plavix.   Diabetic: No Tobacco use: former smoker, quit in 2012, started in his early 20's   Past Medical History:  Diagnosis Date  . AAA (abdominal aortic aneurysm) without rupture  ([place]) 02/14/2014   [place] 1/19:  AAA 3 cm  . ALCOHOL ABUSE, HX OF    distant history  . Anxiety    occ panic sx, increased after death of mother  . ANXIETY DEPRESSION 05/10/2007   history of, during difficult relationship  . CAD (coronary artery disease) 08/28/2012   08/25/2012 Successful PTCA/DES x mid LAD // LAD prox 30, mid stent patent; D1 40; LCx ok; OM2 50, 50; RCA prox 20; EF 55-65  // Nuclear stress test 9/19: EF 58, small inferobasal infarct, no ischemia, Low Risk (inf defect ? diaph atten?)   . Cancer ([place]) 03/2010   tumor on larynx/tx radiation  . Carotid artery disease ([place]) 09/12/2017   [place] 6/19:  R 60-79; L 1-39; R vertebral occluded; FU 12 months  . 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 Chronic diastolic CHF (congestive heart failure) ([place]) 12/21/2017   Echo 11/17/2017 - Mild concentric LVH, EF 65-70, normal wall motion, grade 2 diastolic dysfunction, mild LAE, normal RVSF, trivial TR  . Dilatation of aorta ([facility]) 02/14/2014  . Diverticulosis   . ESOPHAGEAL STRICTURE 05/10/2007  . GERD 05/10/2007  . Heart disease   . HIATAL HERNIA 05/10/2007  . History of echocardiogram    Echo 9/19: mild conc LVH, vigorous LVF, EF 65-70, Gr 2 DD, mild LAE, normal RVSF, trivial AI  . HYPERLIPIDEMIA 05/10/2007  . HYPERTENSION 05/10/2007  . 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 SLEEP APNEA 05/10/2007  lost 100lb-not now  . Upper airway cough syndrome    Per Dr. Melvyn Novas, pulmonary    Past Surgical History:  Procedure Laterality Date  . APPENDECTOMY    . BACK SURGERY  1997   lower back x3  . CARPOMETACARPAL ([facility]) FUSION OF THUMB Left 04/02/2014   Procedure: CARPOMETACARPAL ([place]) FUSION OF THUMB/LEFT THUMB INTERPHALNGEAL JOINT FUSION;  Surgeon: Jolyn Nap, MD;  Location: [facility];  Service: Orthopedics;  Laterality: Left;  . COLONOSCOPY    . CORONARY STENT PLACEMENT     2.75 x 12 mm Promus Premier DES was deployed  in the mid LAD. The stent was post-dilated with a 3.0 x 9 mm [place] balloon - Mid LAD.  Marland Kitchen LARYNGOSCOPY / BRONCHOSCOPY / ESOPHAGOSCOPY  2012   bx  . LEFT HEART CATH AND CORONARY ANGIOGRAPHY N/A 05/28/2016   Procedure: Left Heart Cath and Coronary Angiography;  Surgeon: Belva Crome, MD;  Location: [place] [facility];  Service: Cardiovascular;  Laterality: N/A;  . LEFT HEART CATHETERIZATION WITH CORONARY ANGIOGRAM N/A 08/25/2012   Procedure: LEFT HEART CATHETERIZATION WITH CORONARY ANGIOGRAM;  Surgeon: Burnell Blanks, MD;  Location[facility] C[facility] LAB;  Service: Cardiovascular;  Laterality: N/A;  . LEFT HEART CATHETERIZATION WITH CORONARY ANGIOGRAM N/A 11/23/2013   Procedure: LEFT HEART CATHETERIZATION WITH CORONARY ANGIOGRAM;  Surgeon: Jettie Booze, MD;  Location: [facility] CATH LAB;  Service: Cardiovascular;  Laterality: N/A;  . WRIST FRACTURE SURGERY  08/21/10   Right wrist x4   Social History Social History   Socioeconomic History  . Marital status: Married    Spouse name: Not on file  . Number of children: 1  . Years of education: Not on file  . Highest education level: Not on file  Occupational History  . 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 Financial resource strain: Not on file  . Food insecurity    Worry: Not on file    Inability: Not on file  . Transportation needs    Medical: Not on file    Non-medical: Not on file  Tobacco Use  . Smoking status: Former Smoker    Quit date: 09/30/2010    Years since quitting: 8.0  . Smokeless tobacco: Former Network engineer and Sexual Activity  . Alcohol use: Yes    Comment: 2-3 drinks a week at most  . Drug use: No  . Sexual activity: Not Currently  Lifestyle  . Physical activity    Days per week: Not on file    Minutes per session: Not on file  . Stress: Not on file  Relationships  . Social Herbalist on phone: Not on file    Gets together: Not on file    Attends religious service:  Not on file    Active member of club or organization: Not on file    Attends meetings of clubs or organizations: Not on file    Relationship status: Not on file  . Intimate partner violence    Fear of current or ex partner: Not on file    Emotionally abused: Not on file    Physically abused: Not on file    Forced sexual activity: Not on file  Other Topics Concern  . Not on file  Social History Narrative   Married 8/13. 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  Noted tinnitus, h/o noise exposure that was service related.    Family History Family History  Problem Relation Age of Onset  . Brain cancer Father   . Dementia Father        h/o brain tumor  . Heart disease Mother   . Hypertension Mother   . Heart disease Brother   . Hypertension Brother   . Hyperlipidemia Brother   . Colon polyps Brother   . Colon cancer Neg Hx   . Prostate cancer Neg Hx   . Esophageal cancer Neg Hx   . Pancreatic cancer Neg Hx   . Liver disease Neg Hx     Current Outpatient Medications on File Prior to Visit  Medication Sig Dispense Refill  . aspirin EC 81 MG tablet Take 1 tablet (81 mg total) by mouth daily. 90 tablet 3  . ciprofloxacin (CIPRO) 250 MG tablet Take 1 tablet (250 mg total) by mouth 2 (two) times daily. 20 tablet 0  . clopidogrel (PLAVIX) 75 MG tablet TAKE 1 TABLET DAILY WITH BREAKFAST 90 tablet 2  . cyanocobalamin (,VITAMIN B-12,) 1000 MCG/ML injection Inject weekly X 4 weeks and then monthly thereafter. Injection start date 08/07/18. 10 mL 1  . fenofibrate 160 MG tablet TAKE 1 TABLET DAILY 90 tablet 2  . fish oil-omega-3 fatty acids 1000 MG capsule Take 1 g by mouth daily.    Marland Kitchen gabapentin (NEURONTIN) 100 MG capsule Take 100 mg by mouth as needed (for headache).    Marland Kitchen levothyroxine (SYNTHROID) 100 MCG tablet TAKE 1 TABLET DAILY BEFORE BREAKFAST 90 tablet 2  . Melatonin 10 MG TABS Take 10 mg by mouth at bedtime.    . metoprolol  tartrate (LOPRESSOR) 25 MG tablet TAKE ONE-HALF (1/2) TABLET TWICE A DAY 30 tablet 6  . metroNIDAZOLE (FLAGYL) 500 MG tablet Take 1 tablet (500 mg total) by mouth 3 (three) times daily. 30 tablet 0  . nitroGLYCERIN (NITROSTAT) 0.4 MG SL tablet Place 1 tablet (0.4 mg total) under the tongue every 5 (five) minutes as needed for chest pain (do not exceed 3 pills during one episode). 25 tablet 6  . pantoprazole (PROTONIX) 40 MG tablet TAKE 1 TABLET DAILY 90 tablet 4  . rosuvastatin (CRESTOR) 20 MG tablet Take 1 tablet (20 mg total) by mouth daily. 90 tablet 3  . SYRINGE-NEEDLE, DISP, 3 ML 25G X 1" 3 ML MISC Patient is to use to self inject 1 ml of vitamin B12 per provider instructions. 4 each 3  . tamsulosin (FLOMAX) 0.4 MG CAPS capsule TAKE 1 CAPSULE DAILY 90 capsule 3  . tiZANidine (ZANAFLEX) 4 MG tablet Take 1 tablet (4 mg total) by mouth every 6 (six) hours as needed for muscle spasms. 30 tablet 0  . vancomycin (VANCOCIN) 125 MG capsule Take 1 capsule (125 mg total) by mouth 4 (four) times daily for 10 days. 40 capsule 0  . gabapentin (NEURONTIN) 600 MG tablet Take 1 tablet (600 mg total) by mouth at bedtime. (Patient not taking: Reported on 10/11/2018)     No current facility-administered medications on file prior to visit.    Allergies  Allergen Reactions  . Imdur [Isosorbide Dinitrate] Other (See Comments)    Headache at 60mg  dose, but able to tolerate 30mg  per day  . Iohexol Anaphylaxis and Other (See Comments)    OK with 13 hour prep  . Lisinopril Other (See Comments)    Elevated cr- improved off med  . Pravastatin Nausea Only and Other (See Comments)  Intense headache  . Codeine Itching  . Iodine-131 Other (See Comments)    Passed out Passed out  . Simvastatin Other (See Comments)    Edema  . Statins Other (See Comments)    Sig edema on simvastatin    ROS: See HPI for pertinent positives and negatives.  Physical Examination  Vitals:   10/11/18 0836  BP: 130/77  Pulse:  62  Resp: 18  Temp: (!) 96.6 F (35.9 C)  TempSrc: Temporal  SpO2: 98%  Weight: 200 lb (90.7 kg)  Height: 5\' 7"  (1.702 m)   Body mass index is 31.32 kg/m.  General: A&O x 3, WD, obese male. HEENT: Grossly intact and WNL.  Pulmonary: Sym exp, respirations are non labored, good air movement in all fields, CTAB, no rales, rhonchi, or wheezing. Cardiac: Regular rhythm and rate, no detected murmur.  Carotid Bruits Right Left   Negative Negative   Adominal aortic pulse is not palpable Radial pulses: 2+ right, 1+ left palpable                          VASCULAR EXAM:                                                                                                         LE Pulses Right Left       FEMORAL  2+ palpable  2+ palpable        POPLITEAL  not palpable   not palpable       POSTERIOR TIBIAL  2+ palpable   not palpable        DORSALIS PEDIS      ANTERIOR TIBIAL 2+ palpable  not palpable     Gastrointestinal: soft, NTND, -G/R, - HSM, - masses palpated, - CVAT B. Musculoskeletal: M/S 5/5 throughout, Extremities without ischemic changes. Skin: No rashes, no ulcers, no cellulitis.   Neurologic: CN 2-12 intact, Pain and light touch intact in extremities are intact, Motor exam as listed above. Psychiatric: Normal thought content, mood appropriate to clinical situation.    DATA  AAA Duplex (10/11/2018):  Previous size: 3.0 cm (Date: 03-28-17); Right CIA: 2.4 cm; Left CIA: 2.3 cm   Current size (Date: 10-11-18):   Abdominal Aorta Findings: +-----------+-------+----------+----------+--------+--------+--------+ Location   AP (cm)Trans (cm)PSV (cm/s)WaveformThrombusComments +-----------+-------+----------+----------+--------+--------+--------+ Proximal   3.10   3.17      87        biphasic                 +-----------+-------+----------+----------+--------+--------+--------+ Mid        2.57   2.29      74        biphasic                  +-----------+-------+----------+----------+--------+--------+--------+ Distal     3.36   3.58      62        biphasic                 +-----------+-------+----------+----------+--------+--------+--------+ RT CIA Prox2.6  2.6       69        biphasic                 +-----------+-------+----------+----------+--------+--------+--------+ LT CIA Prox2.5    2.6       53        biphasic                 +-----------+-------+----------+----------+--------+--------+--------+  Elevated velocities of 309 cm/sec noted in what appears to be the left internal iliac artery, suggestive of a >50% stenosis.  Summary: Abdominal Aorta: There is evidence of abnormal dilitation of the Proximal and Distal Abdominal aorta. There is evidence of abnormal dilation of the Right Common Iliac artery and Left Common Iliac artery. The largest aortic measurement is 3.6 cm. The largest aortic diameter has increased compared to prior exam. Previous diameter measurement was 3.0 cm obtained on 03/28/2017.   Carotid Duplex, requested by Dr. Acie Fredrickson (08-21-18): Right Carotid: Velocities in the right ICA are consistent with a 60-79%                stenosis. The RICA velocities are elevated with mild increase in                velocities when compared to the prior exam. Left Carotid: Velocities in the left ICA are consistent with a 1-39% stenosis.               Non-hemodynamically significant plaque noted in the CCA. The LICA               velocities remain within normal range with mild increase in               velocities when compared to the prior exam. Vertebrals:  Left vertebral artery demonstrates antegrade flow. Right vertebral              artery demonstrates an occlusion. Subclavians: Normal flow hemodynamics were seen in bilateral subclavian arteries.    Medical Decision Making  The patient is a 63 y.o. male who presents with an asymptomatic small AAA with an increase in size to 3.6 cm from 3  cm in January 2019; slight increase in bilateral common iliac arteries to 2.6 cm.  Dr. Acie Fredrickson has been monitoring pt's extracranial carotid artery stenosis; June 2020 carotid duplex demonstrated 60-79% right ICA stenosis and 1-39% left ICA stenosis. Pt has no history of stroke or TIA.  Will be happy to monitor his carotid arteries, and if he becomes symptomatic or if his internal carotid artery stenosis increases to 80-99%, Dr. Trula Slade will discuss with pt at that time how to address the stenosis.    Based on this patient's exam and diagnostic studies, the patient will follow up in 9 months with the following studies: AAA duplex and carotid duplex.  Consideration for repair of AAA would be made when the size is 5.5cm, growth > 1 cm/yr, and symptomatic status.  Abdominal aortic aneurysm less than 5-1/2 cm in diameter has less then 1/2% risk of rupture per year.        The patient was given information about AAA including signs, symptoms, treatment, and how to minimize the risk of enlargement and rupture of aneurysms.    I emphasized the importance of maximal medical management including strict control of blood pressure, blood glucose, and lipid levels, antiplatelet agents, obtaining regular exercise, and continued  cessation of smoking.   The patient was advised to call 911 should the patient experience  sudden onset abdominal or back pain.   Thank you for allowing [place] to participate in this patient's care.  Clemon Chambers, RN, MSN, FNP-C [facility] [place] Office: [facility] Physician: Oneida Alar  10/11/2018, 9:10 AM

## 2018-10-11 NOTE — Patient Instructions (Signed)
Before your next abdominal ultrasound:  Avoid gas forming foods and beverages the day before the test.   Take two Extra-Strength Gas-X capsules at bedtime the night before the test. Take another two Extra-Strength Gas-X capsules in the middle of the night if you get up to the restroom, if not, first thing in the morning with water.  Do not chew gum.     Abdominal Aortic Aneurysm  An aneurysm is a bulge in one of the blood vessels that carry blood away from the heart (artery). It happens when blood pushes up against a weak or damaged place in the wall of an artery. An abdominal aortic aneurysm happens in the main artery of the body (aorta). Some aneurysms may not cause problems. If it grows, it can burst or tear, causing bleeding inside the body. This is an emergency. It needs to be treated right away. What are the causes? The exact cause of this condition is not known. What increases the risk? The following may make you more likely to get this condition:  Being a male who is 60 years of age or older.  Being white (Caucasian).  Using tobacco.  Having a family history of aneurysms.  Having the following conditions: ? Hardening of the arteries (arteriosclerosis). ? Inflammation of the walls of an artery (arteritis). ? Certain genetic conditions. ? Being very overweight (obesity). ? An infection in the wall of the aorta (infectious aortitis). ? High cholesterol. ? High blood pressure (hypertension). What are the signs or symptoms? Symptoms depend on the size of the aneurysm and how fast it is growing. Most grow slowly and do not cause any symptoms. If symptoms do occur, they may include:  Pain in the belly (abdomen), side, or back.  Feeling full after eating only small amounts of food.  Feeling a throbbing lump in the belly. Symptoms that the aneurysm has burst (ruptured) include:  Sudden, very bad pain in the belly, side, or back.  Feeling sick to your stomach (nauseous).   Throwing up (vomiting).  Feeling light-headed or passing out. How is this treated? Treatment for this condition depends on:  The size of the aneurysm.  How fast it is growing.  Your age.  Your risk of having it burst. If your aneurysm is smaller than 2 inches (5 cm), your doctor may manage it by:  Checking it often to see if it is getting bigger. You may have an imaging test (ultrasound) to check it every 3-6 months, every year, or every few years.  Giving you medicines to: ? Control blood pressure. ? Treat pain. ? Fight infection. If your aneurysm is larger than 2 inches (5 cm), you may need surgery to fix it. Follow these instructions at home: Lifestyle  Do not use any products that have nicotine or tobacco in them. This includes cigarettes, e-cigarettes, and chewing tobacco. If you need help quitting, ask your doctor.  Get regular exercise. Ask your doctor what types of exercise are best for you. Eating and drinking  Eat a heart-healthy diet. This includes eating plenty of: ? Fresh fruits and vegetables. ? Whole grains. ? Low-fat (lean) protein. ? Low-fat dairy products.  Avoid foods that are high in saturated fat and cholesterol. These foods include red meat and some dairy products.  Do not drink alcohol if: ? Your doctor tells you not to drink. ? You are pregnant, may be pregnant, or are planning to become pregnant.  If you drink alcohol: ? Limit how much you use   to:  0-1 drink a day for women.  0-2 drinks a day for men. ? Be aware of how much alcohol is in your drink. In the [place] one drink equals any of these:  One typical bottle of beer (12 oz).  One-half glass of wine (5 oz).  One shot of hard liquor (1 oz). General instructions  Take over-the-counter and prescription medicines only as told by your doctor.  Keep your blood pressure within normal limits. Ask your doctor what your blood pressure should be.  Have your blood sugar (glucose) level  and cholesterol levels checked regularly. Keep your blood sugar level and cholesterol levels within normal limits.  Avoid heavy lifting and activities that take a lot of effort. Ask your doctor what activities are safe for you.  Keep all follow-up visits as told by your doctor. This is important. ? Talk to your doctor about regular screenings to see if the aneurysm is getting bigger. Contact a doctor if you:  Have pain in your belly, side, or back.  Have a throbbing feeling in your belly.  Have a family history of aneurysms. Get help right away if you:  Have sudden, bad pain in your belly, side, or back.  Feel sick to your stomach.  Throw up.  Have trouble pooping (constipation).  Have trouble peeing (urinating).  Feel light-headed.  Have a fast heart rate when you stand.  Have sweaty skin that is cold to the touch (clammy).  Have shortness of breath.  Have a fever. These symptoms may be an emergency. Do not wait to see if the symptoms will go away. Get medical help right away. Call your local emergency services (911 in the [place] Do not drive yourself to the hospital. Summary  An aneurysm is a bulge in one of the blood vessels that carry blood away from the heart (artery). Some aneurysms may not cause problems.  You may need to have yours checked often. If it grows, it can burst or tear. This causes bleeding inside the body. It needs to be treated right away.  Follow instructions from your doctor about healthy lifestyle changes.  Keep all follow-up visits as told by your doctor. This is important. This information is not intended to replace advice given to you by your health care provider. Make sure you discuss any questions you have with your health care provider. Document Released: 06/12/2012 Document Revised: 06/05/2018 Document Reviewed: 09/24/2017 Elsevier Patient Education  2020 [employer].     Stroke Prevention Some medical conditions and lifestyle  choices can lead to a higher risk for a stroke. You can help to prevent a stroke by making nutrition, lifestyle, and other changes. What nutrition changes can be made?   Eat healthy foods. ? Choose foods that are high in fiber. These include:  Fresh fruits.  Fresh vegetables.  Whole grains. ? Eat at least 5 or more servings of fruits and vegetables each day. Try to fill half of your plate at each meal with fruits and vegetables. ? Choose lean protein foods. These include:  Lowfat (lean) cuts of meat.  Chicken without skin.  Fish.  Tofu.  Beans.  Nuts. ? Eat low-fat dairy products. ? Avoid foods that:  Are high in salt (sodium).  Have saturated fat.  Have trans fat.  Have cholesterol.  Are processed.  Are premade.  Follow eating guidelines as told by your doctor. These may include: ? Reducing how many calories you eat and drink each day. ? Limiting how  much salt you eat or drink each day to 1,500 milligrams (mg). ? Using only healthy fats for cooking. These include:  Olive oil.  Canola oil.  Sunflower oil. ? Counting how many carbohydrates you eat and drink each day. What lifestyle changes can be made?  Try to stay at a healthy weight. Talk to your doctor about what a good weight is for you.  Get at least 30 minutes of moderate physical activity at least 5 days a week. This can include: ? Fast walking. ? Biking. ? Swimming.  Do not use any products that have nicotine or tobacco. This includes cigarettes and e-cigarettes. If you need help quitting, ask your doctor. Avoid being around tobacco smoke in general.  Limit how much alcohol you drink to no more than 1 drink a day for nonpregnant women and 2 drinks a day for men. One drink equals 12 oz of beer, 5 oz of wine, or 1 oz of hard liquor.  Do not use drugs.  Avoid taking birth control pills. Talk to your doctor about the risks of taking birth control pills if: ? You are over 64 years old. ? You  smoke. ? You get migraines. ? You have had a blood clot. What other changes can be made?  Manage your cholesterol. ? It is important to eat a healthy diet. ? If your cholesterol cannot be managed through your diet, you may also need to take medicines. Take medicines as told by your doctor.  Manage your diabetes. ? It is important to eat a healthy diet and to exercise regularly. ? If your blood sugar cannot be managed through diet and exercise, you may need to take medicines. Take medicines as told by your doctor.  Control your high blood pressure (hypertension). ? Try to keep your blood pressure below 130/80. This can help lower your risk of stroke. ? It is important to eat a healthy diet and to exercise regularly. ? If your blood pressure cannot be managed through diet and exercise, you may need to take medicines. Take medicines as told by your doctor. ? Ask your doctor if you should check your blood pressure at home. ? Have your blood pressure checked every year. Do this even if your blood pressure is normal.  Talk to your doctor about getting checked for a sleep disorder. Signs of this can include: ? Snoring a lot. ? Feeling very tired.  Take over-the-counter and prescription medicines only as told by your doctor. These may include aspirin or blood thinners (antiplatelets or anticoagulants).  Make sure that any other medical conditions you have are managed. Where to find more information  American Stroke Association: www.strokeassociation.org  National Stroke Association: www.stroke.org Get help right away if:  You have any symptoms of stroke. "BE FAST" is an easy way to remember the main warning signs: ? B - Balance. Signs are dizziness, sudden trouble walking, or loss of balance. ? E - Eyes. Signs are trouble seeing or a sudden change in how you see. ? F - Face. Signs are sudden weakness or loss of feeling of the face, or the face or eyelid drooping on one side. ? A - Arms.  Signs are weakness or loss of feeling in an arm. This happens suddenly and usually on one side of the body. ? S - Speech. Signs are sudden trouble speaking, slurred speech, or trouble understanding what people say. ? T - Time. Time to call emergency services. Write down what time symptoms started.  You have other signs of stroke, such as: ? A sudden, very bad headache with no known cause. ? Feeling sick to your stomach (nausea). ? Throwing up (vomiting). ? Jerky movements you cannot control (seizure). These symptoms may represent a serious problem that is an emergency. Do not wait to see if the symptoms will go away. Get medical help right away. Call your local emergency services (911 in the [place] Do not drive yourself to the hospital. Summary  You can prevent a stroke by eating healthy, exercising, not smoking, drinking less alcohol, and treating other health problems, such as diabetes, high blood pressure, or high cholesterol.  Do not use any products that contain nicotine or tobacco, such as cigarettes and e-cigarettes.  Get help right away if you have any signs or symptoms of a stroke. This information is not intended to replace advice given to you by your health care provider. Make sure you discuss any questions you have with your health care provider. Document Released: 08/17/2011 Document Revised: 04/13/2018 Document Reviewed: 05/19/2016 Elsevier Patient Education  2020 [employer].

## 2018-10-17 ENCOUNTER — Telehealth: Payer: Self-pay | Admitting: *Deleted

## 2018-10-17 NOTE — Telephone Encounter (Signed)
Patient called stating that he has been treated for a stomach infection and took the last antibiotic Friday. Patient stated that he is still having problems eating, stomach is in horrible shape, stomach is swollen and some SOB. Patient stated that he has absolutely no energy and not able to eat much because his stomach feels full.  Patient started that he had diarrhea several times while on the antibiotic, but none since finishing the medication. Patient stated that he has not had a fever.  Patient stated that he does not feel like the medication helped him at all. Pharmacy-[facility]

## 2018-10-17 NOTE — Telephone Encounter (Signed)
So diarrhea is better but still with malaise, fatigue, loss of appetite.  Please schedule in office visit for further evaluation with myself or PCP when he returns.

## 2018-10-18 NOTE — Telephone Encounter (Addendum)
Spoke with pt relaying Dr. Synthia Innocent message.  Pt prefers to go ahead and see Dr. Darnell Level.  Scheduled OV on 10/19/18 at 7:15.

## 2018-10-18 NOTE — Telephone Encounter (Signed)
I went to speak with Dr Darnell Level about appt and Lattie Haw CMA was on phone with pt scheduling an appt.

## 2018-10-19 ENCOUNTER — Telehealth: Payer: Self-pay | Admitting: Family Medicine

## 2018-10-19 ENCOUNTER — Ambulatory Visit: Payer: Medicare Other | Admitting: Family Medicine

## 2018-10-19 DIAGNOSIS — Z125 Encounter for screening for malignant neoplasm of prostate: Secondary | ICD-10-CM

## 2018-10-19 DIAGNOSIS — M109 Gout, unspecified: Secondary | ICD-10-CM

## 2018-10-19 DIAGNOSIS — I10 Essential (primary) hypertension: Secondary | ICD-10-CM

## 2018-10-19 NOTE — Telephone Encounter (Signed)
Patient advised and verbalized understanding 

## 2018-10-19 NOTE — Telephone Encounter (Signed)
Pt has cpe coming up on 11/03/18. He has an appt with Dr. Darnell Level on tomorrow 10/20/18 @ 7:15 and he wants to be able to get his labs done. Will you please put in his lab orders so he can get this done while he is here?   Thanks

## 2018-10-19 NOTE — Telephone Encounter (Signed)
Let him know I'll put in the orders in the meantime.  Dr. Darnell Level may need to add other orders at the visit, depending on his situation.  Thanks.

## 2018-10-20 ENCOUNTER — Other Ambulatory Visit: Payer: Medicare Other

## 2018-10-20 ENCOUNTER — Encounter: Payer: Self-pay | Admitting: Family Medicine

## 2018-10-20 ENCOUNTER — Ambulatory Visit (INDEPENDENT_AMBULATORY_CARE_PROVIDER_SITE_OTHER): Payer: Medicare Other | Admitting: Family Medicine

## 2018-10-20 ENCOUNTER — Other Ambulatory Visit: Payer: Self-pay

## 2018-10-20 ENCOUNTER — Ambulatory Visit (INDEPENDENT_AMBULATORY_CARE_PROVIDER_SITE_OTHER)
Admission: RE | Admit: 2018-10-20 | Discharge: 2018-10-20 | Disposition: A | Discharge status: Home / Self Care | Payer: Medicare Other | Source: Ambulatory Visit | Attending: Family Medicine | Admitting: Family Medicine

## 2018-10-20 VITALS — BP 122/80 | HR 60 | Temp 98.2°F | Ht 67.0 in | Wt 199.5 lb

## 2018-10-20 DIAGNOSIS — R1084 Generalized abdominal pain: Secondary | ICD-10-CM

## 2018-10-20 DIAGNOSIS — Z125 Encounter for screening for malignant neoplasm of prostate: Secondary | ICD-10-CM

## 2018-10-20 DIAGNOSIS — A0471 Enterocolitis due to Clostridium difficile, recurrent: Secondary | ICD-10-CM | POA: Diagnosis not present

## 2018-10-20 DIAGNOSIS — I10 Essential (primary) hypertension: Secondary | ICD-10-CM

## 2018-10-20 DIAGNOSIS — M109 Gout, unspecified: Secondary | ICD-10-CM

## 2018-10-20 DIAGNOSIS — K8020 Calculus of gallbladder without cholecystitis without obstruction: Secondary | ICD-10-CM | POA: Diagnosis not present

## 2018-10-20 DIAGNOSIS — I6523 Occlusion and stenosis of bilateral carotid arteries: Secondary | ICD-10-CM

## 2018-10-20 LAB — CBC WITH DIFFERENTIAL/PLATELET
Basophils Absolute: 0.1 10*3/uL (ref 0.0–0.1)
Basophils Relative: 1.2 % (ref 0.0–3.0)
Eosinophils Absolute: 0.2 10*3/uL (ref 0.0–0.7)
Eosinophils Relative: 4.7 % (ref 0.0–5.0)
HCT: 41 % (ref 39.0–52.0)
Hemoglobin: 14 g/dL (ref 13.0–17.0)
Lymphocytes Relative: 27.9 % (ref 12.0–46.0)
Lymphs Abs: 1.4 10*3/uL (ref 0.7–4.0)
MCHC: 34 g/dL (ref 30.0–36.0)
MCV: 92.8 fl (ref 78.0–100.0)
Monocytes Absolute: 0.5 10*3/uL (ref 0.1–1.0)
Monocytes Relative: 11 % (ref 3.0–12.0)
Neutro Abs: 2.7 10*3/uL (ref 1.4–7.7)
Neutrophils Relative %: 55.2 % (ref 43.0–77.0)
Platelets: 272 10*3/uL (ref 150.0–400.0)
RBC: 4.42 Mil/uL (ref 4.22–5.81)
RDW: 13.2 % (ref 11.5–15.5)
WBC: 5 10*3/uL (ref 4.0–10.5)

## 2018-10-20 LAB — COMPREHENSIVE METABOLIC PANEL
ALT: 13 U/L (ref 0–53)
AST: 15 U/L (ref 0–37)
Albumin: 4.3 g/dL (ref 3.5–5.2)
Alkaline Phosphatase: 35 U/L — ABNORMAL LOW (ref 39–117)
BUN: 19 mg/dL (ref 6–23)
CO2: 27 mEq/L (ref 19–32)
Calcium: 9.3 mg/dL (ref 8.4–10.5)
Chloride: 106 mEq/L (ref 96–112)
Creatinine, Ser: 1.62 mg/dL — ABNORMAL HIGH (ref 0.40–1.50)
GFR: 43.07 mL/min — ABNORMAL LOW (ref >60.00)
Glucose, Bld: 87 mg/dL (ref 70–99)
Potassium: 4.5 mEq/L (ref 3.5–5.1)
Sodium: 140 mEq/L (ref 135–145)
Total Bilirubin: 0.6 mg/dL (ref 0.2–1.2)
Total Protein: 6.5 g/dL (ref 6.0–8.3)

## 2018-10-20 LAB — LIPID PANEL
Cholesterol: 184 mg/dL (ref 0–200)
HDL: 34.4 mg/dL — ABNORMAL LOW (ref >39.00)
NonHDL: 149.95
Total CHOL/HDL Ratio: 5
Triglycerides: 209 mg/dL — ABNORMAL HIGH (ref 0.0–149.0)
VLDL: 41.8 mg/dL — ABNORMAL HIGH (ref 0.0–40.0)

## 2018-10-20 LAB — PSA, MEDICARE: PSA: 0.87 ng/ml (ref 0.10–4.00)

## 2018-10-20 LAB — LDL CHOLESTEROL, DIRECT: Direct LDL: 106 mg/dL

## 2018-10-20 LAB — URIC ACID: Uric Acid, Serum: 7.1 mg/dL (ref 4.0–7.8)

## 2018-10-20 MED ORDER — VANCOMYCIN HCL 125 MG PO CAPS: ORAL_CAPSULE | ORAL | 0 refills | Status: AC

## 2018-10-20 NOTE — Addendum Note (Signed)
Addended by: Tonia Ghent on: 10/20/2018 06:58 AM   Modules accepted: Orders

## 2018-10-20 NOTE — Assessment & Plan Note (Signed)
+   test by PCR 7/30 s/p 10d vanc course, now with recurrent diarrhea and ongoing abd bloating. Check stat CT, labs, will restart vanc pulse dosing. Reviewed red flags to seek ER care.

## 2018-10-20 NOTE — Telephone Encounter (Signed)
Ordered labs

## 2018-10-20 NOTE — Progress Notes (Signed)
This visit was conducted in person.  BP 122/80 (BP Location: Left Arm, Patient Position: Sitting, Cuff Size: Normal)   Pulse 60   Temp 98.2 F (36.8 C) (Temporal)   Ht 5\' 7"  (1.702 m)   Wt 199 lb 8 oz (90.5 kg)   SpO2 99%   BMI 31.25 kg/m    CC: f/u abd pain Subjective:    Patient ID: Tony Hunt, male    DOB: 07-06-1955, 63 y.o.   MRN: [id number]  HPI: Tony Hunt is a 63 y.o. male presenting on 10/20/2018 for Fatigue (C/o continued malaise and loss of appetite. Started about 1 mo ago. )   See prior notes for details.  Seen last month for presumed diverticulitis treated with cipro/flagyl course. Symptoms did fully improve. Called back at endo f last month with return of watery diarrhea as well as abdominal pain. C diff test returned positive. Treated with 10d oral vancomycin QID course, tolerated well, completed course on 10/13/2018.   Diarrhea stopped initially but came back yesterday and today - watery stools x2. Ongoing abd pain with any meals, bloated feeling, shortness of breath from feeling bloated, distended abdomen, no appetite. No fevers/chills, blood in stool or urine, chest pain, urinary symptoms. Some dizziness. Marked fatigue.  Needs CPE labs today.  Colonoscopy 2012. Has dye allergy.      Relevant past medical, surgical, family and social history reviewed and updated as indicated. Interim medical history since our last visit reviewed. Allergies and medications reviewed and updated. Outpatient Medications Prior to Visit  Medication Sig Dispense Refill  . aspirin EC 81 MG tablet Take 1 tablet (81 mg total) by mouth daily. 90 tablet 3  . clopidogrel (PLAVIX) 75 MG tablet TAKE 1 TABLET DAILY WITH BREAKFAST 90 tablet 2  . cyanocobalamin (,VITAMIN B-12,) 1000 MCG/ML injection Inject weekly X 4 weeks and then monthly thereafter. Injection start date 08/07/18. 10 mL 1  . fenofibrate 160 MG tablet TAKE 1 TABLET DAILY 90 tablet 2  . fish oil-omega-3 fatty acids 1000 MG  capsule Take 1 g by mouth daily.    Marland Kitchen gabapentin (NEURONTIN) 100 MG capsule Take 100 mg by mouth as needed (for headache).    . gabapentin (NEURONTIN) 600 MG tablet Take 1 tablet (600 mg total) by mouth at bedtime.    Marland Kitchen levothyroxine (SYNTHROID) 100 MCG tablet TAKE 1 TABLET DAILY BEFORE BREAKFAST 90 tablet 2  . Melatonin 10 MG TABS Take 10 mg by mouth at bedtime.    . metoprolol tartrate (LOPRESSOR) 25 MG tablet TAKE ONE-HALF (1/2) TABLET TWICE A DAY 30 tablet 6  . nitroGLYCERIN (NITROSTAT) 0.4 MG SL tablet Place 1 tablet (0.4 mg total) under the tongue every 5 (five) minutes as needed for chest pain (do not exceed 3 pills during one episode). 25 tablet 6  . pantoprazole (PROTONIX) 40 MG tablet TAKE 1 TABLET DAILY 90 tablet 4  . rosuvastatin (CRESTOR) 20 MG tablet Take 1 tablet (20 mg total) by mouth daily. 90 tablet 3  . SYRINGE-NEEDLE, DISP, 3 ML 25G X 1" 3 ML MISC Patient is to use to self inject 1 ml of vitamin B12 per provider instructions. 4 each 3  . tamsulosin (FLOMAX) 0.4 MG CAPS capsule TAKE 1 CAPSULE DAILY 90 capsule 3  . tiZANidine (ZANAFLEX) 4 MG tablet Take 1 tablet (4 mg total) by mouth every 6 (six) hours as needed for muscle spasms. 30 tablet 0   No facility-administered medications prior to visit.  Per HPI unless specifically indicated in ROS section below Review of Systems Objective:    BP 122/80 (BP Location: Left Arm, Patient Position: Sitting, Cuff Size: Normal)   Pulse 60   Temp 98.2 F (36.8 C) (Temporal)   Ht 5\' 7"  (1.702 m)   Wt 199 lb 8 oz (90.5 kg)   SpO2 99%   BMI 31.25 kg/m   Wt Readings from Last 3 Encounters:  10/20/18 199 lb 8 oz (90.5 kg)  10/11/18 200 lb (90.7 kg)  09/08/18 198 lb 7 oz (90 kg)    Physical Exam Vitals signs and nursing note reviewed.  Constitutional:      General: He is not in acute distress.    Appearance: Normal appearance. He is not ill-appearing.  Cardiovascular:     Rate and Rhythm: Normal rate and regular rhythm.      Pulses: Normal pulses.     Heart sounds: Normal heart sounds. No murmur.  Pulmonary:     Effort: Pulmonary effort is normal. No respiratory distress.     Breath sounds: Normal breath sounds. No wheezing, rhonchi or rales.  Abdominal:     General: Abdomen is flat. Bowel sounds are increased. There is distension.     Palpations: Abdomen is soft. There is no mass.     Tenderness: There is abdominal tenderness (mod) in the suprapubic area and left lower quadrant. There is no right CVA tenderness, left CVA tenderness, guarding or rebound. Negative signs include Murphy's sign.     Hernia: No hernia is present.     Comments: ?borborygmi  Musculoskeletal:     Right lower leg: No edema.     Left lower leg: No edema.  Neurological:     Mental Status: He is alert.       Results for orders placed or performed in visit on 09/28/18  C. difficile GDH and Toxin A/B  Result Value Ref Range   MICRO NUMBER: [id number]    SPECIMEN QUALITY: Adequate    Source STOOL    STATUS: FINAL    GDH ANTIGEN Detected    TOXIN A AND B Not Detected    COMMENT      Indeterminate. Specimen forwarded for toxigenic C. difficile PCR testing. For additional information, please refer to http://education.[facility].com/faq/FAQ136 (This link is being provided for informational/educational purposes only.)  Clostridium difficile Toxin B, Qualitative, Real-Time PCR  Result Value Ref Range   Toxigenic C. Difficile by PCR DETECTED (A) NOT DETECT   Assessment & Plan:   Problem List Items Addressed This Visit    Recurrent Clostridioides difficile diarrhea - Primary    + test by PCR 7/30 s/p 10d vanc course, now with recurrent diarrhea and ongoing abd bloating. Check stat CT, labs, will restart vanc pulse dosing. Reviewed red flags to seek ER care.       Relevant Medications   vancomycin (VANCOCIN) 125 MG capsule   Other Relevant Orders   CBC with Differential/Platelet   Gout   Generalized abdominal pain   Relevant  Orders   CT Abdomen Pelvis Wo Contrast   Essential hypertension    Other Visit Diagnoses    Prostate cancer screening           Meds ordered this encounter  Medications  . vancomycin (VANCOCIN) 125 MG capsule    Sig: Take 1 capsule (125 mg total) by mouth 4 (four) times daily for 10 days, THEN 1 capsule (125 mg total) 2 (two) times daily for 7 days, THEN 1 capsule (  125 mg total) daily for 7 days, THEN 1 capsule (125 mg total) every other day for 14 days.    Dispense:  68 capsule    Refill:  0   Orders Placed This Encounter  Procedures  . CT Abdomen Pelvis Wo Contrast    Standing Status:   Future    Standing Expiration Date:   01/20/2020    Order Specific Question:   ** REASON FOR EXAM (FREE TEXT)    Answer:   C diff diarrhea, worsening    Order Specific Question:   Preferred imaging location?    Answer:   [facility]    Order Specific Question:   Is Oral Contrast requested for this exam?    Answer:   Yes, Per Radiology protocol    Order Specific Question:   Call Results- Best Contact Number?    Answer:   patient with IV contrast allergy, has done well with 13 hr prep/benadryl    Order Specific Question:   Radiology Contrast Protocol - do NOT remove file path    Answer:   [email or web address]  . CBC with Differential/Platelet    Follow up plan: Return if symptoms worsen or fail to improve.  Tony Bush, MD

## 2018-10-20 NOTE — Patient Instructions (Signed)
Labs today I want to order CT scan for further evaluation. Restart antibiotics sent to pharmacy.

## 2018-10-24 DIAGNOSIS — Z6833 Body mass index (BMI) 33.0-33.9, adult: Secondary | ICD-10-CM | POA: Diagnosis not present

## 2018-10-24 DIAGNOSIS — Z1159 Encounter for screening for other viral diseases: Secondary | ICD-10-CM | POA: Diagnosis not present

## 2018-10-24 DIAGNOSIS — R809 Proteinuria, unspecified: Secondary | ICD-10-CM | POA: Diagnosis not present

## 2018-10-24 DIAGNOSIS — N051 Unspecified nephritic syndrome with focal and segmental glomerular lesions: Secondary | ICD-10-CM | POA: Diagnosis not present

## 2018-10-24 DIAGNOSIS — I129 Hypertensive chronic kidney disease with stage 1 through stage 4 chronic kidney disease, or unspecified chronic kidney disease: Secondary | ICD-10-CM | POA: Diagnosis not present

## 2018-10-27 ENCOUNTER — Ambulatory Visit (INDEPENDENT_AMBULATORY_CARE_PROVIDER_SITE_OTHER): Payer: Medicare Other

## 2018-10-27 ENCOUNTER — Ambulatory Visit: Payer: Medicare Other

## 2018-10-27 ENCOUNTER — Other Ambulatory Visit: Payer: Self-pay

## 2018-10-27 VITALS — BP 120/80 | Temp 97.0°F | Ht 67.0 in | Wt 198.0 lb

## 2018-10-27 DIAGNOSIS — Z0000 Encounter for general adult medical examination without abnormal findings: Secondary | ICD-10-CM

## 2018-10-27 NOTE — Patient Instructions (Signed)
[name] , Thank you for taking time to come for your Medicare Wellness Visit. I appreciate your ongoing commitment to your health goals. Please review the following plan we discussed and let me know if I can assist you in the future.   Screening recommendations/referrals: Colonoscopy: 08/2010 Recommended yearly ophthalmology/optometry visit for glaucoma screening and checkup Recommended yearly dental visit for hygiene and checkup  Vaccinations: Influenza vaccine: 10/2017 Pneumococcal vaccine: n/a Tdap vaccine: 03/2012 Shingles vaccine: discussed    Advanced directives: Advance directive discussed with you today.  Conditions/risks identified: obesity  Next appointment: 11/03/2018 at 9:45  Preventive Care 40-64 Years, Male Preventive care refers to lifestyle choices and visits with your health care provider that can promote health and wellness. What does preventive care include?  A yearly physical exam. This is also called an annual well check.  Dental exams once or twice a year.  Routine eye exams. Ask your health care provider how often you should have your eyes checked.  Personal lifestyle choices, including:  Daily care of your teeth and gums.  Regular physical activity.  Eating a healthy diet.  Avoiding tobacco and drug use.  Limiting alcohol use.  Practicing safe sex.  Taking low-dose aspirin every day starting at age 37. What happens during an annual well check? The services and screenings done by your health care provider during your annual well check will depend on your age, overall health, lifestyle risk factors, and family history of disease. Counseling  Your health care provider may ask you questions about your:  Alcohol use.  Tobacco use.  Drug use.  Emotional well-being.  Home and relationship well-being.  Sexual activity.  Eating habits.  Work and work Statistician. Screening  You may have the following tests or measurements:  Height,  weight, and BMI.  Blood pressure.  Lipid and cholesterol levels. These may be checked every 5 years, or more frequently if you are over 90 years old.  Skin check.  Lung cancer screening. You may have this screening every year starting at age 16 if you have a 30-pack-year history of smoking and currently smoke or have quit within the past 15 years.  Fecal occult blood test (FOBT) of the stool. You may have this test every year starting at age 66.  Flexible sigmoidoscopy or colonoscopy. You may have a sigmoidoscopy every 5 years or a colonoscopy every 10 years starting at age 72.  Prostate cancer screening. Recommendations will vary depending on your family history and other risks.  Hepatitis C blood test.  Hepatitis B blood test.  Sexually transmitted disease (STD) testing.  Diabetes screening. This is done by checking your blood sugar (glucose) after you have not eaten for a while (fasting). You may have this done every 1-3 years. Discuss your test results, treatment options, and if necessary, the need for more tests with your health care provider. Vaccines  Your health care provider may recommend certain vaccines, such as:  Influenza vaccine. This is recommended every year.  Tetanus, diphtheria, and acellular pertussis (Tdap, Td) vaccine. You may need a Td booster every 10 years.  Zoster vaccine. You may need this after age 28.  Pneumococcal 13-valent conjugate (PCV13) vaccine. You may need this if you have certain conditions and have not been vaccinated.  Pneumococcal polysaccharide (PPSV23) vaccine. You may need one or two doses if you smoke cigarettes or if you have certain conditions. Talk to your health care provider about which screenings and vaccines you need and how often you need  them. This information is not intended to replace advice given to you by your health care provider. Make sure you discuss any questions you have with your health care provider. Document  Released: 03/14/2015 Document Revised: 11/05/2015 Document Reviewed: 12/17/2014 Elsevier Interactive Patient Education  2017 [place] Prevention in the Home Falls can cause injuries. They can happen to people of all ages. There are many things you can do to make your home safe and to help prevent falls. What can I do on the outside of my home?  Regularly fix the edges of walkways and driveways and fix any cracks.  Remove anything that might make you trip as you walk through a door, such as a raised step or threshold.  Trim any bushes or trees on the path to your home.  Use bright outdoor lighting.  Clear any walking paths of anything that might make someone trip, such as rocks or tools.  Regularly check to see if handrails are loose or broken. Make sure that both sides of any steps have handrails.  Any raised decks and porches should have guardrails on the edges.  Have any leaves, snow, or ice cleared regularly.  Use sand or salt on walking paths during winter.  Clean up any spills in your garage right away. This includes oil or grease spills. What can I do in the bathroom?  Use night lights.  Install grab bars by the toilet and in the tub and shower. Do not use towel bars as grab bars.  Use non-skid mats or decals in the tub or shower.  If you need to sit down in the shower, use a plastic, non-slip stool.  Keep the floor dry. Clean up any water that spills on the floor as soon as it happens.  Remove soap buildup in the tub or shower regularly.  Attach bath mats securely with double-sided non-slip rug tape.  Do not have throw rugs and other things on the floor that can make you trip. What can I do in the bedroom?  Use night lights.  Make sure that you have a light by your bed that is easy to reach.  Do not use any sheets or blankets that are too big for your bed. They should not hang down onto the floor.  Have a firm chair that has side arms. You can use  this for support while you get dressed.  Do not have throw rugs and other things on the floor that can make you trip. What can I do in the kitchen?  Clean up any spills right away.  Avoid walking on wet floors.  Keep items that you use a lot in easy-to-reach places.  If you need to reach something above you, use a strong step stool that has a grab bar.  Keep electrical cords out of the way.  Do not use floor polish or wax that makes floors slippery. If you must use wax, use non-skid floor wax.  Do not have throw rugs and other things on the floor that can make you trip. What can I do with my stairs?  Do not leave any items on the stairs.  Make sure that there are handrails on both sides of the stairs and use them. Fix handrails that are broken or loose. Make sure that handrails are as long as the stairways.  Check any carpeting to make sure that it is firmly attached to the stairs. Fix any carpet that is loose or worn.  Avoid  having throw rugs at the top or bottom of the stairs. If you do have throw rugs, attach them to the floor with carpet tape.  Make sure that you have a light switch at the top of the stairs and the bottom of the stairs. If you do not have them, ask someone to add them for you. What else can I do to help prevent falls?  Wear shoes that:  Do not have high heels.  Have rubber bottoms.  Are comfortable and fit you well.  Are closed at the toe. Do not wear sandals.  If you use a stepladder:  Make sure that it is fully opened. Do not climb a closed stepladder.  Make sure that both sides of the stepladder are locked into place.  Ask someone to hold it for you, if possible.  Clearly mark and make sure that you can see:  Any grab bars or handrails.  First and last steps.  Where the edge of each step is.  Use tools that help you move around (mobility aids) if they are needed. These include:  Canes.  Walkers.  Scooters.  Crutches.  Turn on  the lights when you go into a dark area. Replace any light bulbs as soon as they burn out.  Set up your furniture so you have a clear path. Avoid moving your furniture around.  If any of your floors are uneven, fix them.  If there are any pets around you, be aware of where they are.  Review your medicines with your doctor. Some medicines can make you feel dizzy. This can increase your chance of falling. Ask your doctor what other things that you can do to help prevent falls. This information is not intended to replace advice given to you by your health care provider. Make sure you discuss any questions you have with your health care provider. Document Released: 12/12/2008 Document Revised: 07/24/2015 Document Reviewed: 03/22/2014 Elsevier Interactive Patient Education  2017 [employer].

## 2018-10-27 NOTE — Progress Notes (Signed)
PCP notes:  Health Maintenance:  No gaps  Abnormal Screenings:  None  Patient concerns:  Needs more needles for his B12 injections  Nurse concerns:  None  Next PCP appt.: 11/03/2018 at 9:45

## 2018-10-27 NOTE — Progress Notes (Addendum)
Subjective:   Tony Hunt is a 63 y.o. male who presents for Medicare Annual/Subsequent preventive examination.  This visit type was conducted due to national recommendations for restrictions regarding the COVID-19 Pandemic (e.g. social distancing). This format is felt to be most appropriate for this patient at this time. All issues noted in this document were discussed and addressed. No physical exam was performed (except for noted visual exam findings with Video Visits). This patient, Tony Hunt, has given permission to perform this visit via telephone. Vital signs may be absent or patient reported.  Patient location:  At home  Nurse location:  At home     Review of Systems:  n/a Cardiac Risk Factors include: advanced age (>97men, >86 women);hypertension;male gender;dyslipidemia;obesity (BMI >30kg/m2)     Objective:    Vitals: BP 120/80 Comment: per patient  Temp (!) 97 F (36.1 C) Comment: per patient  Ht 5\' 7"  (1.702 m) Comment: per patient  Wt 198 lb (89.8 kg) Comment: per patient  BMI 31.01 kg/m   Body mass index is 31.01 kg/m.  Advanced Directives 10/24/2017 01/06/2017 10/21/2016 05/28/2016 03/31/2015 08/13/2014 04/02/2014  Does Patient Have a Medical Advance Directive? No No No No No No No  Would patient like information on creating a medical advance directive? No - Patient declined - - Yes (MAU/Ambulatory/Procedural Areas - Information given) Yes - Educational materials given No - patient declined information -  Pre-existing out of facility DNR order (yellow form or pink MOST form) - - - - - - -    Tobacco Social History   Tobacco Use  Smoking Status Former Smoker  . Quit date: 09/30/2010  . Years since quitting: 8.0  Smokeless Tobacco Former Engineer, structural given: Not Answered   Clinical Intake:  Pre-visit preparation completed: Yes  Pain : No/denies pain     Nutritional Status: BMI > 30  Obese Nutritional Risks: None Diabetes: No  How often do  you need to have someone help you when you read instructions, pamphlets, or other written materials from your doctor or pharmacy?: 1 - Never What is the last grade level you completed in school?: 12th grade  Interpreter Needed?: No  Information entered by :: NAllen LPN  Past Medical History:  Diagnosis Date  . AAA (abdominal aortic aneurysm) without rupture ([facility]) 02/14/2014   [place] 1/19:  AAA 3 cm  . ALCOHOL ABUSE, HX OF    distant history  . Anxiety    occ panic sx, increased after death of mother  . ANXIETY DEPRESSION 05/10/2007   history of, during difficult relationship  . CAD (coronary artery disease) 08/28/2012   08/25/2012 Successful PTCA/DES x mid LAD // LAD prox 30, mid stent patent; D1 40; LCx ok; OM2 50, 50; RCA prox 20; EF 55-65  // Nuclear stress test 9/19: EF 58, small inferobasal infarct, no ischemia, Low Risk (inf defect ? diaph atten?)   . 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 Carotid artery disease ([facility]) 09/12/2017   [place] 6/19:  R 60-79; L 1-39; R vertebral occluded; FU 12 months  . 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 Chronic diastolic CHF (congestive heart failure) ([facility]) 12/21/2017   Echo 11/17/2017 - Mild concentric LVH, EF 65-70, normal wall motion, grade 2 diastolic dysfunction, mild LAE, normal RVSF, trivial TR  . Dilatation of aorta ([facility]) 02/14/2014  . Diverticulosis   . ESOPHAGEAL STRICTURE 05/10/2007  .  GERD 05/10/2007  . Heart disease   . HIATAL HERNIA 05/10/2007  . History of echocardiogram    Echo 9/19: mild conc LVH, vigorous LVF, EF 65-70, Gr 2 DD, mild LAE, normal RVSF, trivial AI  . HYPERLIPIDEMIA 05/10/2007  . HYPERTENSION 05/10/2007  . 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 SLEEP APNEA 05/10/2007   lost 100lb-not now  . Upper airway cough syndrome    Per Dr. Melvyn Novas, pulmonary    Past Surgical History:  Procedure Laterality Date  . APPENDECTOMY    . BACK SURGERY  1997    lower back x3  . CARPOMETACARPAL ([facility]) FUSION OF THUMB Left 04/02/2014   Procedure: CARPOMETACARPAL ([place]) FUSION OF THUMB/LEFT THUMB INTERPHALNGEAL JOINT FUSION;  Surgeon: Jolyn Nap, MD;  Location: [facility];  Service: Orthopedics;  Laterality: Left;  . COLONOSCOPY    . CORONARY STENT PLACEMENT     2.75 x 12 mm Promus Premier DES was deployed in the mid LAD. The stent was post-dilated with a 3.0 x 9 mm [place] balloon - Mid LAD.  Marland Kitchen LARYNGOSCOPY / BRONCHOSCOPY / ESOPHAGOSCOPY  2012   bx  . LEFT HEART CATH AND CORONARY ANGIOGRAPHY N/A 05/28/2016   Procedure: Left Heart Cath and Coronary Angiography;  Surgeon: Belva Crome, MD;  Location: [facility];  Service: Cardiovascular;  Laterality: N/A;  . LEFT HEART CATHETERIZATION WITH CORONARY ANGIOGRAM N/A 08/25/2012   Procedure: LEFT HEART CATHETERIZATION WITH CORONARY ANGIOGRAM;  Surgeon: Burnell Blanks, MD;  Location: [facility] CATH LAB;  Service: Cardiovascular;  Laterality: N/A;  . LEFT HEART CATHETERIZATION WITH CORONARY ANGIOGRAM N/A 11/23/2013   Procedure: LEFT HEART CATHETERIZATION WITH CORONARY ANGIOGRAM;  Surgeon: Jettie Booze, MD;  Location: [facility] CATH LAB;  Service: Cardiovascular;  Laterality: N/A;  . WRIST FRACTURE SURGERY  08/21/10   Right wrist x4   Family History  Problem Relation Age of Onset  . Brain cancer Father   . Dementia Father        h/o brain tumor  . Heart disease Mother   . Hypertension Mother   . Heart disease Brother   . Hypertension Brother   . Hyperlipidemia Brother   . Colon polyps Brother   . Colon cancer Neg Hx   . Prostate cancer Neg Hx   . Esophageal cancer Neg Hx   . Pancreatic cancer Neg Hx   . Liver disease Neg Hx    Social History   Socioeconomic History  . Marital status: Married    Spouse name: Not on file  . Number of children: 1  . Years of education: Not on file  . Highest education level: Not on file  Occupational History  . 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 Financial resource strain: Not hard at all  . Food insecurity    Worry: Never true    Inability: Never true  . Transportation needs    Medical: No    Non-medical: No  Tobacco Use  . Smoking status: Former Smoker    Quit date: 09/30/2010    Years since quitting: 8.0  . Smokeless tobacco: Former Network engineer and Sexual Activity  . Alcohol use: Not Currently  . Drug use: No  . Sexual activity: Yes  Lifestyle  . Physical activity    Days per week: 3 days    Minutes per session: 40 min  . Stress: Not at all  Relationships  .  Social Herbalist on phone: Not on file    Gets together: Not on file    Attends religious service: Not on file    Active member of club or organization: Not on file    Attends meetings of clubs or organizations: Not on file    Relationship status: Not on file  Other Topics Concern  . Not on file  Social History Narrative   Married 8/13. 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  Noted tinnitus, h/o noise exposure that was service related.     Outpatient Encounter Medications as of 10/27/2018  Medication Sig  . aspirin EC 81 MG tablet Take 1 tablet (81 mg total) by mouth daily.  . clopidogrel (PLAVIX) 75 MG tablet TAKE 1 TABLET DAILY WITH BREAKFAST  . cyanocobalamin (,VITAMIN B-12,) 1000 MCG/ML injection Inject weekly X 4 weeks and then monthly thereafter. Injection start date 08/07/18.  . fenofibrate 160 MG tablet TAKE 1 TABLET DAILY  . gabapentin (NEURONTIN) 100 MG capsule Take 100 mg by mouth as needed (for headache).  . gabapentin (NEURONTIN) 600 MG tablet Take 1 tablet (600 mg total) by mouth at bedtime.  Marland Kitchen levothyroxine (SYNTHROID) 100 MCG tablet TAKE 1 TABLET DAILY BEFORE BREAKFAST  . Melatonin 10 MG TABS Take 10 mg by mouth at bedtime.  . metoprolol tartrate (LOPRESSOR) 25 MG tablet TAKE ONE-HALF (1/2) TABLET TWICE A DAY  .  nitroGLYCERIN (NITROSTAT) 0.4 MG SL tablet Place 1 tablet (0.4 mg total) under the tongue every 5 (five) minutes as needed for chest pain (do not exceed 3 pills during one episode).  . pantoprazole (PROTONIX) 40 MG tablet TAKE 1 TABLET DAILY  . rosuvastatin (CRESTOR) 20 MG tablet Take 1 tablet (20 mg total) by mouth daily.  . SYRINGE-NEEDLE, DISP, 3 ML 25G X 1" 3 ML MISC Patient is to use to self inject 1 ml of vitamin B12 per provider instructions.  . tamsulosin (FLOMAX) 0.4 MG CAPS capsule TAKE 1 CAPSULE DAILY  . tiZANidine (ZANAFLEX) 4 MG tablet Take 1 tablet (4 mg total) by mouth every 6 (six) hours as needed for muscle spasms.  . vancomycin (VANCOCIN) 125 MG capsule Take 1 capsule (125 mg total) by mouth 4 (four) times daily for 10 days, THEN 1 capsule (125 mg total) 2 (two) times daily for 7 days, THEN 1 capsule (125 mg total) daily for 7 days, THEN 1 capsule (125 mg total) every other day for 14 days.  . fish oil-omega-3 fatty acids 1000 MG capsule Take 1 g by mouth daily.   No facility-administered encounter medications on file as of 10/27/2018.     Activities of Daily Living In your present state of health, do you have any difficulty performing the following activities: 10/27/2018  Hearing? N  Vision? N  Difficulty concentrating or making decisions? N  Walking or climbing stairs? N  Dressing or bathing? N  Doing errands, shopping? N  Preparing Food and eating ? N  Using the Toilet? N  In the past six months, have you accidently leaked urine? N  Do you have problems with loss of bowel control? N  Managing your Medications? N  Managing your Finances? N  Housekeeping or managing your Housekeeping? N  Some recent data might be hidden    Patient Care Team: Tonia Ghent, MD as PCP - General (Family Medicine) Nahser, Wonda Cheng, MD as PCP - Cardiology (Cardiology)  Assessment:   This is a routine wellness examination for Tony Hunt.  Exercise Activities and Dietary recommendations  Current Exercise Habits: Home exercise routine, Type of exercise: walking;strength training/weights, Time (Minutes): 40, Frequency (Times/Week): 3, Weekly Exercise (Minutes/Week): 120  Goals    . Increase physical activity     Starting 10/21/16, I will continue to exercise 15-30 min 5 days per week.     . Patient Stated     10/27/2018, no goals set       Fall Risk Fall Risk  10/27/2018 10/24/2017 10/21/2016 10/28/2015 07/02/2013  Falls in the past year? 0 No No No No  Risk for fall due to : Medication side effect - - - -  Follow up Falls evaluation completed;Falls prevention discussed - - - -   Is the patient's home free of loose throw rugs in walkways, pet beds, electrical cords, etc?   yes      Grab bars in the bathroom? no      Handrails on the stairs?  n/a      Adequate lighting?   yes  Timed Get Up and Go Performed: n/a  Depression Screen PHQ 2/9 Scores 10/27/2018 10/24/2017 10/21/2016 10/28/2015  PHQ - 2 Score 0 0 0 0  PHQ- 9 Score 1 0 0 -    Cognitive Function MMSE - Mini Mental State Exam 10/27/2018 10/24/2017 10/21/2016  Orientation to time 4 5 5   Orientation to time comments did not know date - -  Orientation to Place 5 5 5   Registration 3 3 3   Attention/ Calculation 5 0 0  Recall 3 3 2   Recall-comments - - pt was unable to recall 1 of 3 words  Language- name 2 objects 0 0 0  Language- repeat 1 1 1   Language- follow 3 step command 0 3 3  Language- read & follow direction 0 0 0  Write a sentence 0 0 0  Copy design 0 0 0  Total score 21 20 19    Mini Cog  Mini-Cog screen was completed. Maximum score is 22. A value of 0 denotes this part of the MMSE was not completed or the patient failed this part of the Mini-Cog screening.       Immunization History  Administered Date(s) Administered  . Influenza Whole 01/13/2005  . Influenza, Seasonal, Injecte, Preservative Fre 12/06/2014  . Influenza,inj,Quad PF,6+ Mos 10/28/2015, 01/05/2017, 11/03/2017  . Influenza-Unspecified  11/20/2013  . Pneumococcal Polysaccharide-23 07/02/2013  . Td 04/01/2001  . Tdap 03/06/2012  . Zoster 08/01/2014    Qualifies for Shingles Vaccine? yes  Screening Tests Health Maintenance  Topic Date Due  . INFLUENZA VACCINE  09/30/2018  . COLONOSCOPY  09/24/2020  . TETANUS/TDAP  03/06/2022  . Hepatitis C Screening  Completed  . HIV Screening  Completed   Cancer Screenings: Lung: Low Dose CT Chest recommended if Age 49-80 years, 30 pack-year currently smoking OR have quit w/in 15years. Patient does not qualify. Colorectal: up to date  Additional Screenings:  Hepatitis C Screening:09/2016      Plan:   Patient has no goals set at this time.  I have personally reviewed and noted the following in the patient's chart:   . Medical and social history . Use of alcohol, tobacco or illicit drugs  . Current medications and supplements . Functional ability and status . Nutritional status . Physical activity . Advanced directives . List of other physicians . Hospitalizations, surgeries, and ER visits in previous 12 months . Vitals . Screenings to include  cognitive, depression, and falls . Referrals and appointments  In addition, I have reviewed and discussed with patient certain preventive protocols, quality metrics, and best practice recommendations. A written personalized care plan for preventive services as well as general preventive health recommendations were provided to patient.     Kellie Simmering, LPN  [phone]

## 2018-11-02 ENCOUNTER — Ambulatory Visit (INDEPENDENT_AMBULATORY_CARE_PROVIDER_SITE_OTHER): Payer: Medicare Other | Admitting: Cardiovascular Disease

## 2018-11-02 ENCOUNTER — Encounter: Payer: Self-pay | Admitting: Cardiovascular Disease

## 2018-11-02 ENCOUNTER — Other Ambulatory Visit: Payer: Self-pay

## 2018-11-02 ENCOUNTER — Encounter

## 2018-11-02 VITALS — BP 114/68 | HR 67 | Ht 67.0 in | Wt 198.4 lb

## 2018-11-02 DIAGNOSIS — E782 Mixed hyperlipidemia: Secondary | ICD-10-CM

## 2018-11-02 DIAGNOSIS — I6523 Occlusion and stenosis of bilateral carotid arteries: Secondary | ICD-10-CM

## 2018-11-02 DIAGNOSIS — E785 Hyperlipidemia, unspecified: Secondary | ICD-10-CM

## 2018-11-02 DIAGNOSIS — I10 Essential (primary) hypertension: Secondary | ICD-10-CM | POA: Diagnosis not present

## 2018-11-02 DIAGNOSIS — I5032 Chronic diastolic (congestive) heart failure: Secondary | ICD-10-CM | POA: Diagnosis not present

## 2018-11-02 DIAGNOSIS — I2510 Atherosclerotic heart disease of native coronary artery without angina pectoris: Secondary | ICD-10-CM

## 2018-11-02 NOTE — Patient Instructions (Signed)
Medication Instructions:  Your physician has recommended you make the following change in your medication:  STOP Aspirin  If you need a refill on your cardiac medications before your next appointment, please call your pharmacy.    Lab work: None Ordered   Testing/Procedures: None Ordered   Follow-Up: You have been referred to [facility] for management of your high cholesterol   At [facility], you and your health needs are our priority.  As part of our continuing mission to provide you with exceptional heart care, we have created designated Provider Care Teams.  These Care Teams include your primary Cardiologist (physician) and Advanced Practice Providers (APPs -  Physician Assistants and Nurse Practitioners) who all work together to provide you with the care you need, when you need it. You will need a follow up appointment in:  1 years.  Please call our office 2 months in advance to schedule this appointment.  You may see Mertie Moores, MD or one of the following Advanced Practice Providers on your designated Care Team: Richardson Dopp, PA-C [place], [place] . Daune Perch, NP

## 2018-11-02 NOTE — Progress Notes (Signed)
Tony Hunt Date of Birth  1955-03-20       [name] [address], [address], [place], [place]  [address]   [place], [place]  [address] [phone]     [phone]   Fax  [phone]    Fax [phone]  Problem List: 1. CAD -  08/25/12 - 2.75 x 12 mm Promus Premier DES was deployed in the mid LAD. The stent was post-dilated with a 3.0 x 9 mm [place] balloon - Mid LAD. 2. Hypertension 3. Hypothyroidism 4. hyperlipidemia  Previous Notes :   Tony Hunt is a 63 yo with hx of progressive CP.  These pains have progressed over the past several weeks.  Initially he had only one or 2 times per week. These pains have progressed and he now has several episodes of chest pain each day.  The pains are associated with some dyspnea and lightheadness.   They are not associated with exercise, eating, drinking, change of position. The pains seem to last about 1 minute.  He has not found anything that specifically relieves the pain. They tend to resolve spontaneously.  He had a particularly bad episode of chest discomfort early this week and went to the emergency room. His workup there was unremarkable.  He's had progressive angina. He had an episode of chest pain the office today. The there were no associated EKG changes.  August 30, 2012:  Tony Hunt was seen last week with [facility].  He had a stent placed in his mid left anterior descending artery. He was discharged the next day but started having severe headaches and night sweats. Initially he thought it may be due to the Plavix but ultimately he decided that it seemed to be more related to the Pravachol.  He's feeling much better at this time.  Sept. 23, 2014:  He had several weeks of intermittant CP ( lasted 2-3 minutes) after getting his stent.  Now , these have resolved.  He is back doing all of his normal activities without CP.  He joined the [employer] last week.  He has been 1 time.   He has noticed  some easy bleeding.    11/22/2013:  He has been having more angina CP - typically occurs at rest.  Lasts a few seconds.  Occasionally longer.  Relieved with NTG Walking lots. 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   [facility] .   Dec. 18, 2015: And 1 was having some chest pain when I last done in September. Cardiac admission did not reveal any significant stenosis. History of PCI.  Has been doing better. Not exercising as much.  No trouble deer hunting this past fall.   August 27, 2014:  Doing well.  Hunts and fishes regularly . No angina   Jan. 17, 2017:  Tony Hunt is seen for follow up of his CAD' No significant episodes of CP  Still active.    Oct. 16, 2017:  Tony Hunt is seen today for follow up of his CAD. 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  May 26, 2016:  Tony Hunt has had marked dyspnea at rest and with exertion.  No fever, no cough Chest tightness / pain .   radiates through to back .  Last 5-10 minutes, less if he takes SL NTG .  Similar to angina  prior to stenting in 2014  Has been going on for 2 weeks.   Got worse this weekend Took 3 SL NTG.    August 05, 2016:  Seen for follow up of his CAD, hypertension and hyperlipidemia. Still having some dyspnea and DOE Cath showed no significant CAD Former smoker , quit 7 years ago   March 16, 2017:  Tony Hunt is seen today  No CP or dyspnea.   Going to the Y several times a week   Hunted a lot this past winter   Sept. 3, 2020  Tony Hunt is seen today  Hx of CAD - PCI in the past  No CP , no dyspnea Some exercise.  Walks near [place].   Recent labs  Creatinine is mildly elevated, - chronic issue fo rhim  Chol = 184 HDL is 34 LDL = 106    Current Outpatient Medications on File Prior to Visit  Medication Sig Dispense Refill  . aspirin EC 81 MG tablet Take 1 tablet (81 mg total) by mouth daily. 90 tablet 3  .  clopidogrel (PLAVIX) 75 MG tablet TAKE 1 TABLET DAILY WITH BREAKFAST 90 tablet 2  . cyanocobalamin (,VITAMIN B-12,) 1000 MCG/ML injection Inject weekly X 4 weeks and then monthly thereafter. Injection start date 08/07/18. 10 mL 1  . fenofibrate 160 MG tablet TAKE 1 TABLET DAILY 90 tablet 2  . fish oil-omega-3 fatty acids 1000 MG capsule Take 1 g by mouth daily.    Marland Kitchen gabapentin (NEURONTIN) 100 MG capsule Take 100 mg by mouth as needed (for headache).    . gabapentin (NEURONTIN) 600 MG tablet Take 1 tablet (600 mg total) by mouth at bedtime.    Marland Kitchen levothyroxine (SYNTHROID) 100 MCG tablet TAKE 1 TABLET DAILY BEFORE BREAKFAST 90 tablet 2  . Melatonin 10 MG TABS Take 10 mg by mouth at bedtime.    . metoprolol tartrate (LOPRESSOR) 25 MG tablet TAKE ONE-HALF (1/2) TABLET TWICE A DAY 30 tablet 6  . nitroGLYCERIN (NITROSTAT) 0.4 MG SL tablet Place 1 tablet (0.4 mg total) under the tongue every 5 (five) minutes as needed for chest pain (do not exceed 3 pills during one episode). 25 tablet 6  . pantoprazole (PROTONIX) 40 MG tablet TAKE 1 TABLET DAILY 90 tablet 4  . rosuvastatin (CRESTOR) 20 MG tablet Take 1 tablet (20 mg total) by mouth daily. 90 tablet 3  . SYRINGE-NEEDLE, DISP, 3 ML 25G X 1" 3 ML MISC Patient is to use to self inject 1 ml of vitamin B12 per provider instructions. 4 each 3  . tamsulosin (FLOMAX) 0.4 MG CAPS capsule TAKE 1 CAPSULE DAILY 90 capsule 3  . tiZANidine (ZANAFLEX) 4 MG tablet Take 1 tablet (4 mg total) by mouth every 6 (six) hours as needed for muscle spasms. 30 tablet 0  . vancomycin (VANCOCIN) 125 MG capsule Take 1 capsule (125 mg total) by mouth 4 (four) times daily for 10 days, THEN 1 capsule (125 mg total) 2 (two) times daily for 7 days, THEN 1 capsule (125 mg total) daily for 7 days, THEN 1 capsule (125 mg total) every other day for 14 days. 68 capsule 0   No current facility-administered medications on file prior to visit.     Allergies  Allergen Reactions  . Imdur  [Isosorbide Dinitrate] Other (See Comments)    Headache at 60mg  dose, but able to tolerate 30mg  per day  . Iohexol Anaphylaxis and Other (See Comments)    OK with  13 hour prep  . Lisinopril Other (See Comments)    Elevated cr- improved off med  . Pravastatin Nausea Only and Other (See Comments)    Intense headache  . Codeine Itching  . Iodine-131 Other (See Comments)    Passed out Passed out  . Simvastatin Other (See Comments)    Edema  . Statins Other (See Comments)    Sig edema on simvastatin    Past Medical History:  Diagnosis Date  . AAA (abdominal aortic aneurysm) without rupture ([facility]) 02/14/2014   [place] 1/19:  AAA 3 cm  . ALCOHOL ABUSE, HX OF    distant history  . Anxiety    occ panic sx, increased after death of mother  . ANXIETY DEPRESSION 05/10/2007   history of, during difficult relationship  . CAD (coronary artery disease) 08/28/2012   08/25/2012 Successful PTCA/DES x mid LAD // LAD prox 30, mid stent patent; D1 40; LCx ok; OM2 50, 50; RCA prox 20; EF 55-65  // Nuclear stress test 9/19: EF 58, small inferobasal infarct, no ischemia, Low Risk (inf defect ? diaph atten?)   . 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 Carotid artery disease ([facility]) 09/12/2017   [place] 6/19:  R 60-79; L 1-39; R vertebral occluded; FU 12 months  . 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 Chronic diastolic CHF (congestive heart failure) ([facility]) 12/21/2017   Echo 11/17/2017 - Mild concentric LVH, EF 65-70, normal wall motion, grade 2 diastolic dysfunction, mild LAE, normal RVSF, trivial TR  . Dilatation of aorta ([facility]) 02/14/2014  . Diverticulosis   . ESOPHAGEAL STRICTURE 05/10/2007  . GERD 05/10/2007  . Heart disease   . HIATAL HERNIA 05/10/2007  . History of echocardiogram    Echo 9/19: mild conc LVH, vigorous LVF, EF 65-70, Gr 2 DD, mild LAE, normal RVSF, trivial AI  . HYPERLIPIDEMIA 05/10/2007  . HYPERTENSION 05/10/2007  . 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 SLEEP APNEA 05/10/2007   lost 100lb-not now  . Upper airway cough syndrome    Per Dr. Melvyn Novas, pulmonary     Past Surgical History:  Procedure Laterality Date  . APPENDECTOMY    . BACK SURGERY  1997   lower back x3  . CARPOMETACARPAL ([facility]) FUSION OF THUMB Left 04/02/2014   Procedure: CARPOMETACARPAL ([facility]) FUSION OF THUMB/LEFT THUMB INTERPHALNGEAL JOINT FUSION;  Surgeon: Jolyn Nap, MD;  Location: [facility];  Service: Orthopedics;  Laterality: Left;  . COLONOSCOPY    . CORONARY STENT PLACEMENT     2.75 x 12 mm Promus Premier DES was deployed in the mid LAD. The stent was post-dilated with a 3.0 x 9 mm [place] balloon - Mid LAD.  Marland Kitchen LARYNGOSCOPY / BRONCHOSCOPY / ESOPHAGOSCOPY  2012   bx  . LEFT HEART CATH AND CORONARY ANGIOGRAPHY N/A 05/28/2016   Procedure: Left Heart Cath and Coronary Angiography;  Surgeon: Belva Crome, MD;  Location: [facility];  Service: Cardiovascular;  Laterality: N/A;  . LEFT HEART CATHETERIZATION WITH CORONARY ANGIOGRAM N/A 08/25/2012   Procedure: LEFT HEART CATHETERIZATION WITH CORONARY ANGIOGRAM;  Surgeon: Burnell Blanks, MD;  Location: [facility] CATH LAB;  Service: Cardiovascular;  Laterality: N/A;  . LEFT HEART CATHETERIZATION WITH CORONARY ANGIOGRAM N/A 11/23/2013   Procedure: LEFT HEART CATHETERIZATION WITH CORONARY ANGIOGRAM;  Surgeon: Jettie Booze, MD;  Location: [facility] CATH LAB;  Service: Cardiovascular;  Laterality: N/A;  . WRIST FRACTURE SURGERY  08/21/10   Right wrist x4    Social History   Tobacco Use  Smoking Status Former Smoker  . Quit date: 09/30/2010  . Years since quitting: 8.0  Smokeless Tobacco 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  Social History   Substance and Sexual Activity  Alcohol Use Not Currently    Family History  Problem Relation Age of Onset  . Brain cancer Father   . Dementia Father        h/o  brain tumor  . Heart disease Mother   . Hypertension Mother   . Heart disease Brother   . Hypertension Brother   . Hyperlipidemia Brother   . Colon polyps Brother   . Colon cancer Neg Hx   . Prostate cancer Neg Hx   . Esophageal cancer Neg Hx   . Pancreatic cancer Neg Hx   . Liver disease Neg Hx     Reviw of Systems:  Reviewed in the HPI.  All other systems are negative.  Physical Exam: There were no vitals taken for this visit.  GEN:  Well nourished, well developed in no acute distress HEENT: Normal NECK: No JVD; No carotid bruits LYMPHATICS: No lymphadenopathy CARDIAC: RRR   RESPIRATORY:  Clear to auscultation without rales, wheezing or rhonchi  ABDOMEN: Soft, non-tender, non-distended MUSCULOSKELETAL:  No edema; No deformity  SKIN: Warm and dry NEUROLOGIC:  Alert and oriented x 3    ECG:   Sept. 3, 2020   NSR at 42.  Old Inf. MI , inc. RBBB     Assessment / Plan:   1. CAD -  08/25/12 - 2.75 x 12 mm Promus Premier DES. Has lots of bruising.   Will DC ASA , contine plavix   2. Hypertension - BP is ok   3. Hypothyroidism:  Followed by primary md    4. Hyperlipidemia: on crestor , LDL is still 106.  Will refer to lipid clinic for consideration of PCSK-9 inhibitor   5.  PAD:   He follow with [facility].   Has an AAA.  Also has carotid disease.    I would be happy for [facility] to follow his carotid disease along with his AAA.  Return to see the APP in 6 months.  I will see him again in 1 year.   Mertie Moores, MD  11/02/2018 9:06 AM    [place],  [place],   [id number] Pager [phone] Phone: [phone]; Fax: [phone]

## 2018-11-03 ENCOUNTER — Encounter: Payer: Self-pay | Admitting: Family Medicine

## 2018-11-03 ENCOUNTER — Ambulatory Visit (INDEPENDENT_AMBULATORY_CARE_PROVIDER_SITE_OTHER): Payer: Medicare Other | Admitting: Family Medicine

## 2018-11-03 VITALS — BP 122/60 | HR 63 | Temp 98.0°F | Ht 67.0 in | Wt 198.2 lb

## 2018-11-03 DIAGNOSIS — I6523 Occlusion and stenosis of bilateral carotid arteries: Secondary | ICD-10-CM

## 2018-11-03 DIAGNOSIS — R399 Unspecified symptoms and signs involving the genitourinary system: Secondary | ICD-10-CM | POA: Diagnosis not present

## 2018-11-03 DIAGNOSIS — Z0000 Encounter for general adult medical examination without abnormal findings: Secondary | ICD-10-CM

## 2018-11-03 DIAGNOSIS — E538 Deficiency of other specified B group vitamins: Secondary | ICD-10-CM | POA: Diagnosis not present

## 2018-11-03 DIAGNOSIS — Z23 Encounter for immunization: Secondary | ICD-10-CM

## 2018-11-03 DIAGNOSIS — E039 Hypothyroidism, unspecified: Secondary | ICD-10-CM

## 2018-11-03 DIAGNOSIS — I2510 Atherosclerotic heart disease of native coronary artery without angina pectoris: Secondary | ICD-10-CM

## 2018-11-03 DIAGNOSIS — E7849 Other hyperlipidemia: Secondary | ICD-10-CM

## 2018-11-03 DIAGNOSIS — A0471 Enterocolitis due to Clostridium difficile, recurrent: Secondary | ICD-10-CM | POA: Diagnosis not present

## 2018-11-03 DIAGNOSIS — Z7189 Other specified counseling: Secondary | ICD-10-CM

## 2018-11-03 MED ORDER — CYANOCOBALAMIN 1000 MCG/ML IJ SOLN: INTRAMUSCULAR | 1 refills | Status: DC

## 2018-11-03 MED ORDER — "SYRINGE/NEEDLE (DISP) 25G X 1"" 3 ML MISC": 1 refills | Status: DC

## 2018-11-03 NOTE — Progress Notes (Signed)
Flu 2020 Shingrix d/w pt  PNA not due Tetanus 2014 Colonoscopy 2012 Prostate cancer screening 2020, d/w pt. PSA wnl.   Advance directive- wife designated if patient were incapacitated.  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  HCV screening prev neg.   Hearing screen d/w pt.  Declined hearing aids.    Hypothyroidism.  No neck mass, d/w pt about dysphagia- occ sticking with dry pills.  He is careful with food.  This was noted in the last few months.  He has f/u with ENT pending.  He'll ask them about it.  TSH wnl 2020.    C diff.  On taper vanc. He clearly feels better.  No FCANVD.  No mucous in stools.  Normal stools.  abd bloating is better.   LUTS.  He couldn't tell much difference on flomax, d/w pt about trial off medicine.   B12 def.  He got on replacement and the tingling clearly got better in his hands.  He changed to monthly and the same sx returned.  No weakness.  Has been compliant with medications.  Elevated Cholesterol: Using medications without problems: yes Muscle aches: no Diet compliance: yes Exercise:yes Labs d/w pt.  He has lipid clinic f/u pending.    CAD.   Using medication without problems or lightheadedness: yes Chest pain with exertion: no Edema:no Short of breath:no Labs d/w pt.    PMH and SH reviewed  ROS: Per HPI unless specifically indicated in ROS section   Meds, vitals, and allergies reviewed.   GEN: nad, alert and oriented HEENT: ncat NECK: supple w/o LA, no TMG CV: rrr.  no murmur PULM: ctab, no inc wob ABD: soft, +bs, not tender to palpation EXT: no edema SKIN: no acute rash

## 2018-11-03 NOTE — Patient Instructions (Addendum)
Ask ENT about the swallowing changes.  Update me as needed.   You can try tapering flomax.  If not worse off med, then stop.   Restart weekly B12 shots.  Recheck a level in about 1 month.   Finish the vancomycin taper and let me know if you have any more troubles.  Take care.  Glad to see you.

## 2018-11-04 ENCOUNTER — Other Ambulatory Visit: Payer: Self-pay | Admitting: Family Medicine

## 2018-11-06 DIAGNOSIS — E538 Deficiency of other specified B group vitamins: Secondary | ICD-10-CM | POA: Insufficient documentation

## 2018-11-06 NOTE — Assessment & Plan Note (Signed)
Advance directive- wife designated if patient were incapacitated.  

## 2018-11-06 NOTE — Assessment & Plan Note (Signed)
No chest pain.  Doing well.  See above about lipids.  Update me as needed.  He agrees.

## 2018-11-06 NOTE — Assessment & Plan Note (Signed)
Labs discussed with patient.  He has lipid clinic follow-up pending.  I will defer and await those notes.  He agrees.

## 2018-11-06 NOTE — Assessment & Plan Note (Signed)
Flu 2020 Shingrix d/w pt  PNA not due Tetanus 2014 Colonoscopy 2012 Prostate cancer screening 2020, d/w pt. PSA wnl.   Advance directive- wife designated if patient were incapacitated.  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  HCV screening prev neg.   Hearing screen d/w pt.  Declined hearing aids.

## 2018-11-06 NOTE — Assessment & Plan Note (Signed)
On taper vanc. He clearly feels better.  No FCANVD.  No mucous in stools.  Normal stools.  abd bloating is better.  Discussed rationale for taper and he will update me if he does not have complete resolution of symptoms when he is off the medication.

## 2018-11-06 NOTE — Assessment & Plan Note (Signed)
History of LUTS.  He couldn't tell much difference on flomax, d/w pt about trial off medicine.  He can update me as needed.  He can try a taper off of Flomax.

## 2018-11-06 NOTE — Assessment & Plan Note (Signed)
B12 def.  He got on replacement and the tingling clearly got better in his hands.  He changed to monthly and the same sx returned.  No weakness.  Has been compliant with medications.   Restart weekly B12 shots.  Recheck a level in about 1 month.  He agrees.

## 2018-11-06 NOTE — Assessment & Plan Note (Signed)
No neck mass, d/w pt about dysphagia- occ sticking with dry pills.  He is careful with food.  This was noted in the last few months.  He has f/u with ENT pending.  He'll ask them about it.  TSH wnl 2020.

## 2018-11-22 NOTE — Progress Notes (Signed)
Patient ID: Tony Hunt                 DOB: [birth year]                    MRN: [id number]     HPI: Tony Hunt is a 63 y.o. male patient referred to lipid clinic by Tony Hunt. PMH is significant for CAD s/p stent, HTN, hypothyroidsim, HLD, prostate cancer and recurrent cdiff. Patient is on high intensity statin and LDL is still above goal.  Patient presents to lipid clinic for discussion of PCSK9 inhibitor.   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 He states that sweets are his weakness. He also eats a lot of pasta. He tries not to buy sweets, but he buys them for his grandkids and then when they leave they are still in his house.  Uses B12 injections and is comfortable doing injections.  Current Medications: fenofibrate 160mg  daily, rosuvastatin 20mg  daily Risk Factors: CAD LDL goal: <70  Diet: sweets is his weakness, eats a lot of pastas  Social History: former smoker, no ETOH  Labs: 10/20/2018 TC 184, TG 209, HDL 34, LDL 106  Past Medical History:  Diagnosis Date  . AAA (abdominal aortic aneurysm) without rupture ([facility]) 02/14/2014   [place] 1/19:  AAA 3 cm  . ALCOHOL ABUSE, HX OF    distant history  . Anxiety    occ panic sx, increased after death of mother  . ANXIETY DEPRESSION 05/10/2007   history of, during difficult relationship  . CAD (coronary artery disease) 08/28/2012   08/25/2012 Successful PTCA/DES x mid LAD // LAD prox 30, mid stent patent; D1 40; LCx ok; OM2 50, 50; RCA prox 20; EF 55-65  // Nuclear stress test 9/19: EF 58, small inferobasal infarct, no ischemia, Low Risk (inf defect ? diaph atten?)   . 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 Carotid artery disease ([facility]) 09/12/2017   [place] 6/19:  R 60-79; L 1-39; R vertebral occluded; FU 12 months  . 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 Chronic diastolic CHF (congestive heart failure) ([facility]) 12/21/2017   Echo 11/17/2017 - Mild concentric LVH, EF 65-70, normal wall motion, grade 2 diastolic dysfunction, mild LAE, normal RVSF, trivial TR  . Dilatation of aorta ([facility]) 02/14/2014  . Diverticulosis   . ESOPHAGEAL STRICTURE 05/10/2007  . GERD 05/10/2007  . Heart disease   . HIATAL HERNIA 05/10/2007  . History of echocardiogram    Echo 9/19: mild conc LVH, vigorous LVF, EF 65-70, Gr 2 DD, mild LAE, normal RVSF, trivial AI  . HYPERLIPIDEMIA 05/10/2007  . HYPERTENSION 05/10/2007  . 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 SLEEP APNEA 05/10/2007   lost 100lb-not now  . Upper airway cough syndrome    Per Dr. Melvyn Novas, pulmonary     Current Outpatient Medications on File Prior to Visit  Medication Sig Dispense Refill  . clopidogrel (PLAVIX) 75 MG tablet TAKE 1 TABLET DAILY WITH BREAKFAST 90 tablet 2  . cyanocobalamin (,VITAMIN B-12,) 1000 MCG/ML injection Inject weekly X 4 weeks and then monthly thereafter. Dispense 10 of the 40ml vials 10 mL 1  . fenofibrate 160 MG tablet TAKE 1 TABLET DAILY 90 tablet 2  . gabapentin (NEURONTIN) 100 MG capsule Take 100 mg by mouth as needed (for headache).    . gabapentin (NEURONTIN) 600 MG tablet Take  1 tablet (600 mg total) by mouth at bedtime.    Marland Kitchen levothyroxine (SYNTHROID) 100 MCG tablet TAKE 1 TABLET DAILY BEFORE BREAKFAST 90 tablet 2  . Melatonin 10 MG TABS Take 10 mg by mouth at bedtime.    . metoprolol tartrate (LOPRESSOR) 25 MG tablet TAKE ONE-HALF (1/2) TABLET TWICE A DAY 30 tablet 6  . nitroGLYCERIN (NITROSTAT) 0.4 MG SL tablet Place 1 tablet (0.4 mg total) under the tongue every 5 (five) minutes as needed for chest pain (do not exceed 3 pills during one episode). 25 tablet 6  . pantoprazole (PROTONIX) 40 MG tablet TAKE 1 TABLET DAILY 90 tablet 4  . rosuvastatin (CRESTOR) 20 MG tablet Take 1 tablet (20 mg total) by mouth daily. 90 tablet 3  . SYRINGE-NEEDLE, DISP, 3 ML 25G X 1" 3 ML MISC Patient is to use to self inject 1 ml of vitamin B12 per provider  instructions. 10 each 1  . tamsulosin (FLOMAX) 0.4 MG CAPS capsule TAKE 1 CAPSULE DAILY 90 capsule 3  . tiZANidine (ZANAFLEX) 4 MG tablet Take 1 tablet (4 mg total) by mouth every 6 (six) hours as needed for muscle spasms. 30 tablet 0  . vancomycin (VANCOCIN) 125 MG capsule Take 1 capsule (125 mg total) by mouth 4 (four) times daily for 10 days, THEN 1 capsule (125 mg total) 2 (two) times daily for 7 days, THEN 1 capsule (125 mg total) daily for 7 days, THEN 1 capsule (125 mg total) every other day for 14 days. 68 capsule 0   No current facility-administered medications on file prior to visit.     Allergies  Allergen Reactions  . Imdur [Isosorbide Dinitrate] Other (See Comments)    Headache at 60mg  dose, but able to tolerate 30mg  per day  . Iohexol Anaphylaxis and Other (See Comments)    OK with 13 hour prep  . Lisinopril Other (See Comments)    Elevated cr- improved off med  . Pravastatin Nausea Only and Other (See Comments)    Intense headache  . Codeine Itching  . Iodine-131 Other (See Comments)    Passed out Passed out  . Simvastatin Other (See Comments)    Edema  . Statins Other (See Comments)    Sig edema on simvastatin    Assessment/Plan:  1. Hyperlipidemia - LDL is above goal of <70 and TG above goal of <150. Discussed the LDL lowering and cardiovascular risk reduction provided by PCSK9 inhibitors. Patient is agreeable to start [place]. Reviewed injection technique and side effects. Continue rosuvastatin and fenofibrate. Also discussed elevated TG. Discussed Vascepa and its cardiovascular risk reduction and ability to provide plaque regression. Patient would like to work on diet first. Will submit PA for [place]. Will call patient once approved and cost is known. Per patient ok to speak with his wife if he is not home. Patient comfortable doing first injection by himself.   Thank you,  Ramond Dial, Pharm.D, [place] [facility]  [id number],  [place], [place] [address]  Phone: [phone]; Fax: [phone]

## 2018-11-23 ENCOUNTER — Encounter: Payer: Self-pay | Admitting: Pharmacist

## 2018-11-23 ENCOUNTER — Ambulatory Visit (INDEPENDENT_AMBULATORY_CARE_PROVIDER_SITE_OTHER): Payer: Medicare Other | Admitting: Pharmacist

## 2018-11-23 ENCOUNTER — Telehealth: Payer: Self-pay | Admitting: Pharmacist

## 2018-11-23 ENCOUNTER — Other Ambulatory Visit: Payer: Self-pay

## 2018-11-23 DIAGNOSIS — E7849 Other hyperlipidemia: Secondary | ICD-10-CM | POA: Diagnosis not present

## 2018-11-23 DIAGNOSIS — I6523 Occlusion and stenosis of bilateral carotid arteries: Secondary | ICD-10-CM

## 2018-11-23 MED ORDER — REPATHA SURECLICK 140 MG/ML ~~LOC~~ SOAJ: 1.0000 "pen " | SUBCUTANEOUS | 3 refills | Status: DC

## 2018-11-23 NOTE — Telephone Encounter (Signed)
PA for [place] approved through 11/23/2019 Patient made aware [facility]ds [facility]

## 2018-11-23 NOTE — Patient Instructions (Signed)
It was a pleasure meeting you today  I will submit a prior authorization to [place] for [place]. I will call you once it is approved and with the cost.  Try to limit the amount of sweets and pasta that you eat. Avoid regular sodas.  Call us at [phone] with any questions or concerns

## 2018-11-28 DIAGNOSIS — R51 Headache: Secondary | ICD-10-CM | POA: Diagnosis not present

## 2018-11-28 DIAGNOSIS — M542 Cervicalgia: Secondary | ICD-10-CM | POA: Diagnosis not present

## 2018-11-28 DIAGNOSIS — G518 Other disorders of facial nerve: Secondary | ICD-10-CM | POA: Diagnosis not present

## 2018-11-28 DIAGNOSIS — M791 Myalgia, unspecified site: Secondary | ICD-10-CM | POA: Diagnosis not present

## 2018-12-04 ENCOUNTER — Other Ambulatory Visit (INDEPENDENT_AMBULATORY_CARE_PROVIDER_SITE_OTHER): Payer: Medicare Other

## 2018-12-04 DIAGNOSIS — E538 Deficiency of other specified B group vitamins: Secondary | ICD-10-CM | POA: Diagnosis not present

## 2018-12-04 LAB — VITAMIN B12: Vitamin B-12: 902 pg/mL (ref 211–911)

## 2018-12-08 ENCOUNTER — Telehealth: Payer: Self-pay

## 2018-12-08 ENCOUNTER — Other Ambulatory Visit: Payer: Self-pay | Admitting: Family Medicine

## 2018-12-08 DIAGNOSIS — E538 Deficiency of other specified B group vitamins: Secondary | ICD-10-CM

## 2018-12-08 MED ORDER — CYANOCOBALAMIN 1000 MCG/ML IJ SOLN: INTRAMUSCULAR | 1 refills | Status: DC

## 2018-12-08 NOTE — Telephone Encounter (Signed)
Left message for patient to call back about lab results.

## 2018-12-12 DIAGNOSIS — Z87891 Personal history of nicotine dependence: Secondary | ICD-10-CM | POA: Diagnosis not present

## 2018-12-12 DIAGNOSIS — Z8521 Personal history of malignant neoplasm of larynx: Secondary | ICD-10-CM | POA: Diagnosis not present

## 2018-12-12 DIAGNOSIS — Z7289 Other problems related to lifestyle: Secondary | ICD-10-CM | POA: Diagnosis not present

## 2018-12-12 DIAGNOSIS — J342 Deviated nasal septum: Secondary | ICD-10-CM | POA: Diagnosis not present

## 2018-12-12 DIAGNOSIS — R252 Cramp and spasm: Secondary | ICD-10-CM | POA: Diagnosis not present

## 2018-12-18 ENCOUNTER — Other Ambulatory Visit: Payer: Self-pay | Admitting: *Deleted

## 2018-12-18 DIAGNOSIS — E538 Deficiency of other specified B group vitamins: Secondary | ICD-10-CM

## 2018-12-18 MED ORDER — CYANOCOBALAMIN 1000 MCG/ML IJ SOLN: INTRAMUSCULAR | 1 refills | Status: DC

## 2018-12-25 ENCOUNTER — Other Ambulatory Visit: Payer: Self-pay | Admitting: Family Medicine

## 2019-01-01 ENCOUNTER — Other Ambulatory Visit: Payer: Self-pay | Admitting: Cardiovascular Disease

## 2019-01-01 DIAGNOSIS — L814 Other melanin hyperpigmentation: Secondary | ICD-10-CM | POA: Diagnosis not present

## 2019-01-01 DIAGNOSIS — D485 Neoplasm of uncertain behavior of skin: Secondary | ICD-10-CM | POA: Diagnosis not present

## 2019-01-01 DIAGNOSIS — L218 Other seborrheic dermatitis: Secondary | ICD-10-CM | POA: Diagnosis not present

## 2019-01-01 DIAGNOSIS — D225 Melanocytic nevi of trunk: Secondary | ICD-10-CM | POA: Diagnosis not present

## 2019-01-01 DIAGNOSIS — L820 Inflamed seborrheic keratosis: Secondary | ICD-10-CM | POA: Diagnosis not present

## 2019-01-01 DIAGNOSIS — L821 Other seborrheic keratosis: Secondary | ICD-10-CM | POA: Diagnosis not present

## 2019-01-01 DIAGNOSIS — L570 Actinic keratosis: Secondary | ICD-10-CM | POA: Diagnosis not present

## 2019-01-02 ENCOUNTER — Telehealth: Payer: Self-pay

## 2019-01-02 NOTE — Telephone Encounter (Signed)
[facility] for lipid labs °

## 2019-01-09 DIAGNOSIS — G518 Other disorders of facial nerve: Secondary | ICD-10-CM | POA: Diagnosis not present

## 2019-01-09 DIAGNOSIS — M791 Myalgia, unspecified site: Secondary | ICD-10-CM | POA: Diagnosis not present

## 2019-01-09 DIAGNOSIS — M542 Cervicalgia: Secondary | ICD-10-CM | POA: Diagnosis not present

## 2019-01-09 DIAGNOSIS — R519 Headache, unspecified: Secondary | ICD-10-CM | POA: Diagnosis not present

## 2019-01-30 DIAGNOSIS — L570 Actinic keratosis: Secondary | ICD-10-CM | POA: Diagnosis not present

## 2019-01-30 DIAGNOSIS — L728 Other follicular cysts of the skin and subcutaneous tissue: Secondary | ICD-10-CM | POA: Diagnosis not present

## 2019-02-12 ENCOUNTER — Other Ambulatory Visit: Payer: Self-pay

## 2019-02-12 DIAGNOSIS — E538 Deficiency of other specified B group vitamins: Secondary | ICD-10-CM

## 2019-02-12 MED ORDER — "SYRINGE/NEEDLE (DISP) 25G X 1"" 3 ML MISC": 1 refills | Status: DC

## 2019-02-12 NOTE — Telephone Encounter (Signed)
Pt left v/m requesting refill muscle relaxer to [facility]. Pt last seen for annual on 11/03/18. Last refilled tizanidine 4 mg #30 on 08/10/18.  Pt also left v/m ;that [facility] not [facility] syringes for B12 shots but [facility] will send the B 12 solution.  Pt request syringes for B 12 be sent to [facility]; done.

## 2019-02-13 MED ORDER — TIZANIDINE HCL 4 MG PO TABS: 4.0000 mg | ORAL_TABLET | Freq: Four times a day (QID) | ORAL | 1 refills | Status: DC | PRN

## 2019-02-13 NOTE — Telephone Encounter (Signed)
Tizanidine sent. Thanks.

## 2019-02-27 ENCOUNTER — Other Ambulatory Visit: Payer: Self-pay | Admitting: Physician Assistant

## 2019-03-03 ENCOUNTER — Other Ambulatory Visit: Payer: Self-pay | Admitting: Physician Assistant

## 2019-03-07 ENCOUNTER — Telehealth: Payer: Self-pay

## 2019-03-07 NOTE — Telephone Encounter (Signed)
Agreed.  Thanks.  

## 2019-03-07 NOTE — Telephone Encounter (Signed)
Pt said for 2 days has had chills, ? Fever (pt does not have way to ck temp), muscle and joint pain which is new symptom;H/A for 2 days with pain level of 10, SOB where he cannot catch his breath even when sitting. Loss of taste,. Fatigue  And pt just does not feel good. Advised pt to go to UC or ED and pt said he only wants a covid test; Dr Damita Dunnings out of office and I asked Dr Lorelei Pont and he said with these symptoms pt needs to go to ED now and they will eval and do necessary testing. Pt voiced understanding and will go to [facility] ED now. FYI to Dr Lorelei Pont and Dr Damita Dunnings.

## 2019-03-07 NOTE — Telephone Encounter (Signed)
Completely agree.

## 2019-03-08 ENCOUNTER — Emergency Department (HOSPITAL_COMMUNITY)
Admission: EM | Admit: 2019-03-08 | Discharge: 2019-03-08 | Disposition: A | Discharge status: Home / Self Care | Payer: Medicare Other | Attending: Emergency Medicine | Admitting: Emergency Medicine

## 2019-03-08 ENCOUNTER — Other Ambulatory Visit: Payer: Self-pay

## 2019-03-08 ENCOUNTER — Emergency Department (HOSPITAL_COMMUNITY): Payer: Medicare Other

## 2019-03-08 DIAGNOSIS — Z87891 Personal history of nicotine dependence: Secondary | ICD-10-CM | POA: Insufficient documentation

## 2019-03-08 DIAGNOSIS — I2510 Atherosclerotic heart disease of native coronary artery without angina pectoris: Secondary | ICD-10-CM | POA: Diagnosis not present

## 2019-03-08 DIAGNOSIS — Z20822 Contact with and (suspected) exposure to covid-19: Secondary | ICD-10-CM | POA: Diagnosis not present

## 2019-03-08 DIAGNOSIS — R5383 Other fatigue: Secondary | ICD-10-CM | POA: Diagnosis not present

## 2019-03-08 DIAGNOSIS — R0602 Shortness of breath: Secondary | ICD-10-CM | POA: Diagnosis not present

## 2019-03-08 DIAGNOSIS — R10812 Left upper quadrant abdominal tenderness: Secondary | ICD-10-CM | POA: Diagnosis not present

## 2019-03-08 DIAGNOSIS — E039 Hypothyroidism, unspecified: Secondary | ICD-10-CM | POA: Insufficient documentation

## 2019-03-08 DIAGNOSIS — Z8521 Personal history of malignant neoplasm of larynx: Secondary | ICD-10-CM | POA: Insufficient documentation

## 2019-03-08 DIAGNOSIS — K5792 Diverticulitis of intestine, part unspecified, without perforation or abscess without bleeding: Secondary | ICD-10-CM | POA: Insufficient documentation

## 2019-03-08 DIAGNOSIS — Z79899 Other long term (current) drug therapy: Secondary | ICD-10-CM | POA: Insufficient documentation

## 2019-03-08 DIAGNOSIS — I5032 Chronic diastolic (congestive) heart failure: Secondary | ICD-10-CM | POA: Insufficient documentation

## 2019-03-08 DIAGNOSIS — M7918 Myalgia, other site: Secondary | ICD-10-CM | POA: Insufficient documentation

## 2019-03-08 DIAGNOSIS — Z7901 Long term (current) use of anticoagulants: Secondary | ICD-10-CM | POA: Diagnosis not present

## 2019-03-08 DIAGNOSIS — R001 Bradycardia, unspecified: Secondary | ICD-10-CM | POA: Diagnosis not present

## 2019-03-08 DIAGNOSIS — R1032 Left lower quadrant pain: Secondary | ICD-10-CM | POA: Diagnosis present

## 2019-03-08 LAB — CBC
HCT: 44.7 % (ref 39.0–52.0)
Hemoglobin: 15.2 g/dL (ref 13.0–17.0)
MCH: 31.2 pg (ref 26.0–34.0)
MCHC: 34 g/dL (ref 30.0–36.0)
MCV: 91.8 fL (ref 80.0–100.0)
Platelets: 302 10*3/uL (ref 150–400)
RBC: 4.87 MIL/uL (ref 4.22–5.81)
RDW: 12.6 % (ref 11.5–15.5)
WBC: 6 10*3/uL (ref 4.0–10.5)
nRBC: 0 % (ref 0.0–0.2)

## 2019-03-08 LAB — URINALYSIS, ROUTINE W REFLEX MICROSCOPIC
Bacteria, UA: NONE SEEN
Bilirubin Urine: NEGATIVE
Glucose, UA: NEGATIVE mg/dL
Hgb urine dipstick: NEGATIVE
Ketones, ur: NEGATIVE mg/dL
Leukocytes,Ua: NEGATIVE
Nitrite: NEGATIVE
Protein, ur: 30 mg/dL — AB
Specific Gravity, Urine: 1.004 — ABNORMAL LOW (ref 1.005–1.030)
pH: 6 (ref 5.0–8.0)

## 2019-03-08 LAB — COMPREHENSIVE METABOLIC PANEL
ALT: 17 U/L (ref 0–44)
AST: 25 U/L (ref 15–41)
Albumin: 3.8 g/dL (ref 3.5–5.0)
Alkaline Phosphatase: 36 U/L — ABNORMAL LOW (ref 38–126)
Anion gap: 10 (ref 5–15)
BUN: 22 mg/dL (ref 8–23)
CO2: 25 mmol/L (ref 22–32)
Calcium: 10.3 mg/dL (ref 8.9–10.3)
Chloride: 106 mmol/L (ref 98–111)
Creatinine, Ser: 1.75 mg/dL — ABNORMAL HIGH (ref 0.61–1.24)
GFR calc Af Amer: 47 mL/min — ABNORMAL LOW (ref >60)
GFR calc non Af Amer: 40 mL/min — ABNORMAL LOW (ref >60)
Glucose, Bld: 94 mg/dL (ref 70–99)
Potassium: 4.4 mmol/L (ref 3.5–5.1)
Sodium: 141 mmol/L (ref 135–145)
Total Bilirubin: 0.6 mg/dL (ref 0.3–1.2)
Total Protein: 6.7 g/dL (ref 6.5–8.1)

## 2019-03-08 LAB — LIPASE, BLOOD: Lipase: 23 U/L (ref 11–51)

## 2019-03-08 LAB — RESPIRATORY PANEL BY RT PCR (FLU A&B, COVID)
Influenza A by PCR: NEGATIVE
Influenza B by PCR: NEGATIVE
SARS Coronavirus 2 by RT PCR: NEGATIVE

## 2019-03-08 MED ORDER — SODIUM CHLORIDE 0.9% FLUSH: 3.0000 mL | Freq: Once | INTRAVENOUS | Status: DC

## 2019-03-08 MED ORDER — CIPROFLOXACIN HCL 500 MG PO TABS
500.0000 mg | ORAL_TABLET | Freq: Once | ORAL | Status: AC
  Administered 2019-03-08: 500 mg via ORAL
  Filled 2019-03-08: qty 1

## 2019-03-08 MED ORDER — OXYCODONE-ACETAMINOPHEN 5-325 MG PO TABS
1.0000 | ORAL_TABLET | Freq: Once | ORAL | Status: AC
  Administered 2019-03-08: 1 via ORAL
  Filled 2019-03-08: qty 1

## 2019-03-08 MED ORDER — METRONIDAZOLE 500 MG PO TABS
500.0000 mg | ORAL_TABLET | Freq: Once | ORAL | Status: AC
  Administered 2019-03-08: 500 mg via ORAL
  Filled 2019-03-08: qty 1

## 2019-03-08 MED ORDER — AMOXICILLIN-POT CLAVULANATE 875-125 MG PO TABS: 1.0000 | ORAL_TABLET | Freq: Two times a day (BID) | ORAL | 0 refills | Status: DC

## 2019-03-08 NOTE — ED Notes (Signed)
Pt ambulatory with steady gait to lobby.

## 2019-03-08 NOTE — ED Provider Notes (Signed)
[facility] EMERGENCY DEPARTMENT Provider Note   CSN: [id number] Arrival date & time: 03/08/19  0747     History Chief Complaint  Patient presents with  . Shortness of Breath  . Abdominal Pain    Tony Hunt is a 64 y.o. male.  HPI    64 year old male with a few different complaints.  He has had about a 2-day history of generally not feeling well.  Chills and body aches.  Some shortness of breath.  Generalized fatigue.  Denies any coughing.  No sick contacts he is aware of.  He is additionally had abdominal bloating and some lower to left-sided abdominal pain.  Small amount of bright red blood noted in the stool this morning.  He has a past history of diverticulitis and suspect that the symptoms may be from that.  He is also concerned about possible COVID.  He reports that somebody doing work on his home tested positive approximately 2 weeks ago.  Past Medical History:  Diagnosis Date  . AAA (abdominal aortic aneurysm) without rupture ([place]) 02/14/2014   [place] 1/19:  AAA 3 cm  . ALCOHOL ABUSE, HX OF    distant history  . Anxiety    occ panic sx, increased after death of mother  . ANXIETY DEPRESSION 05/10/2007   history of, during difficult relationship  . CAD (coronary artery disease) 08/28/2012   08/25/2012 Successful PTCA/DES x mid LAD // LAD prox 30, mid stent patent; D1 40; LCx ok; OM2 50, 50; RCA prox 20; EF 55-65  // Nuclear stress test 9/19: EF 58, small inferobasal infarct, no ischemia, Low Risk (inf defect ? diaph atten?)   . Cancer ([place]) 03/2010   tumor on larynx/tx radiation  . Carotid artery disease ([place]) 09/12/2017   [place] 6/19:  R 60-79; L 1-39; R vertebral occluded; FU 12 months  . 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 Chronic diastolic CHF (congestive heart failure) ([place]) 12/21/2017   Echo 11/17/2017 - Mild concentric LVH, EF 65-70, normal wall motion, grade 2 diastolic dysfunction, mild LAE,  normal RVSF, trivial TR  . Dilatation of aorta ([facility]) 02/14/2014  . Diverticulosis   . ESOPHAGEAL STRICTURE 05/10/2007  . GERD 05/10/2007  . Heart disease   . HIATAL HERNIA 05/10/2007  . History of echocardiogram    Echo 9/19: mild conc LVH, vigorous LVF, EF 65-70, Gr 2 DD, mild LAE, normal RVSF, trivial AI  . HYPERLIPIDEMIA 05/10/2007  . HYPERTENSION 05/10/2007  . 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 SLEEP APNEA 05/10/2007   lost 100lb-not now  . Upper airway cough syndrome    Per Dr. Melvyn Novas, pulmonary     Patient Active Problem List   Diagnosis Date Noted  . B12 deficiency 11/06/2018  . Paresthesia 08/02/2018  . Chronic diastolic CHF (congestive heart failure) ([place]) 12/21/2017  . Health care maintenance 11/07/2017  . Advance care planning 11/07/2017  . 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 Carotid artery disease ([place]) 09/12/2017  . Migraine with aura 03/31/2017  . Bloating 11/18/2016  . Left carotid bruit 05/26/2016  . Lower urinary tract symptoms (LUTS) 04/16/2016  . Sinus bradycardia 12/15/2015  . Neck pain 02/05/2015  . AAA (abdominal aortic aneurysm) without rupture ([place]) 02/14/2014  . Medicare annual wellness visit, subsequent 07/03/2013  . Skin lesion 01/08/2013  . Gout 12/21/2012  . CAD (coronary artery disease) 08/28/2012  . Hypothyroidism 08/26/2012  . Panic 03/07/2012  . 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 COUGH 02/19/2010  . Hyperlipidemia 05/10/2007  . ANXIETY DEPRESSION 05/10/2007  . 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 Essential hypertension 05/10/2007  . ESOPHAGEAL STRICTURE 05/10/2007  . GERD 05/10/2007  . 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 SLEEP APNEA 05/10/2007  . ALCOHOL ABUSE, HX OF 05/10/2007    Past Surgical History:  Procedure Laterality Date  . APPENDECTOMY    . BACK SURGERY  1997   lower back x3  . CARPOMETACARPAL ([facility]) FUSION OF THUMB Left 04/02/2014   Procedure: CARPOMETACARPAL ([place])  FUSION OF THUMB/LEFT THUMB INTERPHALNGEAL JOINT FUSION;  Surgeon: Jolyn Nap, MD;  Location: [facility];  Service: Orthopedics;  Laterality: Left;  . COLONOSCOPY    . CORONARY STENT PLACEMENT     2.75 x 12 mm Promus Premier DES was deployed in the mid LAD. The stent was post-dilated with a 3.0 x 9 mm [place] balloon - Mid LAD.  Marland Kitchen LARYNGOSCOPY / BRONCHOSCOPY / ESOPHAGOSCOPY  2012   bx  . LEFT HEART CATH AND CORONARY ANGIOGRAPHY N/A 05/28/2016   Procedure: Left Heart Cath and Coronary Angiography;  Surgeon: Belva Crome, MD;  Location: [facility];  Service: Cardiovascular;  Laterality: N/A;  . LEFT HEART CATHETERIZATION WITH CORONARY ANGIOGRAM N/A 08/25/2012   Procedure: LEFT HEART CATHETERIZATION WITH CORONARY ANGIOGRAM;  Surgeon: Burnell Blanks, MD;  Location: [facility] CATH LAB;  Service: Cardiovascular;  Laterality: N/A;  . LEFT HEART CATHETERIZATION WITH CORONARY ANGIOGRAM N/A 11/23/2013   Procedure: LEFT HEART CATHETERIZATION WITH CORONARY ANGIOGRAM;  Surgeon: Jettie Booze, MD;  Location: [facility] CATH LAB;  Service: Cardiovascular;  Laterality: N/A;  . WRIST FRACTURE SURGERY  08/21/10   Right wrist x4       Family History  Problem Relation Age of Onset  . Brain cancer Father   . Dementia Father        h/o brain tumor  . Heart disease Mother   . Hypertension Mother   . Heart disease Brother   . Hypertension Brother   . Hyperlipidemia Brother   . Colon polyps Brother   . Colon cancer Neg Hx   . Prostate cancer Neg Hx   . Esophageal cancer Neg Hx   . Pancreatic cancer Neg Hx   . Liver disease Neg Hx     Social History   Tobacco Use  . Smoking status: Former Smoker    Quit date: 09/30/2010    Years since quitting: 8.4  . Smokeless tobacco: Former Network engineer Use Topics  . Alcohol use: Not Currently  . Drug use: No    Home Medications Prior to Admission medications   Medication Sig Start Date End Date Taking? Authorizing Provider    clopidogrel (PLAVIX) 75 MG tablet TAKE 1 TABLET DAILY WITH BREAKFAST 02/28/19   Nahser, Wonda Cheng, MD  cyanocobalamin (,VITAMIN B-12,) 1000 MCG/ML injection Inject 1043mcg monthly. 12/18/18   Tonia Ghent, MD  Evolocumab (REPATHA SURECLICK) [id number] MG/ML SOAJ Inject 1 pen into the skin every 14 (fourteen) days. 11/23/18   Nahser, Wonda Cheng, MD  fenofibrate 160 MG tablet Take 1 tablet (160 mg total) by mouth daily. 01/01/19   Nahser, Wonda Cheng, MD  gabapentin (NEURONTIN) 100 MG capsule Take 100 mg by mouth as needed (for headache).    [provider]  gabapentin (NEURONTIN) 600 MG tablet Take 1 tablet (600 mg total) by mouth at bedtime. 04/10/18   Tonia Ghent, MD  levothyroxine (SYNTHROID) 100 MCG tablet  TAKE 1 TABLET DAILY BEFORE BREAKFAST 10/04/18   Tonia Ghent, MD  Melatonin 10 MG TABS Take 10 mg by mouth at bedtime.    [provider]  metoprolol tartrate (LOPRESSOR) 25 MG tablet TAKE ONE-HALF (1/2) TABLET TWICE A DAY 03/05/19   Weaver, Scott T, PA-C  nitroGLYCERIN (NITROSTAT) 0.4 MG SL tablet Place 1 tablet (0.4 mg total) under the tongue every 5 (five) minutes as needed for chest pain (do not exceed 3 pills during one episode). 09/13/17   Richardson Dopp T, PA-C  pantoprazole (PROTONIX) 40 MG tablet TAKE 1 TABLET DAILY Patient not taking: Reported on 11/23/2018 04/07/18   Tonia Ghent, MD  rosuvastatin (CRESTOR) 20 MG tablet TAKE 1 TABLET DAILY 12/25/18   Tonia Ghent, MD  SYRINGE-NEEDLE, DISP, 3 ML 25G X 1" 3 ML MISC Patient is to use to self inject 1 ml of vitamin B12 per provider instructions. 02/12/19   Tonia Ghent, MD  tamsulosin (FLOMAX) 0.4 MG CAPS capsule TAKE 1 CAPSULE DAILY 11/04/18   Tonia Ghent, MD  tiZANidine (ZANAFLEX) 4 MG tablet Take 1 tablet (4 mg total) by mouth every 6 (six) hours as needed for muscle spasms. 02/13/19   Tonia Ghent, MD    Allergies    Imdur [isosorbide dinitrate], Iohexol, Lisinopril, Pravastatin, Codeine, Iodine-131,  Simvastatin, and Statins  Review of Systems   Review of Systems All systems reviewed and negative, other than as noted in HPI.  Physical Exam Updated Vital Signs BP 136/79 (BP Location: Left Arm)   Pulse 63   Temp 97.8 F (36.6 C) (Oral)   Resp 18   Ht 5\' 7"  (1.702 m)   Wt 88.5 kg   SpO2 98%   BMI 30.54 kg/m   Physical Exam Vitals and nursing note reviewed.  Constitutional:      General: He is not in acute distress.    Appearance: He is well-developed.  HENT:     Head: Normocephalic and atraumatic.  Eyes:     General:        Right eye: No discharge.        Left eye: No discharge.     Conjunctiva/sclera: Conjunctivae normal.  Cardiovascular:     Rate and Rhythm: Normal rate and regular rhythm.     Heart sounds: Normal heart sounds. No murmur. No friction rub. No gallop.   Pulmonary:     Effort: Pulmonary effort is normal. No respiratory distress.     Breath sounds: Normal breath sounds.  Abdominal:     General: There is no distension.     Palpations: Abdomen is soft.     Tenderness: There is abdominal tenderness.     Comments: Soft.  Possibly some mild distention.  Mild left upper and left lower quadrant abdominal tenderness without rebound or guarding.  Musculoskeletal:        General: No tenderness.     Cervical back: Neck supple.  Skin:    General: Skin is warm and dry.  Neurological:     Mental Status: He is alert.  Psychiatric:        Behavior: Behavior normal.        Thought Content: Thought content normal.     ED Results / Procedures / Treatments   Labs (all labs ordered are listed, but only abnormal results are displayed) Labs Reviewed  COMPREHENSIVE METABOLIC PANEL - Abnormal; Notable for the following components:      Result Value   Creatinine, Ser 1.75 (*)  Alkaline Phosphatase 36 (*)    GFR calc non Af Amer 40 (*)    GFR calc Af Amer 47 (*)    All other components within normal limits  URINALYSIS, ROUTINE W REFLEX MICROSCOPIC - Abnormal;  Notable for the following components:   Color, Urine STRAW (*)    Specific Gravity, Urine 1.004 (*)    Protein, ur 30 (*)    All other components within normal limits  RESPIRATORY PANEL BY RT PCR (FLU A&B, COVID)  LIPASE, BLOOD  CBC    EKG None  Radiology DG Chest Portable 1 View  Result Date: 03/08/2019 CLINICAL DATA:  Shortness of breath EXAM: PORTABLE CHEST 1 VIEW COMPARISON:  Chest radiograph 08/23/2012 FINDINGS: Heart size at the upper limits of normal. Minimal ill-defined opacity at the left lung base may reflect atelectasis. The right lung is clear. No sizable pleural effusion or evidence of pneumothorax. No acute bony abnormality. Chronic left-sided rib fracture deformities. IMPRESSION: Minimal ill-defined opacity at the left lung base has an appearance most suggestive of atelectasis. Pneumonia is difficult to definitively exclude. Right lung clear. Electronically Signed   By: Kellie Simmering DO   On: 03/08/2019 08:32    Procedures Procedures (including critical care time)  Medications Ordered in ED Medications  ciprofloxacin (CIPRO) tablet 500 mg (500 mg Oral Given 03/08/19 0949)  metroNIDAZOLE (FLAGYL) tablet 500 mg (500 mg Oral Given 03/08/19 0949)  oxyCODONE-acetaminophen (PERCOCET/ROXICET) 5-325 MG per tablet 1 tablet (1 tablet Oral Given 03/08/19 [id number])    ED Course  I have reviewed the triage vital signs and the nursing notes.  Pertinent labs & imaging results that were available during my care of the patient were reviewed by me and considered in my medical decision making (see chart for details).    MDM Rules/Calculators/A&P                     64 year old male with several complaints.  I suspect that he has diverticulitis.  Past history of the same.  Abdominal symptoms seem consistent with this.  He does have some mild to moderate tenderness in his left abdomen.  No peritoneal signs.  Potentially explain the blood in the stool as well.  We will treat presumptively as such  with ciprofloxacin and metronidazole.  I have a low suspicion for serious alternative intra-abdominal process and I do not feel that imaging is needed at this time.  This could potentially explain many of his other symptoms such as generalized weakness/fatigue.  I would not expect this to cause him to feel short of breath though.  Recent COVID contact.  Will test. CXR w/vague opacity L base. Could simply be atelectasis. Afebrile. No increased WOB. O2 sats are normal on RA.   Final Clinical Impression(s) / ED Diagnoses Final diagnoses:  Diverticulitis    Rx / DC Orders ED Discharge Orders    None       Virgel Manifold, MD 03/14/19 [phone]

## 2019-03-08 NOTE — Discharge Instructions (Addendum)
I suspect you have diverticulitis. Take antibiotics until finished. Your COVID test is still pending. It should be resulted by tomorrow. Quarantine until this this resulted.

## 2019-03-08 NOTE — ED Triage Notes (Addendum)
To ED for eval of sob and body aches for past 2 days. States he feels like his diverticulitis may be 'acting up' too - pt states he passed blood in stool this am and is feeling bloated. No vomiting. Denies fevers at home. Denies difficulty with urination. Cardiac pt. Denies cp. Appears in nad. Instructed by pcp to come to ED

## 2019-03-11 ENCOUNTER — Telehealth: Payer: Self-pay | Admitting: Family Medicine

## 2019-03-11 NOTE — Telephone Encounter (Signed)
Please get update on patient after emergency room visit.  Thanks. 

## 2019-03-12 NOTE — Telephone Encounter (Signed)
Noted. Thanks.

## 2019-03-12 NOTE — Telephone Encounter (Signed)
Patient states he is doing a lot better and no symptoms of concern. Patient is taking medications prescribed. Patient does not have any questions or concerns at this time

## 2019-03-13 DIAGNOSIS — M791 Myalgia, unspecified site: Secondary | ICD-10-CM | POA: Diagnosis not present

## 2019-03-13 DIAGNOSIS — R519 Headache, unspecified: Secondary | ICD-10-CM | POA: Diagnosis not present

## 2019-03-13 DIAGNOSIS — G518 Other disorders of facial nerve: Secondary | ICD-10-CM | POA: Diagnosis not present

## 2019-03-13 DIAGNOSIS — M542 Cervicalgia: Secondary | ICD-10-CM | POA: Diagnosis not present

## 2019-04-06 ENCOUNTER — Ambulatory Visit: Payer: Medicare Other | Attending: Internal Medicine

## 2019-04-06 ENCOUNTER — Ambulatory Visit: Payer: Medicare Other

## 2019-04-06 DIAGNOSIS — Z23 Encounter for immunization: Secondary | ICD-10-CM | POA: Insufficient documentation

## 2019-04-06 NOTE — Progress Notes (Signed)
   Covid-19 Vaccination Clinic  Name:  Tony Hunt    MRN: [id number] DOB: 15-Feb-1956  04/06/2019  [name] was observed post Covid-19 immunization for 15 minutes without incidence. He was provided with Vaccine Information Sheet and instruction to access the V-Safe system.   [name] was instructed to call 911 with any severe reactions post vaccine: Marland Kitchen Difficulty breathing  . Swelling of your face and throat  . A fast heartbeat  . A bad rash all over your body  . Dizziness and weakness    Immunizations Administered    Name Date Dose VIS Date Route   Pfizer COVID-19 Vaccine 04/06/2019  6:34 PM 0.3 mL 02/09/2019 Intramuscular   Manufacturer: [place]   Lot: [id number]   [place]: [id number]

## 2019-04-07 ENCOUNTER — Ambulatory Visit: Payer: Medicare Other

## 2019-04-10 ENCOUNTER — Other Ambulatory Visit: Payer: Self-pay

## 2019-04-10 ENCOUNTER — Ambulatory Visit (INDEPENDENT_AMBULATORY_CARE_PROVIDER_SITE_OTHER): Payer: Medicare Other | Admitting: Family Medicine

## 2019-04-10 ENCOUNTER — Ambulatory Visit (INDEPENDENT_AMBULATORY_CARE_PROVIDER_SITE_OTHER)
Admission: RE | Admit: 2019-04-10 | Discharge: 2019-04-10 | Disposition: A | Discharge status: Home / Self Care | Payer: Medicare Other | Source: Ambulatory Visit | Attending: Family Medicine | Admitting: Family Medicine

## 2019-04-10 ENCOUNTER — Encounter: Payer: Self-pay | Admitting: Family Medicine

## 2019-04-10 VITALS — BP 106/66 | HR 58 | Temp 96.6°F | Ht 67.0 in | Wt 202.4 lb

## 2019-04-10 DIAGNOSIS — R0600 Dyspnea, unspecified: Secondary | ICD-10-CM

## 2019-04-10 DIAGNOSIS — R21 Rash and other nonspecific skin eruption: Secondary | ICD-10-CM | POA: Diagnosis not present

## 2019-04-10 DIAGNOSIS — R1032 Left lower quadrant pain: Secondary | ICD-10-CM | POA: Diagnosis not present

## 2019-04-10 LAB — CBC WITH DIFFERENTIAL/PLATELET
Basophils Absolute: 0.1 10*3/uL (ref 0.0–0.1)
Basophils Relative: 1.4 % (ref 0.0–3.0)
Eosinophils Absolute: 0.2 10*3/uL (ref 0.0–0.7)
Eosinophils Relative: 3.5 % (ref 0.0–5.0)
HCT: 43.2 % (ref 39.0–52.0)
Hemoglobin: 14.6 g/dL (ref 13.0–17.0)
Lymphocytes Relative: 22.8 % (ref 12.0–46.0)
Lymphs Abs: 1.2 10*3/uL (ref 0.7–4.0)
MCHC: 33.7 g/dL (ref 30.0–36.0)
MCV: 92.3 fl (ref 78.0–100.0)
Monocytes Absolute: 0.6 10*3/uL (ref 0.1–1.0)
Monocytes Relative: 11.2 % (ref 3.0–12.0)
Neutro Abs: 3.3 10*3/uL (ref 1.4–7.7)
Neutrophils Relative %: 61.1 % (ref 43.0–77.0)
Platelets: 330 10*3/uL (ref 150.0–400.0)
RBC: 4.68 Mil/uL (ref 4.22–5.81)
RDW: 13.2 % (ref 11.5–15.5)
WBC: 5.5 10*3/uL (ref 4.0–10.5)

## 2019-04-10 LAB — COMPREHENSIVE METABOLIC PANEL
ALT: 13 U/L (ref 0–53)
AST: 19 U/L (ref 0–37)
Albumin: 4.1 g/dL (ref 3.5–5.2)
Alkaline Phosphatase: 39 U/L (ref 39–117)
BUN: 25 mg/dL — ABNORMAL HIGH (ref 6–23)
CO2: 28 mEq/L (ref 19–32)
Calcium: 9.5 mg/dL (ref 8.4–10.5)
Chloride: 106 mEq/L (ref 96–112)
Creatinine, Ser: 1.8 mg/dL — ABNORMAL HIGH (ref 0.40–1.50)
GFR: 38.08 mL/min — ABNORMAL LOW (ref >60.00)
Glucose, Bld: 87 mg/dL (ref 70–99)
Potassium: 4.5 mEq/L (ref 3.5–5.1)
Sodium: 138 mEq/L (ref 135–145)
Total Bilirubin: 0.4 mg/dL (ref 0.2–1.2)
Total Protein: 6.9 g/dL (ref 6.0–8.3)

## 2019-04-10 LAB — BRAIN NATRIURETIC PEPTIDE: Pro B Natriuretic peptide (BNP): 43 pg/mL (ref 0.0–100.0)

## 2019-04-10 MED ORDER — CLOTRIMAZOLE-BETAMETHASONE 1-0.05 % EX CREA: 1.0000 "application " | TOPICAL_CREAM | Freq: Two times a day (BID) | CUTANEOUS | 0 refills | Status: DC

## 2019-04-10 MED ORDER — AMOXICILLIN-POT CLAVULANATE 875-125 MG PO TABS: 1.0000 | ORAL_TABLET | Freq: Two times a day (BID) | ORAL | 0 refills | Status: DC

## 2019-04-10 NOTE — Progress Notes (Signed)
This visit occurred during the SARS-CoV-2 public health emergency.  Safety protocols were in place, including screening questions prior to the visit, additional usage of staff PPE, and extensive cleaning of exam room while observing appropriate contact time as indicated for disinfecting solutions.  Sx started in 03/2019.   CT at ER: 1. No evidence for acute abnormality of the abdomen or pelvis. 2. Cholelithiasis without CT evidence for acute cholecystitis. 3. LEFT renal cysts. 4. Status post appendectomy. 5. Aortic atherosclerosis. (ICD10-I70.0) Aortic aneurysm NOS (ICD10-I71.9). 6. Abdominal aorta is stable, 3.3 centimeters. Recommend followup by ultrasound in 3 years. This recommendation follows ACR consensus guidelines: White Paper of the ACR Incidental Findings Committee II on Vascular Findings. J Am Coll Radiol 2013; 10:789-794. Aortic aneurysm NOS (ICD10-I71.9) 7. Bilateral iliac artery aneurysms.  He was treated preemptively for diverticulitis but he only remembers getting 1 rx from the pharmacy.  He took that but didn't recall much improvement.  He has felt worse in the meantime overall.  Then feeling slightly better yesterday and today.    Still with loose stools and urgency.  No fevers.  No vomiting.  Some nausea.  Some abd discomfort, better today and yesterday.  The pain was similar to prev.  No blood in stool now.   H/o diverticulitis noted.  He still feels bloated in his abd.  Unclear how much that contributes to dyspnea.  No BLE edema.   He had 1st dose of covid vaccine 04/06/19.    Irritated rash in the groin noted.  Chronic issue for patient.  Not acute.  Meds, vitals, and allergies reviewed.   ROS: Per HPI unless specifically indicated in ROS section   GEN: nad, alert and oriented HEENT: NECK: supple w/o LA CV: rrr PULM: ctab, no inc wob ABD: soft, +bs, left lower quadrant tender to palpation without rebound EXT: no edema SKIN: Tinea changes noted in the groin, looks  like a superficial fungal infection.  No ulceration.

## 2019-04-10 NOTE — Patient Instructions (Signed)
Go to the lab on the way out.   If you have mychart we'll likely use that to update you.     Start augmentin and use the cream twice a day.  Clear liquids in the meantime.  Update me if not better or if worse in the next few days.  Gradually advance your diet when feeling better.   Take care.  Glad to see you.

## 2019-04-11 NOTE — Assessment & Plan Note (Signed)
I question if he had incompletely treated diverticulitis since he only picked up one prescription from the pharmacy.  Unclear if abdominal bloating is contributing to his relative dyspnea.  At this point still okay for outpatient follow-up.  Discussed options.  Start Augmentin.  Routine cautions given to patient, especially about a clear liquid diet.  See notes on labs and imaging.  He agrees with plan.

## 2019-04-11 NOTE — Assessment & Plan Note (Signed)
Likely benign superficial fungal infection and he can use Lotrisone as needed.  He agrees.

## 2019-04-24 DIAGNOSIS — M542 Cervicalgia: Secondary | ICD-10-CM | POA: Diagnosis not present

## 2019-04-24 DIAGNOSIS — R519 Headache, unspecified: Secondary | ICD-10-CM | POA: Diagnosis not present

## 2019-04-24 DIAGNOSIS — M791 Myalgia, unspecified site: Secondary | ICD-10-CM | POA: Diagnosis not present

## 2019-04-24 DIAGNOSIS — G518 Other disorders of facial nerve: Secondary | ICD-10-CM | POA: Diagnosis not present

## 2019-05-01 ENCOUNTER — Ambulatory Visit: Payer: Medicare Other

## 2019-05-01 ENCOUNTER — Ambulatory Visit: Payer: Medicare Other | Attending: Internal Medicine

## 2019-05-01 DIAGNOSIS — Z23 Encounter for immunization: Secondary | ICD-10-CM | POA: Insufficient documentation

## 2019-05-01 NOTE — Progress Notes (Signed)
   Covid-19 Vaccination Clinic  Name:  Tony Hunt    MRN: [id number] DOB: October 07, 1955  05/01/2019  [name] was observed post Covid-19 immunization for 15 minutes without incident. He was provided with Vaccine Information Sheet and instruction to access the V-Safe system.   Mr. Monday was instructed to call 911 with any severe reactions post vaccine: Marland Kitchen Difficulty breathing  . Swelling of face and throat  . A fast heartbeat  . A bad rash all over body  . Dizziness and weakness   Immunizations Administered    Name Date Dose VIS Date Route   Pfizer COVID-19 Vaccine 05/01/2019  8:15 AM 0.3 mL 02/09/2019 Intramuscular   Manufacturer: [place]   Lot: [id number]   [place]: [id number]

## 2019-05-14 ENCOUNTER — Telehealth: Payer: Self-pay

## 2019-05-14 ENCOUNTER — Other Ambulatory Visit: Payer: Self-pay | Admitting: Family Medicine

## 2019-05-14 ENCOUNTER — Other Ambulatory Visit (INDEPENDENT_AMBULATORY_CARE_PROVIDER_SITE_OTHER): Payer: Medicare Other

## 2019-05-14 ENCOUNTER — Ambulatory Visit (HOSPITAL_COMMUNITY)
Admission: RE | Admit: 2019-05-14 | Discharge: 2019-05-14 | Disposition: A | Discharge status: Home / Self Care | Payer: Medicare Other | Source: Ambulatory Visit | Attending: Family Medicine | Admitting: Family Medicine

## 2019-05-14 ENCOUNTER — Other Ambulatory Visit: Payer: Self-pay

## 2019-05-14 DIAGNOSIS — R1032 Left lower quadrant pain: Secondary | ICD-10-CM | POA: Insufficient documentation

## 2019-05-14 DIAGNOSIS — K8020 Calculus of gallbladder without cholecystitis without obstruction: Secondary | ICD-10-CM | POA: Diagnosis not present

## 2019-05-14 DIAGNOSIS — K625 Hemorrhage of anus and rectum: Secondary | ICD-10-CM

## 2019-05-14 LAB — CBC WITH DIFFERENTIAL/PLATELET
Basophils Absolute: 0 10*3/uL (ref 0.0–0.1)
Basophils Relative: 0.9 % (ref 0.0–3.0)
Eosinophils Absolute: 0.2 10*3/uL (ref 0.0–0.7)
Eosinophils Relative: 3.3 % (ref 0.0–5.0)
HCT: 35.5 % — ABNORMAL LOW (ref 39.0–52.0)
Hemoglobin: 12.4 g/dL — ABNORMAL LOW (ref 13.0–17.0)
Lymphocytes Relative: 20.5 % (ref 12.0–46.0)
Lymphs Abs: 1.1 10*3/uL (ref 0.7–4.0)
MCHC: 35 g/dL (ref 30.0–36.0)
MCV: 91 fl (ref 78.0–100.0)
Monocytes Absolute: 0.4 10*3/uL (ref 0.1–1.0)
Monocytes Relative: 7 % (ref 3.0–12.0)
Neutro Abs: 3.8 10*3/uL (ref 1.4–7.7)
Neutrophils Relative %: 68.3 % (ref 43.0–77.0)
Platelets: 266 10*3/uL (ref 150.0–400.0)
RBC: 3.9 Mil/uL — ABNORMAL LOW (ref 4.22–5.81)
RDW: 12.9 % (ref 11.5–15.5)
WBC: 5.5 10*3/uL (ref 4.0–10.5)

## 2019-05-14 LAB — COMPREHENSIVE METABOLIC PANEL
ALT: 16 U/L (ref 0–53)
AST: 23 U/L (ref 0–37)
Albumin: 3.6 g/dL (ref 3.5–5.2)
Alkaline Phosphatase: 33 U/L — ABNORMAL LOW (ref 39–117)
BUN: 16 mg/dL (ref 6–23)
CO2: 26 mEq/L (ref 19–32)
Calcium: 9 mg/dL (ref 8.4–10.5)
Chloride: 107 mEq/L (ref 96–112)
Creatinine, Ser: 1.53 mg/dL — ABNORMAL HIGH (ref 0.40–1.50)
GFR: 45.92 mL/min — ABNORMAL LOW (ref >60.00)
Glucose, Bld: 113 mg/dL — ABNORMAL HIGH (ref 70–99)
Potassium: 3.9 mEq/L (ref 3.5–5.1)
Sodium: 139 mEq/L (ref 135–145)
Total Bilirubin: 0.4 mg/dL (ref 0.2–1.2)
Total Protein: 5.8 g/dL — ABNORMAL LOW (ref 6.0–8.3)

## 2019-05-14 MED ORDER — METRONIDAZOLE 500 MG PO TABS: 500.0000 mg | ORAL_TABLET | Freq: Three times a day (TID) | ORAL | 0 refills | Status: DC

## 2019-05-14 MED ORDER — CIPROFLOXACIN HCL 500 MG PO TABS: 500.0000 mg | ORAL_TABLET | Freq: Two times a day (BID) | ORAL | 0 refills | Status: DC

## 2019-05-14 NOTE — Telephone Encounter (Signed)
He has a h/o likely diverticulitis and I suspect this is the current issue.   If clearly worse in the meantime, then to ER.  I think it makes sense to recheck his labs in the meantime, repeat the CT, and start cipro flagyl.   I am glad to see him in the office if needed, but it may make more sense to proceed with the abx/imaging/labs.   I put in the orders in the meantime.   Please check with patient on this plan.   He'll need lab visit- please schedule.   I am routing the message to [facility] for re: CT.  Have him hold tizanidine in the meantime.

## 2019-05-14 NOTE — Telephone Encounter (Signed)
Noted. Thanks. Will await CT/labs.

## 2019-05-14 NOTE — Telephone Encounter (Signed)
Pt said having dull and sharp LLQ abd pain;after taking augmentin pain never went away and gradually has gotten worse; last night pt had horrible night. Now abd pain at 7-8 pain level. BM is regular;no mucus, no diarrhea or constipation but passing a lot of bright red blood in water of commode over weekend 05/12/19 & 05/13/19. There were more than one time overweekend pt thought was needing to have a BM but was no BM just a lot of blood.This morning when had normal BM there was no blood. Pt said entire abd is swollen with difficulty breathing which started 2 wks ago but was worse over weekend. Abdomen is swollen now but no difficulty breathing.Pt said last 2 years this has happened many times. Pt said when took 2 abx at same time helped and symptoms would be gone for several months. Dr Damita Dunnings will do review of pts chart and then make a determination; pt understands Dr Damita Dunnings is doing the best thing for his care and understands Dr Damita Dunnings is trying to keep pt out of ED. Pt will wait for cb on cell phone. [phone]. ED precautions given and pt voiced understanding.[employer] [place].

## 2019-05-14 NOTE — Telephone Encounter (Signed)
Pt notified as instructed and pt voiced understanding. Pt does not have any tizanidine left so pt will not be taking that at this time. Pt scheduled lab appt at [facility] for CMP and CBC with diff today at 12:45. Pt will continue fasting until after CT today that [facility] has scheduled for 05/14/19 at 1:45 at [facility]. Pt will pick up abxs later today. ED precautions given and pt voiced understanding. FYI to Dr Damita Dunnings and pt is appreciative.

## 2019-05-16 NOTE — Progress Notes (Signed)
05/16/2019 [name] [id number] 1955-06-18   Chief Complaint: abdominal pain, rectal bleeding   History of Present Illness:  Tony Hunt is a 64 year old male with a past medical history of anxiety, depression, coronary artery disease with DES 2014 on Plavix, diastolic CHF, sleep apnea, laryngeal cancer s/p radiation, esophageal stricture, GERD and diverticulitis. He presents today for further evaluation regarding lower abdominal pain and rectal bleeding. He developed generalized abdominal pain with associated abdominal bloat 2 weeks ago. His pain became more localized to the LLQ with mild epigastric pain. He feels SOB when his abdomen is tight and bloated. No cough or hemoptysis. He is passing a regular brown soft  formed stool 2 to 3 times daily for the past month, previously passed one BM daily. He is now having increased urgency with his BMs and a few times he soiled himself before getting to the bathroom. No watery diarrhea. No mucous per the rectum.  He reported passing a moderate amount or bright red blood which covered his stools and turned the toilet water red on Thursday last week through Sunday 3.14. No further rectal bleeding since then. His lower abdominal pain worsened so he contacted his PCP Dr. Damita Dunnings.  Labs 3/15 showed WBC 5.5. Hg 12.4 (down from 14.6 on 2/9). HCT 43.2. MCV 91. PLT 266. BUN 16. Cr. 1.53 (.40 - 1.50). An abdominal/pelvic CT scan with contrast 05/14/2019 showed a normal bowel, no small or large bowel wall thickening or significant diverticular disease, cholelithiasis without inflammation and a stable infrarenal aortic aneurysm 3.3cm. Dr. Damita Dunnings prescribed Cipro 500mg  po bid and Flagyl 500mg  po bid x 10 days. His lower abdominal pain has slightly improved, not any worse after starting the antibiotics. He has some epigastric pain, not severe. No active heartburn. No melena. He complains of feeling fatigued. He underwent a colonoscopy by Dr. Henrene Pastor in 2012 which showed  diverticulosis to the sigmoid colon and internal hemorrhoids. No family history of colon cancer.   In review of his records, he presented to [facility] ER on 03/08/2019 with SOB and lower abdominal pain with LLQ tenderness on exam by the ER physician. He was prescribed Cipro 500mg  po bid and Metronidazole 500mg  po bid x 10 days. Imaging was not done as there was  low suspicion for a serious intra-abdominal process.  He stated his lower abdominal pain improved after he took the Cipro and Flagyl but did not completely resolve.    History of CAD with one DES on Plavix. ECHO 11/17/2017:  LV EF: 65% -  70%  He last took Plavix this am. He is followed by cardiologist Dr. Acie Fredrickson every 6 months. He denies having any chest pain or palpitations. As mentioned above, he has SOB when his abdomen is bloated.   His BP is low in office today 100/62. He took his Metoprolol 12.5mg  this am.   He received Covid 19  vaccinations 04/06/2019 and 05/01/2019  CBC Latest Ref Rng & Units 05/14/2019 04/10/2019 03/08/2019  WBC 4.0 - 10.5 K/uL 5.5 5.5 6.0  Hemoglobin 13.0 - 17.0 g/dL 12.4(L) 14.6 15.2  Hematocrit 39.0 - 52.0 % 35.5(L) 43.2 44.7  Platelets 150.0 - 400.0 K/uL 266.0 330.0 302   CMP Latest Ref Rng & Units 05/14/2019 04/10/2019 03/08/2019  Glucose 70 - 99 mg/dL 113(H) 87 94  BUN 6 - 23 mg/dL 16 25(H) 22  Creatinine 0.40 - 1.50 mg/dL 1.53(H) 1.80(H) 1.75(H)  Sodium 135 - 145 mEq/L 139 138 141  Potassium 3.5 - 5.1 mEq/L 3.9 4.5 4.4  Chloride 96 - 112 mEq/L 107 106 106  CO2 19 - 32 mEq/L 26 28 25   Calcium 8.4 - 10.5 mg/dL 9.0 9.5 10.3  Total Protein 6.0 - 8.3 g/dL 5.8(L) 6.9 6.7  Total Bilirubin 0.2 - 1.2 mg/dL 0.4 0.4 0.6  Alkaline Phos 39 - 117 U/L 33(L) 39 36(L)  AST 0 - 37 U/L 23 19 25   ALT 0 - 53 U/L 16 13 17     Abdominal/pelvic CT with contrast 05/14/2019: 1. No acute abnormality in the abdomen/pelvis. No explanation for left lower quadrant pain. 2. Cholelithiasis without gallbladder  inflammation. 3. Stable infrarenal aortic aneurysm measuring 3.3 cm. 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 Recommend followup by ultrasound in 3 years  Colonoscopy 09/25/2010 by Dr. Henrene Pastor: - Mild sigmoid diverticulosis. - Internal hemorrhoids.  - Excellent prep. - 10 year recall.   Colonoscopy 07/13/2000: - Normal colonoscopy. - Interna hemorrhoids.    EGD 01/06/2017.  - Normal esophagus. - Mild Erosive gastropathy. Biopsied. - Normal stomach. - Normal examined duodenum.   Past Medical History:  Diagnosis Date  . AAA (abdominal aortic aneurysm) without rupture ([place]) 02/14/2014   [place] 1/19:  AAA 3 cm  . ALCOHOL ABUSE, HX OF    distant history  . Anxiety    occ panic sx, increased after death of mother  . ANXIETY DEPRESSION 05/10/2007   history of, during difficult relationship  . CAD (coronary artery disease) 08/28/2012   08/25/2012 Successful PTCA/DES x mid LAD // LAD prox 30, mid stent patent; D1 40; LCx ok; OM2 50, 50; RCA prox 20; EF 55-65  // Nuclear stress test 9/19: EF 58, small inferobasal infarct, no ischemia, Low Risk (inf defect ? diaph atten?)   . Cancer ([place]) 03/2010   tumor on larynx/tx radiation  . Carotid artery disease ([place]) 09/12/2017   [place] 6/19:  R 60-79; L 1-39; R vertebral occluded; FU 12 months  . 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 Chronic diastolic CHF (congestive heart failure) ([place]) 12/21/2017   Echo 11/17/2017 - Mild concentric LVH, EF 65-70, normal wall motion, grade 2 diastolic dysfunction, mild LAE, normal RVSF, trivial TR  . Dilatation of aorta ([facility]) 02/14/2014  . Diverticulosis   . ESOPHAGEAL STRICTURE 05/10/2007  . GERD 05/10/2007  . Heart disease   . HIATAL HERNIA 05/10/2007  . History of echocardiogram    Echo 9/19: mild conc LVH, vigorous LVF, EF 65-70, Gr 2 DD, mild LAE, normal RVSF, trivial AI  . HYPERLIPIDEMIA 05/10/2007  . HYPERTENSION 05/10/2007  .  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 SLEEP APNEA 05/10/2007   lost 100lb-not now  . Upper airway cough syndrome    Per Dr. Melvyn Novas, pulmonary     Current Outpatient Medications on File Prior to Visit  Medication Sig Dispense Refill  . ciprofloxacin (CIPRO) 500 MG tablet Take 1 tablet (500 mg total) by mouth 2 (two) times daily. 20 tablet 0  . clopidogrel (PLAVIX) 75 MG tablet TAKE 1 TABLET DAILY WITH BREAKFAST 90 tablet 3  . clotrimazole-betamethasone (LOTRISONE) cream Apply 1 application topically 2 (two) times daily. 30 g 0  . cyanocobalamin (,VITAMIN B-12,) 1000 MCG/ML injection Inject 1073mcg monthly. 10 mL 1  . Evolocumab (REPATHA SURECLICK) [id number] MG/ML SOAJ Inject 1 pen into the skin every 14 (fourteen) days. 6 pen 3  . fenofibrate 160 MG tablet Take 1 tablet (160 mg total) by  mouth daily. 90 tablet 3  . gabapentin (NEURONTIN) 100 MG capsule Take 100 mg by mouth as needed (for headache).    . gabapentin (NEURONTIN) 600 MG tablet Take 1 tablet (600 mg total) by mouth at bedtime.    Marland Kitchen levothyroxine (SYNTHROID) 100 MCG tablet TAKE 1 TABLET DAILY BEFORE BREAKFAST 90 tablet 2  . Melatonin 10 MG TABS Take 10 mg by mouth at bedtime.    . metoprolol tartrate (LOPRESSOR) 25 MG tablet TAKE ONE-HALF (1/2) TABLET TWICE A DAY 90 tablet 3  . metroNIDAZOLE (FLAGYL) 500 MG tablet Take 1 tablet (500 mg total) by mouth 3 (three) times daily. 30 tablet 0  . nitroGLYCERIN (NITROSTAT) 0.4 MG SL tablet Place 1 tablet (0.4 mg total) under the tongue every 5 (five) minutes as needed for chest pain (do not exceed 3 pills during one episode). 25 tablet 6  . pantoprazole (PROTONIX) 40 MG tablet TAKE 1 TABLET DAILY 90 tablet 4  . rosuvastatin (CRESTOR) 20 MG tablet TAKE 1 TABLET DAILY 90 tablet 3  . SYRINGE-NEEDLE, DISP, 3 ML 25G X 1" 3 ML MISC Patient is to use to self inject 1 ml of vitamin B12 per provider instructions. 10 each 1  . tamsulosin (FLOMAX) 0.4 MG CAPS capsule TAKE 1 CAPSULE DAILY 90 capsule 3    No current facility-administered medications on file prior to visit.   Allergies  Allergen Reactions  . Imdur [Isosorbide Dinitrate] Other (See Comments)    Headache at 60mg  dose, but able to tolerate 30mg  per day  . Iohexol Anaphylaxis and Other (See Comments)    OK with 13 hour prep  . Lisinopril Other (See Comments)    Elevated cr- improved off med  . Pravastatin Nausea Only and Other (See Comments)    Intense headache  . Codeine Itching  . Iodine-131 Other (See Comments)    Passed out  . Simvastatin Other (See Comments)    Edema  . Statins Other (See Comments)    Sig edema on simvastatin   Current Medications, Allergies, Past Medical History, Past Surgical History, Family History and Social History were reviewed in [employer] record.   Physical Exam: BP 100/62   Pulse (!) 58   Ht 5' 6.5" (1.689 m)   Wt 207 lb (93.9 kg)   BMI 32.91 kg/m  General: Fatigued appearing 63 year old male in no acute distress. Head: Normocephalic and atraumatic. Eyes:  No scleral icterus. Conjunctiva pink . Ears: Normal auditory acuity. Lungs: Clear throughout to auscultation. Heart: Regular rate and rhythm, no murmur. Abdomen: Soft, mildly distended. Generalized tenderness throughout the abdomen except LUQ nontender, no rebound or guarding.  No masses or hepatomegaly. Normal bowel sounds x 4 quadrants. No bruit.  Rectal: Anal hemorrhoids mildly inflamed without active bleeding. No mass. No blood or melena in the rectum. CMA Olivia Mackie present during exam.  Musculoskeletal: Symmetrical with no gross deformities. Extremities: No edema. Neurological: Alert oriented x 4. No focal deficits.  Psychological:  Alert and cooperative. Normal mood and affect  Assessment and Recommendations:  1. Lower abdominal pain with hematochezia most likely ischemic colitis. No evidence of diverticulitis on CTAP 05/14/2019.  -Repeat CBC  -Continue Cipro and Flagyl -Florastor probiotic 1 po  bid x 2 to 4 weeks  -Diagnostic colonoscopy with Dr. Henrene Pastor in a few weeks. Colonoscopy benefits and risks discussed including risk with sedation, risk of bleeding, perforation and infection  -Patient to follow up with Dr. Damita Dunnings regarding low BP 100/62. -Our office will  contact cardiologist Dr. Acie Fredrickson to verify Plavix instructions prior to colonoscopy and EGD -Patient to call our office if his rectal bleeding recurs or if his abdominal pain worsens -To the ED if he develops CP, SOB or severe abdominal pain -Further recommendations to be determined after repeat CBC results received.  -Dr. Henrene Pastor was consulted and he examined the patient, agreed with the above plan   2. Normocytic anemia  -CBC, iron panel and Vitamin B12 level now  3. Epigastric pain, early satiety.  -EGD at time of colonoscopy -Continue Pantoprazole 40mg  daily  4. Gallstones. Normal LFTs.  -Monitor upper abdominal pain.   5. CAD with DES on Plavix   6. Infrarenal aortic aneurysm measuring 3.3 cm, stable         2. Normocytic anemia   3. CAD

## 2019-05-17 ENCOUNTER — Other Ambulatory Visit (INDEPENDENT_AMBULATORY_CARE_PROVIDER_SITE_OTHER): Payer: Medicare Other

## 2019-05-17 ENCOUNTER — Telehealth: Payer: Self-pay | Admitting: General Surgery

## 2019-05-17 ENCOUNTER — Encounter: Payer: Self-pay | Admitting: Nurse Practitioner

## 2019-05-17 ENCOUNTER — Ambulatory Visit (INDEPENDENT_AMBULATORY_CARE_PROVIDER_SITE_OTHER): Payer: Medicare Other | Admitting: Nurse Practitioner

## 2019-05-17 VITALS — BP 100/62 | HR 58 | Ht 66.5 in | Wt 207.0 lb

## 2019-05-17 DIAGNOSIS — R1084 Generalized abdominal pain: Secondary | ICD-10-CM

## 2019-05-17 DIAGNOSIS — R140 Abdominal distension (gaseous): Secondary | ICD-10-CM

## 2019-05-17 DIAGNOSIS — K921 Melena: Secondary | ICD-10-CM

## 2019-05-17 DIAGNOSIS — R1013 Epigastric pain: Secondary | ICD-10-CM | POA: Diagnosis not present

## 2019-05-17 LAB — BASIC METABOLIC PANEL
BUN: 17 mg/dL (ref 6–23)
CO2: 29 mEq/L (ref 19–32)
Calcium: 9.5 mg/dL (ref 8.4–10.5)
Chloride: 105 mEq/L (ref 96–112)
Creatinine, Ser: 1.68 mg/dL — ABNORMAL HIGH (ref 0.40–1.50)
GFR: 41.22 mL/min — ABNORMAL LOW (ref >60.00)
Glucose, Bld: 82 mg/dL (ref 70–99)
Potassium: 4.3 mEq/L (ref 3.5–5.1)
Sodium: 139 mEq/L (ref 135–145)

## 2019-05-17 LAB — IBC PANEL
Iron: 78 ug/dL (ref 42–165)
Saturation Ratios: 17.4 % — ABNORMAL LOW (ref 20.0–50.0)
Transferrin: 320 mg/dL (ref 212.0–360.0)

## 2019-05-17 LAB — FERRITIN: Ferritin: 34.9 ng/mL (ref 22.0–322.0)

## 2019-05-17 LAB — VITAMIN B12: Vitamin B-12: 568 pg/mL (ref 211–911)

## 2019-05-17 LAB — FOLATE: Folate: 7.4 ng/mL (ref >5.9)

## 2019-05-17 MED ORDER — NA SULFATE-K SULFATE-MG SULF 17.5-3.13-1.6 GM/177ML PO SOLN: 1.0000 | Freq: Once | ORAL | 0 refills | Status: AC

## 2019-05-17 NOTE — Telephone Encounter (Signed)
[facility] Pre-operative Risk Assessment     Request for surgical clearance:     Endoscopy Procedure  What type of surgery is being performed?     Endoscopy/colonoscopy  When is this surgery scheduled?     06/22/2019  What type of clearance is required ?   Pharmacy  Are there any medications that need to be held prior to surgery and how long? Plavix for 5 days  Practice name and name of physician performing surgery?      [facility]  What is your office phone and fax number?      Phone- [phone]  [phone]  Anesthesia type (None, local, MAC, general) ?       MAC

## 2019-05-17 NOTE — Telephone Encounter (Signed)
Pt is at low risk.  He may hold the Plavix for 5 days prior to colonoscopy / endoscopy .

## 2019-05-17 NOTE — Patient Instructions (Signed)
If you are age 65 or older, your body mass index should be between 23-30. Your Body mass index is 32.91 kg/m. If this is out of the aforementioned range listed, please consider follow up with your Primary Care Provider.  If you are age 11 or younger, your body mass index should be between 19-25. Your Body mass index is 32.91 kg/m. If this is out of the aformentioned range listed, please consider follow up with your Primary Care Provider.   Your provider has requested that you go to the basement level for lab work before leaving today. Press "B" on the elevator. The lab is located at the first door on the left as you exit the elevator.  We have sent the following medications to your pharmacy for you to pick up at your convenience:  suprep  Continue with Cipro and Flagyl for possible colitis. Go to the emergency room of you develop: Chest pain, shortness of breath, worsening abdominal pain, and rectal bleeding.  Take Florastor probiotic 1 by mouth 2 times daily for 4 weeks. Take Hardin Negus probiotic 1 daily  Due to recent changes in healthcare laws, you may see the results of your imaging and laboratory studies on MyChart before your provider has had a chance to review them.  We understand that in some cases there may be results that are confusing or concerning to you. Not all laboratory results come back in the same time frame and the provider may be waiting for multiple results in order to interpret others.  Please give [place] 48 hours in order for your provider to thoroughly review all the results before contacting the office for clarification of your results. '

## 2019-05-17 NOTE — Progress Notes (Signed)
I saw the patient along with the nurse practitioner today.  I interviewed him and examined him.  Agree with the comprehensive note as outlined

## 2019-05-17 NOTE — Telephone Encounter (Signed)
   Primary Cardiologist: Mertie Moores, MD  Chart reviewed as part of pre-operative protocol coverage.  Hx of coronary artery diseases/p drug eluting stent to the LAD in2014,hypertension,hyperlipidemia, hypothyroidism,abdominal aortic aneurysm, carotid artery disease.Cardiac Catheterizationin 04/2016 demonstrated patent LAD stent. Low risk stress test in 2019. He was doing well when seen by you 10/2018>> discontinued ASA and continued Plavix.   Dr. Acie Fredrickson, please give your recommendations regarding holding Plavix. Please forward your response to P [facility] PREOP.   Thank you  Leanor Kail, PA 05/17/2019, 1:17 PM

## 2019-05-18 LAB — IRON, TOTAL/TOTAL IRON BINDING CAP
%SAT: 20 % (calc) (ref 20–48)
Iron: 82 ug/dL (ref 50–180)
TIBC: 418 mcg/dL (calc) (ref 250–425)

## 2019-05-21 ENCOUNTER — Other Ambulatory Visit: Payer: Self-pay | Admitting: Family Medicine

## 2019-05-21 NOTE — Telephone Encounter (Signed)
Contacted the patient and told him Dr Acie Fredrickson stated to stop his plavix 5 days prior to his procedure. Patient verbalized understanding and repeated back what I told him.

## 2019-05-22 ENCOUNTER — Other Ambulatory Visit: Payer: Self-pay | Admitting: General Surgery

## 2019-05-22 ENCOUNTER — Other Ambulatory Visit (INDEPENDENT_AMBULATORY_CARE_PROVIDER_SITE_OTHER): Payer: Medicare Other

## 2019-05-22 DIAGNOSIS — K921 Melena: Secondary | ICD-10-CM

## 2019-05-22 LAB — CBC WITH DIFFERENTIAL/PLATELET
Basophils Absolute: 0.1 10*3/uL (ref 0.0–0.1)
Basophils Relative: 1.1 % (ref 0.0–3.0)
Eosinophils Absolute: 0.2 10*3/uL (ref 0.0–0.7)
Eosinophils Relative: 2.8 % (ref 0.0–5.0)
HCT: 38.1 % — ABNORMAL LOW (ref 39.0–52.0)
Hemoglobin: 13.1 g/dL (ref 13.0–17.0)
Lymphocytes Relative: 27.4 % (ref 12.0–46.0)
Lymphs Abs: 1.4 10*3/uL (ref 0.7–4.0)
MCHC: 34.4 g/dL (ref 30.0–36.0)
MCV: 91.3 fl (ref 78.0–100.0)
Monocytes Absolute: 0.6 10*3/uL (ref 0.1–1.0)
Monocytes Relative: 12.3 % — ABNORMAL HIGH (ref 3.0–12.0)
Neutro Abs: 3 10*3/uL (ref 1.4–7.7)
Neutrophils Relative %: 56.4 % (ref 43.0–77.0)
Platelets: 309 10*3/uL (ref 150.0–400.0)
RBC: 4.17 Mil/uL — ABNORMAL LOW (ref 4.22–5.81)
RDW: 13.4 % (ref 11.5–15.5)
WBC: 5.3 10*3/uL (ref 4.0–10.5)

## 2019-06-05 DIAGNOSIS — M791 Myalgia, unspecified site: Secondary | ICD-10-CM | POA: Diagnosis not present

## 2019-06-05 DIAGNOSIS — R519 Headache, unspecified: Secondary | ICD-10-CM | POA: Diagnosis not present

## 2019-06-05 DIAGNOSIS — G518 Other disorders of facial nerve: Secondary | ICD-10-CM | POA: Diagnosis not present

## 2019-06-05 DIAGNOSIS — M542 Cervicalgia: Secondary | ICD-10-CM | POA: Diagnosis not present

## 2019-06-22 ENCOUNTER — Ambulatory Visit (AMBULATORY_SURGERY_CENTER): Payer: Medicare Other | Admitting: Internal Medicine

## 2019-06-22 ENCOUNTER — Encounter: Payer: Self-pay | Admitting: Internal Medicine

## 2019-06-22 ENCOUNTER — Other Ambulatory Visit: Payer: Self-pay

## 2019-06-22 VITALS — BP 129/79 | HR 59 | Temp 97.7°F | Resp 20 | Ht 66.0 in | Wt 207.0 lb

## 2019-06-22 DIAGNOSIS — K921 Melena: Secondary | ICD-10-CM | POA: Diagnosis not present

## 2019-06-22 DIAGNOSIS — R1013 Epigastric pain: Secondary | ICD-10-CM

## 2019-06-22 DIAGNOSIS — Z86010 Personal history of colonic polyps: Secondary | ICD-10-CM | POA: Diagnosis not present

## 2019-06-22 DIAGNOSIS — K5732 Diverticulitis of large intestine without perforation or abscess without bleeding: Secondary | ICD-10-CM

## 2019-06-22 DIAGNOSIS — K648 Other hemorrhoids: Secondary | ICD-10-CM | POA: Diagnosis not present

## 2019-06-22 MED ORDER — SODIUM CHLORIDE 0.9 % IV SOLN: 500.0000 mL | Freq: Once | INTRAVENOUS | Status: DC

## 2019-06-22 NOTE — Op Note (Signed)
[facility] Patient Name: Tony Hunt Procedure Date: 06/22/2019 1:49 PM MRN: [id number] Endoscopist: Docia Chuck. Henrene Pastor , MD Age: 64 Referring MD:  Date of Birth: 26-Sep-1955 Gender: Male Account #: [id number] Procedure:                Upper GI endoscopy Indications:              Epigastric abdominal pain Medicines:                Monitored Anesthesia Care Procedure:                Pre-Anesthesia Assessment:                           - Prior to the procedure, a History and Physical                            was performed, and patient medications and                            allergies were reviewed. The patient's tolerance of                            previous anesthesia was also reviewed. The risks                            and benefits of the procedure and the sedation                            options and risks were discussed with the patient.                            All questions were answered, and informed consent                            was obtained. Prior Anticoagulants: The patient has                            taken Plavix (clopidogrel), last dose was 6 days                            prior to procedure. ASA Grade Assessment: III - A                            patient with severe systemic disease. After                            reviewing the risks and benefits, the patient was                            deemed in satisfactory condition to undergo the                            procedure.  After obtaining informed consent, the endoscope was                            passed under direct vision. Throughout the                            procedure, the patient's blood pressure, pulse, and                            oxygen saturations were monitored continuously. The                            Endoscope was introduced through the mouth, and                            advanced to the second part of duodenum. The upper           GI endoscopy was accomplished without difficulty.                            The patient tolerated the procedure well. Scope In: Scope Out: Findings:                 The esophagus was normal.                           The stomach was normal.                           The examined duodenum was normal.                           The cardia and gastric fundus were normal on                            retroflexion. Complications:            No immediate complications. Estimated Blood Loss:     Estimated blood loss: none. Impression:               - Normal esophagus.                           - Normal stomach.                           - Normal examined duodenum.                           - No specimens collected. Recommendation:           - Patient has a contact number available for                            emergencies. The signs and symptoms of potential                            delayed complications were discussed with the  patient. Return to normal activities tomorrow.                            Written discharge instructions were provided to the                            patient.                           - Resume previous diet.                           - Continue present medications.                           - Resume Plavix (clopidogrel) at prior dose today. Docia Chuck. Henrene Pastor, MD 06/22/2019 2:34:13 PM This report has been signed electronically.

## 2019-06-22 NOTE — Progress Notes (Signed)
pt tolerated well. VSS. awake and to recovery. Report given to RN. Oral bite block inserted and removed with ease. Atraumatic. 

## 2019-06-22 NOTE — Patient Instructions (Signed)
Information on diverticulosis given to you today.  Resume Plavix at prior dose today.  YOU HAD AN ENDOSCOPIC PROCEDURE TODAY AT [facility]:   Refer to the procedure report that was given to you for any specific questions about what was found during the examination.  If the procedure report does not answer your questions, please call your gastroenterologist to clarify.  If you requested that your care partner not be given the details of your procedure findings, then the procedure report has been included in a sealed envelope for you to review at your convenience later.  YOU SHOULD EXPECT: Some feelings of bloating in the abdomen. Passage of more gas than usual.  Walking can help get rid of the air that was put into your GI tract during the procedure and reduce the bloating. If you had a lower endoscopy (such as a colonoscopy or flexible sigmoidoscopy) you may notice spotting of blood in your stool or on the toilet paper. If you underwent a bowel prep for your procedure, you may not have a normal bowel movement for a few days.  Please Note:  You might notice some irritation and congestion in your nose or some drainage.  This is from the oxygen used during your procedure.  There is no need for concern and it should clear up in a day or so.  SYMPTOMS TO REPORT IMMEDIATELY:   Following lower endoscopy (colonoscopy or flexible sigmoidoscopy):  Excessive amounts of blood in the stool  Significant tenderness or worsening of abdominal pains  Swelling of the abdomen that is new, acute  Fever of 100F or higher   Following upper endoscopy (EGD)  Vomiting of blood or coffee ground material  New chest pain or pain under the shoulder blades  Painful or persistently difficult swallowing  New shortness of breath  Fever of 100F or higher  Black, tarry-looking stools  For urgent or emergent issues, a gastroenterologist can be reached at any hour by calling [phone]. Do not use  MyChart messaging for urgent concerns.    DIET:  We do recommend a small meal at first, but then you may proceed to your regular diet.  Drink plenty of fluids but you should avoid alcoholic beverages for 24 hours.  ACTIVITY:  You should plan to take it easy for the rest of today and you should NOT DRIVE or use heavy machinery until tomorrow (because of the sedation medicines used during the test).    FOLLOW UP: Our staff will call the number listed on your records 48-72 hours following your procedure to check on you and address any questions or concerns that you may have regarding the information given to you following your procedure. If we do not reach you, we will leave a message.  We will attempt to reach you two times.  During this call, we will ask if you have developed any symptoms of COVID 19. If you develop any symptoms (ie: fever, flu-like symptoms, shortness of breath, cough etc.) before then, please call [phone].  If you test positive for Covid 19 in the 2 weeks post procedure, please call and report this information to [place].    If any biopsies were taken you will be contacted by phone or by letter within the next 1-3 weeks.  Please call us at [phone] if you have not heard about the biopsies in 3 weeks.    SIGNATURES/CONFIDENTIALITY: You and/or your care partner have signed paperwork which will be entered into your electronic  medical record.  These signatures attest to the fact that that the information above on your After Visit Summary has been reviewed and is understood.  Full responsibility of the confidentiality of this discharge information lies with you and/or your care-partner.

## 2019-06-22 NOTE — Op Note (Signed)
[place] Patient Name: Tony Hunt Procedure Date: 06/22/2019 1:51 PM MRN: [id number] Endoscopist: Docia Chuck. Henrene Pastor , MD Age: 64 Referring MD:  Date of Birth: 1955/06/13 Gender: Male Account #: [id number] Procedure:                Colonoscopy Indications:              Abdominal pain, Rectal bleeding. Previous                            examinations 2002, 2012 both negative for neoplasia. Medicines:                Monitored Anesthesia Care Procedure:                Pre-Anesthesia Assessment:                           - Prior to the procedure, a History and Physical                            was performed, and patient medications and                            allergies were reviewed. The patient's tolerance of                            previous anesthesia was also reviewed. The risks                            and benefits of the procedure and the sedation                            options and risks were discussed with the patient.                            All questions were answered, and informed consent                            was obtained. Prior Anticoagulants: The patient has                            taken no previous anticoagulant or antiplatelet                            agents. ASA Grade Assessment: II - A patient with                            mild systemic disease. After reviewing the risks                            and benefits, the patient was deemed in                            satisfactory condition to undergo the procedure.  After obtaining informed consent, the colonoscope                            was passed under direct vision. Throughout the                            procedure, the patient's blood pressure, pulse, and                            oxygen saturations were monitored continuously. The                            Colonoscope was introduced through the anus and                            advanced to the the  cecum, identified by                            appendiceal orifice and ileocecal valve. The                            ileocecal valve, appendiceal orifice, and rectum                            were photographed. The quality of the bowel                            preparation was excellent. The colonoscopy was                            performed without difficulty. The patient tolerated                            the procedure well. The bowel preparation used was                            SUPREP via split dose instruction. Scope In: 2:07:49 PM Scope Out: 2:17:16 PM Scope Withdrawal Time: 0 hours 8 minutes 6 seconds  Total Procedure Duration: 0 hours 9 minutes 27 seconds  Findings:                 A few diverticula were found in the sigmoid colon.                           The exam was otherwise without abnormality on                            direct and retroflexion views. Internal hemorrhoids                            noted. Complications:            No immediate complications. Estimated blood loss:  None. Estimated Blood Loss:     Estimated blood loss: none. Impression:               - Diverticulosis in the sigmoid colon.                           - The examination was otherwise normal on direct                            and retroflexion views. Internal hemorrhoids noted.                           - No specimens collected. Recommendation:           - Repeat colonoscopy in 10 years for screening                            purposes.                           - Patient has a contact number available for                            emergencies. The signs and symptoms of potential                            delayed complications were discussed with the                            patient. Return to normal activities tomorrow.                            Written discharge instructions were provided to the                            patient.                            - Resume previous diet.                           - Continue present medications. Docia Chuck. Henrene Pastor, MD 06/22/2019 2:21:22 PM This report has been signed electronically.

## 2019-06-26 ENCOUNTER — Telehealth: Payer: Self-pay | Admitting: *Deleted

## 2019-06-26 ENCOUNTER — Telehealth: Payer: Self-pay

## 2019-06-26 NOTE — Telephone Encounter (Signed)
  Follow up Call-  Call back number 06/22/2019 01/06/2017  Post procedure Call Back phone  # [phone]  Permission to leave phone message Yes Yes  Some recent data might be hidden     Patient questions:  Message left to call us if necessary.

## 2019-06-26 NOTE — Telephone Encounter (Signed)
No answer, left message to call if having any issues or concerns, B.Shaleen Talamantez RN 

## 2019-07-01 ENCOUNTER — Other Ambulatory Visit: Payer: Self-pay | Admitting: Family Medicine

## 2019-07-17 DIAGNOSIS — M791 Myalgia, unspecified site: Secondary | ICD-10-CM | POA: Diagnosis not present

## 2019-07-17 DIAGNOSIS — G518 Other disorders of facial nerve: Secondary | ICD-10-CM | POA: Diagnosis not present

## 2019-07-17 DIAGNOSIS — R519 Headache, unspecified: Secondary | ICD-10-CM | POA: Diagnosis not present

## 2019-07-17 DIAGNOSIS — M542 Cervicalgia: Secondary | ICD-10-CM | POA: Diagnosis not present

## 2019-08-05 ENCOUNTER — Encounter: Payer: Self-pay | Admitting: Family Medicine

## 2019-08-05 DIAGNOSIS — K8020 Calculus of gallbladder without cholecystitis without obstruction: Secondary | ICD-10-CM | POA: Insufficient documentation

## 2019-08-27 ENCOUNTER — Other Ambulatory Visit: Payer: Self-pay

## 2019-08-27 ENCOUNTER — Ambulatory Visit (HOSPITAL_COMMUNITY)
Admission: RE | Admit: 2019-08-27 | Discharge: 2019-08-27 | Disposition: A | Discharge status: Home / Self Care | Payer: Medicare Other | Source: Ambulatory Visit | Attending: Cardiology | Admitting: Cardiology

## 2019-08-27 ENCOUNTER — Other Ambulatory Visit: Payer: Self-pay | Admitting: *Deleted

## 2019-08-27 ENCOUNTER — Encounter: Payer: Self-pay | Admitting: Cardiovascular Disease

## 2019-08-27 DIAGNOSIS — I779 Disorder of arteries and arterioles, unspecified: Secondary | ICD-10-CM | POA: Insufficient documentation

## 2019-08-27 DIAGNOSIS — I6523 Occlusion and stenosis of bilateral carotid arteries: Secondary | ICD-10-CM

## 2019-08-27 NOTE — Telephone Encounter (Signed)
error 

## 2019-08-27 NOTE — Progress Notes (Signed)
amb  

## 2019-08-28 ENCOUNTER — Encounter: Payer: Self-pay | Admitting: Vascular Surgery

## 2019-08-28 ENCOUNTER — Other Ambulatory Visit: Payer: Self-pay

## 2019-08-28 ENCOUNTER — Ambulatory Visit (HOSPITAL_COMMUNITY)
Admission: RE | Admit: 2019-08-28 | Discharge: 2019-08-28 | Disposition: A | Discharge status: Home / Self Care | Payer: Medicare Other | Source: Ambulatory Visit | Attending: Vascular Surgery | Admitting: Vascular Surgery

## 2019-08-28 ENCOUNTER — Ambulatory Visit (INDEPENDENT_AMBULATORY_CARE_PROVIDER_SITE_OTHER): Payer: Medicare Other | Admitting: Vascular Surgery

## 2019-08-28 VITALS — BP 123/77 | HR 61 | Temp 97.7°F | Resp 16 | Ht 67.0 in | Wt 198.0 lb

## 2019-08-28 DIAGNOSIS — I6523 Occlusion and stenosis of bilateral carotid arteries: Secondary | ICD-10-CM | POA: Diagnosis not present

## 2019-08-28 DIAGNOSIS — I779 Disorder of arteries and arterioles, unspecified: Secondary | ICD-10-CM

## 2019-08-28 DIAGNOSIS — I714 Abdominal aortic aneurysm, without rupture, unspecified: Secondary | ICD-10-CM

## 2019-08-28 NOTE — Progress Notes (Signed)
Patient name: Tony Hunt MRN: [id number] DOB: 13-Apr-1955 Sex: male  REASON FOR CONSULT: Evaluate for symptomatic high-grade right ICA stenosis  HPI: Tony Hunt is a 64 y.o. male, with history of abdominal aortic aneurysm, laryngeal cancer status post radiation, coronary disease status post remote PCI, hypertension, hyperlipidemia, diastolic CHF that presents for evaluation of possible symptomatic high-grade right ICA stenosis.  Patient states that he was getting an ultrasound to monitor his neck arteries and then was instructed to come here for further evaluation asap.  His duplex with Dr. Lanny Hurst office suggested a high-grade greater than 80% stenosis in the right ICA with a velocity of 272/115.  He does endorse a burning and stinging in both arms from his elbows down into his hands and this has been getting worse over the last 3 to 4 days and this is very similar to burning and stinging he gets in his feet that has been ongoing for 6 months.  This does not appear to be focal and affects both upper extremities and both lower extremities at the same time.  He denies any slurred speech or other focal neurologic events.  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  Previously has been followed by Dr. Trula Slade for abdominal aortic aneurysm and this last measured 3.6 cm in 2020 on duplex.  He remains on Plavix and Crestor.  Past Medical History:  Diagnosis Date  . AAA (abdominal aortic aneurysm) without rupture ([facility]) 02/14/2014   [place] 1/19:  AAA 3 cm  . ALCOHOL ABUSE, HX OF    distant history  . Anxiety    occ panic sx, increased after death of mother  . ANXIETY DEPRESSION 05/10/2007   history of, during difficult relationship  . CAD (coronary artery disease) 08/28/2012   08/25/2012 Successful PTCA/DES x mid LAD // LAD prox 30, mid stent patent; D1 40; LCx ok; OM2 50, 50; RCA prox 20; EF 55-65  //  Nuclear stress test 9/19: EF 58, small inferobasal infarct, no ischemia, Low Risk (inf defect ? diaph atten?)   . Cancer ([facility]) 03/2010   tumor on larynx/tx radiation  . Carotid artery disease ([facility]) 09/12/2017   [place] 6/19:  R 60-79; L 1-39; R vertebral occluded; FU 12 months  . 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 Chronic diastolic CHF (congestive heart failure) ([facility]) 12/21/2017   Echo 11/17/2017 - Mild concentric LVH, EF 65-70, normal wall motion, grade 2 diastolic dysfunction, mild LAE, normal RVSF, trivial TR  . Dilatation of aorta ([facility]) 02/14/2014  . Diverticulosis   . ESOPHAGEAL STRICTURE 05/10/2007  . GERD 05/10/2007  . Heart disease   . HIATAL HERNIA 05/10/2007  . History of echocardiogram    Echo 9/19: mild conc LVH, vigorous LVF, EF 65-70, Gr 2 DD, mild LAE, normal RVSF, trivial AI  . History of radiation therapy   . HYPERLIPIDEMIA 05/10/2007  . HYPERTENSION 05/10/2007  . 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 SLEEP APNEA 05/10/2007   lost 100lb-not now  . Upper airway cough syndrome    Per Dr. Melvyn Novas, pulmonary     Past Surgical History:  Procedure Laterality Date  . APPENDECTOMY    . BACK SURGERY  1997   lower back x3  . CARPOMETACARPAL ([facility]) FUSION OF THUMB Left 04/02/2014   Procedure: CARPOMETACARPAL ([facility]) FUSION OF THUMB/LEFT THUMB INTERPHALNGEAL JOINT FUSION;  Surgeon: Jolyn Nap, MD;  Location: [facility];  Service: Orthopedics;  Laterality: Left;  . COLONOSCOPY    . CORONARY STENT PLACEMENT     2.75 x 12 mm Promus Premier DES was deployed in the mid LAD. The stent was post-dilated with a 3.0 x 9 mm [place] balloon - Mid LAD.  Marland Kitchen LARYNGOSCOPY / BRONCHOSCOPY / ESOPHAGOSCOPY  2012   bx  . LEFT HEART CATH AND CORONARY ANGIOGRAPHY N/A 05/28/2016   Procedure: Left Heart Cath and Coronary Angiography;  Surgeon: Belva Crome, MD;  Location: [facility];  Service: Cardiovascular;  Laterality: N/A;    . LEFT HEART CATHETERIZATION WITH CORONARY ANGIOGRAM N/A 08/25/2012   Procedure: LEFT HEART CATHETERIZATION WITH CORONARY ANGIOGRAM;  Surgeon: Burnell Blanks, MD;  Location: [facility] CATH LAB;  Service: Cardiovascular;  Laterality: N/A;  . LEFT HEART CATHETERIZATION WITH CORONARY ANGIOGRAM N/A 11/23/2013   Procedure: LEFT HEART CATHETERIZATION WITH CORONARY ANGIOGRAM;  Surgeon: Jettie Booze, MD;  Location: [facility] C[facility];  Service: Cardiovascular;  Laterality: N/A;  . WRIST FRACTURE SURGERY  08/21/10   Right wrist x4    Family History  Problem Relation Age of Onset  . Brain cancer Father   . Dementia Father        h/o brain tumor  . Heart disease Mother   . Hypertension Mother   . Heart disease Brother   . Hypertension Brother   . Hyperlipidemia Brother   . Colon polyps Brother   . Colon cancer Neg Hx   . Prostate cancer Neg Hx   . Esophageal cancer Neg Hx   . Pancreatic cancer Neg Hx   . Liver disease Neg Hx   . Throat cancer Neg Hx     SOCIAL HISTORY: Social History   Socioeconomic History  . Marital status: Married    Spouse name: Not on file  . Number of children: 1  . Years of education: Not on file  . Highest education level: Not on file  Occupational History  . Occupation:      Fish farm manager: UNEMPLOYED    Comment: Retired  Tobacco Use  . Smoking status: Former Smoker    Quit date: 09/30/2010    Years since quitting: 8.9  . Smokeless tobacco: Former Network engineer  . Vaping Use: Never used  Substance and Sexual Activity  . Alcohol use: Yes    Comment: occ   . Drug use: No  . Sexual activity: Yes  Other Topics Concern  . Not on file  Social History Narrative   Married 8/13. 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  Noted tinnitus, h/o noise exposure that was service related.    Social Determinants of Health   Financial Resource Strain: Low Risk   . Difficulty of Paying Living Expenses: Not hard at  all  Food Insecurity: No Food Insecurity  . Worried About Charity fundraiser in the Last Year: Never true  . Ran Out of Food in the Last Year: Never true  Transportation Needs: No Transportation Needs  . Lack of Transportation (Medical): No  . Lack of Transportation (Non-Medical): No  Physical Activity: Insufficiently Active  . Days of Exercise per Week: 3 days  . Minutes of Exercise per Session: 40 min  Stress: No Stress Concern Present  . Feeling of Stress : Not at all  Social Connections:   . Frequency of Communication  with Friends and Family:   . Frequency of Social Gatherings with Friends and Family:   . Attends Religious Services:   . Active Member of Clubs or Organizations:   . Attends Archivist Meetings:   Marland Kitchen Marital Status:   Intimate Partner Violence: Not At Risk  . Fear of Current or Ex-Partner: No  . Emotionally Abused: No  . Physically Abused: No  . Sexually Abused: No    Allergies  Allergen Reactions  . Imdur [Isosorbide Dinitrate] Other (See Comments)    Headache at 60mg  dose, but able to tolerate 30mg  per day  . Iohexol Anaphylaxis and Other (See Comments)    OK with 13 hour prep  . Lisinopril Other (See Comments)    Elevated cr- improved off med  . Pravastatin Nausea Only and Other (See Comments)    Intense headache  . Codeine Itching  . Iodine-131 Other (See Comments)    Passed out  . Simvastatin Other (See Comments)    Edema  . Statins Other (See Comments)    Sig edema on simvastatin    Current Outpatient Medications  Medication Sig Dispense Refill  . clopidogrel (PLAVIX) 75 MG tablet TAKE 1 TABLET DAILY WITH BREAKFAST 90 tablet 3  . clotrimazole-betamethasone (LOTRISONE) cream Apply 1 application topically 2 (two) times daily. 30 g 0  . cyanocobalamin (,VITAMIN B-12,) 1000 MCG/ML injection Inject 1037mcg monthly. 10 mL 1  . Evolocumab (REPATHA SURECLICK) 983 MG/ML SOAJ Inject 1 pen into the skin every 14 (fourteen) days. 6 pen 3  .  fenofibrate 160 MG tablet Take 1 tablet (160 mg total) by mouth daily. 90 tablet 3  . gabapentin (NEURONTIN) 100 MG capsule Take 100 mg by mouth as needed (for headache).    . gabapentin (NEURONTIN) 600 MG tablet Take 1 tablet (600 mg total) by mouth at bedtime.    . Melatonin 10 MG TABS Take 10 mg by mouth at bedtime.    . metoprolol tartrate (LOPRESSOR) 25 MG tablet TAKE ONE-HALF (1/2) TABLET TWICE A DAY 90 tablet 3  . metroNIDAZOLE (FLAGYL) 500 MG tablet Take 1 tablet (500 mg total) by mouth 3 (three) times daily. 30 tablet 0  . nitroGLYCERIN (NITROSTAT) 0.4 MG SL tablet Place 1 tablet (0.4 mg total) under the tongue every 5 (five) minutes as needed for chest pain (do not exceed 3 pills during one episode). 25 tablet 6  . pantoprazole (PROTONIX) 40 MG tablet TAKE 1 TABLET DAILY 90 tablet 3  . rosuvastatin (CRESTOR) 20 MG tablet TAKE 1 TABLET DAILY 90 tablet 3  . SYNTHROID 100 MCG tablet TAKE 1 TABLET DAILY BEFORE BREAKFAST 90 tablet 0  . SYRINGE-NEEDLE, DISP, 3 ML 25G X 1" 3 ML MISC Patient is to use to self inject 1 ml of vitamin B12 per provider instructions. 10 each 1  . tamsulosin (FLOMAX) 0.4 MG CAPS capsule TAKE 1 CAPSULE DAILY 90 capsule 3  . ciprofloxacin (CIPRO) 500 MG tablet Take 1 tablet (500 mg total) by mouth 2 (two) times daily. (Patient not taking: Reported on 08/28/2019) 20 tablet 0   No current facility-administered medications for this visit.    REVIEW OF SYSTEMS:  [X]  denotes positive finding, [ ]  denotes negative finding Cardiac  Comments:  Chest pain or chest pressure:    Shortness of breath upon exertion:    Short of breath when lying flat:    Irregular heart rhythm:        Vascular    Pain in calf, thigh, or  hip brought on by ambulation:    Pain in feet at night that wakes you up from your sleep:     Blood clot in your veins:    Leg swelling:         Pulmonary    Oxygen at home:    Productive cough:     Wheezing:         Neurologic    Sudden weakness in  arms or legs:     Sudden numbness in arms or legs:     Sudden onset of difficulty speaking or slurred speech:    Temporary loss of vision in one eye:     Problems with dizziness:         Gastrointestinal    Blood in stool:     Vomited blood:         Genitourinary    Burning when urinating:     Blood in urine:        Psychiatric    Major depression:         Hematologic    Bleeding problems:    Problems with blood clotting too easily:        Skin    Rashes or ulcers:        Constitutional    Fever or chills:      PHYSICAL EXAM: Vitals:   08/28/19 1348 08/28/19 1353  BP: 131/77 123/77  Pulse: (!) 59 61  Resp: 16   Temp: 97.7 F (36.5 C)   TempSrc: Temporal   SpO2: 96%   Weight: 198 lb (89.8 kg)   Height: 5\' 7"  (1.702 m)     GENERAL: The patient is a well-nourished male, in no acute distress. The vital signs are documented above. CARDIAC: There is a regular rate and rhythm.  VASCULAR:  Appreciable carotid pulse bilaterally PULMONARY: There is good air exchange bilaterally without wheezing or rales. ABDOMEN: Soft and non-tender with normal pitched bowel sounds.  MUSCULOSKELETAL: There are no major deformities or cyanosis. NEUROLOGIC: No focal weakness or paresthesias are detected. SKIN: There are no ulcers or rashes noted. PSYCHIATRIC: The patient has a normal affect.  DATA:   Carotid duplex here suggests 60 to 79% stenosis in the right proximal ICA and the report from cardiology suggest a high-grade stenosis on the right based on their velocity criteria.  Minimal disease in the left ICA.  Assessment/Plan:  64 year old male presents for evaluation of high-grade stenosis in the right ICA based on his duplex from cardiology.  The duplex was repeated here and shows a 60 to 79% stenosis based on the velocity criteria in our lab.  I think this is an asymptomatic carotid lesion and discussed normally reserve intervention for greater than 80%.  He describes 6 months of  burning and stinging in both of his feet and now similar symptoms in both hands from the elbows down.  This does not appear focal in nature and appears to be fairly symmetric in bilateral upper and lower extremities.  I cannot really decipher any focal symptoms in further discussion with him to suggest this is symptomatic at this time.  I have ordered a CTA neck to further evaluate this lesion.  Discussed with him that I think if he does need intervention would evaluate for possible TCAR versus transfemoral stent given the amount of radiation that he had to the neck for his laryngeal cancer.  I have also ordered an updated AAA duplex since his last duplex was done in August 2020  and it measured 3.8 cm.  I will have him follow with me in the office as soon as the CT neck is done and we can discuss next steps.  I have asked that he continue Plavix and statin in the interim.  We will need to add aspirin if proceed with TCAR.   [name], MD [facility] Office: [phone]

## 2019-09-04 ENCOUNTER — Other Ambulatory Visit: Payer: Self-pay | Admitting: *Deleted

## 2019-09-04 DIAGNOSIS — I714 Abdominal aortic aneurysm, without rupture, unspecified: Secondary | ICD-10-CM

## 2019-09-05 ENCOUNTER — Other Ambulatory Visit: Payer: Self-pay | Admitting: *Deleted

## 2019-09-05 ENCOUNTER — Other Ambulatory Visit: Payer: Self-pay

## 2019-09-05 DIAGNOSIS — I723 Aneurysm of iliac artery: Secondary | ICD-10-CM

## 2019-09-05 DIAGNOSIS — I714 Abdominal aortic aneurysm, without rupture, unspecified: Secondary | ICD-10-CM

## 2019-09-05 DIAGNOSIS — I6523 Occlusion and stenosis of bilateral carotid arteries: Secondary | ICD-10-CM

## 2019-09-06 ENCOUNTER — Other Ambulatory Visit: Payer: Self-pay

## 2019-09-06 ENCOUNTER — Ambulatory Visit (HOSPITAL_COMMUNITY)
Admission: RE | Admit: 2019-09-06 | Discharge: 2019-09-06 | Disposition: A | Discharge status: Home / Self Care | Payer: Medicare Other | Source: Ambulatory Visit | Attending: Vascular Surgery | Admitting: Vascular Surgery

## 2019-09-06 DIAGNOSIS — I6523 Occlusion and stenosis of bilateral carotid arteries: Secondary | ICD-10-CM | POA: Diagnosis present

## 2019-09-06 LAB — POCT I-STAT CREATININE: Creatinine, Ser: 1.9 mg/dL — ABNORMAL HIGH (ref 0.61–1.24)

## 2019-09-06 MED ORDER — SODIUM CHLORIDE (PF) 0.9 % IJ SOLN
INTRAMUSCULAR | Status: AC
  Filled 2019-09-06: qty 50

## 2019-09-07 ENCOUNTER — Encounter (HOSPITAL_COMMUNITY): Payer: Self-pay

## 2019-09-07 ENCOUNTER — Ambulatory Visit (HOSPITAL_COMMUNITY)
Admission: RE | Admit: 2019-09-07 | Discharge: 2019-09-07 | Disposition: A | Discharge status: Home / Self Care | Payer: Medicare Other | Source: Ambulatory Visit | Attending: Vascular Surgery | Admitting: Vascular Surgery

## 2019-09-07 DIAGNOSIS — I6523 Occlusion and stenosis of bilateral carotid arteries: Secondary | ICD-10-CM | POA: Diagnosis present

## 2019-09-07 DIAGNOSIS — I63233 Cerebral infarction due to unspecified occlusion or stenosis of bilateral carotid arteries: Secondary | ICD-10-CM | POA: Diagnosis not present

## 2019-09-07 DIAGNOSIS — I7090 Unspecified atherosclerosis: Secondary | ICD-10-CM | POA: Diagnosis not present

## 2019-09-07 MED ORDER — SODIUM CHLORIDE (PF) 0.9 % IJ SOLN
INTRAMUSCULAR | Status: AC
  Filled 2019-09-07: qty 50

## 2019-09-07 MED ORDER — IOHEXOL 350 MG/ML SOLN
100.0000 mL | Freq: Once | INTRAVENOUS | Status: AC | PRN
  Administered 2019-09-07: 60 mL via INTRAVENOUS

## 2019-09-11 ENCOUNTER — Ambulatory Visit (INDEPENDENT_AMBULATORY_CARE_PROVIDER_SITE_OTHER): Payer: Medicare Other | Admitting: Vascular Surgery

## 2019-09-11 ENCOUNTER — Ambulatory Visit (HOSPITAL_COMMUNITY)
Admission: RE | Admit: 2019-09-11 | Discharge: 2019-09-11 | Disposition: A | Discharge status: Home / Self Care | Payer: Medicare Other | Source: Ambulatory Visit | Attending: Vascular Surgery | Admitting: Vascular Surgery

## 2019-09-11 ENCOUNTER — Other Ambulatory Visit: Payer: Self-pay

## 2019-09-11 ENCOUNTER — Encounter: Payer: Self-pay | Admitting: Vascular Surgery

## 2019-09-11 VITALS — BP 129/77 | HR 47 | Temp 97.2°F | Resp 16 | Ht 67.0 in | Wt 197.0 lb

## 2019-09-11 DIAGNOSIS — I714 Abdominal aortic aneurysm, without rupture, unspecified: Secondary | ICD-10-CM

## 2019-09-11 DIAGNOSIS — I779 Disorder of arteries and arterioles, unspecified: Secondary | ICD-10-CM | POA: Diagnosis not present

## 2019-09-11 NOTE — Progress Notes (Signed)
Patient name: Tony Hunt MRN: [id number] DOB: [birth year] Sex: male  REASON FOR CONSULT: Follow-up after CTA neck to evaluate high-grade right ICA stenosis  HPI: Tony Hunt is a 64 y.o. male, with history of abdominal aortic aneurysm, laryngeal cancer status post radiation, coronary disease status post remote PCI, hypertension, hyperlipidemia, diastolic CHF that presents for follow-up after CTA neck to evaluate high-grade right ICA stenosis.  He recently had a duplex at  Dr. Lanny Hurst office suggested a high-grade greater than 80% stenosis in the right ICA with a velocity of 272/115.  He did endorse burning and stinging in both arms from his elbows down into his hands and this has been getting worse over the last 3 to 4 days and this is very similar to burning and stinging he gets in his feet that has been ongoing for 6 months.  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  Previously has been followed by Dr. Trula Slade for abdominal aortic aneurysm and this last measured 3.6 cm in 2020 on duplex.  He remains on Plavix and Crestor.  On follow-up today no new neurologic symptoms.  Still taking his Plavix and Crestor.  Past Medical History:  Diagnosis Date  . AAA (abdominal aortic aneurysm) without rupture ([facility]) 02/14/2014   [place] 1/19:  AAA 3 cm  . ALCOHOL ABUSE, HX OF    distant history  . Anxiety    occ panic sx, increased after death of mother  . ANXIETY DEPRESSION 05/10/2007   history of, during difficult relationship  . CAD (coronary artery disease) 08/28/2012   08/25/2012 Successful PTCA/DES x mid LAD // LAD prox 30, mid stent patent; D1 40; LCx ok; OM2 50, 50; RCA prox 20; EF 55-65  // Nuclear stress test 9/19: EF 58, small inferobasal infarct, no ischemia, Low Risk (inf defect ? diaph atten?)   . Cancer ([facility]) 03/2010   tumor on larynx/tx radiation  . Carotid artery disease ([facility]) 09/12/2017     [place] 6/19:  R 60-79; L 1-39; R vertebral occluded; FU 12 months  . 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 Chronic diastolic CHF (congestive heart failure) ([facility]) 12/21/2017   Echo 11/17/2017 - Mild concentric LVH, EF 65-70, normal wall motion, grade 2 diastolic dysfunction, mild LAE, normal RVSF, trivial TR  . Dilatation of aorta ([facility]) 02/14/2014  . Diverticulosis   . ESOPHAGEAL STRICTURE 05/10/2007  . GERD 05/10/2007  . Heart disease   . HIATAL HERNIA 05/10/2007  . History of echocardiogram    Echo 9/19: mild conc LVH, vigorous LVF, EF 65-70, Gr 2 DD, mild LAE, normal RVSF, trivial AI  . History of radiation therapy   . HYPERLIPIDEMIA 05/10/2007  . HYPERTENSION 05/10/2007  . 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 SLEEP APNEA 05/10/2007   lost 100lb-not now  . Upper airway cough syndrome    Per Dr. Melvyn Novas, pulmonary     Past Surgical History:  Procedure Laterality Date  . APPENDECTOMY    . BACK SURGERY  1997   lower back x3  . CARPOMETACARPAL ([facility]) FUSION OF THUMB Left 04/02/2014   Procedure: CARPOMETACARPAL ([facility]) FUSION OF THUMB/LEFT THUMB INTERPHALNGEAL JOINT FUSION;  Surgeon: Jolyn Nap, MD;  Location: [facility];  Service: Orthopedics;  Laterality: Left;  . COLONOSCOPY    . CORONARY STENT PLACEMENT     2.75 x  12 mm Promus Premier DES was deployed in the mid LAD. The stent was post-dilated with a 3.0 x 9 mm  balloon - Mid LAD.  Marland Kitchen LARYNGOSCOPY / BRONCHOSCOPY / ESOPHAGOSCOPY  2012   bx  . LEFT HEART CATH AND CORONARY ANGIOGRAPHY N/A 05/28/2016   Procedure: Left Heart Cath and Coronary Angiography;  Surgeon: Belva Crome, MD;  Location: [facility];  Service: Cardiovascular;  Laterality: N/A;  . LEFT HEART CATHETERIZATION WITH CORONARY ANGIOGRAM N/A 08/25/2012   Procedure: LEFT HEART CATHETERIZATION WITH CORONARY ANGIOGRAM;  Surgeon: Burnell Blanks, MD;  Location: [facility] CATH LAB;  Service:  Cardiovascular;  Laterality: N/A;  . LEFT HEART CATHETERIZATION WITH CORONARY ANGIOGRAM N/A 11/23/2013   Procedure: LEFT HEART CATHETERIZATION WITH CORONARY ANGIOGRAM;  Surgeon: Jettie Booze, MD;  Location: [facility] CATH LAB;  Service: Cardiovascular;  Laterality: N/A;  . WRIST FRACTURE SURGERY  08/21/10   Right wrist x4    Family History  Problem Relation Age of Onset  . Brain cancer Father   . Dementia Father        h/o brain tumor  . Heart disease Mother   . Hypertension Mother   . Heart disease Brother   . Hypertension Brother   . Hyperlipidemia Brother   . Colon polyps Brother   . Colon cancer Neg Hx   . Prostate cancer Neg Hx   . Esophageal cancer Neg Hx   . Pancreatic cancer Neg Hx   . Liver disease Neg Hx   . Throat cancer Neg Hx     SOCIAL HISTORY: Social History   Socioeconomic History  . Marital status: Married    Spouse name: Not on file  . Number of children: 1  . Years of education: Not on file  . Highest education level: Not on file  Occupational History  . Occupation:      Fish farm manager: UNEMPLOYED    Comment: Retired  Tobacco Use  . Smoking status: Former Smoker    Quit date: 09/30/2010    Years since quitting: 8.9  . Smokeless tobacco: Former Network engineer  . Vaping Use: Never used  Substance and Sexual Activity  . Alcohol use: Yes    Comment: occ   . Drug use: No  . Sexual activity: Yes  Other Topics Concern  . Not on file  Social History Narrative   Married 8/13. 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  Noted tinnitus, h/o noise exposure that was service related.    Social Determinants of Health   Financial Resource Strain: Low Risk   . Difficulty of Paying Living Expenses: Not hard at all  Food Insecurity: No Food Insecurity  . Worried About Charity fundraiser in the Last Year: Never true  . Ran Out of Food in the Last Year: Never true  Transportation Needs: No Transportation Needs    . Lack of Transportation (Medical): No  . Lack of Transportation (Non-Medical): No  Physical Activity: Insufficiently Active  . Days of Exercise per Week: 3 days  . Minutes of Exercise per Session: 40 min  Stress: No Stress Concern Present  . Feeling of Stress : Not at all  Social Connections:   . Frequency of Communication with Friends and Family:   . Frequency of Social Gatherings with Friends and Family:   . Attends Religious Services:   . Active Member of Clubs or Organizations:   .  Attends Archivist Meetings:   Marland Kitchen Marital Status:   Intimate Partner Violence: Not At Risk  . Fear of Current or Ex-Partner: No  . Emotionally Abused: No  . Physically Abused: No  . Sexually Abused: No    Allergies  Allergen Reactions  . Imdur [Isosorbide Dinitrate] Other (See Comments)    Headache at 60mg  dose, but able to tolerate 30mg  per day  . Iohexol Anaphylaxis and Other (See Comments)    OK with 13 hour prep  . Lisinopril Other (See Comments)    Elevated cr- improved off med  . Pravastatin Nausea Only and Other (See Comments)    Intense headache  . Codeine Itching  . Iodine-131 Other (See Comments)    Passed out  . Simvastatin Other (See Comments)    Edema  . Statins Other (See Comments)    Sig edema on simvastatin    Current Outpatient Medications  Medication Sig Dispense Refill  . clopidogrel (PLAVIX) 75 MG tablet TAKE 1 TABLET DAILY WITH BREAKFAST 90 tablet 3  . clotrimazole-betamethasone (LOTRISONE) cream Apply 1 application topically 2 (two) times daily. 30 g 0  . cyanocobalamin (,VITAMIN B-12,) 1000 MCG/ML injection Inject 1073mcg monthly. 10 mL 1  . Evolocumab (REPATHA SURECLICK) 387 MG/ML SOAJ Inject 1 pen into the skin every 14 (fourteen) days. 6 pen 3  . fenofibrate 160 MG tablet Take 1 tablet (160 mg total) by mouth daily. 90 tablet 3  . gabapentin (NEURONTIN) 600 MG tablet Take 1 tablet (600 mg total) by mouth at bedtime.    . Melatonin 10 MG TABS Take 10  mg by mouth at bedtime.    . metoprolol tartrate (LOPRESSOR) 25 MG tablet TAKE ONE-HALF (1/2) TABLET TWICE A DAY 90 tablet 3  . metroNIDAZOLE (FLAGYL) 500 MG tablet Take 1 tablet (500 mg total) by mouth 3 (three) times daily. 30 tablet 0  . nitroGLYCERIN (NITROSTAT) 0.4 MG SL tablet Place 1 tablet (0.4 mg total) under the tongue every 5 (five) minutes as needed for chest pain (do not exceed 3 pills during one episode). 25 tablet 6  . pantoprazole (PROTONIX) 40 MG tablet TAKE 1 TABLET DAILY 90 tablet 3  . rosuvastatin (CRESTOR) 20 MG tablet TAKE 1 TABLET DAILY 90 tablet 3  . SYNTHROID 100 MCG tablet TAKE 1 TABLET DAILY BEFORE BREAKFAST 90 tablet 0  . SYRINGE-NEEDLE, DISP, 3 ML 25G X 1" 3 ML MISC Patient is to use to self inject 1 ml of vitamin B12 per provider instructions. 10 each 1  . tamsulosin (FLOMAX) 0.4 MG CAPS capsule TAKE 1 CAPSULE DAILY 90 capsule 3  . ciprofloxacin (CIPRO) 500 MG tablet Take 1 tablet (500 mg total) by mouth 2 (two) times daily. (Patient not taking: Reported on 08/28/2019) 20 tablet 0  . gabapentin (NEURONTIN) 100 MG capsule Take 100 mg by mouth as needed (for headache). (Patient not taking: Reported on 09/11/2019)     No current facility-administered medications for this visit.    REVIEW OF SYSTEMS:  [X]  denotes positive finding, [ ]  denotes negative finding Cardiac  Comments:  Chest pain or chest pressure:    Shortness of breath upon exertion:    Short of breath when lying flat:    Irregular heart rhythm:        Vascular    Pain in calf, thigh, or hip brought on by ambulation:    Pain in feet at night that wakes you up from your sleep:     Blood clot in  your veins:    Leg swelling:         Pulmonary    Oxygen at home:    Productive cough:     Wheezing:         Neurologic    Sudden weakness in arms or legs:     Sudden numbness in arms or legs:     Sudden onset of difficulty speaking or slurred speech:    Temporary loss of vision in one eye:     Problems  with dizziness:         Gastrointestinal    Blood in stool:     Vomited blood:         Genitourinary    Burning when urinating:     Blood in urine:        Psychiatric    Major depression:         Hematologic    Bleeding problems:    Problems with blood clotting too easily:        Skin    Rashes or ulcers:        Constitutional    Fever or chills:      PHYSICAL EXAM: Vitals:   09/11/19 0830  BP: 129/77  Pulse: (!) 47  Resp: 16  Temp: (!) 97.2 F (36.2 C)  TempSrc: Temporal  SpO2: 99%  Weight: 197 lb (89.4 kg)  Height: 5\' 7"  (1.702 m)    GENERAL: The patient is a well-nourished male, in no acute distress. The vital signs are documented above. CARDIAC: There is a regular rate and rhythm.  VASCULAR:  Appreciable carotid pulse bilaterally PULMONARY: There is good air exchange bilaterally without wheezing or rales. ABDOMEN: Soft and non-tender with normal pitched bowel sounds.  MUSCULOSKELETAL: There are no major deformities or cyanosis. NEUROLOGIC: No focal weakness or paresthesias are detected. SKIN: There are no ulcers or rashes noted. PSYCHIATRIC: The patient has a normal affect.  DATA:   CTA neck on my review shows a high-grade right ICA stenosis approaching 80% with ulceration.  Assessment/Plan:  64 year old male presents for further follow-up after CTA neck to evaluate high-grade stenosis in the right ICA based on his duplex from cardiology.  Discussed with him in detail today after review of CTA would be reasonable to proceed with right carotid intervention given high-grade stenosis approaching 80% and ulceration.  Given previous neck radiation I think TCAR would be an excellent option for him.  Discussed option for small incision just above the clavicle with sheath placement in the common carotid artery for stent of the ICA lesion with flow reversal.  We discussed stroke risk of about 1.4%.  I will have him start a baby aspirin in addition to his Plavix and  statin for planned TCAR.  He want to schedule this at the end of August which I think is reasonable given this is an asymptomatic lesion.  In addition we repeated his AAA duplex today and I think his aneurysm is essentially unchanged and measures 3.61 cm in maximal diameter.  We will follow this up with a yearly AAA duplex which I updated him about.  [name], MD [facility] Office: [phone]

## 2019-09-13 ENCOUNTER — Other Ambulatory Visit: Payer: Self-pay

## 2019-10-02 ENCOUNTER — Other Ambulatory Visit: Payer: Self-pay | Admitting: Cardiovascular Disease

## 2019-10-02 NOTE — Telephone Encounter (Signed)
*  STAT* If patient is at the pharmacy, call can be transferred to refill team.   1. Which medications need to be refilled? (please list name of each medication and dose if known)   Evolocumab (REPATHA SURECLICK) 580 MG/ML SOAJ     2. Which pharmacy/location (including street and city if local pharmacy) is medication to be sent to?   [facility] [place], [place]   3. Do they need a 30 day or 90 day supply? 90 with refills

## 2019-10-02 NOTE — Telephone Encounter (Signed)
This was already sent in today.

## 2019-10-02 NOTE — Pre-Procedure Instructions (Addendum)
Tony Hunt  10/02/2019      [facility] - [address], [place] [address] Phone: [phone] Fax: [phone]  [facility] [place] [place] - [address] [place] ([place]) [place] [address] Phone: [phone] Fax: [phone]  [facility] [place], [place] [address] [place] [address] Phone: [phone] Fax: [phone]    Your procedure is scheduled on Aug. 16  Report to [facility] Entrance A at 7:55  A.M.  Call this number if you have problems the morning of surgery:  [phone]   Remember:  Do not eat or drink after midnight.      Take these medicines the morning of surgery with A SIP OF WATER :              Metoprolol (lopressor)             Pantoprazole (protonix)             Synthroid              flomax (tamsulosin)              Aspirin              plavix               If needed: nitroglycerine                                 7 days prior to surgery STOP taking  Aleve, Naproxen, Ibuprofen, Motrin, Advil, Goody's, BC's, all herbal medications, fish oil, and all vitamins.    Do not wear jewelry.  Do not wear lotions, powders, or perfumes, or deodorant.  Do not shave 48 hours prior to surgery.  Men may shave face and neck.  Do not bring valuables to the hospital.  [facility] is not responsible for any belongings or valuables.  Contacts, dentures or bridgework may not be worn into surgery.  Leave your suitcase in the car.  After surgery it may be brought to your room.  For patients admitted to the hospital, discharge time will be determined by your treatment team.  Patients discharged the day of surgery will not be allowed to drive home.    Special instructions:  [facility]- Preparing For Surgery  Before surgery, you can play an important role. Because skin is not sterile,  your skin needs to be as free of germs as possible. You can reduce the number of germs on your skin by washing with CHG (chlorahexidine gluconate) Soap before surgery.  CHG is an antiseptic cleaner which kills germs and bonds with the skin to continue killing germs even after washing.    Oral Hygiene is also important to reduce your risk of infection.  Remember - BRUSH YOUR TEETH THE MORNING OF SURGERY WITH YOUR REGULAR TOOTHPASTE  Please do not use if you have an allergy to CHG or antibacterial soaps. If your skin becomes reddened/irritated stop using the CHG.  Do not shave (including legs and underarms) for at least 48 hours prior to first CHG shower. It is OK to shave your face.  Please follow these instructions carefully.   1. Shower the NIGHT BEFORE SURGERY and the MORNING OF SURGERY with CHG.   2. If you  chose to wash your hair, wash your hair first as usual with your normal shampoo.  3. After you shampoo, rinse your hair and body thoroughly to remove the shampoo.  4. Use CHG as you would any other liquid soap. You can apply CHG directly to the skin and wash gently with a scrungie or a clean washcloth.   5. Apply the CHG Soap to your body ONLY FROM THE NECK DOWN.  Do not use on open wounds or open sores. Avoid contact with your eyes, ears, mouth and genitals (private parts). Wash Face and genitals (private parts)  with your normal soap.  6. Wash thoroughly, paying special attention to the area where your surgery will be performed.  7. Thoroughly rinse your body with warm water from the neck down.  8. DO NOT shower/wash with your normal soap after using and rinsing off the CHG Soap.  9. Pat yourself dry with a CLEAN TOWEL.  10. Wear CLEAN PAJAMAS to bed the night before surgery, wear comfortable clothes the morning of surgery  11. Place CLEAN SHEETS on your bed the night of your first shower and DO NOT SLEEP WITH PETS.    Day of Surgery:  Do not apply any deodorants/lotions.   Please wear clean clothes to the hospital/surgery center.   Remember to brush your teeth WITH YOUR REGULAR TOOTHPASTE.    Please read over the following fact sheets that you were given.

## 2019-10-03 ENCOUNTER — Encounter (HOSPITAL_COMMUNITY): Payer: Self-pay

## 2019-10-03 ENCOUNTER — Telehealth: Payer: Self-pay | Admitting: *Deleted

## 2019-10-03 ENCOUNTER — Encounter (HOSPITAL_COMMUNITY)
Admission: RE | Admit: 2019-10-03 | Discharge: 2019-10-03 | Disposition: A | Discharge status: Home / Self Care | Payer: Medicare Other | Source: Ambulatory Visit | Attending: Vascular Surgery | Admitting: Vascular Surgery

## 2019-10-03 ENCOUNTER — Other Ambulatory Visit: Payer: Self-pay

## 2019-10-03 DIAGNOSIS — I714 Abdominal aortic aneurysm, without rupture: Secondary | ICD-10-CM | POA: Insufficient documentation

## 2019-10-03 DIAGNOSIS — N189 Chronic kidney disease, unspecified: Secondary | ICD-10-CM | POA: Insufficient documentation

## 2019-10-03 DIAGNOSIS — Z79899 Other long term (current) drug therapy: Secondary | ICD-10-CM | POA: Diagnosis not present

## 2019-10-03 DIAGNOSIS — I6523 Occlusion and stenosis of bilateral carotid arteries: Secondary | ICD-10-CM | POA: Diagnosis not present

## 2019-10-03 DIAGNOSIS — I2510 Atherosclerotic heart disease of native coronary artery without angina pectoris: Secondary | ICD-10-CM | POA: Insufficient documentation

## 2019-10-03 DIAGNOSIS — E039 Hypothyroidism, unspecified: Secondary | ICD-10-CM | POA: Insufficient documentation

## 2019-10-03 DIAGNOSIS — F418 Other specified anxiety disorders: Secondary | ICD-10-CM | POA: Insufficient documentation

## 2019-10-03 DIAGNOSIS — Z9049 Acquired absence of other specified parts of digestive tract: Secondary | ICD-10-CM | POA: Diagnosis not present

## 2019-10-03 DIAGNOSIS — K219 Gastro-esophageal reflux disease without esophagitis: Secondary | ICD-10-CM | POA: Diagnosis not present

## 2019-10-03 DIAGNOSIS — Z01818 Encounter for other preprocedural examination: Secondary | ICD-10-CM | POA: Insufficient documentation

## 2019-10-03 DIAGNOSIS — Z955 Presence of coronary angioplasty implant and graft: Secondary | ICD-10-CM | POA: Insufficient documentation

## 2019-10-03 DIAGNOSIS — E785 Hyperlipidemia, unspecified: Secondary | ICD-10-CM | POA: Diagnosis not present

## 2019-10-03 DIAGNOSIS — R0602 Shortness of breath: Secondary | ICD-10-CM | POA: Insufficient documentation

## 2019-10-03 DIAGNOSIS — I5032 Chronic diastolic (congestive) heart failure: Secondary | ICD-10-CM | POA: Diagnosis not present

## 2019-10-03 DIAGNOSIS — R519 Headache, unspecified: Secondary | ICD-10-CM | POA: Diagnosis not present

## 2019-10-03 DIAGNOSIS — I130 Hypertensive heart and chronic kidney disease with heart failure and stage 1 through stage 4 chronic kidney disease, or unspecified chronic kidney disease: Secondary | ICD-10-CM | POA: Insufficient documentation

## 2019-10-03 DIAGNOSIS — Z7982 Long term (current) use of aspirin: Secondary | ICD-10-CM | POA: Insufficient documentation

## 2019-10-03 DIAGNOSIS — Z79890 Hormone replacement therapy (postmenopausal): Secondary | ICD-10-CM | POA: Diagnosis not present

## 2019-10-03 HISTORY — DX: Chronic kidney disease, unspecified: N18.9

## 2019-10-03 LAB — URINALYSIS, ROUTINE W REFLEX MICROSCOPIC
Bacteria, UA: NONE SEEN
Bilirubin Urine: NEGATIVE
Glucose, UA: NEGATIVE mg/dL
Hgb urine dipstick: NEGATIVE
Ketones, ur: NEGATIVE mg/dL
Leukocytes,Ua: NEGATIVE
Nitrite: NEGATIVE
Protein, ur: 100 mg/dL — AB
Specific Gravity, Urine: 1.008 (ref 1.005–1.030)
pH: 6 (ref 5.0–8.0)

## 2019-10-03 LAB — PROTIME-INR
INR: 1 (ref 0.8–1.2)
Prothrombin Time: 13.2 seconds (ref 11.4–15.2)

## 2019-10-03 LAB — TYPE AND SCREEN
ABO/RH(D): A POS
Antibody Screen: NEGATIVE

## 2019-10-03 LAB — COMPREHENSIVE METABOLIC PANEL
ALT: 16 U/L (ref 0–44)
AST: 21 U/L (ref 15–41)
Albumin: 3.9 g/dL (ref 3.5–5.0)
Alkaline Phosphatase: 34 U/L — ABNORMAL LOW (ref 38–126)
Anion gap: 6 (ref 5–15)
BUN: 17 mg/dL (ref 8–23)
CO2: 28 mmol/L (ref 22–32)
Calcium: 9.3 mg/dL (ref 8.9–10.3)
Chloride: 107 mmol/L (ref 98–111)
Creatinine, Ser: 1.67 mg/dL — ABNORMAL HIGH (ref 0.61–1.24)
GFR calc Af Amer: 49 mL/min — ABNORMAL LOW (ref >60)
GFR calc non Af Amer: 42 mL/min — ABNORMAL LOW (ref >60)
Glucose, Bld: 79 mg/dL (ref 70–99)
Potassium: 4 mmol/L (ref 3.5–5.1)
Sodium: 141 mmol/L (ref 135–145)
Total Bilirubin: 0.8 mg/dL (ref 0.3–1.2)
Total Protein: 6.7 g/dL (ref 6.5–8.1)

## 2019-10-03 LAB — SURGICAL PCR SCREEN
MRSA, PCR: NEGATIVE
Staphylococcus aureus: NEGATIVE

## 2019-10-03 LAB — CBC
HCT: 46.8 % (ref 39.0–52.0)
Hemoglobin: 15.2 g/dL (ref 13.0–17.0)
MCH: 30.5 pg (ref 26.0–34.0)
MCHC: 32.5 g/dL (ref 30.0–36.0)
MCV: 93.8 fL (ref 80.0–100.0)
Platelets: 282 10*3/uL (ref 150–400)
RBC: 4.99 MIL/uL (ref 4.22–5.81)
RDW: 12.7 % (ref 11.5–15.5)
WBC: 6 10*3/uL (ref 4.0–10.5)
nRBC: 0 % (ref 0.0–0.2)

## 2019-10-03 LAB — APTT: aPTT: 31 seconds (ref 24–36)

## 2019-10-03 NOTE — Anesthesia Preprocedure Evaluation (Addendum)
Anesthesia Evaluation  Patient identified by MRN, date of birth, ID band Patient awake    Reviewed: Allergy & Precautions, NPO status , Patient's Chart, lab work & pertinent test results  Airway Mallampati: II  TM Distance: >3 FB Neck ROM: Full    Dental no notable dental hx.    Pulmonary neg pulmonary ROS, former smoker,    Pulmonary exam normal breath sounds clear to auscultation       Cardiovascular hypertension, + CAD, + Cardiac Stents and + Peripheral Vascular Disease  Normal cardiovascular exam Rhythm:Regular Rate:Normal     Neuro/Psych negative neurological ROS  negative psych ROS   GI/Hepatic negative GI ROS, Neg liver ROS,   Endo/Other  Hypothyroidism   Renal/GU Renal InsufficiencyRenal disease  negative genitourinary   Musculoskeletal negative musculoskeletal ROS (+)   Abdominal   Peds negative pediatric ROS (+)  Hematology negative hematology ROS (+)   Anesthesia Other Findings   Reproductive/Obstetrics negative OB ROS                            Anesthesia Physical Anesthesia Plan  ASA: III  Anesthesia Plan: General   Post-op Pain Management:    Induction: Intravenous  PONV Risk Score and Plan: 2 and Ondansetron, Dexamethasone and Treatment may vary due to age or medical condition  Airway Management Planned: Oral ETT  Additional Equipment: Arterial line  Intra-op Plan:   Post-operative Plan: Extubation in OR  Informed Consent: I have reviewed the patients History and Physical, chart, labs and discussed the procedure including the risks, benefits and alternatives for the proposed anesthesia with the patient or authorized representative who has indicated his/her understanding and acceptance.     Dental advisory given  Plan Discussed with: CRNA and Surgeon  Anesthesia Plan Comments: (PAT note written by Myra Gianotti, PA-C. )       Anesthesia Quick  Evaluation

## 2019-10-03 NOTE — Progress Notes (Addendum)
Anesthesia PAT Evaluation:  Case: [id number] Date/Time: 10/15/19 0940   Procedure: RIGHT TRANSCAROTID ARTERY REVASCULARIZATION (Right )   Anesthesia type: General   Pre-op diagnosis: RIGHT CAROTID ARTERY STENOSIS   Location: [facility] OR ROOM 16 / [facility]   Surgeons: [name], MD      DISCUSSION: Patient is a 64 year old male scheduled for the above procedure. He was referred to [facility] by his cardiologist Dr. Acie Fredrickson. I evaluated [name] during his 10/03/19 PAT visit.   History includes former smoker (quit 09/30/10), laryngeal cancer (s/p radiation 2012), HTN, HLD, chronic diastolic CHF, CAD (s/p DES mid LAD 08/25/12), hypothyroidism, hiatal hernia, OSA (diagnosed > 10 years ago, but no CPAP following > 50 lb weight loss), anxiety, depression, AAA, carotid artery stenosis, back surgery, focal segmental glomerulosclerosis (s/p prednisone ~ 2004), CKD.   I evaluated [name] during his PAT visit due to reports of intermittent episodes of dizziness with SOB for the past 3-4 weeks. His last episode was around 8:30 AM on the day of his PAT visit, but reported feeling better by end of PAT visit. Episodes last about 30-60 minutes. He is just concerned that he will have an episode where it will not get better. He had no signs of recurrent laryngeal cancer at his 11/2018 visit with ENT. No stridor noted. His lung sounds were clear, and he denied cough, fever, sore throat or other sick symptoms. His weight has been stable. He has Lasix as needed for SOB or fluid retention, but has not noticed any edema and has not taken any Lasix. He denied chest pain (had chest pain when he presented for LAD stent in 2014). The dizziness with SOB episodes do not seem to be associated with activity. He says he stays active and is able to garden, fish, mow his lawn (large lawn that requires some push and riding mowing) without chest pain or bringing on his dizziness/SOB. He doesn't feel that anxiety is contributing (doesn't feel  particularly anxious during spells). He denied syncope. He does get intermittent tingling in his hands and feet (bilaterally, not unilaterally) that has also been going on for about the past month--this can occur a couple times a day. (He said he discussed this with Dr. Carlis Abbott, which is mentioned in his 09/11/19 note.) He denied visual changes or other neurologic changes. Ambulating independently with steady gait. He does have chronic headaches/migraines for which he gets injections, but says these have been stable. He denied taking any new medications. He does have ~ 70% RICA stenosis which could possibly contribute to dizziness, but should not cause SOB and imaging did not show significant LICA disease, he he had antegrade vertebral flow (although congenitally diminutive right vertebral artery, but no stenosis in the strongly dominant left vertebral artery). No angina, and EKG is stable. His Creatinine is stable. No obvious CHF symptoms. He has a flat affect, but did not appear in any acute distress. No conversation dyspnea or labored breathing. No cough. No sick symptoms and lung sounds clear, so does not appear to have acute lung infection. Unclear what is causing his episodes. I offered to arrange further evaluation int he urgent care or ED if he desired, but he declined stating symptoms were improved and had been intermittently occurring for 3-4 weeks now. He says Dr. Carlis Abbott had already advised him to go to the ED if he developed persistent tingling in his extremities or other acute neurologic changes. I did recommend follow-up with his PCP prior to  surgery. I contacted Dr. Josefine Class office, but he is not in the office this week. Staff offered an appointment with another provider on 10/04/19; however, patient preferred to wait until Dr. Damita Dunnings is back in the office on 10/08/19. Appointment made for 10/08/19 at 9:00 AM--patient aware of appointment and confirmed he would seek sooner evaluation  (in the ED if symptoms  warrant) should he have progression or new symptoms. I also discussed with anesthesiologist Nolon Nations, MD who agreed with this plan. Staff message sent to Dr. Carlis Abbott regarding above with CC: Dr. Damita Dunnings. At this point, would defer additional testing, if any, to vascular surgeon and/or PCP.  His 2nd Pfizer COVID-19 vaccine was on 05/01/19. He is scheduled for COVID-19 testing on 10/12/19.  He is to continue ASA and Plavix.  ADDENDUM 10/11/19 9:15 AM:  Patient seen by PCP Dr. Damita Dunnings on 10/08/19. BNP and CXR were within normal limits. Per his communication with Mr. Soffer, "Your follow-up lab and chest x-ray are both fine. I do not suspect an ominous process and it should be okay to go ahead with your procedure."   VS: BP 135/74   Pulse 64   Temp 36.6 C (Oral)   Resp 17   Ht 5\' 7"  (1.702 m)   Wt 90.8 kg   SpO2 98%   BMI 31.34 kg/m    PROVIDERS: Tonia Ghent, MD is PCP ([place]; [phone]) Acie Fredrickson, Arnette Norris, MD is cardiologist. Last office visit 11/02/18. No chest pain or SOB at that time. Was walking for exercise. Given bruising history, ASA discontinued and Plavix continued. One year follow-up recommended. Next visit scheduled for 08/30/67.  Jodi Marble, MD is ENT. Last visit 12/12/18. No evidence of cancer recurrence on office flexible laryngoscopy. Continue annual follow-up as he pleases. ([place]) - Corliss Parish, MD is nephrologist. 10/24/18 office note scanned under Media tab. One year follow-up planned.   LABS: Labs reviewed: Acceptable for surgery. Cr 1.67, but consistent with previous results.  (all labs ordered are listed, but only abnormal results are displayed)  Labs Reviewed  COMPREHENSIVE METABOLIC PANEL - Abnormal; Notable for the following components:      Result Value   Creatinine, Ser 1.67 (*)    Alkaline Phosphatase 34 (*)    GFR calc non Af Amer 42 (*)    GFR calc Af Amer 49 (*)    All other components within normal limits   URINALYSIS, ROUTINE W REFLEX MICROSCOPIC - Abnormal; Notable for the following components:   Protein, ur 100 (*)    All other components within normal limits  SURGICAL PCR SCREEN  APTT  CBC  PROTIME-INR  TYPE AND SCREEN     IMAGES: CXR 10/08/19: FINDINGS: Lungs are clear. Heart size and pulmonary vascularity are normal. No adenopathy. There are prior rib fractures on the left, healed. IMPRESSION: Lungs clear.  Cardiac silhouette within normal limits.  CTA Neck 09/07/19: IMPRESSION: 1. ~70% atheromatous narrowing of the proximal right ICA where there is a small plaque ulceration. 2. Irregular/ulcerated proximal left ICA plaque without stenosis. 3. Congenitally diminutive right vertebral artery which is likely flowing from muscular branches. No stenosis in the strongly dominant left vertebral artery.   EKG:  EKG 10/03/19 (repeated given intermittent episodes of dizziness): Sinus bradycardia at 51 bpm Left axis deviation Inferior infarct , age undetermined RSR' or QR pattern in V1 suggests right ventricular conduction delay Abnormal ECG No significant change since last tracing Confirmed by Fransico Him [phone]) on 10/03/2019 2:13:58  PM  EKG 03/08/19:  Sinus bradycardia 55 bpm Left axis deviation Incomplete right bundle branch block Inferior infarct, age undetermined   CV: US Abdominal Aorta 09/11/19: Summary:  Abdominal Aorta: There is evidence of abnormal dilatation of the proximal  and distal Abdominal aorta. There is evidence of abnormal dilation of the  Right Common Iliac artery and Left Common Iliac artery. The largest aortic  diameter remains essentially  unchanged compared to prior exam. Previous diameter measurement was 3.6 cm  obtained on 10/11/18.    US Carotid 08/28/19: Summary:  - Right Carotid: Velocities in the right ICA are consistent with a 60-79%         stenosis. Unable to obtain higher end diastolic velocity  noted on          yesterday's exam.  - Left Carotid: Velocities in the left ICA are consistent with a 1-39%  stenosis.  - Vertebrals: Bilateral vertebral arteries demonstrate antegrade flow. .  - Subclavians: Normal flow hemodynamics were seen in bilateral subclavian        arteries.    Nuclear stress test 11/24/17:  Nuclear stress EF: 58%.  Blood pressure demonstrated a normal response to exercise.  There was no ST segment deviation noted during stress.  Findings consistent with prior myocardial infarction.  This is a low risk study.  The left ventricular ejection fraction is normal (55-65%).   Small inferobasal infarct no ischemia EF preserved 58% Normal ECG response to exercise    Echo 11/17/17: Study Conclusions  - Left ventricle: The cavity size was normal. There was mild  concentric hypertrophy. Systolic function was vigorous. The  estimated ejection fraction was in the range of 65% to 70%. Wall  motion was normal; there were no regional wall motion  abnormalities. Features are consistent with a pseudonormal left  ventricular filling pattern, with concomitant abnormal relaxation  and increased filling pressure (grade 2 diastolic dysfunction).  Doppler parameters are consistent with elevated ventricular  end-diastolic filling pressure.  - Aortic valve: There was no regurgitation.  - Mitral valve: There was no regurgitation.  - Left atrium: The atrium was mildly dilated.  - Right ventricle: The cavity size was normal. Wall thickness was  normal. Systolic function was normal.  - Tricuspid valve: There was trivial regurgitation.  - Pulmonary arteries: Systolic pressure was within the normal  range.  - Inferior vena cava: The vessel was normal in size.  - Pericardium, extracardiac: There was no pericardial effusion.    Cardiac cath 05/28/16:  Widely patent coronary arteries.  Congenitally small LAD system with widely patent proximal. Luminal irregularities  are noted in the mid vessel. Proximal to the large first diagonal there is 40% narrowing. There are multiple areas of 40% narrowing in the mid segment of the large first diagonal.  Large circumflex territory. The large distal obtuse marginal contains ostial 50% narrowing and proximal 50% narrowing.  Dominant normal right coronary with the exception of minimal mid vessel luminal irregularity.  Normal LV function. Normal filling pressures. EF greater than 60%.  RECOMMENDATIONS:  Aggressive risk factor modification including therapy for elevated cholesterol.  Consider other explanations for the patient's very atypical chest pain complaints   Past Medical History:  Diagnosis Date  . AAA (abdominal aortic aneurysm) without rupture ([place]) 02/14/2014   [place] 1/19:  AAA 3 cm  . ALCOHOL ABUSE, HX OF    distant history  . Anxiety    occ panic sx, increased after death of mother  . ANXIETY DEPRESSION 05/10/2007  history of, during difficult relationship  . CAD (coronary artery disease) 08/28/2012   08/25/2012 Successful PTCA/DES x mid LAD // LAD prox 30, mid stent patent; D1 40; LCx ok; OM2 50, 50; RCA prox 20; EF 55-65  // Nuclear stress test 9/19: EF 58, small inferobasal infarct, no ischemia, Low Risk (inf defect ? diaph atten?)   . Cancer ([place]) 03/2010   tumor on larynx/tx radiation  . Carotid artery disease ([place]) 09/12/2017   [place] 6/19:  R 60-79; L 1-39; R vertebral occluded; FU 12 months  . 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 Chronic diastolic CHF (congestive heart failure) ([place]) 12/21/2017   Echo 11/17/2017 - Mild concentric LVH, EF 65-70, normal wall motion, grade 2 diastolic dysfunction, mild LAE, normal RVSF, trivial TR  . CKD (chronic kidney disease)    Nephrologist is Dr. Corliss Parish (10/03/19)  . Dilatation of aorta ([facility]) 02/14/2014  . Diverticulosis   . ESOPHAGEAL STRICTURE 05/10/2007  . GERD 05/10/2007  . Heart  disease   . HIATAL HERNIA 05/10/2007  . History of echocardiogram    Echo 9/19: mild conc LVH, vigorous LVF, EF 65-70, Gr 2 DD, mild LAE, normal RVSF, trivial AI  . History of radiation therapy   . HYPERLIPIDEMIA 05/10/2007  . HYPERTENSION 05/10/2007  . 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 SLEEP APNEA 05/10/2007   lost 100lb-not now  . Upper airway cough syndrome    Per Dr. Melvyn Novas, pulmonary     Past Surgical History:  Procedure Laterality Date  . APPENDECTOMY    . BACK SURGERY  1997   lower back x3  . CARPOMETACARPAL ([facility]) FUSION OF THUMB Left 04/02/2014   Procedure: CARPOMETACARPAL ([place]) FUSION OF THUMB/LEFT THUMB INTERPHALNGEAL JOINT FUSION;  Surgeon: Jolyn Nap, MD;  Location: [facility];  Service: Orthopedics;  Laterality: Left;  . COLONOSCOPY    . CORONARY STENT PLACEMENT     2.75 x 12 mm Promus Premier DES was deployed in the mid LAD. The stent was post-dilated with a 3.0 x 9 mm Brewster balloon - Mid LAD.  Marland Kitchen LARYNGOSCOPY / BRONCHOSCOPY / ESOPHAGOSCOPY  2012   bx  . LEFT HEART CATH AND CORONARY ANGIOGRAPHY N/A 05/28/2016   Procedure: Left Heart Cath and Coronary Angiography;  Surgeon: Belva Crome, MD;  Location: [facility];  Service: Cardiovascular;  Laterality: N/A;  . LEFT HEART CATHETERIZATION WITH CORONARY ANGIOGRAM N/A 08/25/2012   Procedure: LEFT HEART CATHETERIZATION WITH CORONARY ANGIOGRAM;  Surgeon: Burnell Blanks, MD;  Location: [facility] CATH LAB;  Service: Cardiovascular;  Laterality: N/A;  . LEFT HEART CATHETERIZATION WITH CORONARY ANGIOGRAM N/A 11/23/2013   Procedure: LEFT HEART CATHETERIZATION WITH CORONARY ANGIOGRAM;  Surgeon: Jettie Booze, MD;  Location: [facility] CATH LAB;  Service: Cardiovascular;  Laterality: N/A;  . WRIST FRACTURE SURGERY  08/21/10   Right wrist x4    MEDICATIONS: . aspirin EC 81 MG tablet  . ciprofloxacin (CIPRO) 500 MG tablet  . clopidogrel (PLAVIX) 75 MG tablet  . cyanocobalamin (,VITAMIN B-12,) 1000 MCG/ML  injection  . fenofibrate 160 MG tablet  . gabapentin (NEURONTIN) 600 MG tablet  . melatonin 3 MG TABS tablet  . metoprolol tartrate (LOPRESSOR) 25 MG tablet  . metroNIDAZOLE (FLAGYL) 500 MG tablet  . nitroGLYCERIN (NITROSTAT) 0.4 MG SL tablet  . pantoprazole (PROTONIX) 40 MG tablet  . REPATHA SURECLICK 027 MG/ML SOAJ  . rosuvastatin (CRESTOR) 20 MG tablet  . SYNTHROID 100  MCG tablet  . SYRINGE-NEEDLE, DISP, 3 ML 25G X 1" 3 ML MISC  . tamsulosin (FLOMAX) 0.4 MG CAPS capsule   No current facility-administered medications for this encounter.  He is not currently taking Cipro or Flagyl.   Myra Gianotti, PA-C Surgical Short Stay/Anesthesiology [facility] Phone [phone] [facility] Phone [phone] 10/03/2019 6:19 PM

## 2019-10-03 NOTE — Telephone Encounter (Signed)
Ebony Hail PA at [facility] left a voicemail stating that she is on hold scheduling the patient to see Dr. Damita Dunnings. Ebony Hail stated that the patient is having a pre-op visit today. Ebony Hail stated that patient is scheduled for a carotid stent with Dr. Carlis Abbott on 10/15/19. Ebony Hail stated that patient complained of SOB and intermittent dizziness for about 3-4 weeks. Ebony Hail stated that patient states that this happens everyday for about 30 minutes. Ebony Hail stated that patient's lungs are clear, EKG is stable, heart rate 50/60 and no chest pain. Ebony Hail stated that they just want to make sure that they are not missing anything. Patient scheduled to see Dr. Damita Dunnings 10/08/19 at 9:00 am.

## 2019-10-03 NOTE — Progress Notes (Signed)
PCP - Elsie Stain @ [facility] Cardiologist - [place]   Chest x-ray - 04/10/19  EKG - 10/03/19 Stress Test - 11/04/17 ECHO - 11/17/17 Cardiac Cath - 04/3016  Sleep Study - dx. Yrs. Ago, not on a CPAP, weight loss 50 LBS CPAP - na  Fasting Blood Sugar - na   Blood Thinner Instructions:continue plavix per Caryl Pina @ Dr.Clark's  office Aspirin Instructions: continue per [place] @ Dr. Ainsley Spinner office  ERAS Protcol -na   COVID TEST- 10/12/19   Anesthesia review: cardiac hx. Pt. C/o sob more today, noticed it for past month. States he can be sitting and get it.  Patient denies , fever, cough and chest pain at PAT appointment   All instructions explained to the patient, with a verbal understanding of the material. Patient agrees to go over the instructions while at home for a better understanding. Patient also instructed to self quarantine after being tested for COVID-19. The opportunity to ask questions was provided.

## 2019-10-03 NOTE — Telephone Encounter (Signed)
Noted.  If progressive sx in the meantime, then needs eval sooner.  Thanks.

## 2019-10-04 NOTE — Telephone Encounter (Signed)
Contacted pt who reports he did not say he had SOB and dizziness daily. He said it happens once in a while, such as 2-3 times per week. He said most of the time it occurs with exertion but can happen at other times. He believes this is why he is having this procedure. Advised if any symptom worsens or he has any new symptoms to contact this office or go to the ER. Pt verbalized understanding.

## 2019-10-08 ENCOUNTER — Encounter: Payer: Self-pay | Admitting: Family Medicine

## 2019-10-08 ENCOUNTER — Ambulatory Visit (INDEPENDENT_AMBULATORY_CARE_PROVIDER_SITE_OTHER): Payer: Medicare Other | Admitting: Family Medicine

## 2019-10-08 ENCOUNTER — Ambulatory Visit (INDEPENDENT_AMBULATORY_CARE_PROVIDER_SITE_OTHER)
Admission: RE | Admit: 2019-10-08 | Discharge: 2019-10-08 | Disposition: A | Discharge status: Home / Self Care | Payer: Medicare Other | Source: Ambulatory Visit | Attending: Family Medicine | Admitting: Family Medicine

## 2019-10-08 ENCOUNTER — Other Ambulatory Visit: Payer: Self-pay

## 2019-10-08 VITALS — BP 142/78 | HR 59 | Temp 97.3°F | Ht 67.0 in | Wt 198.2 lb

## 2019-10-08 DIAGNOSIS — I6523 Occlusion and stenosis of bilateral carotid arteries: Secondary | ICD-10-CM

## 2019-10-08 DIAGNOSIS — R0602 Shortness of breath: Secondary | ICD-10-CM

## 2019-10-08 LAB — BRAIN NATRIURETIC PEPTIDE: Pro B Natriuretic peptide (BNP): 47 pg/mL (ref 0.0–100.0)

## 2019-10-08 NOTE — Progress Notes (Signed)
This visit occurred during the SARS-CoV-2 public health emergency.  Safety protocols were in place, including screening questions prior to the visit, additional usage of staff PPE, and extensive cleaning of exam room while observing appropriate contact time as indicated for disinfecting solutions.  Preop eval for R carotid intervention.  He has episodic sx, SOB.  "Going on since I hit 200 lbs."  He also can feel a little panicked in a mask.  He isn't lightheaded on standing.  No CP.  He could walk "as far as I wanted to" if out of clinic and not wearing a mask.  No BLE edema.  He doesn't have vertigo sx.    He cut out sodas recently with dec in weight.  No recent med changes.  No exertional chest pain.    Recent labs and EKG were unremarkable.    Meds, vitals, and allergies reviewed.   ROS: Per HPI unless specifically indicated in ROS section   GEN: nad, alert and oriented HEENT: ncat NECK: supple w/o LA, no stridor CV: rrr. PULM: ctab, no inc wob ABD: soft, +bs EXT: no edema SKIN: no acute rash

## 2019-10-08 NOTE — Patient Instructions (Signed)
Don't change your meds for now. Go to the lab on the way out.   If you have mychart we'll likely use that to update you.    Take care.  Glad to see you. 

## 2019-10-11 DIAGNOSIS — R0602 Shortness of breath: Secondary | ICD-10-CM | POA: Insufficient documentation

## 2019-10-11 NOTE — Assessment & Plan Note (Signed)
Episodic without exertional chest pain.  He has good exercise tolerance where he is able to walk distance without troubles.  No lower extremity edema and normal work-up so far.  Noted since he had previous weight gain.  Also occasionally noted that he can feel a little panicked when wearing a mask.  He may have bendopathy when leaning forward, exacerbated by weight gain.  Discussed options.  Will check BNP and chest x-ray given his normal exam and absence of symptoms at this point.  If those are negative then I suspect he would be fine to go ahead with his procedure.  See notes on imaging and labs.  I will update anesthesia.  I appreciate the help of all involved.

## 2019-10-12 ENCOUNTER — Other Ambulatory Visit (HOSPITAL_COMMUNITY)
Admission: RE | Admit: 2019-10-12 | Discharge: 2019-10-12 | Disposition: A | Discharge status: Home / Self Care | Payer: Medicare Other | Source: Ambulatory Visit | Attending: Vascular Surgery | Admitting: Vascular Surgery

## 2019-10-12 DIAGNOSIS — Z20822 Contact with and (suspected) exposure to covid-19: Secondary | ICD-10-CM | POA: Insufficient documentation

## 2019-10-12 DIAGNOSIS — Z01812 Encounter for preprocedural laboratory examination: Secondary | ICD-10-CM | POA: Insufficient documentation

## 2019-10-12 LAB — SARS CORONAVIRUS 2 (TAT 6-24 HRS): SARS Coronavirus 2: NEGATIVE

## 2019-10-15 ENCOUNTER — Inpatient Hospital Stay (HOSPITAL_COMMUNITY): Payer: Medicare Other

## 2019-10-15 ENCOUNTER — Encounter (HOSPITAL_COMMUNITY)
Admission: RE | Disposition: A | Discharge status: Home / Self Care | Payer: Self-pay | Source: Home / Self Care | Attending: Vascular Surgery

## 2019-10-15 ENCOUNTER — Inpatient Hospital Stay (HOSPITAL_COMMUNITY)
Admission: RE | Admit: 2019-10-15 | Discharge: 2019-10-16 | DRG: 035 | Disposition: A | Discharge status: Home / Self Care | Payer: Medicare Other | Attending: Vascular Surgery | Admitting: Vascular Surgery

## 2019-10-15 ENCOUNTER — Encounter (HOSPITAL_COMMUNITY): Payer: Self-pay | Admitting: Vascular Surgery

## 2019-10-15 ENCOUNTER — Inpatient Hospital Stay (HOSPITAL_COMMUNITY): Payer: Medicare Other | Admitting: Vascular Surgery

## 2019-10-15 ENCOUNTER — Other Ambulatory Visit: Payer: Self-pay

## 2019-10-15 DIAGNOSIS — I5032 Chronic diastolic (congestive) heart failure: Secondary | ICD-10-CM | POA: Diagnosis not present

## 2019-10-15 DIAGNOSIS — Z981 Arthrodesis status: Secondary | ICD-10-CM | POA: Diagnosis not present

## 2019-10-15 DIAGNOSIS — K8020 Calculus of gallbladder without cholecystitis without obstruction: Secondary | ICD-10-CM | POA: Diagnosis not present

## 2019-10-15 DIAGNOSIS — Z8371 Family history of colonic polyps: Secondary | ICD-10-CM | POA: Diagnosis not present

## 2019-10-15 DIAGNOSIS — Z83438 Family history of other disorder of lipoprotein metabolism and other lipidemia: Secondary | ICD-10-CM | POA: Diagnosis not present

## 2019-10-15 DIAGNOSIS — Z7902 Long term (current) use of antithrombotics/antiplatelets: Secondary | ICD-10-CM

## 2019-10-15 DIAGNOSIS — Z20822 Contact with and (suspected) exposure to covid-19: Secondary | ICD-10-CM | POA: Diagnosis not present

## 2019-10-15 DIAGNOSIS — I2510 Atherosclerotic heart disease of native coronary artery without angina pectoris: Secondary | ICD-10-CM | POA: Diagnosis present

## 2019-10-15 DIAGNOSIS — E785 Hyperlipidemia, unspecified: Secondary | ICD-10-CM | POA: Diagnosis present

## 2019-10-15 DIAGNOSIS — Z79899 Other long term (current) drug therapy: Secondary | ICD-10-CM | POA: Diagnosis not present

## 2019-10-15 DIAGNOSIS — Z87891 Personal history of nicotine dependence: Secondary | ICD-10-CM

## 2019-10-15 DIAGNOSIS — Z923 Personal history of irradiation: Secondary | ICD-10-CM | POA: Diagnosis not present

## 2019-10-15 DIAGNOSIS — Z888 Allergy status to other drugs, medicaments and biological substances status: Secondary | ICD-10-CM | POA: Diagnosis not present

## 2019-10-15 DIAGNOSIS — Z885 Allergy status to narcotic agent status: Secondary | ICD-10-CM | POA: Diagnosis not present

## 2019-10-15 DIAGNOSIS — K219 Gastro-esophageal reflux disease without esophagitis: Secondary | ICD-10-CM | POA: Diagnosis present

## 2019-10-15 DIAGNOSIS — H906 Mixed conductive and sensorineural hearing loss, bilateral: Secondary | ICD-10-CM | POA: Diagnosis not present

## 2019-10-15 DIAGNOSIS — Z8249 Family history of ischemic heart disease and other diseases of the circulatory system: Secondary | ICD-10-CM

## 2019-10-15 DIAGNOSIS — I6521 Occlusion and stenosis of right carotid artery: Principal | ICD-10-CM | POA: Diagnosis present

## 2019-10-15 DIAGNOSIS — Z8521 Personal history of malignant neoplasm of larynx: Secondary | ICD-10-CM

## 2019-10-15 DIAGNOSIS — Z808 Family history of malignant neoplasm of other organs or systems: Secondary | ICD-10-CM

## 2019-10-15 DIAGNOSIS — Z9889 Other specified postprocedural states: Secondary | ICD-10-CM

## 2019-10-15 DIAGNOSIS — I110 Hypertensive heart disease with heart failure: Secondary | ICD-10-CM | POA: Diagnosis not present

## 2019-10-15 DIAGNOSIS — I739 Peripheral vascular disease, unspecified: Secondary | ICD-10-CM | POA: Diagnosis present

## 2019-10-15 DIAGNOSIS — E039 Hypothyroidism, unspecified: Secondary | ICD-10-CM | POA: Diagnosis present

## 2019-10-15 DIAGNOSIS — Z79890 Hormone replacement therapy (postmenopausal): Secondary | ICD-10-CM | POA: Diagnosis not present

## 2019-10-15 DIAGNOSIS — Z955 Presence of coronary angioplasty implant and graft: Secondary | ICD-10-CM | POA: Diagnosis not present

## 2019-10-15 DIAGNOSIS — I714 Abdominal aortic aneurysm, without rupture: Secondary | ICD-10-CM | POA: Diagnosis not present

## 2019-10-15 DIAGNOSIS — Z8679 Personal history of other diseases of the circulatory system: Secondary | ICD-10-CM

## 2019-10-15 DIAGNOSIS — G4730 Sleep apnea, unspecified: Secondary | ICD-10-CM | POA: Diagnosis not present

## 2019-10-15 HISTORY — PX: TRANSCAROTID ARTERY REVASCULARIZATIONÂ: SHX6778

## 2019-10-15 HISTORY — PX: ULTRASOUND GUIDANCE FOR VASCULAR ACCESS: SHX6516

## 2019-10-15 LAB — CBC
HCT: 41 % (ref 39.0–52.0)
Hemoglobin: 13.7 g/dL (ref 13.0–17.0)
MCH: 30.9 pg (ref 26.0–34.0)
MCHC: 33.4 g/dL (ref 30.0–36.0)
MCV: 92.3 fL (ref 80.0–100.0)
Platelets: 229 10*3/uL (ref 150–400)
RBC: 4.44 MIL/uL (ref 4.22–5.81)
RDW: 12.6 % (ref 11.5–15.5)
WBC: 5.5 10*3/uL (ref 4.0–10.5)
nRBC: 0 % (ref 0.0–0.2)

## 2019-10-15 LAB — CREATININE, SERUM
Creatinine, Ser: 1.74 mg/dL — ABNORMAL HIGH (ref 0.61–1.24)
GFR calc Af Amer: 47 mL/min — ABNORMAL LOW (ref >60)
GFR calc non Af Amer: 40 mL/min — ABNORMAL LOW (ref >60)

## 2019-10-15 LAB — POCT ACTIVATED CLOTTING TIME: Activated Clotting Time: 246 seconds

## 2019-10-15 SURGERY — TRANSCAROTID ARTERY REVASCULARIZATION (TCAR)
Anesthesia: General | Site: Neck | Laterality: Right

## 2019-10-15 MED ORDER — DEXAMETHASONE SODIUM PHOSPHATE 10 MG/ML IJ SOLN
INTRAMUSCULAR | Status: DC | PRN
  Administered 2019-10-15: 5 mg via INTRAVENOUS

## 2019-10-15 MED ORDER — LACTATED RINGERS IV SOLN
INTRAVENOUS | Status: DC | PRN
Start: 2019-10-15 — End: 2019-10-15

## 2019-10-15 MED ORDER — ASPIRIN EC 81 MG PO TBEC
81.0000 mg | DELAYED_RELEASE_TABLET | Freq: Every day | ORAL | Status: DC
  Administered 2019-10-16: 81 mg via ORAL
  Filled 2019-10-15: qty 1

## 2019-10-15 MED ORDER — SENNOSIDES-DOCUSATE SODIUM 8.6-50 MG PO TABS: 1.0000 | ORAL_TABLET | Freq: Every evening | ORAL | Status: DC | PRN

## 2019-10-15 MED ORDER — SODIUM CHLORIDE 0.9 % IV SOLN
INTRAVENOUS | Status: AC
  Filled 2019-10-15: qty 1.2

## 2019-10-15 MED ORDER — ONDANSETRON HCL 4 MG/2ML IJ SOLN
INTRAMUSCULAR | Status: DC | PRN
  Administered 2019-10-15: 4 mg via INTRAVENOUS

## 2019-10-15 MED ORDER — DOCUSATE SODIUM 100 MG PO CAPS
100.0000 mg | ORAL_CAPSULE | Freq: Every day | ORAL | Status: DC
  Administered 2019-10-16: 100 mg via ORAL
  Filled 2019-10-15: qty 1

## 2019-10-15 MED ORDER — CLOPIDOGREL BISULFATE 75 MG PO TABS
75.0000 mg | ORAL_TABLET | Freq: Every day | ORAL | Status: DC
  Administered 2019-10-16: 75 mg via ORAL
  Filled 2019-10-15: qty 1

## 2019-10-15 MED ORDER — HYDRALAZINE HCL 20 MG/ML IJ SOLN: 5.0000 mg | INTRAMUSCULAR | Status: DC | PRN

## 2019-10-15 MED ORDER — ROSUVASTATIN CALCIUM 20 MG PO TABS
20.0000 mg | ORAL_TABLET | Freq: Every day | ORAL | Status: DC
  Administered 2019-10-15: 20 mg via ORAL
  Filled 2019-10-15: qty 1

## 2019-10-15 MED ORDER — GABAPENTIN 600 MG PO TABS
600.0000 mg | ORAL_TABLET | Freq: Every day | ORAL | Status: DC
  Administered 2019-10-15: 600 mg via ORAL
  Filled 2019-10-15: qty 1

## 2019-10-15 MED ORDER — FENTANYL CITRATE (PF) 250 MCG/5ML IJ SOLN
INTRAMUSCULAR | Status: DC | PRN
  Administered 2019-10-15 (×3): 50 ug via INTRAVENOUS

## 2019-10-15 MED ORDER — DEXAMETHASONE SODIUM PHOSPHATE 10 MG/ML IJ SOLN
INTRAMUSCULAR | Status: AC
  Filled 2019-10-15: qty 1

## 2019-10-15 MED ORDER — HYDROMORPHONE HCL 1 MG/ML IJ SOLN: 0.2500 mg | INTRAMUSCULAR | Status: DC | PRN

## 2019-10-15 MED ORDER — FENOFIBRATE 160 MG PO TABS
160.0000 mg | ORAL_TABLET | Freq: Every day | ORAL | Status: DC
  Administered 2019-10-15 – 2019-10-16 (×2): 160 mg via ORAL
  Filled 2019-10-15 (×2): qty 1

## 2019-10-15 MED ORDER — TAMSULOSIN HCL 0.4 MG PO CAPS
0.4000 mg | ORAL_CAPSULE | Freq: Every day | ORAL | Status: DC
  Administered 2019-10-16: 0.4 mg via ORAL
  Filled 2019-10-15: qty 1

## 2019-10-15 MED ORDER — MAGNESIUM SULFATE 2 GM/50ML IV SOLN: 2.0000 g | Freq: Every day | INTRAVENOUS | Status: DC | PRN

## 2019-10-15 MED ORDER — SODIUM CHLORIDE 0.9 % IV SOLN: 500.0000 mL | Freq: Once | INTRAVENOUS | Status: DC | PRN

## 2019-10-15 MED ORDER — CHLORHEXIDINE GLUCONATE CLOTH 2 % EX PADS: 6.0000 | MEDICATED_PAD | Freq: Once | CUTANEOUS | Status: DC

## 2019-10-15 MED ORDER — BISACODYL 5 MG PO TBEC: 5.0000 mg | DELAYED_RELEASE_TABLET | Freq: Every day | ORAL | Status: DC | PRN

## 2019-10-15 MED ORDER — SODIUM CHLORIDE 0.9 % IV SOLN: INTRAVENOUS | Status: DC

## 2019-10-15 MED ORDER — HEPARIN SODIUM (PORCINE) 1000 UNIT/ML IJ SOLN
INTRAMUSCULAR | Status: DC | PRN
Start: 2019-10-15 — End: 2019-10-15
  Administered 2019-10-15: 9000 [IU] via INTRAVENOUS
  Administered 2019-10-15: 1000 [IU] via INTRAVENOUS

## 2019-10-15 MED ORDER — HEPARIN SODIUM (PORCINE) 5000 UNIT/ML IJ SOLN: 5000.0000 [IU] | Freq: Three times a day (TID) | INTRAMUSCULAR | Status: DC

## 2019-10-15 MED ORDER — ORAL CARE MOUTH RINSE: 15.0000 mL | Freq: Once | OROMUCOSAL | Status: AC

## 2019-10-15 MED ORDER — EPHEDRINE SULFATE 50 MG/ML IJ SOLN
INTRAMUSCULAR | Status: DC | PRN
  Administered 2019-10-15: 5 mg via INTRAVENOUS
  Administered 2019-10-15 (×2): 10 mg via INTRAVENOUS

## 2019-10-15 MED ORDER — ALUM & MAG HYDROXIDE-SIMETH 200-200-20 MG/5ML PO SUSP: 15.0000 mL | ORAL | Status: DC | PRN

## 2019-10-15 MED ORDER — LABETALOL HCL 5 MG/ML IV SOLN: 10.0000 mg | INTRAVENOUS | Status: DC | PRN

## 2019-10-15 MED ORDER — NITROGLYCERIN 0.4 MG SL SUBL: 0.4000 mg | SUBLINGUAL_TABLET | SUBLINGUAL | Status: DC | PRN

## 2019-10-15 MED ORDER — MIDAZOLAM HCL 2 MG/2ML IJ SOLN
INTRAMUSCULAR | Status: DC | PRN
  Administered 2019-10-15: 1 mg via INTRAVENOUS

## 2019-10-15 MED ORDER — ACETAMINOPHEN 650 MG RE SUPP: 325.0000 mg | RECTAL | Status: DC | PRN

## 2019-10-15 MED ORDER — PROMETHAZINE HCL 25 MG/ML IJ SOLN: 6.2500 mg | INTRAMUSCULAR | Status: DC | PRN

## 2019-10-15 MED ORDER — CHLORHEXIDINE GLUCONATE 0.12 % MT SOLN: 15.0000 mL | Freq: Once | OROMUCOSAL | Status: AC

## 2019-10-15 MED ORDER — ROCURONIUM BROMIDE 10 MG/ML (PF) SYRINGE
PREFILLED_SYRINGE | INTRAVENOUS | Status: DC | PRN
  Administered 2019-10-15: 70 mg via INTRAVENOUS

## 2019-10-15 MED ORDER — PHENYLEPHRINE 40 MCG/ML (10ML) SYRINGE FOR IV PUSH (FOR BLOOD PRESSURE SUPPORT)
PREFILLED_SYRINGE | INTRAVENOUS | Status: AC
  Filled 2019-10-15: qty 10

## 2019-10-15 MED ORDER — TRAMADOL HCL 50 MG PO TABS: 50.0000 mg | ORAL_TABLET | Freq: Four times a day (QID) | ORAL | Status: DC | PRN

## 2019-10-15 MED ORDER — GUAIFENESIN-DM 100-10 MG/5ML PO SYRP: 15.0000 mL | ORAL_SOLUTION | ORAL | Status: DC | PRN

## 2019-10-15 MED ORDER — PHENYLEPHRINE HCL-NACL 10-0.9 MG/250ML-% IV SOLN
INTRAVENOUS | Status: DC | PRN
Start: 2019-10-15 — End: 2019-10-15
  Administered 2019-10-15: 25 ug/min via INTRAVENOUS

## 2019-10-15 MED ORDER — ESMOLOL HCL 100 MG/10ML IV SOLN
INTRAVENOUS | Status: DC | PRN
  Administered 2019-10-15: 20 mg via INTRAVENOUS
  Administered 2019-10-15 (×2): 40 mg via INTRAVENOUS

## 2019-10-15 MED ORDER — HYDROMORPHONE HCL 1 MG/ML IJ SOLN: 0.5000 mg | INTRAMUSCULAR | Status: DC | PRN

## 2019-10-15 MED ORDER — PANTOPRAZOLE SODIUM 40 MG PO TBEC
40.0000 mg | DELAYED_RELEASE_TABLET | Freq: Every day | ORAL | Status: DC
  Administered 2019-10-16: 40 mg via ORAL
  Filled 2019-10-15: qty 1

## 2019-10-15 MED ORDER — LEVOTHYROXINE SODIUM 100 MCG PO TABS
100.0000 ug | ORAL_TABLET | Freq: Every day | ORAL | Status: DC
  Administered 2019-10-16: 100 ug via ORAL
  Filled 2019-10-15: qty 1

## 2019-10-15 MED ORDER — MIDAZOLAM HCL 2 MG/2ML IJ SOLN
INTRAMUSCULAR | Status: AC
  Filled 2019-10-15: qty 2

## 2019-10-15 MED ORDER — LIDOCAINE 2% (20 MG/ML) 5 ML SYRINGE
INTRAMUSCULAR | Status: AC
  Filled 2019-10-15: qty 5

## 2019-10-15 MED ORDER — PROTAMINE SULFATE 10 MG/ML IV SOLN
INTRAVENOUS | Status: DC | PRN
  Administered 2019-10-15: 10 mg via INTRAVENOUS
  Administered 2019-10-15: 20 mg via INTRAVENOUS

## 2019-10-15 MED ORDER — SODIUM CHLORIDE 0.9 % IV SOLN
INTRAVENOUS | Status: DC | PRN
  Administered 2019-10-15: 11:00:00 500 mL

## 2019-10-15 MED ORDER — CEFAZOLIN SODIUM-DEXTROSE 2-4 GM/100ML-% IV SOLN
2.0000 g | INTRAVENOUS | Status: AC
  Administered 2019-10-15: 2 g via INTRAVENOUS

## 2019-10-15 MED ORDER — PROPOFOL 10 MG/ML IV BOLUS
INTRAVENOUS | Status: DC | PRN
  Administered 2019-10-15: 120 mg via INTRAVENOUS

## 2019-10-15 MED ORDER — HEMOSTATIC AGENTS (NO CHARGE) OPTIME
TOPICAL | Status: DC | PRN
  Administered 2019-10-15: 1 via TOPICAL

## 2019-10-15 MED ORDER — PROPOFOL 10 MG/ML IV BOLUS
INTRAVENOUS | Status: AC
  Filled 2019-10-15: qty 20

## 2019-10-15 MED ORDER — HYDROCORTISONE NA SUCCINATE PF 100 MG IJ SOLR
INTRAMUSCULAR | Status: DC | PRN
Start: 2019-10-15 — End: 2019-10-15
  Administered 2019-10-15: 125 mg via INTRAVENOUS

## 2019-10-15 MED ORDER — FENTANYL CITRATE (PF) 250 MCG/5ML IJ SOLN
INTRAMUSCULAR | Status: AC
  Filled 2019-10-15: qty 5

## 2019-10-15 MED ORDER — PROTAMINE SULFATE 10 MG/ML IV SOLN
INTRAVENOUS | Status: AC
  Filled 2019-10-15: qty 25

## 2019-10-15 MED ORDER — LIDOCAINE 2% (20 MG/ML) 5 ML SYRINGE
INTRAMUSCULAR | Status: DC | PRN
  Administered 2019-10-15: 100 mg via INTRAVENOUS

## 2019-10-15 MED ORDER — SUGAMMADEX SODIUM 200 MG/2ML IV SOLN
INTRAVENOUS | Status: DC | PRN
  Administered 2019-10-15: 200 mg via INTRAVENOUS

## 2019-10-15 MED ORDER — ACETAMINOPHEN 325 MG PO TABS
325.0000 mg | ORAL_TABLET | ORAL | Status: DC | PRN
  Administered 2019-10-15: 650 mg via ORAL
  Filled 2019-10-15: qty 2

## 2019-10-15 MED ORDER — PHENOL 1.4 % MT LIQD: 1.0000 | OROMUCOSAL | Status: DC | PRN

## 2019-10-15 MED ORDER — POTASSIUM CHLORIDE CRYS ER 20 MEQ PO TBCR: 20.0000 meq | EXTENDED_RELEASE_TABLET | Freq: Every day | ORAL | Status: DC | PRN

## 2019-10-15 MED ORDER — CEFAZOLIN SODIUM-DEXTROSE 2-4 GM/100ML-% IV SOLN
INTRAVENOUS | Status: AC
  Filled 2019-10-15: qty 100

## 2019-10-15 MED ORDER — HEPARIN SODIUM (PORCINE) 1000 UNIT/ML IJ SOLN
INTRAMUSCULAR | Status: AC
  Filled 2019-10-15: qty 1

## 2019-10-15 MED ORDER — 0.9 % SODIUM CHLORIDE (POUR BTL) OPTIME
TOPICAL | Status: DC | PRN
  Administered 2019-10-15: 1000 mL

## 2019-10-15 MED ORDER — LACTATED RINGERS IV SOLN: INTRAVENOUS | Status: DC

## 2019-10-15 MED ORDER — ONDANSETRON HCL 4 MG/2ML IJ SOLN: 4.0000 mg | Freq: Four times a day (QID) | INTRAMUSCULAR | Status: DC | PRN

## 2019-10-15 MED ORDER — CEFAZOLIN SODIUM-DEXTROSE 2-4 GM/100ML-% IV SOLN
2.0000 g | Freq: Three times a day (TID) | INTRAVENOUS | Status: AC
  Administered 2019-10-15 – 2019-10-16 (×2): 2 g via INTRAVENOUS
  Filled 2019-10-15 (×2): qty 100

## 2019-10-15 MED ORDER — METOPROLOL TARTRATE 5 MG/5ML IV SOLN: 2.0000 mg | INTRAVENOUS | Status: DC | PRN

## 2019-10-15 MED ORDER — CHLORHEXIDINE GLUCONATE 0.12 % MT SOLN
OROMUCOSAL | Status: AC
  Administered 2019-10-15: 15 mL via OROMUCOSAL
  Filled 2019-10-15: qty 15

## 2019-10-15 MED ORDER — MELATONIN 3 MG PO TABS
3.0000 mg | ORAL_TABLET | Freq: Every evening | ORAL | Status: DC | PRN
  Administered 2019-10-15: 3 mg via ORAL
  Filled 2019-10-15: qty 1

## 2019-10-15 MED ORDER — DIPHENHYDRAMINE HCL 50 MG/ML IJ SOLN
INTRAMUSCULAR | Status: DC | PRN
Start: 2019-10-15 — End: 2019-10-15
  Administered 2019-10-15: 25 mg via INTRAVENOUS

## 2019-10-15 MED ORDER — IODIXANOL 320 MG/ML IV SOLN
INTRAVENOUS | Status: DC | PRN
  Administered 2019-10-15: 50 mL via INTRA_ARTERIAL

## 2019-10-15 MED ORDER — GLYCOPYRROLATE PF 0.2 MG/ML IJ SOSY
PREFILLED_SYRINGE | INTRAMUSCULAR | Status: AC
  Filled 2019-10-15: qty 1

## 2019-10-15 MED ORDER — GLYCOPYRROLATE 0.2 MG/ML IJ SOLN
INTRAMUSCULAR | Status: DC | PRN
Start: 2019-10-15 — End: 2019-10-15
  Administered 2019-10-15: .2 mg via INTRAVENOUS

## 2019-10-15 MED ORDER — METOPROLOL TARTRATE 12.5 MG HALF TABLET
12.5000 mg | ORAL_TABLET | Freq: Two times a day (BID) | ORAL | Status: DC
  Administered 2019-10-15 – 2019-10-16 (×2): 12.5 mg via ORAL
  Filled 2019-10-15 (×2): qty 1

## 2019-10-15 MED ORDER — ONDANSETRON HCL 4 MG/2ML IJ SOLN
INTRAMUSCULAR | Status: AC
  Filled 2019-10-15: qty 2

## 2019-10-15 MED ORDER — PHENYLEPHRINE HCL (PRESSORS) 10 MG/ML IV SOLN
INTRAVENOUS | Status: DC | PRN
  Administered 2019-10-15 (×2): 80 ug via INTRAVENOUS

## 2019-10-15 SURGICAL SUPPLY — 73 items
ADH SKN CLS APL DERMABOND .7 (GAUZE/BANDAGES/DRESSINGS) ×2
ADPR TBG 2 MALE LL ART (MISCELLANEOUS)
BAG BANDED W/RUBBER/TAPE 36X54 (MISCELLANEOUS) ×4 IMPLANT
BAG EQP BAND 135X91 W/RBR TAPE (MISCELLANEOUS) ×2
BALLN STERLING RX 5X40X80 (BALLOONS) ×4
BALLOON STERLING RX 5X40X80 (BALLOONS) ×2 IMPLANT
CANISTER SUCT 3000ML PPV (MISCELLANEOUS) ×4 IMPLANT
CATH BEACON 5 .035 40 KMP TP (CATHETERS) ×2 IMPLANT
CATH BEACON 5 .038 40 KMP TP (CATHETERS) ×4
CATH ROBINSON RED A/P 18FR (CATHETERS) IMPLANT
CLIP VESOCCLUDE MED 6/CT (CLIP) ×4 IMPLANT
CLIP VESOCCLUDE SM WIDE 6/CT (CLIP) ×4 IMPLANT
COVER DOME SNAP 22 D (MISCELLANEOUS) ×4 IMPLANT
COVER PROBE W GEL 5X96 (DRAPES) ×4 IMPLANT
COVER WAND RF STERILE (DRAPES) ×4 IMPLANT
DERMABOND ADVANCED (GAUZE/BANDAGES/DRESSINGS) ×2
DERMABOND ADVANCED .7 DNX12 (GAUZE/BANDAGES/DRESSINGS) ×2 IMPLANT
DRAIN CHANNEL 15F RND FF W/TCR (WOUND CARE) IMPLANT
DRAPE FEMORAL ANGIO 80X135IN (DRAPES) ×4 IMPLANT
DRAPE INCISE IOBAN 66X45 STRL (DRAPES) ×4 IMPLANT
ELECT REM PT RETURN 9FT ADLT (ELECTROSURGICAL) ×4
ELECTRODE REM PT RTRN 9FT ADLT (ELECTROSURGICAL) ×2 IMPLANT
EVACUATOR SILICONE 100CC (DRAIN) IMPLANT
GLOVE BIO SURGEON STRL SZ7.5 (GLOVE) ×4 IMPLANT
GOWN STRL REUS W/ TWL LRG LVL3 (GOWN DISPOSABLE) ×4 IMPLANT
GOWN STRL REUS W/ TWL XL LVL3 (GOWN DISPOSABLE) ×2 IMPLANT
GOWN STRL REUS W/TWL LRG LVL3 (GOWN DISPOSABLE) ×8
GOWN STRL REUS W/TWL XL LVL3 (GOWN DISPOSABLE) ×4
GUIDEWIRE ENROUTE 0.014 (WIRE) ×8 IMPLANT
HEMOSTAT SNOW SURGICEL 2X4 (HEMOSTASIS) ×4 IMPLANT
INSERT FOGARTY SM (MISCELLANEOUS) IMPLANT
INTRODUCER KIT GALT 7CM (INTRODUCER) ×4
IV ADAPTER SYR DOUBLE MALE LL (MISCELLANEOUS) IMPLANT
KIT BASIN OR (CUSTOM PROCEDURE TRAY) ×4 IMPLANT
KIT ENCORE 26 ADVANTAGE (KITS) ×4 IMPLANT
KIT INTRODUCER GALT 7 (INTRODUCER) ×2 IMPLANT
KIT TURNOVER KIT B (KITS) ×4 IMPLANT
NEEDLE HYPO 25GX1X1/2 BEV (NEEDLE) IMPLANT
NEEDLE PERC 18GX7CM (NEEDLE) ×4 IMPLANT
NEEDLE SPNL 20GX3.5 QUINCKE YW (NEEDLE) IMPLANT
PACK CAROTID (CUSTOM PROCEDURE TRAY) ×4 IMPLANT
PACK UNIVERSAL I (CUSTOM PROCEDURE TRAY) ×4 IMPLANT
PAD ARMBOARD 7.5X6 YLW CONV (MISCELLANEOUS) ×8 IMPLANT
POSITIONER HEAD DONUT 9IN (MISCELLANEOUS) ×4 IMPLANT
PROTECTION STATION PRESSURIZED (MISCELLANEOUS)
SET MICROPUNCTURE 5F STIFF (MISCELLANEOUS) ×4 IMPLANT
SHEATH PINNACLE 5FR (SHEATH) IMPLANT
SHUNT CAROTID BYPASS 10 (VASCULAR PRODUCTS) IMPLANT
SHUNT CAROTID BYPASS 12FRX15.5 (VASCULAR PRODUCTS) IMPLANT
STATION PROTECTION PRESSURIZED (MISCELLANEOUS) IMPLANT
STENT TRANSCAROTID SYS 10X40 (Permanent Stent) ×4 IMPLANT
STOPCOCK 4 WAY LG BORE MALE ST (IV SETS) ×4 IMPLANT
SUT ETHILON 3 0 PS 1 (SUTURE) IMPLANT
SUT MNCRL AB 4-0 PS2 18 (SUTURE) ×4 IMPLANT
SUT PROLENE 5 0 C 1 24 (SUTURE) ×4 IMPLANT
SUT PROLENE 6 0 BV (SUTURE) IMPLANT
SUT PROLENE 7 0 BV 1 (SUTURE) IMPLANT
SUT SILK 2 0 PERMA HAND 18 BK (SUTURE) ×8 IMPLANT
SUT SILK 2 0 SH CR/8 (SUTURE) IMPLANT
SUT SILK 3 0 (SUTURE)
SUT SILK 3-0 18XBRD TIE 12 (SUTURE) IMPLANT
SUT VIC AB 3-0 SH 27 (SUTURE) ×4
SUT VIC AB 3-0 SH 27X BRD (SUTURE) ×2 IMPLANT
SYR 10ML LL (SYRINGE) ×12 IMPLANT
SYR 20ML LL LF (SYRINGE) ×4 IMPLANT
SYR 5ML LL (SYRINGE) ×4 IMPLANT
SYR CONTROL 10ML LL (SYRINGE) IMPLANT
TOWEL GREEN STERILE (TOWEL DISPOSABLE) ×4 IMPLANT
TUBING ART PRESS 48 MALE/FEM (TUBING) IMPLANT
TUBING EXTENTION W/L.L. (IV SETS) IMPLANT
WATER STERILE IRR 1000ML POUR (IV SOLUTION) ×4 IMPLANT
WIRE AMPLATZ SS-J .035X180CM (WIRE) IMPLANT
WIRE BENTSON .035X145CM (WIRE) ×4 IMPLANT

## 2019-10-15 NOTE — H&P (Signed)
History and Physical Interval Note:  10/15/2019 8:06 AM  Tony Hunt  has presented today for surgery, with the diagnosis of RIGHT CAROTID ARTERY STENOSIS.  The various methods of treatment have been discussed with the patient and family. After consideration of risks, benefits and other options for treatment, the patient has consented to  Procedure(s): RIGHT TRANSCAROTID ARTERY REVASCULARIZATION (Right) as a surgical intervention.  The patient's history has been reviewed, patient examined, no change in status, stable for surgery.  I have reviewed the patient's chart and labs.  Questions were answered to the patient's satisfaction.    Right TCAR for asymptomatic high grade stenosis and previous head/neck radiation for laryngeal cancer.  Has been taking aspirin, plavix, statin.  Discussed perioperative stroke risk as well as other risks.    [name]  Patient name: Tony Hunt MRN: [id number]        DOB: [birth year]          Sex: male  REASON FOR CONSULT: Follow-up after CTA neck to evaluate high-grade right ICA stenosis  HPI: Tony Hunt is a 64 y.o. male, with history of abdominal aortic aneurysm, laryngeal cancer status post radiation, coronary disease status post remote PCI, hypertension, hyperlipidemia, diastolic CHF that presents for follow-up after CTA neck to evaluate high-grade right ICA stenosis.  He recently had a duplex at  Dr. Lanny Hurst office suggested a high-grade greater than 80% stenosis in the right ICA with a velocity of 272/115.  He did endorse burning and stinging in both arms from his elbows down into his hands and this has been getting worse over the last 3 to 4 days and this is very similar to burning and stinging he gets in his feet that has been ongoing for 6 months.  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  Previously has been followed by Dr. Trula Slade for  abdominal aortic aneurysm and this last measured 3.6 cm in 2020 on duplex.  He remains on Plavix and Crestor.  On follow-up today no new neurologic symptoms.  Still taking his Plavix and Crestor.      Past Medical History:  Diagnosis Date  . AAA (abdominal aortic aneurysm) without rupture ([place]) 02/14/2014   [place] 1/19:  AAA 3 cm  . ALCOHOL ABUSE, HX OF    distant history  . Anxiety    occ panic sx, increased after death of mother  . ANXIETY DEPRESSION 05/10/2007   history of, during difficult relationship  . CAD (coronary artery disease) 08/28/2012   08/25/2012 Successful PTCA/DES x mid LAD // LAD prox 30, mid stent patent; D1 40; LCx ok; OM2 50, 50; RCA prox 20; EF 55-65  // Nuclear stress test 9/19: EF 58, small inferobasal infarct, no ischemia, Low Risk (inf defect ? diaph atten?)   . Cancer ([place]) 03/2010   tumor on larynx/tx radiation  . Carotid artery disease ([place]) 09/12/2017   [place] 6/19:  R 60-79; L 1-39; R vertebral occluded; FU 12 months  . 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 Chronic diastolic CHF (congestive heart failure) ([place]) 12/21/2017   Echo 11/17/2017 - Mild concentric LVH, EF 65-70, normal wall motion, grade 2 diastolic dysfunction, mild LAE, normal RVSF, trivial TR  . Dilatation of aorta ([facility]) 02/14/2014  . Diverticulosis   . ESOPHAGEAL STRICTURE 05/10/2007  . GERD 05/10/2007  . Heart disease   .  HIATAL HERNIA 05/10/2007  . History of echocardiogram    Echo 9/19: mild conc LVH, vigorous LVF, EF 65-70, Gr 2 DD, mild LAE, normal RVSF, trivial AI  . History of radiation therapy   . HYPERLIPIDEMIA 05/10/2007  . HYPERTENSION 05/10/2007  . 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 SLEEP APNEA 05/10/2007   lost 100lb-not now  . Upper airway cough syndrome    Per Dr. Melvyn Novas, pulmonary          Past Surgical History:  Procedure Laterality Date  . APPENDECTOMY    . BACK SURGERY  1997   lower  back x3  . CARPOMETACARPAL ([facility]) FUSION OF THUMB Left 04/02/2014   Procedure: CARPOMETACARPAL ([place]) FUSION OF THUMB/LEFT THUMB INTERPHALNGEAL JOINT FUSION;  Surgeon: Jolyn Nap, MD;  Location: [facility];  Service: Orthopedics;  Laterality: Left;  . COLONOSCOPY    . CORONARY STENT PLACEMENT     2.75 x 12 mm Promus Premier DES was deployed in the mid LAD. The stent was post-dilated with a 3.0 x 9 mm  balloon - Mid LAD.  Marland Kitchen LARYNGOSCOPY / BRONCHOSCOPY / ESOPHAGOSCOPY  2012   bx  . LEFT HEART CATH AND CORONARY ANGIOGRAPHY N/A 05/28/2016   Procedure: Left Heart Cath and Coronary Angiography;  Surgeon: Belva Crome, MD;  Location: [facility];  Service: Cardiovascular;  Laterality: N/A;  . LEFT HEART CATHETERIZATION WITH CORONARY ANGIOGRAM N/A 08/25/2012   Procedure: LEFT HEART CATHETERIZATION WITH CORONARY ANGIOGRAM;  Surgeon: Burnell Blanks, MD;  Location: [facility] C[facility] LAB;  Service: Cardiovascular;  Laterality: N/A;  . LEFT HEART CATHETERIZATION WITH CORONARY ANGIOGRAM N/A 11/23/2013   Procedure: LEFT HEART CATHETERIZATION WITH CORONARY ANGIOGRAM;  Surgeon: Jettie Booze, MD;  Location: [facility] CATH LAB;  Service: Cardiovascular;  Laterality: N/A;  . WRIST FRACTURE SURGERY  08/21/10   Right wrist x4         Family History  Problem Relation Age of Onset  . Brain cancer Father   . Dementia Father        h/o brain tumor  . Heart disease Mother   . Hypertension Mother   . Heart disease Brother   . Hypertension Brother   . Hyperlipidemia Brother   . Colon polyps Brother   . Colon cancer Neg Hx   . Prostate cancer Neg Hx   . Esophageal cancer Neg Hx   . Pancreatic cancer Neg Hx   . Liver disease Neg Hx   . Throat cancer Neg Hx     SOCIAL HISTORY: Social History        Socioeconomic History  . Marital status: Married    Spouse name: Not on file  . Number of children: 1  . Years of education: Not on file  . Highest  education level: Not on file  Occupational History  . Occupation:      Fish farm manager: UNEMPLOYED    Comment: Retired  Tobacco Use  . Smoking status: Former Smoker    Quit date: 09/30/2010    Years since quitting: 8.9  . Smokeless tobacco: Former Network engineer  . Vaping Use: Never used  Substance and Sexual Activity  . Alcohol use: Yes    Comment: occ   . Drug use: No  . Sexual activity: Yes  Other Topics Concern  . Not on file  Social History Narrative   Married 8/13. 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  Noted tinnitus, h/o noise exposure that was service related.    Social Determinants of Health      Financial Resource Strain: Low Risk   . Difficulty of Paying Living Expenses: Not hard at all  Food Insecurity: No Food Insecurity  . Worried About Charity fundraiser in the Last Year: Never true  . Ran Out of Food in the Last Year: Never true  Transportation Needs: No Transportation Needs  . Lack of Transportation (Medical): No  . Lack of Transportation (Non-Medical): No  Physical Activity: Insufficiently Active  . Days of Exercise per Week: 3 days  . Minutes of Exercise per Session: 40 min  Stress: No Stress Concern Present  . Feeling of Stress : Not at all  Social Connections:   . Frequency of Communication with Friends and Family:   . Frequency of Social Gatherings with Friends and Family:   . Attends Religious Services:   . Active Member of Clubs or Organizations:   . Attends Archivist Meetings:   Marland Kitchen Marital Status:   Intimate Partner Violence: Not At Risk  . Fear of Current or Ex-Partner: No  . Emotionally Abused: No  . Physically Abused: No  . Sexually Abused: No         Allergies  Allergen Reactions  . Imdur [Isosorbide Dinitrate] Other (See Comments)    Headache at 60mg  dose, but able to tolerate 30mg  per day  . Iohexol Anaphylaxis and Other (See Comments)    OK with  13 hour prep  . Lisinopril Other (See Comments)    Elevated cr- improved off med  . Pravastatin Nausea Only and Other (See Comments)    Intense headache  . Codeine Itching  . Iodine-131 Other (See Comments)    Passed out  . Simvastatin Other (See Comments)    Edema  . Statins Other (See Comments)    Sig edema on simvastatin          Current Outpatient Medications  Medication Sig Dispense Refill  . clopidogrel (PLAVIX) 75 MG tablet TAKE 1 TABLET DAILY WITH BREAKFAST 90 tablet 3  . clotrimazole-betamethasone (LOTRISONE) cream Apply 1 application topically 2 (two) times daily. 30 g 0  . cyanocobalamin (,VITAMIN B-12,) 1000 MCG/ML injection Inject 1030mcg monthly. 10 mL 1  . Evolocumab (REPATHA SURECLICK) 161 MG/ML SOAJ Inject 1 pen into the skin every 14 (fourteen) days. 6 pen 3  . fenofibrate 160 MG tablet Take 1 tablet (160 mg total) by mouth daily. 90 tablet 3  . gabapentin (NEURONTIN) 600 MG tablet Take 1 tablet (600 mg total) by mouth at bedtime.    . Melatonin 10 MG TABS Take 10 mg by mouth at bedtime.    . metoprolol tartrate (LOPRESSOR) 25 MG tablet TAKE ONE-HALF (1/2) TABLET TWICE A DAY 90 tablet 3  . metroNIDAZOLE (FLAGYL) 500 MG tablet Take 1 tablet (500 mg total) by mouth 3 (three) times daily. 30 tablet 0  . nitroGLYCERIN (NITROSTAT) 0.4 MG SL tablet Place 1 tablet (0.4 mg total) under the tongue every 5 (five) minutes as needed for chest pain (do not exceed 3 pills during one episode). 25 tablet 6  . pantoprazole (PROTONIX) 40 MG tablet TAKE 1 TABLET DAILY 90 tablet 3  . rosuvastatin (CRESTOR) 20 MG tablet TAKE 1 TABLET DAILY 90 tablet 3  . SYNTHROID 100 MCG tablet TAKE 1 TABLET DAILY BEFORE BREAKFAST 90 tablet 0  . SYRINGE-NEEDLE, DISP, 3 ML 25G X 1" 3 ML MISC Patient  is to use to self inject 1 ml of vitamin B12 per provider instructions. 10 each 1  . tamsulosin (FLOMAX) 0.4 MG CAPS capsule TAKE 1 CAPSULE DAILY 90 capsule 3  . ciprofloxacin (CIPRO) 500 MG  tablet Take 1 tablet (500 mg total) by mouth 2 (two) times daily. (Patient not taking: Reported on 08/28/2019) 20 tablet 0  . gabapentin (NEURONTIN) 100 MG capsule Take 100 mg by mouth as needed (for headache). (Patient not taking: Reported on 09/11/2019)     No current facility-administered medications for this visit.    REVIEW OF SYSTEMS:  [X]  denotes positive finding, [ ]  denotes negative finding Cardiac  Comments:  Chest pain or chest pressure:    Shortness of breath upon exertion:    Short of breath when lying flat:    Irregular heart rhythm:        Vascular    Pain in calf, thigh, or hip brought on by ambulation:    Pain in feet at night that wakes you up from your sleep:     Blood clot in your veins:    Leg swelling:         Pulmonary    Oxygen at home:    Productive cough:     Wheezing:         Neurologic    Sudden weakness in arms or legs:     Sudden numbness in arms or legs:     Sudden onset of difficulty speaking or slurred speech:    Temporary loss of vision in one eye:     Problems with dizziness:         Gastrointestinal    Blood in stool:     Vomited blood:         Genitourinary    Burning when urinating:     Blood in urine:        Psychiatric    Major depression:         Hematologic    Bleeding problems:    Problems with blood clotting too easily:        Skin    Rashes or ulcers:        Constitutional    Fever or chills:      PHYSICAL EXAM: Vitals:   09/11/19 0830  BP: 129/77  Pulse: (!) 47  Resp: 16  Temp: (!) 97.2 F (36.2 C)  TempSrc: Temporal  SpO2: 99%  Weight: 197 lb (89.4 kg)  Height: 5\' 7"  (1.702 m)    GENERAL: The patient is a well-nourished male, in no acute distress. The vital signs are documented above. CARDIAC: There is a regular rate and rhythm.  VASCULAR:  Appreciable carotid pulse bilaterally PULMONARY: There is  good air exchange bilaterally without wheezing or rales. ABDOMEN: Soft and non-tender with normal pitched bowel sounds.  MUSCULOSKELETAL: There are no major deformities or cyanosis. NEUROLOGIC: No focal weakness or paresthesias are detected. SKIN: There are no ulcers or rashes noted. PSYCHIATRIC: The patient has a normal affect.  DATA:   CTA neck on my review shows a high-grade right ICA stenosis approaching 80% with ulceration.  Assessment/Plan:  64 year old male presents for further follow-up after CTA neck to evaluate high-grade stenosis in the right ICA based on his duplex from cardiology.  Discussed with him in detail today after review of CTA would be reasonable to proceed with right carotid intervention given high-grade stenosis approaching 80% and ulceration.  Given previous neck radiation I think TCAR would be an excellent  option for him.  Discussed option for small incision just above the clavicle with sheath placement in the common carotid artery for stent of the ICA lesion with flow reversal.  We discussed stroke risk of about 1.4%.  I will have him start a baby aspirin in addition to his Plavix and statin for planned TCAR.  He want to schedule this at the end of August which I think is reasonable given this is an asymptomatic lesion.  In addition we repeated his AAA duplex today and I think his aneurysm is essentially unchanged and measures 3.61 cm in maximal diameter.  We will follow this up with a yearly AAA duplex which I updated him about.  [name], MD [facility] Office: [phone]

## 2019-10-15 NOTE — Anesthesia Procedure Notes (Signed)
Procedure Name: Intubation Date/Time: 10/15/2019 9:53 AM Performed by: Janace Litten, CRNA Pre-anesthesia Checklist: Patient identified, Emergency Drugs available, Suction available and Patient being monitored Patient Re-evaluated:Patient Re-evaluated prior to induction Oxygen Delivery Method: Circle System Utilized Preoxygenation: Pre-oxygenation with 100% oxygen Induction Type: IV induction Ventilation: Mask ventilation without difficulty and Oral airway inserted - appropriate to patient size Laryngoscope Size: Glidescope and 3 Grade View: Grade I Tube type: Oral Tube size: 7.0 mm Number of attempts: 1 Airway Equipment and Method: Oral airway,  Rigid stylet and Video-laryngoscopy Placement Confirmation: ETT inserted through vocal cords under direct vision,  positive ETCO2 and breath sounds checked- equal and bilateral Secured at: 23 cm Tube secured with: Tape Dental Injury: Teeth and Oropharynx as per pre-operative assessment  Comments: Performed by sam hardy, srna

## 2019-10-15 NOTE — Anesthesia Procedure Notes (Signed)
Arterial Line Insertion Start/End8/16/2021 9:00 AM, 10/15/2019 9:15 AM Performed by: Lance Coon, CRNA, CRNA  Patient location: Pre-op. Preanesthetic checklist: patient identified, IV checked, site marked, risks and benefits discussed, surgical consent, monitors and equipment checked, pre-op evaluation, timeout performed and anesthesia consent Lidocaine 1% used for infiltration Right, radial was placed Catheter size: 20 G Maximum sterile barriers used  and Seldinger technique used  Attempts: 2 Procedure performed without using ultrasound guided technique. Following insertion, dressing applied and Biopatch. Post procedure assessment: normal and unchanged  Patient tolerated the procedure well with no immediate complications.

## 2019-10-15 NOTE — Op Note (Signed)
Date: October 15, 2019  Preoperative diagnosis: Asymptomatic high-grade right ICA stenosis (>80%) in setting of previously radiated neck  Postoperative diagnosis: Same  Procedure: 1.  Ultrasound-guided access of left common femoral vein for placement of TCAR venous flow reversal sheath 2.  Right carotid artery transcatheter angioplasty with stent placement (right TCAR, transcarotid stent system)  Surgeon: Dr. [name], MD  Assistant: Dr. Deitra Mayo, MD  Indications: Patient is a 64 year old male who was seen with high-grade right internal carotid artery stenosis that was asymptomatic but in the setting of previous head and neck radiation.  He presents today for planned transcatheter placement of intravascular stent in the cervical carotid artery after cutdown with TCAR device after risk benefits discussed.  An assistant was needed for exposure and to facilitate sheath exchanges.  Findings: Transverse incision made over the sternocleidomastoid muscle above the right clavicle.  After percutaneous access following cutdown, the right carotid angiogram confirmed high-grade right internal carotid artery stenosis.  The lesion was crossed with a wire and predilated with a 5 mm x 40 mm angioplasty balloon.  This was primarily stented with a 10 mm x 40 mm Enroute stent.  The stent was widely patent after placement with no residual stenosis and preserved runoff distally.  He was awakened from anesthesia with no neurologic deficits.  Anesthesia: General  Details: Patient was taken to the operating room after informed consent was obtained.  Placed on operative table in supine position general endotracheal anesthesia was induced.  A bump was placed under his shoulder and his neck was turned to the left to expose the right side of his neck.  I initially used ultrasound to mark the carotid artery just above the clavicle as well as the splaying of his sternocleidomastoid muscle.  Ultimately  the right neck as well as both groins were prepped and draped in usual sterile fashion.  He got preoperative antibiotics.  He did get preoperative treatment for anaphylaxis to contrast with Solu-Medrol and Benadryl.  Timeout was performed to identify patient, procedure and site.  Initially I performed a transverse incision over the right sternocleidomastoid muscle one fingerbreadth above the clavicle.  Dissected down through the platysma and then splayed the heads of the sternocleidomastoid to dissect down and initially visualized the internal jugular vein that was bluntly mobilized off the internal carotid artery and I did see the right subclavian artery as well.  Ultimately was able to free up enough length of common carotid that I was able to get a blue vessel loop around this as well as an umbilical tape.  Dr. Scot Dock subsequently got access in the left common femoral vein for the venous sheath.  The left common femoral vein was evaluated, it was patent and image was saved.  This was then accessed with a micro-access needle under ultrasound guidance and a Bentson wire was placed allowingthe venous flow reversal sheath to be placed.  Once we had exposure of the common carotid the patient was given 100 units per kilogram IV heparin.  We checked an ACT to maintain greater than 250.  5-0 Prolene was used to put a U stitch in the right common carotid at our exposure just above the clavicle.  I then accessed this with a micropuncture needle, placed a microwire and then a micro sheath.  We got a right carotid angiogram to confirm the carotid bifurcation as well as the high-grade right ICA lesion.  I then placed a stiffer J wire in the right common carotid below  the bifurcation and the Enroute flow reversal sheath was placed in the right common carotid after cutdown within the pursestring.  We had good backbleeding from the sheath and ultimately the sheath was secured with multiple 3-0 silk sutures.  I then hooked up  the flow reversal filter and ultimately once the filter was filled with blood we then hooked up to the venous sheath in the left groin.  We checked that we had good passive flow reversal.  That point time a TCAR timeout was performed to ensure that we had appropriate heart rate and blood pressure as well as a therapeutic ACT.  We then went on active clamp on the proximal right common carotid with a vessel loop.  I then used my micro wire with the preloaded balloon to cross the ICA lesion.  Ultimately initially had some difficulty getting the wire to track and I perform another hand-injection to confirm that I was indeed not a dissection.  I then used an angled KMP catheter to get my wire across the common carotid into the ICA.  I was able to advance this up to the skull base to confirm I was in the ICA.  The lesion was then predilated with a 5 mm x 40 mm angioplasty balloon.  We then subsequent exchanged the balloon for stent and a 10 mm x 40 mm Enroute stent was deployed from the right ICA across the lesion into the distal common carotid artery.  We allowed active flow reversal for 2 minutes after stent deployment to have washout of all the debris.  Final injection showed excellent filling of the ICA stent with no residual stenosis.  At that point time satisfied the results no post balloon angioplasty was required.  We then disconnected the flow reversal system.  The arterial sheath was removed from the right common carotid artery and the pursestring was tied down with a 5-0 Prolene getting good hemostasis.  Checked and had a palpable carotid pulse and good Doppler flow in the right carotid.  Patient was given 50 mg protamine for reversal.  The venous sheath was then removed in the left groin and manual pressure was held.  The right neck incision was then irrigated out with sterile saline.  Use some Surgicel snow for hemostasis.  This platysma was run closed with a 3-0 Vicryl and the skin was run closed with 4  Monocryl.  Dermabond was applied.  He was awakened from anesthesia with no deficits.  Complication: None  Condition: Stable  [name], MD [facility] Office: [place]

## 2019-10-15 NOTE — Discharge Instructions (Signed)
   [facility] [place]  Discharge Instructions   Carotid Endarterectomy (CEA)  Please refer to the following instructions for your post-procedure care. Your surgeon or physician assistant will discuss any changes with you.  Activity  You are encouraged to walk as much as you can. You can slowly return to normal activities but must avoid strenuous activity and heavy lifting until your doctor tell you it's OK. Avoid activities such as vacuuming or swinging a golf club. You can drive after one week if you are comfortable and you are no longer taking prescription pain medications. It is normal to feel tired for serval weeks after your surgery. It is also normal to have difficulty with sleep habits, eating, and bowel movements after surgery. These will go away with time.  Bathing/Showering  You may shower after you come home. Do not soak in a bathtub, hot tub, or swim until the incision heals completely.  Incision Care  Shower every day. Clean your incision with mild soap and water. Pat the area dry with a clean towel. You do not need a bandage unless otherwise instructed. Do not apply any ointments or creams to your incision. You may have skin glue on your incision. Do not peel it off. It will come off on its own in about one week. Your incision may feel thickened and raised for several weeks after your surgery. This is normal and the skin will soften over time. For Men Only: It's OK to shave around the incision but do not shave the incision itself for 2 weeks. It is common to have numbness under your chin that could last for several months.  Diet  Resume your normal diet. There are no special food restrictions following this procedure. A low fat/low cholesterol diet is recommended for all patients with vascular disease. In order to heal from your surgery, it is CRITICAL to get adequate nutrition. Your body requires vitamins, minerals, and protein. Vegetables are the best  source of vitamins and minerals. Vegetables also provide the perfect balance of protein. Processed food has little nutritional value, so try to avoid this.        Medications  Resume taking all of your medications unless your doctor or physician assistant tells you not to. If your incision is causing pain, you may take over-the- counter pain relievers such as acetaminophen (Tylenol). If you were prescribed a stronger pain medication, please be aware these medications can cause nausea and constipation. Prevent nausea by taking the medication with a snack or meal. Avoid constipation by drinking plenty of fluids and eating foods with a high amount of fiber, such as fruits, vegetables, and grains. Do not take Tylenol if you are taking prescription pain medications.  Follow Up  Our office will schedule a follow up appointment 2-3 weeks following discharge.  Please call us immediately for any of the following conditions  Increased pain, redness, drainage (pus) from your incision site. Fever of 101 degrees or higher. If you should develop stroke (slurred speech, difficulty swallowing, weakness on one side of your body, loss of vision) you should call 911 and go to the nearest emergency room.  Reduce your risk of vascular disease:  Stop smoking. If you would like help call [facility] at 1-800-QUIT-NOW (1-800-784-8669) or [place] at 336-586-4000. Manage your cholesterol Maintain a desired weight Control your diabetes Keep your blood pressure down  If you have any questions, please call the office at [phone].   

## 2019-10-15 NOTE — Anesthesia Postprocedure Evaluation (Signed)
Anesthesia Post Note  Patient: [name]  Procedure(s) Performed: RIGHT TRANSCAROTID ARTERY REVASCULARIZATION (Right Neck) ULTRASOUND GUIDANCE FOR VASCULAR ACCESS (Left Groin)     Patient location during evaluation: PACU Anesthesia Type: General Level of consciousness: awake and alert Pain management: pain level controlled Vital Signs Assessment: post-procedure vital signs reviewed and stable Respiratory status: spontaneous breathing, nonlabored ventilation, respiratory function stable and patient connected to nasal cannula oxygen Cardiovascular status: blood pressure returned to baseline and stable Postop Assessment: no apparent nausea or vomiting Anesthetic complications: no   No complications documented.  Last Vitals:  Vitals:   10/15/19 1421 10/15/19 1451  BP: 107/69 128/75  Pulse: 67 87  Resp: 20 15  Temp:    SpO2: 94% 93%    Last Pain:  Vitals:   10/15/19 1451  TempSrc:   PainSc: 0-No pain                 Moe Graca S

## 2019-10-15 NOTE — Transfer of Care (Signed)
Immediate Anesthesia Transfer of Care Note  Patient: Tony Hunt  Procedure(s) Performed: RIGHT TRANSCAROTID ARTERY REVASCULARIZATION (Right Neck) ULTRASOUND GUIDANCE FOR VASCULAR ACCESS (Left Groin)  Patient Location: PACU  Anesthesia Type:General  Level of Consciousness: awake, alert  and oriented  Airway & Oxygen Therapy: Patient Spontanous Breathing  Post-op Assessment: Report given to RN and Post -op Vital signs reviewed and stable  Post vital signs: Reviewed and stable  Last Vitals:  Vitals Value Taken Time  BP    Temp    Pulse 81 10/15/19 1121  Resp 22 10/15/19 1121  SpO2 97 % 10/15/19 1121  Vitals shown include unvalidated device data.  Last Pain:  Vitals:   10/15/19 0808  TempSrc:   PainSc: 0-No pain      Patients Stated Pain Goal: 3 (75/43/60 6770)  Complications: No complications documented.

## 2019-10-16 ENCOUNTER — Encounter (HOSPITAL_COMMUNITY): Payer: Self-pay | Admitting: Vascular Surgery

## 2019-10-16 LAB — CBC
HCT: 38.3 % — ABNORMAL LOW (ref 39.0–52.0)
Hemoglobin: 13 g/dL (ref 13.0–17.0)
MCH: 30.9 pg (ref 26.0–34.0)
MCHC: 33.9 g/dL (ref 30.0–36.0)
MCV: 91 fL (ref 80.0–100.0)
Platelets: 263 10*3/uL (ref 150–400)
RBC: 4.21 MIL/uL — ABNORMAL LOW (ref 4.22–5.81)
RDW: 12.6 % (ref 11.5–15.5)
WBC: 12.6 10*3/uL — ABNORMAL HIGH (ref 4.0–10.5)
nRBC: 0 % (ref 0.0–0.2)

## 2019-10-16 LAB — BASIC METABOLIC PANEL
Anion gap: 10 (ref 5–15)
BUN: 22 mg/dL (ref 8–23)
CO2: 22 mmol/L (ref 22–32)
Calcium: 8.8 mg/dL — ABNORMAL LOW (ref 8.9–10.3)
Chloride: 107 mmol/L (ref 98–111)
Creatinine, Ser: 1.71 mg/dL — ABNORMAL HIGH (ref 0.61–1.24)
GFR calc Af Amer: 48 mL/min — ABNORMAL LOW (ref >60)
GFR calc non Af Amer: 41 mL/min — ABNORMAL LOW (ref >60)
Glucose, Bld: 162 mg/dL — ABNORMAL HIGH (ref 70–99)
Potassium: 4 mmol/L (ref 3.5–5.1)
Sodium: 139 mmol/L (ref 135–145)

## 2019-10-16 MED ORDER — TRAMADOL HCL 50 MG PO TABS: 50.0000 mg | ORAL_TABLET | Freq: Four times a day (QID) | ORAL | 0 refills | Status: DC | PRN

## 2019-10-16 NOTE — Progress Notes (Signed)
In patients room to d/c his aline.  When I went to remove the aline the catheter remained in the patient. Pressure held until Dr. Carlis Abbott arrived and  d/ced the catheter - it was intact upon removal.  Pressure dressing applied.  Patient voiced no complaints during this and no distress noted.

## 2019-10-16 NOTE — Progress Notes (Addendum)
[facility] [place]  Subjective  - no complaints, neuro intact.   Objective 119/84 70 98.4 F (36.9 C) (Oral) 17 94%  Intake/Output Summary (Last 24 hours) at 10/16/2019 0740 Last data filed at 10/16/2019 0340 Gross per 24 hour  Intake 2220 ml  Output 905 ml  Net 1315 ml    CN II-XII grossly intact No neuro deficits Left groin with ecchymosis and bruising but no appreciable hematoma Right neck incision clean dry and intact with no hematoma  Laboratory Lab Results: Recent Labs    10/15/19 1203 10/16/19 0530  WBC 5.5 12.6*  HGB 13.7 13.0  HCT 41.0 38.3*  PLT 229 263   BMET Recent Labs    10/15/19 1203 10/16/19 0530  NA  --  139  K  --  4.0  CL  --  107  CO2  --  22  GLUCOSE  --  162*  BUN  --  22  CREATININE 1.74* 1.71*  CALCIUM  --  8.8*    COAG Lab Results  Component Value Date   INR 1.0 10/03/2019   INR 1.0 05/26/2016   INR 1.0 11/22/2013   No results found for: PTT  Assessment/Planning:  Postop day 1 status post right TCAR for high-grade asymptomatic stenosis in setting of previous neck radiation.  Has done well overnight.  Remains neuro intact this morning.  A-line has been DC'd.  He has voided.  Plan discharge today on aspirin Plavix statin which he is already taking.  Follow-up in 1 month with carotid duplex in our office.  Cr 1.7 which is his baseline.  [name] 10/16/2019 7:40 AM --

## 2019-10-16 NOTE — Discharge Summary (Signed)
Carotid Discharge Summary     RICKE KIMOTO [birth year] 64 y.o. male  [id number]  Admission Date: 10/15/2019  Discharge Date: 10/16/2019  Physician: [name], MD  Admission Diagnosis: Post-operative state [facility] Course:  The patient was admitted to the hospital and taken to the operating room on 10/15/2019 and underwent right carotid artery transcatheter angioplasty with stent placement.  The pt tolerated the procedure well and was transported to the PACU in good condition.   By POD 1, the pt neuro status remained intact. Pain well controlled. Right neck incision clean, dry and intact without swelling or hematoma. Left femoral vein access site with ecchymosis but no swelling or hematoma.  The remainder of the hospital course consisted of increasing mobilization and increasing intake of solids without difficulty.  He remained stable for discharge. He will continue on his aspirin, plavix and statin. Prescription for pain medication was sent to patients pharmacy for post operative pain management. He will follow up with Dr. Carlis Abbott in 4 weeks with carotid duplex  Recent Labs    10/16/19 0530  NA 139  K 4.0  CL 107  CO2 22  GLUCOSE 162*  BUN 22  CALCIUM 8.8*   Recent Labs    10/15/19 1203 10/16/19 0530  WBC 5.5 12.6*  HGB 13.7 13.0  HCT 41.0 38.3*  PLT 229 263   No results for input(s): INR in the last 72 hours.   Discharge Instructions    Discharge patient   Complete by: As directed    Discharge disposition: 01-Home or Self Care   Discharge patient date: 10/16/2019      Discharge Diagnosis:  Post-operative state [Z98.890]  Secondary Diagnosis: Patient Active Problem List   Diagnosis Date Noted  . Post-operative state 10/15/2019  . SOB (shortness of breath) 10/11/2019  . Gallstones 08/05/2019  . Hematochezia 05/17/2019  . Abdominal pain, epigastric 05/17/2019  . B12 deficiency 11/06/2018  . Paresthesia 08/02/2018  . Chronic  diastolic CHF (congestive heart failure) ([place]) 12/21/2017  . Health care maintenance 11/07/2017  . Advance care planning 11/07/2017  . 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 Carotid artery disease ([place]) 09/12/2017  . Migraine with aura 03/31/2017  . Bloating 11/18/2016  . Left carotid bruit 05/26/2016  . Lower urinary tract symptoms (LUTS) 04/16/2016  . Sinus bradycardia 12/15/2015  . Neck pain 02/05/2015  . AAA (abdominal aortic aneurysm) without rupture ([place]) 02/14/2014  . Lower abdominal pain 12/23/2013  . Medicare annual wellness visit, subsequent 07/03/2013  . Rash 01/08/2013  . Gout 12/21/2012  . CAD (coronary artery disease) 08/28/2012  . Hypothyroidism 08/26/2012  . Panic 03/07/2012  . 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 COUGH 02/19/2010  . Hyperlipidemia 05/10/2007  . ANXIETY DEPRESSION 05/10/2007  . 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 Essential hypertension 05/10/2007  . ESOPHAGEAL STRICTURE 05/10/2007  . GERD 05/10/2007  . 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 SLEEP APNEA 05/10/2007  . ALCOHOL ABUSE, HX OF 05/10/2007   Past Medical History:  Diagnosis Date  . AAA (abdominal aortic aneurysm) without rupture ([place]) 02/14/2014   [place] 1/19:  AAA 3 cm  . ALCOHOL ABUSE, HX OF    distant history  . Anxiety    occ panic sx, increased after death of mother  . ANXIETY DEPRESSION 05/10/2007   history of, during difficult relationship  . CAD (coronary artery disease) 08/28/2012   08/25/2012 Successful PTCA/DES x mid LAD // LAD prox 30, mid stent patent; D1 40; LCx  ok; OM2 50, 50; RCA prox 20; EF 55-65  // Nuclear stress test 9/19: EF 58, small inferobasal infarct, no ischemia, Low Risk (inf defect ? diaph atten?)   . Cancer ([place]) 03/2010   tumor on larynx/tx radiation  . Carotid artery disease ([place]) 09/12/2017   [place] 6/19:  R 60-79; L 1-39; R vertebral occluded; FU 12 months  . 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 Chronic diastolic CHF (congestive heart failure) ([place]) 12/21/2017   Echo 11/17/2017 - Mild concentric LVH, EF 65-70, normal wall motion, grade 2 diastolic dysfunction, mild LAE, normal RVSF, trivial TR  . CKD (chronic kidney disease)    Nephrologist is Dr. Corliss Parish (10/03/19)  . Dilatation of aorta ([facility]) 02/14/2014  . Diverticulosis   . ESOPHAGEAL STRICTURE 05/10/2007  . GERD 05/10/2007  . Heart disease   . HIATAL HERNIA 05/10/2007  . History of echocardiogram    Echo 9/19: mild conc LVH, vigorous LVF, EF 65-70, Gr 2 DD, mild LAE, normal RVSF, trivial AI  . History of radiation therapy   . HYPERLIPIDEMIA 05/10/2007  . HYPERTENSION 05/10/2007  . 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 SLEEP APNEA 05/10/2007   lost 100lb-not now  . Upper airway cough syndrome    Per Dr. Melvyn Novas, pulmonary     Allergies as of 10/16/2019      Reactions   Imdur [isosorbide Dinitrate] Other (See Comments)   Headache at 60mg  dose, but able to tolerate 30mg  per day   Iohexol Anaphylaxis, Other (See Comments)   OK with 13 hour prep   Lisinopril Other (See Comments)   Elevated cr- improved off med   Pravastatin Nausea Only, Other (See Comments)   Intense headache   Codeine Itching   Iodine-131 Other (See Comments)   Passed out   Simvastatin Other (See Comments)   Edema   Statins Other (See Comments)   Sig edema on simvastatin      Medication List    TAKE these medications   aspirin EC 81 MG tablet Take 81 mg by mouth daily. Swallow whole.   clopidogrel 75 MG tablet Commonly known as: PLAVIX TAKE 1 TABLET DAILY WITH BREAKFAST   cyanocobalamin 1000 MCG/ML injection Commonly known as: (VITAMIN B-12) Inject 1042mcg monthly.   fenofibrate 160 MG tablet Take 1 tablet (160 mg total) by mouth daily.   gabapentin 600 MG tablet Commonly known as: Neurontin Take 1 tablet (600 mg total) by mouth at bedtime.   melatonin 3 MG Tabs tablet Take 3 mg by  mouth at bedtime as needed (sleep.).   metoprolol tartrate 25 MG tablet Commonly known as: LOPRESSOR TAKE ONE-HALF (1/2) TABLET TWICE A DAY   nitroGLYCERIN 0.4 MG SL tablet Commonly known as: Nitrostat Place 1 tablet (0.4 mg total) under the tongue every 5 (five) minutes as needed for chest pain (do not exceed 3 pills during one episode).   pantoprazole 40 MG tablet Commonly known as: PROTONIX TAKE 1 TABLET DAILY   Repatha SureClick 854 MG/ML [place] Generic drug: Evolocumab INJECT 1 PEN INTO THE SKIN EVERY 14 DAYS   rosuvastatin 20 MG tablet Commonly known as: CRESTOR TAKE 1 TABLET DAILY   Synthroid 100 MCG tablet Generic drug: levothyroxine TAKE 1 TABLET DAILY BEFORE BREAKFAST   SYRINGE-NEEDLE (DISP) 3 ML 25G X 1" 3 ML Misc Patient is to use to self inject 1 ml of vitamin B12 per provider instructions.   tamsulosin 0.4 MG Caps capsule Commonly  known as: FLOMAX TAKE 1 CAPSULE DAILY   traMADol 50 MG tablet Commonly known as: Ultram Take 1 tablet (50 mg total) by mouth every 6 (six) hours as needed.        Discharge Instructions:  [facility] Discharge Instructions Carotid Endarterectomy (CEA)  Please refer to the following instructions for your post-procedure care. Your surgeon or physician assistant will discuss any changes with you.  Activity  You are encouraged to walk as much as you can. You can slowly return to normal activities but must avoid strenuous activity and heavy lifting until your doctor tell you it's OK. Avoid activities such as vacuuming or swinging a golf club. You can drive after one week if you are comfortable and you are no longer taking prescription pain medications. It is normal to feel tired for serval weeks after your surgery. It is also normal to have difficulty with sleep habits, eating, and bowel movements after surgery. These will go away with time.  Bathing/Showering  You may shower after you come home.  Do not soak in a bathtub, hot tub, or swim until the incision heals completely.  Incision Care  Shower every day. Clean your incision with mild soap and water. Pat the area dry with a clean towel. You do not need a bandage unless otherwise instructed. Do not apply any ointments or creams to your incision. You may have skin glue on your incision. Do not peel it off. It will come off on its own in about one week. Your incision may feel thickened and raised for several weeks after your surgery. This is normal and the skin will soften over time. For Men Only: It's OK to shave around the incision but do not shave the incision itself for 2 weeks. It is common to have numbness under your chin that could last for several months.  Diet  Resume your normal diet. There are no special food restrictions following this procedure. A low fat/low cholesterol diet is recommended for all patients with vascular disease. In order to heal from your surgery, it is CRITICAL to get adequate nutrition. Your body requires vitamins, minerals, and protein. Vegetables are the best source of vitamins and minerals. Vegetables also provide the perfect balance of protein. Processed food has little nutritional value, so try to avoid this.  Medications  Resume taking all of your medications unless your doctor or physician assistant tells you not to.  If your incision is causing pain, you may take over-the- counter pain relievers such as acetaminophen (Tylenol). If you were prescribed a stronger pain medication, please be aware these medications can cause nausea and constipation.  Prevent nausea by taking the medication with a snack or meal. Avoid constipation by drinking plenty of fluids and eating foods with a high amount of fiber, such as fruits, vegetables, and grains. Do not take Tylenol if you are taking prescription pain medications.  Follow Up  Our office will schedule a follow up appointment 2-3 weeks following  discharge.  Please call us immediately for any of the following conditions  . Increased pain, redness, drainage (pus) from your incision site. . Fever of 101 degrees or higher. . If you should develop stroke (slurred speech, difficulty swallowing, weakness on one side of your body, loss of vision) you should call 911 and go to the nearest emergency room. .  Reduce your risk of vascular disease:  . Stop smoking. If you would like help call [facility] at 1-800-QUIT-NOW 707-079-5203) or  [place] at 8074284479. . Manage your cholesterol . Maintain a desired weight . Control your diabetes . Keep your blood pressure down .  If you have any questions, please call the office at [phone].  Prescriptions given: Tramadol 50 mg #12 No Refill  Disposition: Home  Patient's condition: is Excellent  Follow up: 1. Dr. Carlis Abbott in 4 weeks with carotid duplex.   Earnestine Tuohey PA-C [facility] [phone]   --- For [facility] Registry use ---   Modified Rankin score at D/C (0-6): 0  IV medication needed for:  1. Hypertension: No 2. Hypotension: No  Post-op Complications: No  1. Post-op CVA or TIA: No  If yes: Event classification (right eye, left eye, right cortical, left cortical, verterobasilar, other): n/a  If yes: Timing of event (intra-op, <6 hrs post-op, >=6 hrs post-op, unknown): n/a  2. CN injury: No  If yes: CN n/a injuried   3. Myocardial infarction: No  If yes: Dx by (EKG or clinical, Troponin): n/a  4.  CHF: No  5.  Dysrhythmia (new): No  6. Wound infection: No  7. Reperfusion symptoms: No  8. Return to OR: No  If yes: return to OR for (bleeding, neurologic, other CEA incision, other): n/a  Discharge medications: Statin use:  Yes ASA use:  Yes   Beta blocker use:  Yes ACE-Inhibitor use:  No  ARB use:  No CCB use: No P2Y12 Antagonist use: Yes, [ X] Plavix, [ ]  Plasugrel, [ ]  Ticlopinine, [ ]  Ticagrelor, [ ]  Other, [ ]  No for  medical reason, [ ]  Non-compliant, [ ]  Not-indicated Anti-coagulant use:  No, [ ]  Warfarin, [ ]  Rivaroxaban, [ ]  Dabigatran,

## 2019-10-16 NOTE — Progress Notes (Signed)
Pt received discharge instructions and does not have any questions or concerns. Pt is ready for discharge.

## 2019-10-17 DIAGNOSIS — F419 Anxiety disorder, unspecified: Secondary | ICD-10-CM | POA: Diagnosis not present

## 2019-10-17 DIAGNOSIS — F339 Major depressive disorder, recurrent, unspecified: Secondary | ICD-10-CM | POA: Diagnosis not present

## 2019-10-17 DIAGNOSIS — I714 Abdominal aortic aneurysm, without rupture: Secondary | ICD-10-CM | POA: Diagnosis not present

## 2019-10-17 DIAGNOSIS — K449 Diaphragmatic hernia without obstruction or gangrene: Secondary | ICD-10-CM | POA: Diagnosis not present

## 2019-10-17 DIAGNOSIS — G4733 Obstructive sleep apnea (adult) (pediatric): Secondary | ICD-10-CM | POA: Diagnosis not present

## 2019-10-17 DIAGNOSIS — Z48812 Encounter for surgical aftercare following surgery on the circulatory system: Secondary | ICD-10-CM | POA: Diagnosis not present

## 2019-10-17 DIAGNOSIS — C329 Malignant neoplasm of larynx, unspecified: Secondary | ICD-10-CM | POA: Diagnosis not present

## 2019-10-17 DIAGNOSIS — I6523 Occlusion and stenosis of bilateral carotid arteries: Secondary | ICD-10-CM | POA: Diagnosis not present

## 2019-10-17 DIAGNOSIS — I2510 Atherosclerotic heart disease of native coronary artery without angina pectoris: Secondary | ICD-10-CM | POA: Diagnosis not present

## 2019-10-19 DIAGNOSIS — Z48812 Encounter for surgical aftercare following surgery on the circulatory system: Secondary | ICD-10-CM | POA: Diagnosis not present

## 2019-10-19 DIAGNOSIS — I6523 Occlusion and stenosis of bilateral carotid arteries: Secondary | ICD-10-CM | POA: Diagnosis not present

## 2019-10-19 DIAGNOSIS — C329 Malignant neoplasm of larynx, unspecified: Secondary | ICD-10-CM | POA: Diagnosis not present

## 2019-10-19 DIAGNOSIS — F419 Anxiety disorder, unspecified: Secondary | ICD-10-CM | POA: Diagnosis not present

## 2019-10-19 DIAGNOSIS — I714 Abdominal aortic aneurysm, without rupture: Secondary | ICD-10-CM | POA: Diagnosis not present

## 2019-10-19 DIAGNOSIS — I2510 Atherosclerotic heart disease of native coronary artery without angina pectoris: Secondary | ICD-10-CM | POA: Diagnosis not present

## 2019-10-20 ENCOUNTER — Other Ambulatory Visit: Payer: Self-pay | Admitting: Family Medicine

## 2019-10-22 ENCOUNTER — Telehealth: Payer: Self-pay | Admitting: *Deleted

## 2019-10-22 DIAGNOSIS — Z48812 Encounter for surgical aftercare following surgery on the circulatory system: Secondary | ICD-10-CM | POA: Diagnosis not present

## 2019-10-22 DIAGNOSIS — C329 Malignant neoplasm of larynx, unspecified: Secondary | ICD-10-CM | POA: Diagnosis not present

## 2019-10-22 DIAGNOSIS — I6523 Occlusion and stenosis of bilateral carotid arteries: Secondary | ICD-10-CM | POA: Diagnosis not present

## 2019-10-22 DIAGNOSIS — I714 Abdominal aortic aneurysm, without rupture: Secondary | ICD-10-CM | POA: Diagnosis not present

## 2019-10-22 DIAGNOSIS — F419 Anxiety disorder, unspecified: Secondary | ICD-10-CM | POA: Diagnosis not present

## 2019-10-22 DIAGNOSIS — I2510 Atherosclerotic heart disease of native coronary artery without angina pectoris: Secondary | ICD-10-CM | POA: Diagnosis not present

## 2019-10-22 NOTE — Telephone Encounter (Signed)
Electronic refill request. Levothyroxine Last office visit:   10/08/2019 Last Filled:     90 tablet 0 07/02/2019  Last TSH 07/2018

## 2019-10-22 NOTE — Telephone Encounter (Signed)
Olivia Mackie nurse with [facility] left a voicemail stating that the patient is still complaining of SOB at rest. Olivia Mackie stated that the patient's weight is within range, lungs are clear and no pitting edema. Olivia Mackie requested a call back with advice.

## 2019-10-22 NOTE — Telephone Encounter (Signed)
Please check with patient.  Is this worse than prev?  He had attributed some of this to prev weight gain.  If this is worse, then that would direct the work up.  Please let me know.  Thanks.

## 2019-10-22 NOTE — Telephone Encounter (Signed)
Patient says he told Olivia Mackie that it all depends on how he is sitting.  If he is sitting straight up or even laying flat, he can breathe fine but sitting in a slight reclining position makes it hard to get his breath.  This is not anything new and he states that if it were, he would have let us know or went to the ER.

## 2019-10-23 NOTE — Telephone Encounter (Signed)
Sent. Thanks.   

## 2019-10-24 DIAGNOSIS — F419 Anxiety disorder, unspecified: Secondary | ICD-10-CM | POA: Diagnosis not present

## 2019-10-24 DIAGNOSIS — I2510 Atherosclerotic heart disease of native coronary artery without angina pectoris: Secondary | ICD-10-CM | POA: Diagnosis not present

## 2019-10-24 DIAGNOSIS — C329 Malignant neoplasm of larynx, unspecified: Secondary | ICD-10-CM | POA: Diagnosis not present

## 2019-10-24 DIAGNOSIS — I6523 Occlusion and stenosis of bilateral carotid arteries: Secondary | ICD-10-CM | POA: Diagnosis not present

## 2019-10-24 DIAGNOSIS — Z48812 Encounter for surgical aftercare following surgery on the circulatory system: Secondary | ICD-10-CM | POA: Diagnosis not present

## 2019-10-24 DIAGNOSIS — I714 Abdominal aortic aneurysm, without rupture: Secondary | ICD-10-CM | POA: Diagnosis not present

## 2019-10-26 DIAGNOSIS — I714 Abdominal aortic aneurysm, without rupture: Secondary | ICD-10-CM | POA: Diagnosis not present

## 2019-10-26 DIAGNOSIS — I2510 Atherosclerotic heart disease of native coronary artery without angina pectoris: Secondary | ICD-10-CM | POA: Diagnosis not present

## 2019-10-26 DIAGNOSIS — C329 Malignant neoplasm of larynx, unspecified: Secondary | ICD-10-CM | POA: Diagnosis not present

## 2019-10-26 DIAGNOSIS — I6523 Occlusion and stenosis of bilateral carotid arteries: Secondary | ICD-10-CM | POA: Diagnosis not present

## 2019-10-26 DIAGNOSIS — Z48812 Encounter for surgical aftercare following surgery on the circulatory system: Secondary | ICD-10-CM | POA: Diagnosis not present

## 2019-10-26 DIAGNOSIS — F419 Anxiety disorder, unspecified: Secondary | ICD-10-CM | POA: Diagnosis not present

## 2019-10-26 NOTE — Telephone Encounter (Signed)
Patient advised and agrees

## 2019-10-26 NOTE — Telephone Encounter (Signed)
Please update patient.  I been considering this in the meantime.  This does not sound ominous given his previous work-up.  If he is clearly getting worse then let us know but otherwise I think it makes sense to observe for now, and to the best he can with a gradual weight loss.  Thanks.

## 2019-10-29 DIAGNOSIS — C329 Malignant neoplasm of larynx, unspecified: Secondary | ICD-10-CM | POA: Diagnosis not present

## 2019-10-29 DIAGNOSIS — F419 Anxiety disorder, unspecified: Secondary | ICD-10-CM | POA: Diagnosis not present

## 2019-10-29 DIAGNOSIS — I714 Abdominal aortic aneurysm, without rupture: Secondary | ICD-10-CM | POA: Diagnosis not present

## 2019-10-29 DIAGNOSIS — Z48812 Encounter for surgical aftercare following surgery on the circulatory system: Secondary | ICD-10-CM | POA: Diagnosis not present

## 2019-10-29 DIAGNOSIS — I6523 Occlusion and stenosis of bilateral carotid arteries: Secondary | ICD-10-CM | POA: Diagnosis not present

## 2019-10-29 DIAGNOSIS — I2510 Atherosclerotic heart disease of native coronary artery without angina pectoris: Secondary | ICD-10-CM | POA: Diagnosis not present

## 2019-10-30 ENCOUNTER — Other Ambulatory Visit: Payer: Self-pay | Admitting: Family Medicine

## 2019-10-30 DIAGNOSIS — E538 Deficiency of other specified B group vitamins: Secondary | ICD-10-CM

## 2019-11-06 ENCOUNTER — Encounter: Payer: Self-pay | Admitting: Cardiovascular Disease

## 2019-11-06 ENCOUNTER — Ambulatory Visit (INDEPENDENT_AMBULATORY_CARE_PROVIDER_SITE_OTHER): Payer: Medicare Other | Admitting: Cardiovascular Disease

## 2019-11-06 ENCOUNTER — Other Ambulatory Visit: Payer: Self-pay

## 2019-11-06 VITALS — BP 130/78 | HR 64 | Ht 67.0 in | Wt 199.2 lb

## 2019-11-06 DIAGNOSIS — N051 Unspecified nephritic syndrome with focal and segmental glomerular lesions: Secondary | ICD-10-CM | POA: Diagnosis not present

## 2019-11-06 DIAGNOSIS — R809 Proteinuria, unspecified: Secondary | ICD-10-CM | POA: Diagnosis not present

## 2019-11-06 DIAGNOSIS — I6521 Occlusion and stenosis of right carotid artery: Secondary | ICD-10-CM

## 2019-11-06 DIAGNOSIS — E785 Hyperlipidemia, unspecified: Secondary | ICD-10-CM | POA: Diagnosis not present

## 2019-11-06 DIAGNOSIS — Z6833 Body mass index (BMI) 33.0-33.9, adult: Secondary | ICD-10-CM | POA: Diagnosis not present

## 2019-11-06 DIAGNOSIS — I2510 Atherosclerotic heart disease of native coronary artery without angina pectoris: Secondary | ICD-10-CM | POA: Diagnosis not present

## 2019-11-06 DIAGNOSIS — I10 Essential (primary) hypertension: Secondary | ICD-10-CM | POA: Diagnosis not present

## 2019-11-06 DIAGNOSIS — I129 Hypertensive chronic kidney disease with stage 1 through stage 4 chronic kidney disease, or unspecified chronic kidney disease: Secondary | ICD-10-CM | POA: Diagnosis not present

## 2019-11-06 DIAGNOSIS — I6523 Occlusion and stenosis of bilateral carotid arteries: Secondary | ICD-10-CM

## 2019-11-06 NOTE — Patient Instructions (Signed)
Medication Instructions:  Your physician recommends that you continue on your current medications as directed. Please refer to the Current Medication list given to you today.  *If you need a refill on your cardiac medications before your next appointment, please call your pharmacy*   Lab Work: TODAY - cholesterol, liver panel, basic metabolic panel If you have labs (blood work) drawn today and your tests are completely normal, you will receive your results only by: Marland Kitchen MyChart Message (if you have MyChart) OR . A paper copy in the mail If you have any lab test that is abnormal or we need to change your treatment, we will call you to review the results.   Testing/Procedures: None Ordered   Follow-Up: At [facility], you and your health needs are our priority.  As part of our continuing mission to provide you with exceptional heart care, we have created designated Provider Care Teams.  These Care Teams include your primary Cardiologist (physician) and Advanced Practice Providers (APPs -  Physician Assistants and Nurse Practitioners) who all work together to provide you with the care you need, when you need it.  We recommend signing up for the patient portal called "MyChart".  Sign up information is provided on this After Visit Summary.  MyChart is used to connect with patients for Virtual Visits (Telemedicine).  Patients are able to view lab/test results, encounter notes, upcoming appointments, etc.  Non-urgent messages can be sent to your provider as well.   To learn more about what you can do with MyChart, go to [email or web address].    Your next appointment:   1 year(s)  The format for your next appointment:   In Person  Provider:   You may see Mertie Moores, MD or one of the following Advanced Practice Providers on your designated Care Team:    Richardson Dopp, PA-C  [place], [place]

## 2019-11-06 NOTE — Progress Notes (Signed)
Tony Hunt Date of Birth  1955-03-20       [name] [address], [address], [place], [place]  [address]   [place], [place]  [address] [phone]     [phone]   Fax  [phone]    Fax [phone]  Problem List: 1. CAD -  08/25/12 - 2.75 x 12 mm Promus Premier DES was deployed in the mid LAD. The stent was post-dilated with a 3.0 x 9 mm [place] balloon - Mid LAD. 2. Hypertension 3. Hypothyroidism 4. hyperlipidemia  Previous Notes :   Tony Hunt is a 64 yo with hx of progressive CP.  These pains have progressed over the past several weeks.  Initially he had only one or 2 times per week. These pains have progressed and he now has several episodes of chest pain each day.  The pains are associated with some dyspnea and lightheadness.   They are not associated with exercise, eating, drinking, change of position. The pains seem to last about 1 minute.  He has not found anything that specifically relieves the pain. They tend to resolve spontaneously.  He had a particularly bad episode of chest discomfort early this week and went to the emergency room. His workup there was unremarkable.  He's had progressive angina. He had an episode of chest pain the office today. The there were no associated EKG changes.  August 30, 2012:  Tony Hunt was seen last week with [facility].  He had a stent placed in his mid left anterior descending artery. He was discharged the next day but started having severe headaches and night sweats. Initially he thought it may be due to the Plavix but ultimately he decided that it seemed to be more related to the Pravachol.  He's feeling much better at this time.  Sept. 23, 2014:  He had several weeks of intermittant CP ( lasted 2-3 minutes) after getting his stent.  Now , these have resolved.  He is back doing all of his normal activities without CP.  He joined the [employer] last week.  He has been 1 time.   He has noticed  some easy bleeding.    11/22/2013:  He has been having more angina CP - typically occurs at rest.  Lasts a few seconds.  Occasionally longer.  Relieved with NTG Walking lots. 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   [facility] .   Dec. 18, 2015: And 1 was having some chest pain when I last done in September. Cardiac admission did not reveal any significant stenosis. History of PCI.  Has been doing better. Not exercising as much.  No trouble deer hunting this past fall.   August 27, 2014:  Doing well.  Hunts and fishes regularly . No angina   Jan. 17, 2017:  Tony Hunt is seen for follow up of his CAD' No significant episodes of CP  Still active.    Oct. 16, 2017:  Tony Hunt is seen today for follow up of his CAD. 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  May 26, 2016:  Tony Hunt has had marked dyspnea at rest and with exertion.  No fever, no cough Chest tightness / pain .   radiates through to back .  Last 5-10 minutes, less if he takes SL NTG .  Similar to angina  prior to stenting in 2014  Has been going on for 2 weeks.   Got worse this weekend Took 3 SL NTG.    August 05, 2016:  Seen for follow up of his CAD, hypertension and hyperlipidemia. Still having some dyspnea and DOE Cath showed no significant CAD Former smoker , quit 7 years ago   March 16, 2017:  Tony Hunt is seen today  No CP or dyspnea.   Going to the [facility] several times a week   Hunted a lot this past winter   Sept. 3, 2020  Tony Hunt is seen today  Hx of CAD - PCI in the past  No CP , no dyspnea Some exercise.  Walks near [place].   Recent labs  Creatinine is mildly elevated, - chronic issue for him  Chol = 184 HDL is 34 LDL = 106    Sept. 7, 2021:  Tony Hunt is seen for follow up of his CAD, AAA ,  Had a   Right carotid artery transcatheter angioplasty with stent placement (right TCAR, transcarotid stent system) by  Dr. Carlis Abbott . Getting along fairly well   Current Outpatient Medications on File Prior to Visit  Medication Sig Dispense Refill  . aspirin EC 81 MG tablet Take 81 mg by mouth daily. Swallow whole.    . clopidogrel (PLAVIX) 75 MG tablet TAKE 1 TABLET DAILY WITH BREAKFAST 90 tablet 3  . cyanocobalamin (,VITAMIN B-12,) 1000 MCG/ML injection AT THE START OF THERAPY INJECT WEEKLY FOR 4 WEEKS AND THEN MONTHLY THEREAFTER, FOR VITAMIN B12 DEFICIENCY 3 mL 3  . fenofibrate 160 MG tablet Take 1 tablet (160 mg total) by mouth daily. 90 tablet 3  . gabapentin (NEURONTIN) 600 MG tablet Take 1 tablet (600 mg total) by mouth at bedtime.    Marland Kitchen levothyroxine (SYNTHROID) 100 MCG tablet TAKE 1 TABLET DAILY BEFORE BREAKFAST 90 tablet 1  . melatonin 3 MG TABS tablet Take 3 mg by mouth at bedtime as needed (sleep.).    Marland Kitchen metoprolol tartrate (LOPRESSOR) 25 MG tablet TAKE ONE-HALF (1/2) TABLET TWICE A DAY 90 tablet 3  . nitroGLYCERIN (NITROSTAT) 0.4 MG SL tablet Place 1 tablet (0.4 mg total) under the tongue every 5 (five) minutes as needed for chest pain (do not exceed 3 pills during one episode). 25 tablet 6  . pantoprazole (PROTONIX) 40 MG tablet TAKE 1 TABLET DAILY 90 tablet 3  . REPATHA SURECLICK 607 MG/ML SOAJ INJECT 1 PEN INTO THE SKIN EVERY 14 DAYS 6 mL 3  . rosuvastatin (CRESTOR) 20 MG tablet TAKE 1 TABLET DAILY 90 tablet 3  . SYRINGE-NEEDLE, DISP, 3 ML 25G X 1" 3 ML MISC Patient is to use to self inject 1 ml of vitamin B12 per provider instructions. 10 each 1  . tamsulosin (FLOMAX) 0.4 MG CAPS capsule TAKE 1 CAPSULE DAILY 90 capsule 3  . traMADol (ULTRAM) 50 MG tablet Take 1 tablet (50 mg total) by mouth every 6 (six) hours as needed. 12 tablet 0   No current facility-administered medications on file prior to visit.    Allergies  Allergen Reactions  . Imdur [Isosorbide Dinitrate] Other (See Comments)    Headache at 60mg  dose, but able to tolerate 30mg  per day  . Iohexol Anaphylaxis and Other (See Comments)     OK with 13 hour prep  . Lisinopril Other (See Comments)    Elevated cr- improved off med  . Pravastatin Nausea Only and Other (See Comments)    Intense  headache  . Codeine Itching  . Iodine-131 Other (See Comments)    Passed out  . Simvastatin Other (See Comments)    Edema  . Statins Other (See Comments)    Sig edema on simvastatin    Past Medical History:  Diagnosis Date  . AAA (abdominal aortic aneurysm) without rupture ([place]) 02/14/2014   [place] 1/19:  AAA 3 cm  . ALCOHOL ABUSE, HX OF    distant history  . Anxiety    occ panic sx, increased after death of mother  . ANXIETY DEPRESSION 05/10/2007   history of, during difficult relationship  . CAD (coronary artery disease) 08/28/2012   08/25/2012 Successful PTCA/DES x mid LAD // LAD prox 30, mid stent patent; D1 40; LCx ok; OM2 50, 50; RCA prox 20; EF 55-65  // Nuclear stress test 9/19: EF 58, small inferobasal infarct, no ischemia, Low Risk (inf defect ? diaph atten?)   . 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 Carotid artery disease ([place]) 09/12/2017   [place] 6/19:  R 60-79; L 1-39; R vertebral occluded; FU 12 months  . 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 Chronic diastolic CHF (congestive heart failure) ([place]) 12/21/2017   Echo 11/17/2017 - Mild concentric LVH, EF 65-70, normal wall motion, grade 2 diastolic dysfunction, mild LAE, normal RVSF, trivial TR  . CKD (chronic kidney disease)    Nephrologist is Dr. Corliss Parish (10/03/19)  . Dilatation of aorta ([facility]) 02/14/2014  . Diverticulosis   . ESOPHAGEAL STRICTURE 05/10/2007  . GERD 05/10/2007  . Heart disease   . HIATAL HERNIA 05/10/2007  . History of echocardiogram    Echo 9/19: mild conc LVH, vigorous LVF, EF 65-70, Gr 2 DD, mild LAE, normal RVSF, trivial AI  . History of radiation therapy   . HYPERLIPIDEMIA 05/10/2007  . HYPERTENSION 05/10/2007  . 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 SLEEP APNEA  05/10/2007   lost 100lb-not now  . Upper airway cough syndrome    Per Dr. Melvyn Novas, pulmonary     Past Surgical History:  Procedure Laterality Date  . APPENDECTOMY    . BACK SURGERY  1997   lower back x3  . CARPOMETACARPAL ([facility]) FUSION OF THUMB Left 04/02/2014   Procedure: CARPOMETACARPAL ([place]) FUSION OF THUMB/LEFT THUMB INTERPHALNGEAL JOINT FUSION;  Surgeon: Jolyn Nap, MD;  Location: [facility];  Service: Orthopedics;  Laterality: Left;  . COLONOSCOPY    . CORONARY STENT PLACEMENT     2.75 x 12 mm Promus Premier DES was deployed in the mid LAD. The stent was post-dilated with a 3.0 x 9 mm [place] balloon - Mid LAD.  Marland Kitchen LARYNGOSCOPY / BRONCHOSCOPY / ESOPHAGOSCOPY  2012   bx  . LEFT HEART CATH AND CORONARY ANGIOGRAPHY N/A 05/28/2016   Procedure: Left Heart Cath and Coronary Angiography;  Surgeon: Belva Crome, MD;  Location: [facility];  Service: Cardiovascular;  Laterality: N/A;  . LEFT HEART CATHETERIZATION WITH CORONARY ANGIOGRAM N/A 08/25/2012   Procedure: LEFT HEART CATHETERIZATION WITH CORONARY ANGIOGRAM;  Surgeon: Burnell Blanks, MD;  Location: [facility] C[facility] LAB;  Service: Cardiovascular;  Laterality: N/A;  . LEFT HEART CATHETERIZATION WITH CORONARY ANGIOGRAM N/A 11/23/2013   Procedure: LEFT HEART CATHETERIZATION WITH CORONARY ANGIOGRAM;  Surgeon: Jettie Booze, MD;  Location: [facility] CATH LAB;  Service: Cardiovascular;  Laterality: N/A;  . TRANSCAROTID ARTERY REVASCULARIZATION Right 10/15/2019   Procedure: RIGHT TRANSCAROTID ARTERY REVASCULARIZATION;  Surgeon: [name], MD;  Location: [facility];  Service: Vascular;  Laterality: Right;  . ULTRASOUND GUIDANCE FOR VASCULAR ACCESS Left 10/15/2019   Procedure: ULTRASOUND GUIDANCE FOR VASCULAR ACCESS;  Surgeon: [name], MD;  Location: [facility];  Service: Vascular;  Laterality: Left;  . WRIST FRACTURE SURGERY  08/21/10   Right wrist x4    Social History   Tobacco Use  Smoking Status Former Smoker    . Quit date: 09/30/2010  . Years since quitting: 9.1  Smokeless Tobacco 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  Social History   Substance and Sexual Activity  Alcohol Use Yes   Comment: occ     Family History  Problem Relation Age of Onset  . Brain cancer Father   . Dementia Father        h/o brain tumor  . Heart disease Mother   . Hypertension Mother   . Heart disease Brother   . Hypertension Brother   . Hyperlipidemia Brother   . Colon polyps Brother   . Colon cancer Neg Hx   . Prostate cancer Neg Hx   . Esophageal cancer Neg Hx   . Pancreatic cancer Neg Hx   . Liver disease Neg Hx   . Throat cancer Neg Hx     Reviw of Systems:  Reviewed in the HPI.  All other systems are negative.  Physical Exam: Blood pressure 130/78, pulse 64, height 5\' 7"  (1.702 m), weight 199 lb 3.2 oz (90.4 kg), SpO2 96 %.  GEN:  Well nourished, well developed in no acute distress HEENT: Normal NECK: No JVD; No carotid bruits LYMPHATICS: No lymphadenopathy CARDIAC: RRR , no murmurs, rubs, gallops RESPIRATORY:  Clear to auscultation without rales, wheezing or rhonchi  ABDOMEN: Soft, non-tender, non-distended MUSCULOSKELETAL:  No edema; No deformity  SKIN: Warm and dry NEUROLOGIC:  Alert and oriented x 3   ECG:       Assessment / Plan:   1. CAD -  08/25/12 - 2.75 x 12 mm Promus Premier DES. No angina    2. Hypertension -   BP is well controlled.  Cont meds.   3. Hypothyroidism:      4. Hyperlipidemia:   Is on fenofibrate, repatha, Check lipids. Liver, bmp today   5.  PAD:   S/p stenting of his right carotid artery .  Is back on ASA , plavix,    Mertie Moores, MD  11/06/2019 4:25 PM    [facility] [place],  [place], [place]  [address] Pager [phone] Phone: [phone]; Fax: [phone]

## 2019-11-07 LAB — HEPATIC FUNCTION PANEL
ALT: 13 IU/L (ref 0–44)
AST: 21 IU/L (ref 0–40)
Albumin: 3.8 g/dL (ref 3.8–4.8)
Alkaline Phosphatase: 48 IU/L (ref 48–121)
Bilirubin Total: 0.3 mg/dL (ref 0.0–1.2)
Bilirubin, Direct: 0.16 mg/dL (ref 0.00–0.40)
Total Protein: 6.2 g/dL (ref 6.0–8.5)

## 2019-11-07 LAB — BASIC METABOLIC PANEL
BUN/Creatinine Ratio: 7 — ABNORMAL LOW (ref 10–24)
BUN: 13 mg/dL (ref 8–27)
CO2: 22 mmol/L (ref 20–29)
Calcium: 8.8 mg/dL (ref 8.6–10.2)
Chloride: 106 mmol/L (ref 96–106)
Creatinine, Ser: 1.79 mg/dL — ABNORMAL HIGH (ref 0.76–1.27)
GFR calc Af Amer: 45 mL/min/{1.73_m2} — ABNORMAL LOW (ref >59)
GFR calc non Af Amer: 39 mL/min/{1.73_m2} — ABNORMAL LOW (ref >59)
Glucose: 97 mg/dL (ref 65–99)
Potassium: 4.1 mmol/L (ref 3.5–5.2)
Sodium: 141 mmol/L (ref 134–144)

## 2019-11-07 LAB — LIPID PANEL
Chol/HDL Ratio: 2.4 ratio (ref 0.0–5.0)
Cholesterol, Total: 71 mg/dL — ABNORMAL LOW (ref 100–199)
HDL: 30 mg/dL — ABNORMAL LOW (ref >39)
LDL Chol Calc (NIH): 12 mg/dL (ref 0–99)
Triglycerides: 182 mg/dL — ABNORMAL HIGH (ref 0–149)
VLDL Cholesterol Cal: 29 mg/dL (ref 5–40)

## 2019-11-13 ENCOUNTER — Ambulatory Visit (INDEPENDENT_AMBULATORY_CARE_PROVIDER_SITE_OTHER): Payer: Self-pay | Admitting: Vascular Surgery

## 2019-11-13 ENCOUNTER — Other Ambulatory Visit: Payer: Self-pay

## 2019-11-13 ENCOUNTER — Ambulatory Visit (HOSPITAL_COMMUNITY)
Admission: RE | Admit: 2019-11-13 | Discharge: 2019-11-13 | Disposition: A | Discharge status: Home / Self Care | Payer: Medicare Other | Source: Ambulatory Visit | Attending: Vascular Surgery | Admitting: Vascular Surgery

## 2019-11-13 ENCOUNTER — Other Ambulatory Visit (HOSPITAL_COMMUNITY): Payer: Self-pay | Admitting: Vascular Surgery

## 2019-11-13 ENCOUNTER — Encounter: Payer: Self-pay | Admitting: Vascular Surgery

## 2019-11-13 VITALS — BP 104/78 | HR 57 | Temp 97.9°F | Resp 16 | Ht 67.0 in | Wt 194.0 lb

## 2019-11-13 DIAGNOSIS — I779 Disorder of arteries and arterioles, unspecified: Secondary | ICD-10-CM

## 2019-11-13 DIAGNOSIS — R0989 Other specified symptoms and signs involving the circulatory and respiratory systems: Secondary | ICD-10-CM | POA: Diagnosis not present

## 2019-11-13 NOTE — Progress Notes (Signed)
Patient name: Tony Hunt MRN: [id number] DOB: Dec 19, 1955 Sex: male  REASON FOR VISIT: Postop check status post right TCAR  HPI: Tony Hunt is a 64 y.o. male with history of abdominal aortic aneurysm, laryngeal cancer status post radiation, coronary disease status post remote PCI, hypertension, hyperlipidemia and heart failure that presents for postop check after right TCAR.  He recently had a right TCAR procedure on 10/15/2019 for an asymptomatic high-grade stenosis greater than 80% in the setting of prior neck radiation.  He reports no issues after discharge.  FNo neurologic events.  Incision has healed without issue.  He remains on aspirin statin Plavix.  Past Medical History:  Diagnosis Date  . AAA (abdominal aortic aneurysm) without rupture ([place]) 02/14/2014   [place] 1/19:  AAA 3 cm  . ALCOHOL ABUSE, HX OF    distant history  . Anxiety    occ panic sx, increased after death of mother  . ANXIETY DEPRESSION 05/10/2007   history of, during difficult relationship  . CAD (coronary artery disease) 08/28/2012   08/25/2012 Successful PTCA/DES x mid LAD // LAD prox 30, mid stent patent; D1 40; LCx ok; OM2 50, 50; RCA prox 20; EF 55-65  // Nuclear stress test 9/19: EF 58, small inferobasal infarct, no ischemia, Low Risk (inf defect ? diaph atten?)   . 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 Carotid artery disease ([place]) 09/12/2017   [place] 6/19:  R 60-79; L 1-39; R vertebral occluded; FU 12 months  . 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 Chronic diastolic CHF (congestive heart failure) ([place]) 12/21/2017   Echo 11/17/2017 - Mild concentric LVH, EF 65-70, normal wall motion, grade 2 diastolic dysfunction, mild LAE, normal RVSF, trivial TR  . CKD (chronic kidney disease)    Nephrologist is Dr. Corliss Parish (10/03/19)  . Dilatation of aorta ([facility]) 02/14/2014  . Diverticulosis   . ESOPHAGEAL STRICTURE 05/10/2007  . GERD  05/10/2007  . Heart disease   . HIATAL HERNIA 05/10/2007  . History of echocardiogram    Echo 9/19: mild conc LVH, vigorous LVF, EF 65-70, Gr 2 DD, mild LAE, normal RVSF, trivial AI  . History of radiation therapy   . HYPERLIPIDEMIA 05/10/2007  . HYPERTENSION 05/10/2007  . 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 SLEEP APNEA 05/10/2007   lost 100lb-not now  . Upper airway cough syndrome    Per Dr. Melvyn Novas, pulmonary     Past Surgical History:  Procedure Laterality Date  . APPENDECTOMY    . BACK SURGERY  1997   lower back x3  . CARPOMETACARPAL ([facility]) FUSION OF THUMB Left 04/02/2014   Procedure: CARPOMETACARPAL ([place]) FUSION OF THUMB/LEFT THUMB INTERPHALNGEAL JOINT FUSION;  Surgeon: Jolyn Nap, MD;  Location: [facility];  Service: Orthopedics;  Laterality: Left;  . COLONOSCOPY    . CORONARY STENT PLACEMENT     2.75 x 12 mm Promus Premier DES was deployed in the mid LAD. The stent was post-dilated with a 3.0 x 9 mm [place] balloon - Mid LAD.  Marland Kitchen LARYNGOSCOPY / BRONCHOSCOPY / ESOPHAGOSCOPY  2012   bx  . LEFT HEART CATH AND CORONARY ANGIOGRAPHY N/A 05/28/2016   Procedure: Left Heart Cath and Coronary Angiography;  Surgeon: Belva Crome, MD;  Location: [facility];  Service: Cardiovascular;  Laterality: N/A;  . LEFT HEART CATHETERIZATION WITH CORONARY ANGIOGRAM N/A 08/25/2012   Procedure: LEFT HEART CATHETERIZATION WITH  CORONARY ANGIOGRAM;  Surgeon: Burnell Blanks, MD;  Location: [facility] CATH LAB;  Service: Cardiovascular;  Laterality: N/A;  . LEFT HEART CATHETERIZATION WITH CORONARY ANGIOGRAM N/A 11/23/2013   Procedure: LEFT HEART CATHETERIZATION WITH CORONARY ANGIOGRAM;  Surgeon: Jettie Booze, MD;  Location: [facility] CATH LAB;  Service: Cardiovascular;  Laterality: N/A;  . TRANSCAROTID ARTERY REVASCULARIZATION Right 10/15/2019   Procedure: RIGHT TRANSCAROTID ARTERY REVASCULARIZATION;  Surgeon: [name], MD;  Location: [facility];  Service: Vascular;  Laterality:  Right;  . ULTRASOUND GUIDANCE FOR VASCULAR ACCESS Left 10/15/2019   Procedure: ULTRASOUND GUIDANCE FOR VASCULAR ACCESS;  Surgeon: [name], MD;  Location: [facility];  Service: Vascular;  Laterality: Left;  . WRIST FRACTURE SURGERY  08/21/10   Right wrist x4    Family History  Problem Relation Age of Onset  . Brain cancer Father   . Dementia Father        h/o brain tumor  . Heart disease Mother   . Hypertension Mother   . Heart disease Brother   . Hypertension Brother   . Hyperlipidemia Brother   . Colon polyps Brother   . Colon cancer Neg Hx   . Prostate cancer Neg Hx   . Esophageal cancer Neg Hx   . Pancreatic cancer Neg Hx   . Liver disease Neg Hx   . Throat cancer Neg Hx     SOCIAL HISTORY: Social History   Tobacco Use  . Smoking status: Former Smoker    Quit date: 09/30/2010    Years since quitting: 9.1  . Smokeless tobacco: Former Network engineer Use Topics  . Alcohol use: Yes    Comment: occ     Allergies  Allergen Reactions  . Imdur [Isosorbide Dinitrate] Other (See Comments)    Headache at 60mg  dose, but able to tolerate 30mg  per day  . Iohexol Anaphylaxis and Other (See Comments)    OK with 13 hour prep  . Lisinopril Other (See Comments)    Elevated cr- improved off med  . Pravastatin Nausea Only and Other (See Comments)    Intense headache  . Codeine Itching  . Iodine-131 Other (See Comments)    Passed out  . Simvastatin Other (See Comments)    Edema  . Statins Other (See Comments)    Sig edema on simvastatin    Current Outpatient Medications  Medication Sig Dispense Refill  . aspirin EC 81 MG tablet Take 81 mg by mouth daily. Swallow whole.    . clopidogrel (PLAVIX) 75 MG tablet TAKE 1 TABLET DAILY WITH BREAKFAST 90 tablet 3  . cyanocobalamin (,VITAMIN B-12,) 1000 MCG/ML injection AT THE START OF THERAPY INJECT WEEKLY FOR 4 WEEKS AND THEN MONTHLY THEREAFTER, FOR VITAMIN B12 DEFICIENCY 3 mL 3  . fenofibrate 160 MG tablet Take 1 tablet (160  mg total) by mouth daily. 90 tablet 3  . gabapentin (NEURONTIN) 600 MG tablet Take 1 tablet (600 mg total) by mouth at bedtime.    Marland Kitchen levothyroxine (SYNTHROID) 100 MCG tablet TAKE 1 TABLET DAILY BEFORE BREAKFAST 90 tablet 1  . melatonin 3 MG TABS tablet Take 3 mg by mouth at bedtime as needed (sleep.).    Marland Kitchen metoprolol tartrate (LOPRESSOR) 25 MG tablet TAKE ONE-HALF (1/2) TABLET TWICE A DAY 90 tablet 3  . pantoprazole (PROTONIX) 40 MG tablet TAKE 1 TABLET DAILY 90 tablet 3  . REPATHA SURECLICK 229 MG/ML SOAJ INJECT 1 PEN INTO THE SKIN EVERY 14 DAYS 6 mL 3  . rosuvastatin (CRESTOR)  20 MG tablet TAKE 1 TABLET DAILY 90 tablet 3  . SYRINGE-NEEDLE, DISP, 3 ML 25G X 1" 3 ML MISC Patient is to use to self inject 1 ml of vitamin B12 per provider instructions. 10 each 1  . tamsulosin (FLOMAX) 0.4 MG CAPS capsule TAKE 1 CAPSULE DAILY 90 capsule 3  . traMADol (ULTRAM) 50 MG tablet Take 1 tablet (50 mg total) by mouth every 6 (six) hours as needed. 12 tablet 0  . nitroGLYCERIN (NITROSTAT) 0.4 MG SL tablet Place 1 tablet (0.4 mg total) under the tongue every 5 (five) minutes as needed for chest pain (do not exceed 3 pills during one episode). (Patient not taking: Reported on 11/13/2019) 25 tablet 6   No current facility-administered medications for this visit.    REVIEW OF SYSTEMS:  [X]  denotes positive finding, [ ]  denotes negative finding Cardiac  Comments:  Chest pain or chest pressure:    Shortness of breath upon exertion:    Short of breath when lying flat:    Irregular heart rhythm:        Vascular    Pain in calf, thigh, or hip brought on by ambulation:    Pain in feet at night that wakes you up from your sleep:     Blood clot in your veins:    Leg swelling:         Pulmonary    Oxygen at home:    Productive cough:     Wheezing:         Neurologic    Sudden weakness in arms or legs:     Sudden numbness in arms or legs:     Sudden onset of difficulty speaking or slurred speech:      Temporary loss of vision in one eye:     Problems with dizziness:         Gastrointestinal    Blood in stool:     Vomited blood:         Genitourinary    Burning when urinating:     Blood in urine:        Psychiatric    Major depression:         Hematologic    Bleeding problems:    Problems with blood clotting too easily:        Skin    Rashes or ulcers:        Constitutional    Fever or chills:      PHYSICAL EXAM: Vitals:   11/13/19 0856 11/13/19 0858  BP: 119/70 104/78  Pulse: (!) 57 (!) 57  Resp: 16   Temp: 97.9 F (36.6 C)   TempSrc: Temporal   SpO2: 98%   Weight: 194 lb (88 kg)   Height: 5\' 7"  (1.702 m)     GENERAL: The patient is a well-nourished male, in no acute distress. The vital signs are documented above. CARDIAC: There is a regular rate and rhythm.  VASCULAR:  Right neck incision healed without issue, no hematoma PULMONARY: There is good air exchange bilaterally without wheezing or rales. ABDOMEN: Soft and non-tender with normal pitched bowel sounds.  MUSCULOSKELETAL: There are no major deformities or cyanosis. NEUROLOGIC: No focal weakness or paresthesias are detected.  CN II-XII grossly intact.   DATA:   Carotid duplex today shows patent right carotid stent with no stenosis.  Assessment/Plan:  64 year old male status post right TCAR on 10/15/2019 for an asymptomatic high-grade stenosis greater than 80% in the setting of previous neck radiation.  Carotid duplex today shows patent right carotid stent.  He has recovered well form surgery.  Discussed continue with aspirin statin Plavix from my standpoint for stent patency.  I will see him in 9 months with carotid duplex for ongoing surveillance.  He knows to call with questions or concerns.   [name], MD [facility] Office: [phone]

## 2019-11-16 ENCOUNTER — Other Ambulatory Visit: Payer: Self-pay | Admitting: *Deleted

## 2019-11-16 DIAGNOSIS — Z48812 Encounter for surgical aftercare following surgery on the circulatory system: Secondary | ICD-10-CM | POA: Diagnosis not present

## 2019-11-16 DIAGNOSIS — I779 Disorder of arteries and arterioles, unspecified: Secondary | ICD-10-CM

## 2019-11-29 DIAGNOSIS — R519 Headache, unspecified: Secondary | ICD-10-CM | POA: Diagnosis not present

## 2019-12-11 ENCOUNTER — Other Ambulatory Visit: Payer: Self-pay | Admitting: Cardiovascular Disease

## 2019-12-24 ENCOUNTER — Other Ambulatory Visit: Payer: Self-pay | Admitting: Family Medicine

## 2019-12-27 ENCOUNTER — Encounter: Payer: Self-pay | Admitting: Family Medicine

## 2019-12-27 ENCOUNTER — Other Ambulatory Visit: Payer: Self-pay

## 2019-12-27 ENCOUNTER — Ambulatory Visit (INDEPENDENT_AMBULATORY_CARE_PROVIDER_SITE_OTHER): Payer: Medicare Other | Admitting: Family Medicine

## 2019-12-27 VITALS — BP 146/80 | HR 68 | Temp 96.3°F | Ht 67.0 in | Wt 198.4 lb

## 2019-12-27 DIAGNOSIS — I6521 Occlusion and stenosis of right carotid artery: Secondary | ICD-10-CM

## 2019-12-27 DIAGNOSIS — Z87438 Personal history of other diseases of male genital organs: Secondary | ICD-10-CM

## 2019-12-27 DIAGNOSIS — Z23 Encounter for immunization: Secondary | ICD-10-CM | POA: Diagnosis not present

## 2019-12-27 LAB — POC URINALSYSI DIPSTICK (AUTOMATED)
Bilirubin, UA: NEGATIVE
Blood, UA: NEGATIVE
Glucose, UA: NEGATIVE
Ketones, UA: NEGATIVE
Leukocytes, UA: NEGATIVE
Nitrite, UA: NEGATIVE
Protein, UA: POSITIVE — AB
Spec Grav, UA: 1.02 (ref 1.010–1.025)
Urobilinogen, UA: 1 E.U./dL
pH, UA: 6 (ref 5.0–8.0)

## 2019-12-27 LAB — URINALYSIS, MICROSCOPIC ONLY
RBC / HPF: NONE SEEN (ref 0–?)
WBC, UA: NONE SEEN (ref 0–?)

## 2019-12-27 NOTE — Progress Notes (Signed)
This visit occurred during the SARS-CoV-2 public health emergency.  Safety protocols were in place, including screening questions prior to the visit, additional usage of staff PPE, and extensive cleaning of exam room while observing appropriate contact time as indicated for disinfecting solutions.  His isn't SOB.  D/w pt.  He had carotid stent placed.  That went well.  Brown semen, not bloody.  Noted only once.  No FCNAVD.  No testicle pain.  No dysuria.  No blood in stools.  Already on flomax.  He had dec in libido chronically but no ED.    He had covid booster.  Flu shot today.    Meds, vitals, and allergies reviewed.   ROS: Per HPI unless specifically indicated in ROS section   nad ncat Neck supple, no LA rrr ctab Testes bilaterally descended without nodularity, tenderness or masses. No scrotal masses or lesions. No penis lesions or urethral discharge.

## 2019-12-27 NOTE — Patient Instructions (Signed)
Urine check on the way out today.  We'll update you about that.  Flu shot today.  Take care.  Glad to see you.

## 2019-12-28 ENCOUNTER — Other Ambulatory Visit: Payer: Self-pay | Admitting: Family Medicine

## 2019-12-30 DIAGNOSIS — Z87438 Personal history of other diseases of male genital organs: Secondary | ICD-10-CM | POA: Insufficient documentation

## 2019-12-30 NOTE — Assessment & Plan Note (Addendum)
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  Discussed with patient.  Benign exam.  No residual symptoms now.  If he has recurrent symptoms he can let me know.  Discussed that if this is hematospermia, this is almost always a benign condition.  I do not see reason for alarm at this point.  It could be that aspirin and Plavix may increase his risk of this happening but that does not necessarily mean there is an ominous process.  Reasonable to check his urine today, see notes on labs.  He will update me as needed.  We can refer to urology in the future if he has recurrent symptoms, he agrees plan.

## 2020-01-01 DIAGNOSIS — L720 Epidermal cyst: Secondary | ICD-10-CM | POA: Diagnosis not present

## 2020-01-01 DIAGNOSIS — L814 Other melanin hyperpigmentation: Secondary | ICD-10-CM | POA: Diagnosis not present

## 2020-01-01 DIAGNOSIS — L570 Actinic keratosis: Secondary | ICD-10-CM | POA: Diagnosis not present

## 2020-01-01 DIAGNOSIS — L820 Inflamed seborrheic keratosis: Secondary | ICD-10-CM | POA: Diagnosis not present

## 2020-01-01 DIAGNOSIS — L821 Other seborrheic keratosis: Secondary | ICD-10-CM | POA: Diagnosis not present

## 2020-01-01 DIAGNOSIS — D225 Melanocytic nevi of trunk: Secondary | ICD-10-CM | POA: Diagnosis not present

## 2020-01-01 DIAGNOSIS — D1801 Hemangioma of skin and subcutaneous tissue: Secondary | ICD-10-CM | POA: Diagnosis not present

## 2020-01-17 DIAGNOSIS — R519 Headache, unspecified: Secondary | ICD-10-CM | POA: Diagnosis not present

## 2020-01-17 DIAGNOSIS — G518 Other disorders of facial nerve: Secondary | ICD-10-CM | POA: Diagnosis not present

## 2020-01-17 DIAGNOSIS — M542 Cervicalgia: Secondary | ICD-10-CM | POA: Diagnosis not present

## 2020-01-17 DIAGNOSIS — M791 Myalgia, unspecified site: Secondary | ICD-10-CM | POA: Diagnosis not present

## 2020-01-24 ENCOUNTER — Emergency Department (HOSPITAL_COMMUNITY): Payer: Medicare Other

## 2020-01-24 ENCOUNTER — Other Ambulatory Visit: Payer: Self-pay

## 2020-01-24 ENCOUNTER — Observation Stay (HOSPITAL_COMMUNITY)
Admission: EM | Admit: 2020-01-24 | Discharge: 2020-01-25 | Disposition: A | Discharge status: Home / Self Care | Payer: Medicare Other | Attending: Internal Medicine | Admitting: Internal Medicine

## 2020-01-24 DIAGNOSIS — E039 Hypothyroidism, unspecified: Secondary | ICD-10-CM | POA: Diagnosis not present

## 2020-01-24 DIAGNOSIS — Z8521 Personal history of malignant neoplasm of larynx: Secondary | ICD-10-CM | POA: Diagnosis not present

## 2020-01-24 DIAGNOSIS — R61 Generalized hyperhidrosis: Secondary | ICD-10-CM | POA: Diagnosis not present

## 2020-01-24 DIAGNOSIS — Z79899 Other long term (current) drug therapy: Secondary | ICD-10-CM | POA: Diagnosis not present

## 2020-01-24 DIAGNOSIS — Z20822 Contact with and (suspected) exposure to covid-19: Secondary | ICD-10-CM | POA: Diagnosis not present

## 2020-01-24 DIAGNOSIS — Z955 Presence of coronary angioplasty implant and graft: Secondary | ICD-10-CM | POA: Diagnosis not present

## 2020-01-24 DIAGNOSIS — R1013 Epigastric pain: Secondary | ICD-10-CM | POA: Diagnosis not present

## 2020-01-24 DIAGNOSIS — I130 Hypertensive heart and chronic kidney disease with heart failure and stage 1 through stage 4 chronic kidney disease, or unspecified chronic kidney disease: Secondary | ICD-10-CM | POA: Insufficient documentation

## 2020-01-24 DIAGNOSIS — Z87891 Personal history of nicotine dependence: Secondary | ICD-10-CM | POA: Insufficient documentation

## 2020-01-24 DIAGNOSIS — R110 Nausea: Secondary | ICD-10-CM | POA: Diagnosis not present

## 2020-01-24 DIAGNOSIS — I723 Aneurysm of iliac artery: Secondary | ICD-10-CM | POA: Diagnosis not present

## 2020-01-24 DIAGNOSIS — N189 Chronic kidney disease, unspecified: Secondary | ICD-10-CM | POA: Insufficient documentation

## 2020-01-24 DIAGNOSIS — Z7901 Long term (current) use of anticoagulants: Secondary | ICD-10-CM | POA: Diagnosis not present

## 2020-01-24 DIAGNOSIS — Z7982 Long term (current) use of aspirin: Secondary | ICD-10-CM | POA: Diagnosis not present

## 2020-01-24 DIAGNOSIS — I10 Essential (primary) hypertension: Secondary | ICD-10-CM | POA: Diagnosis not present

## 2020-01-24 DIAGNOSIS — R0902 Hypoxemia: Secondary | ICD-10-CM | POA: Diagnosis not present

## 2020-01-24 DIAGNOSIS — R079 Chest pain, unspecified: Secondary | ICD-10-CM | POA: Diagnosis not present

## 2020-01-24 DIAGNOSIS — I774 Celiac artery compression syndrome: Secondary | ICD-10-CM | POA: Diagnosis not present

## 2020-01-24 DIAGNOSIS — R072 Precordial pain: Secondary | ICD-10-CM | POA: Diagnosis not present

## 2020-01-24 DIAGNOSIS — R0789 Other chest pain: Secondary | ICD-10-CM | POA: Diagnosis not present

## 2020-01-24 DIAGNOSIS — I2510 Atherosclerotic heart disease of native coronary artery without angina pectoris: Secondary | ICD-10-CM | POA: Insufficient documentation

## 2020-01-24 DIAGNOSIS — I708 Atherosclerosis of other arteries: Secondary | ICD-10-CM | POA: Diagnosis not present

## 2020-01-24 DIAGNOSIS — I5032 Chronic diastolic (congestive) heart failure: Secondary | ICD-10-CM | POA: Diagnosis not present

## 2020-01-24 DIAGNOSIS — S2242XA Multiple fractures of ribs, left side, initial encounter for closed fracture: Secondary | ICD-10-CM | POA: Diagnosis not present

## 2020-01-24 LAB — URINALYSIS, ROUTINE W REFLEX MICROSCOPIC
Bilirubin Urine: NEGATIVE
Glucose, UA: NEGATIVE mg/dL
Hgb urine dipstick: NEGATIVE
Ketones, ur: NEGATIVE mg/dL
Leukocytes,Ua: NEGATIVE
Nitrite: NEGATIVE
Protein, ur: >=300 mg/dL — AB
Specific Gravity, Urine: 1.017 (ref 1.005–1.030)
pH: 5 (ref 5.0–8.0)

## 2020-01-24 LAB — CBC WITH DIFFERENTIAL/PLATELET
Abs Immature Granulocytes: 0.04 10*3/uL (ref 0.00–0.07)
Basophils Absolute: 0.1 10*3/uL (ref 0.0–0.1)
Basophils Relative: 1 %
Eosinophils Absolute: 0.1 10*3/uL (ref 0.0–0.5)
Eosinophils Relative: 2 %
HCT: 43.3 % (ref 39.0–52.0)
Hemoglobin: 14.1 g/dL (ref 13.0–17.0)
Immature Granulocytes: 1 %
Lymphocytes Relative: 23 %
Lymphs Abs: 1.3 10*3/uL (ref 0.7–4.0)
MCH: 30.5 pg (ref 26.0–34.0)
MCHC: 32.6 g/dL (ref 30.0–36.0)
MCV: 93.7 fL (ref 80.0–100.0)
Monocytes Absolute: 0.5 10*3/uL (ref 0.1–1.0)
Monocytes Relative: 9 %
Neutro Abs: 3.7 10*3/uL (ref 1.7–7.7)
Neutrophils Relative %: 64 %
Platelets: 276 10*3/uL (ref 150–400)
RBC: 4.62 MIL/uL (ref 4.22–5.81)
RDW: 12.7 % (ref 11.5–15.5)
WBC: 5.8 10*3/uL (ref 4.0–10.5)
nRBC: 0 % (ref 0.0–0.2)

## 2020-01-24 LAB — COMPREHENSIVE METABOLIC PANEL
ALT: 12 U/L (ref 0–44)
AST: 20 U/L (ref 15–41)
Albumin: 3.4 g/dL — ABNORMAL LOW (ref 3.5–5.0)
Alkaline Phosphatase: 34 U/L — ABNORMAL LOW (ref 38–126)
Anion gap: 10 (ref 5–15)
BUN: 19 mg/dL (ref 8–23)
CO2: 23 mmol/L (ref 22–32)
Calcium: 8.9 mg/dL (ref 8.9–10.3)
Chloride: 106 mmol/L (ref 98–111)
Creatinine, Ser: 1.77 mg/dL — ABNORMAL HIGH (ref 0.61–1.24)
GFR, Estimated: 42 mL/min — ABNORMAL LOW (ref >60)
Glucose, Bld: 161 mg/dL — ABNORMAL HIGH (ref 70–99)
Potassium: 3.7 mmol/L (ref 3.5–5.1)
Sodium: 139 mmol/L (ref 135–145)
Total Bilirubin: 0.5 mg/dL (ref 0.3–1.2)
Total Protein: 6.1 g/dL — ABNORMAL LOW (ref 6.5–8.1)

## 2020-01-24 LAB — TROPONIN I (HIGH SENSITIVITY)
Troponin I (High Sensitivity): 6 ng/L (ref <18)
Troponin I (High Sensitivity): 13 ng/L (ref <18)

## 2020-01-24 LAB — D-DIMER, QUANTITATIVE: D-Dimer, Quant: 0.96 ug/mL-FEU — ABNORMAL HIGH (ref 0.00–0.50)

## 2020-01-24 LAB — LIPASE, BLOOD: Lipase: 34 U/L (ref 11–51)

## 2020-01-24 MED ORDER — HYDROCORTISONE NA SUCCINATE PF 100 MG IJ SOLR
200.0000 mg | Freq: Once | INTRAMUSCULAR | Status: AC
  Administered 2020-01-24: 200 mg via INTRAVENOUS
  Filled 2020-01-24: qty 4

## 2020-01-24 MED ORDER — MORPHINE SULFATE (PF) 4 MG/ML IV SOLN
4.0000 mg | Freq: Once | INTRAVENOUS | Status: AC
  Administered 2020-01-24: 4 mg via INTRAVENOUS
  Filled 2020-01-24: qty 1

## 2020-01-24 MED ORDER — SODIUM CHLORIDE 0.9 % IV BOLUS
1000.0000 mL | Freq: Once | INTRAVENOUS | Status: AC
  Administered 2020-01-24: 1000 mL via INTRAVENOUS

## 2020-01-24 MED ORDER — DIPHENHYDRAMINE HCL 25 MG PO CAPS
50.0000 mg | ORAL_CAPSULE | Freq: Once | ORAL | Status: AC
  Administered 2020-01-24: 50 mg via ORAL
  Filled 2020-01-24: qty 2

## 2020-01-24 MED ORDER — ONDANSETRON HCL 4 MG/2ML IJ SOLN
4.0000 mg | Freq: Once | INTRAMUSCULAR | Status: AC
  Administered 2020-01-24: 4 mg via INTRAVENOUS
  Filled 2020-01-24: qty 2

## 2020-01-24 MED ORDER — DIPHENHYDRAMINE HCL 50 MG/ML IJ SOLN: 50.0000 mg | Freq: Once | INTRAMUSCULAR | Status: AC

## 2020-01-24 MED ORDER — HYDROMORPHONE HCL 1 MG/ML IJ SOLN
1.0000 mg | Freq: Once | INTRAMUSCULAR | Status: AC
  Administered 2020-01-24: 1 mg via INTRAVENOUS
  Filled 2020-01-24: qty 1

## 2020-01-24 MED ORDER — IOHEXOL 350 MG/ML SOLN
100.0000 mL | Freq: Once | INTRAVENOUS | Status: AC | PRN
  Administered 2020-01-24: 100 mL via INTRAVENOUS

## 2020-01-24 NOTE — ED Provider Notes (Signed)
[facility] EMERGENCY DEPARTMENT Provider Note   CSN: [id number] Arrival date & time: 01/24/20  1647     History No chief complaint on file.   Tony Hunt is a 64 y.o. male.   Chest Pain Pain location:  Substernal area Pain quality: sharp   Pain radiates to:  L shoulder Pain severity:  Moderate Onset quality:  Sudden Timing:  Constant Progression:  Unchanged Chronicity:  New Context: at rest   Relieved by:  Nothing Worsened by:  Nothing Ineffective treatments:  Nitroglycerin Associated symptoms: diaphoresis and nausea   Associated symptoms: no abdominal pain, no back pain, no cough, no fever, no headache, no palpitations, no shortness of breath and no vomiting   Risk factors: coronary artery disease        Past Medical History:  Diagnosis Date  . AAA (abdominal aortic aneurysm) without rupture ([facility]) 02/14/2014   [place] 1/19:  AAA 3 cm  . ALCOHOL ABUSE, HX OF    distant history  . Anxiety    occ panic sx, increased after death of mother  . ANXIETY DEPRESSION 05/10/2007   history of, during difficult relationship  . CAD (coronary artery disease) 08/28/2012   08/25/2012 Successful PTCA/DES x mid LAD // LAD prox 30, mid stent patent; D1 40; LCx ok; OM2 50, 50; RCA prox 20; EF 55-65  // Nuclear stress test 9/19: EF 58, small inferobasal infarct, no ischemia, Low Risk (inf defect ? diaph atten?)   . Cancer ([facility]) 03/2010   tumor on larynx/tx radiation  . Carotid artery disease ([place]) 09/12/2017   [place] 6/19:  R 60-79; L 1-39; R vertebral occluded; FU 12 months  . 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 Chronic diastolic CHF (congestive heart failure) ([place]) 12/21/2017   Echo 11/17/2017 - Mild concentric LVH, EF 65-70, normal wall motion, grade 2 diastolic dysfunction, mild LAE, normal RVSF, trivial TR  . CKD (chronic kidney disease)    Nephrologist is Dr. Corliss Parish (10/03/19)  . Dilatation of  aorta ([facility]) 02/14/2014  . Diverticulosis   . ESOPHAGEAL STRICTURE 05/10/2007  . GERD 05/10/2007  . Heart disease   . HIATAL HERNIA 05/10/2007  . History of echocardiogram    Echo 9/19: mild conc LVH, vigorous LVF, EF 65-70, Gr 2 DD, mild LAE, normal RVSF, trivial AI  . History of radiation therapy   . HYPERLIPIDEMIA 05/10/2007  . HYPERTENSION 05/10/2007  . 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 SLEEP APNEA 05/10/2007   lost 100lb-not now  . Upper airway cough syndrome    Per Dr. Melvyn Novas, pulmonary     Patient Active Problem List   Diagnosis Date Noted  . H/O abnormal semen 12/30/2019  . Post-operative state 10/15/2019  . SOB (shortness of breath) 10/11/2019  . Gallstones 08/05/2019  . Hematochezia 05/17/2019  . Abdominal pain, epigastric 05/17/2019  . B12 deficiency 11/06/2018  . Paresthesia 08/02/2018  . Chronic diastolic CHF (congestive heart failure) ([place]) 12/21/2017  . Health care maintenance 11/07/2017  . Advance care planning 11/07/2017  . 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 Carotid artery disease ([place]) 09/12/2017  . Migraine with aura 03/31/2017  . Bloating 11/18/2016  . Left carotid bruit 05/26/2016  . Lower urinary tract symptoms (LUTS) 04/16/2016  . Sinus bradycardia 12/15/2015  . Neck pain 02/05/2015  . AAA (abdominal aortic aneurysm) without rupture ([place]) 02/14/2014  . Lower abdominal pain 12/23/2013  . Medicare annual wellness visit, subsequent 07/03/2013  .  Rash 01/08/2013  . Gout 12/21/2012  . CAD (coronary artery disease) 08/28/2012  . Hypothyroidism 08/26/2012  . Panic 03/07/2012  . 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 COUGH 02/19/2010  . Hyperlipidemia 05/10/2007  . ANXIETY DEPRESSION 05/10/2007  . 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 Essential hypertension 05/10/2007  . ESOPHAGEAL STRICTURE 05/10/2007  . GERD 05/10/2007  . 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 SLEEP APNEA 05/10/2007  . ALCOHOL ABUSE,  HX OF 05/10/2007    Past Surgical History:  Procedure Laterality Date  . APPENDECTOMY    . BACK SURGERY  1997   lower back x3  . CARPOMETACARPAL ([facility]) FUSION OF THUMB Left 04/02/2014   Procedure: CARPOMETACARPAL ([place]) FUSION OF THUMB/LEFT THUMB INTERPHALNGEAL JOINT FUSION;  Surgeon: Jolyn Nap, MD;  Location: [facility];  Service: Orthopedics;  Laterality: Left;  . COLONOSCOPY    . CORONARY STENT PLACEMENT     2.75 x 12 mm Promus Premier DES was deployed in the mid LAD. The stent was post-dilated with a 3.0 x 9 mm Brumley balloon - Mid LAD.  Marland Kitchen LARYNGOSCOPY / BRONCHOSCOPY / ESOPHAGOSCOPY  2012   bx  . LEFT HEART CATH AND CORONARY ANGIOGRAPHY N/A 05/28/2016   Procedure: Left Heart Cath and Coronary Angiography;  Surgeon: Belva Crome, MD;  Location: [facility];  Service: Cardiovascular;  Laterality: N/A;  . LEFT HEART CATHETERIZATION WITH CORONARY ANGIOGRAM N/A 08/25/2012   Procedure: LEFT HEART CATHETERIZATION WITH CORONARY ANGIOGRAM;  Surgeon: Burnell Blanks, MD;  Location: [facility] CATH LAB;  Service: Cardiovascular;  Laterality: N/A;  . LEFT HEART CATHETERIZATION WITH CORONARY ANGIOGRAM N/A 11/23/2013   Procedure: LEFT HEART CATHETERIZATION WITH CORONARY ANGIOGRAM;  Surgeon: Jettie Booze, MD;  Location: [facility] CATH LAB;  Service: Cardiovascular;  Laterality: N/A;  . TRANSCAROTID ARTERY REVASCULARIZATION Right 10/15/2019   Procedure: RIGHT TRANSCAROTID ARTERY REVASCULARIZATION;  Surgeon: [name], MD;  Location: [facility];  Service: Vascular;  Laterality: Right;  . ULTRASOUND GUIDANCE FOR VASCULAR ACCESS Left 10/15/2019   Procedure: ULTRASOUND GUIDANCE FOR VASCULAR ACCESS;  Surgeon: [name], MD;  Location: [facility];  Service: Vascular;  Laterality: Left;  . WRIST FRACTURE SURGERY  08/21/10   Right wrist x4       Family History  Problem Relation Age of Onset  . Brain cancer Father   . Dementia Father        h/o brain tumor  . Heart disease  Mother   . Hypertension Mother   . Heart disease Brother   . Hypertension Brother   . Hyperlipidemia Brother   . Colon polyps Brother   . Colon cancer Neg Hx   . Prostate cancer Neg Hx   . Esophageal cancer Neg Hx   . Pancreatic cancer Neg Hx   . Liver disease Neg Hx   . Throat cancer Neg Hx     Social History   Tobacco Use  . Smoking status: Former Smoker    Quit date: 09/30/2010    Years since quitting: 9.3  . Smokeless tobacco: Former Network engineer  . Vaping Use: Never used  Substance Use Topics  . Alcohol use: Yes    Comment: occ   . Drug use: No    Home Medications Prior to Admission medications   Medication Sig Start Date End Date Taking? Authorizing Provider  aspirin EC 81 MG tablet Take 81 mg by mouth daily. Swallow whole.   Yes [provider]  clopidogrel (  PLAVIX) 75 MG tablet TAKE 1 TABLET DAILY WITH BREAKFAST Patient taking differently: Take 75 mg by mouth See admin instructions. Take 75 mg by mouth in the morning with breakfast and should NOT be held prior to cath procedure unless specifically ordered 02/28/19  Yes Nahser, Wonda Cheng, MD  cyanocobalamin (,VITAMIN B-12,) 1000 MCG/ML injection AT THE START OF THERAPY INJECT WEEKLY FOR 4 WEEKS AND THEN MONTHLY THEREAFTER, FOR VITAMIN B12 DEFICIENCY Patient taking differently: Inject 1,000 mcg into the skin every 30 (thirty) days.  10/30/19  Yes Tonia Ghent, MD  fenofibrate 160 MG tablet TAKE 1 TABLET DAILY Patient taking differently: Take 160 mg by mouth at bedtime.  12/12/19  Yes Nahser, Wonda Cheng, MD  gabapentin (NEURONTIN) 100 MG capsule Take 100 mg by mouth daily as needed (for headaches).   Yes [provider]  gabapentin (NEURONTIN) 600 MG tablet Take 1 tablet (600 mg total) by mouth at bedtime. 04/10/18  Yes Tonia Ghent, MD  levothyroxine (SYNTHROID) 100 MCG tablet TAKE 1 TABLET DAILY BEFORE BREAKFAST Patient taking differently: Take 100 mcg by mouth daily before breakfast.  10/23/19   Yes Tonia Ghent, MD  melatonin 3 MG TABS tablet Take 3 mg by mouth at bedtime as needed (for sleep).    Yes [provider]  metoprolol tartrate (LOPRESSOR) 25 MG tablet TAKE ONE-HALF (1/2) TABLET TWICE A DAY Patient taking differently: Take 12.5 mg by mouth 2 (two) times daily.  03/05/19  Yes Weaver, Scott T, PA-C  nitroGLYCERIN (NITROSTAT) 0.4 MG SL tablet Place 1 tablet (0.4 mg total) under the tongue every 5 (five) minutes as needed for chest pain (do not exceed 3 pills during one episode). Patient taking differently: Place 0.4 mg under the tongue every 5 (five) minutes as needed for chest pain ("and do not exceed 3 pills during one episode").  09/13/17  Yes Weaver, Scott T, PA-C  pantoprazole (PROTONIX) 40 MG tablet TAKE 1 TABLET DAILY Patient taking differently: Take 40 mg by mouth daily before breakfast.  05/21/19  Yes Tonia Ghent, MD  REPATHA SURECLICK 263 MG/ML SOAJ INJECT 1 PEN INTO THE SKIN EVERY 14 DAYS Patient taking differently: Inject 140 mg into the vein every 14 (fourteen) days.  10/02/19  Yes Nahser, Wonda Cheng, MD  rosuvastatin (CRESTOR) 20 MG tablet TAKE 1 TABLET DAILY Patient taking differently: Take 20 mg by mouth at bedtime.  12/24/19  Yes Tonia Ghent, MD  tamsulosin (FLOMAX) 0.4 MG CAPS capsule TAKE 1 CAPSULE DAILY Patient taking differently: Take 0.4 mg by mouth daily.  12/28/19  Yes Tonia Ghent, MD  traMADol (ULTRAM) 50 MG tablet Take 1 tablet (50 mg total) by mouth every 6 (six) hours as needed. Patient taking differently: Take 50 mg by mouth every 6 (six) hours as needed (for pain).  10/16/19 10/15/20 Yes Baglia, Corrina, PA-C  SYRINGE-NEEDLE, DISP, 3 ML 25G X 1" 3 ML MISC Patient is to use to self inject 1 ml of vitamin B12 per provider instructions. 02/12/19   Tonia Ghent, MD    Allergies    Imdur [isosorbide dinitrate], Iodine-131, Iohexol, Lisinopril, Pravastatin, Codeine, Simvastatin, and Statins  Review of Systems   Review of Systems    Constitutional: Positive for diaphoresis. Negative for chills and fever.  HENT: Negative for congestion and rhinorrhea.   Respiratory: Negative for cough and shortness of breath.   Cardiovascular: Positive for chest pain. Negative for palpitations.  Gastrointestinal: Positive for nausea. Negative for abdominal pain, diarrhea and  vomiting.  Genitourinary: Negative for difficulty urinating and dysuria.  Musculoskeletal: Negative for arthralgias and back pain.  Skin: Negative for color change and rash.  Neurological: Negative for light-headedness and headaches.    Physical Exam Updated Vital Signs BP (!) 135/97   Pulse 83   Temp (!) 97.5 F (36.4 C) (Oral)   Resp 19   Ht 5\' 7"  (1.702 m)   Wt 89.8 kg   SpO2 96%   BMI 31.01 kg/m   Physical Exam Vitals and nursing note reviewed.  Constitutional:      General: He is not in acute distress.    Appearance: Normal appearance.  HENT:     Head: Normocephalic and atraumatic.     Nose: No rhinorrhea.  Eyes:     General:        Right eye: No discharge.        Left eye: No discharge.     Conjunctiva/sclera: Conjunctivae normal.  Cardiovascular:     Rate and Rhythm: Normal rate and regular rhythm.     Heart sounds: No murmur heard.   Pulmonary:     Effort: Pulmonary effort is normal.     Breath sounds: No stridor.  Abdominal:     General: Abdomen is flat. There is no distension.     Palpations: Abdomen is soft.     Tenderness: There is abdominal tenderness (mild epigastric).  Musculoskeletal:        General: No deformity or signs of injury.     Right lower leg: No edema.     Left lower leg: No edema.  Skin:    General: Skin is warm and dry.  Neurological:     General: No focal deficit present.     Mental Status: He is alert. Mental status is at baseline.     Motor: No weakness.  Psychiatric:        Mood and Affect: Mood normal.        Behavior: Behavior normal.        Thought Content: Thought content normal.     ED  Results / Procedures / Treatments   Labs (all labs ordered are listed, but only abnormal results are displayed) Labs Reviewed  COMPREHENSIVE METABOLIC PANEL - Abnormal; Notable for the following components:      Result Value   Glucose, Bld 161 (*)    Creatinine, Ser 1.77 (*)    Total Protein 6.1 (*)    Albumin 3.4 (*)    Alkaline Phosphatase 34 (*)    GFR, Estimated 42 (*)    All other components within normal limits  D-DIMER, QUANTITATIVE (NOT AT [facility]) - Abnormal; Notable for the following components:   D-Dimer, Quant 0.96 (*)    All other components within normal limits  URINALYSIS, ROUTINE W REFLEX MICROSCOPIC - Abnormal; Notable for the following components:   Protein, ur >=300 (*)    Bacteria, UA RARE (*)    All other components within normal limits  CBC WITH DIFFERENTIAL/PLATELET  LIPASE, BLOOD  TROPONIN I (HIGH SENSITIVITY)  TROPONIN I (HIGH SENSITIVITY)  TROPONIN I (HIGH SENSITIVITY)    EKG EKG Interpretation  Date/Time:  Thursday January 24 2020 16:55:06 EST Ventricular Rate:  68 PR Interval:    QRS Duration: 93 QT Interval:  403 QTC Calculation: 429 R Axis:   -60 Text Interpretation: Sinus rhythm Short PR interval Probable left atrial enlargement Left anterior fascicular block Abnormal R-wave progression, late transition Confirmed by Dewaine Conger [phone]) on 01/24/2020 4:59:23 PM  Radiology DG Chest Portable 1 View  Result Date: 01/24/2020 CLINICAL DATA:  Chest pain central chest radiating to left arm. Arm tingling. EXAM: PORTABLE CHEST 1 VIEW.  Patient is tilted to the left. COMPARISON:  Chest x-ray 10/08/2019, CT chest 06/30/2011 FINDINGS: The heart size and mediastinal contours are unchanged. Coronary artery stent. No focal consolidation. No pulmonary edema. No pleural effusion. No pneumothorax. No acute osseous abnormality. IMPRESSION: No active disease. Electronically Signed   By: Iven Finn M.D.   On: 01/24/2020 17:32   CT Angio Chest/Abd/Pel for  Dissection W and/or Wo Contrast  Result Date: 01/24/2020 CLINICAL DATA:  Chest pain. Back pain. Sudden onset. Hypertension. Aortic dissection suspected. EXAM: CT ANGIOGRAPHY CHEST, ABDOMEN AND PELVIS TECHNIQUE: Non-contrast CT of the chest was initially obtained. Multidetector CT imaging through the chest, abdomen and pelvis was performed using the standard protocol during bolus administration of intravenous contrast. Multiplanar reconstructed images and MIPs were obtained and reviewed to evaluate the vascular anatomy. CONTRAST:  140mL OMNIPAQUE IOHEXOL 350 MG/ML SOLN COMPARISON:  CT of the abdomen and pelvis 3/15/1. CTA of the neck 09/07/19 FINDINGS: CTA CHEST FINDINGS Cardiovascular: Heart size is normal. Coronary artery calcifications are present. Atherosclerotic calcifications are present in the aortic arch and great vessel origins without focal stenosis or change. Extensive atherosclerotic changes are present in the descending thoracic aorta with circumferential mucosal plaque. No aneurysm is present. No dissection is present. Pulmonary arteries are within normal limits. Mediastinum/Nodes: No significant mediastinal, hilar, or axillary adenopathy is present. Thoracic inlet is within limits. Esophagus is unremarkable. Lungs/Pleura: Mild dependent atelectasis is present bilaterally. No nodule mass lesion is present. No significant pleural effusion is present. Musculoskeletal: Remote posterior left-sided rib fractures are present. No acute abnormalities are present. Vertebral body heights and alignment are maintained. Leftward curvature is present in the thoracic spine. The sternum is within normal limits. No focal lytic or blastic lesions are present. Review of the MIP images confirms the above findings. CTA ABDOMEN AND PELVIS FINDINGS VASCULAR Aorta: Diffuse atherosclerotic changes are present. Plaque is present. Focal aneurysm, stenosis, or dissection is present. Celiac: Moderate stenosis is present proximal  celiac artery. Branch vessels are unremarkable. SMA: Patent without evidence of aneurysm, dissection, vasculitis or significant stenosis. Renals: Both renal arteries are patent without evidence of aneurysm, dissection, vasculitis, fibromuscular dysplasia or significant stenosis. IMA: Not visualized. Inflow: Extensive mural plaque is present on the left. Aneurysmal dilation measures up to 25 mm. Aneurysmal dilation of the right is 22 mm. Veins: No obvious venous abnormality within the limitations of this arterial phase study. Review of the MIP images confirms the above findings. NON-VASCULAR Hepatobiliary: Liver is unremarkable. Gallstone again noted at the neck of the gallbladder without associated inflammatory change. The common bile duct is within normal limits. Pancreas: Unremarkable. No pancreatic ductal dilatation or surrounding inflammatory changes. Spleen: Normal in size without focal abnormality. Adrenals/Urinary Tract: Mild parenchymal thinning is noted bilaterally. No stone or mass lesion is present. Ureters are unremarkable. The urinary bladder is within limits. Stomach/Bowel: Stomach and duodenum are within normal limits. Small bowel is unremarkable. Terminal ileum is within normal limits. Appendix is surgically absent. Ascending and transverse colon are within normal limits. The descending and sigmoid colon are within limits. Lymphatic: No significant retroperitoneal adenopathy is present. Reproductive: Prostate is unremarkable. Other: No abdominal wall hernia or abnormality. No abdominopelvic ascites. Musculoskeletal: Grade 2 anterolisthesis is present at L5-S1 measuring 15 mm. Slight retrolisthesis present at L3-4 and L4-5. No focal lytic or blastic lesions are  present. Bony pelvis is within normal limits. Hips are located and normal. Review of the MIP images confirms the above findings. IMPRESSION: 1. No aortic dissection. 2. Coronary artery disease. 3. 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 4. 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 5. Cholelithiasis without cholecystitis. 6. Grade 2 anterolisthesis at L5-S1 measuring 15 mm. 7. Aortic Atherosclerosis (ICD10-I70.0). Electronically Signed   By: San Morelle M.D.   On: 01/24/2020 23:02    Procedures Procedures (including critical care time)  Medications Ordered in ED Medications  sodium chloride 0.9 % bolus 1,000 mL (0 mLs Intravenous Stopped 01/24/20 1956)  morphine 4 MG/ML injection 4 mg (4 mg Intravenous Given 01/24/20 1717)  ondansetron (ZOFRAN) injection 4 mg (4 mg Intravenous Given 01/24/20 1718)  HYDROmorphone (DILAUDID) injection 1 mg (1 mg Intravenous Given 01/24/20 1831)  hydrocortisone sodium succinate (SOLU-CORTEF) 100 MG injection 200 mg (200 mg Intravenous Given 01/24/20 1832)  diphenhydrAMINE (BENADRYL) capsule 50 mg (50 mg Oral Given 01/24/20 2010)    Or  diphenhydrAMINE (BENADRYL) injection 50 mg ( Intravenous See Alternative 01/24/20 2010)  iohexol (OMNIPAQUE) 350 MG/ML injection 100 mL (100 mLs Intravenous Contrast Given 01/24/20 2247)    ED Course  I have reviewed the triage vital signs and the nursing notes.  Pertinent labs & imaging results that were available during my care of the patient were reviewed by me and considered in my medical decision making (see chart for details).    MDM Rules/Calculators/A&P                          Chest pain  Sudden onset sharp chest pain.  Hemodynamically stable here.  Nitroglycerin given with no effect.  EKG shows sinus rhythm without acute ischemic change significant interval abnormality or arrhythmia.  There is some motion artifact we will need repeat EKGs.  Cardiac biomarkers screening labs IV fluids antiemetics pain control be given.  Consider CT angiogram of chest patient has anaphylaxis to contrast and will need pretreatment.  We will discuss this with the patient.  CT angiogram was complete  patient had no adverse reaction.  He remains comfortable without chest pain.  Based on my review of his labs he has a delta troponin of 7, based on his restratification and hear score that does push him towards an observation stay for further provocative testing of his heart.  Other laboratory studies unremarkable.  CT imaging showed no dissection no other surgical complication.  He has gallstones but no imaging signs of cholecystitis nor does he have any right upper quadrant tenderness.  I have consulted the hospitalist for admission for further testing of this patient.  The patient will be admitted to the hospitalist.  For the remainder this patient's care please see inpatient team notes.  I will intervene as needed while the patient remains in the emergency department.   Final Clinical Impression(s) / ED Diagnoses Final diagnoses:  Chest pain, unspecified type    Rx / DC Orders ED Discharge Orders    None       Breck Coons, MD 01/24/20 2343

## 2020-01-24 NOTE — ED Triage Notes (Addendum)
BIB [facility] w/ complaints of chest pain that started today at 1500 hrs. Pt reports pain was a 10/10 central chest, radiating to the left arm, pt states he feels like the arm is tingling.  Pt given 2 SL nitro, 4 mg of morphine and 324 of ASA PTA w/ EMS. V/s per EMS 220/110 initially, HR-80s Spo2-100 % on RA. Pt reported initial relief with nitro and morphine but now reporting pain 10/10 at this time. Pt denies feeling SoB, pt reports dry heaving for the last hour with no emesis. Pt denies feeling dizzy or lightheaded.

## 2020-01-25 ENCOUNTER — Encounter (HOSPITAL_COMMUNITY): Payer: Self-pay | Admitting: Family Medicine

## 2020-01-25 ENCOUNTER — Other Ambulatory Visit: Payer: Self-pay

## 2020-01-25 ENCOUNTER — Observation Stay (HOSPITAL_BASED_OUTPATIENT_CLINIC_OR_DEPARTMENT_OTHER): Payer: Medicare Other

## 2020-01-25 DIAGNOSIS — R1013 Epigastric pain: Secondary | ICD-10-CM | POA: Diagnosis not present

## 2020-01-25 DIAGNOSIS — I5032 Chronic diastolic (congestive) heart failure: Secondary | ICD-10-CM | POA: Diagnosis not present

## 2020-01-25 DIAGNOSIS — N189 Chronic kidney disease, unspecified: Secondary | ICD-10-CM | POA: Diagnosis not present

## 2020-01-25 DIAGNOSIS — E785 Hyperlipidemia, unspecified: Secondary | ICD-10-CM

## 2020-01-25 DIAGNOSIS — I10 Essential (primary) hypertension: Secondary | ICD-10-CM | POA: Diagnosis not present

## 2020-01-25 DIAGNOSIS — R0789 Other chest pain: Secondary | ICD-10-CM | POA: Diagnosis not present

## 2020-01-25 DIAGNOSIS — I2510 Atherosclerotic heart disease of native coronary artery without angina pectoris: Secondary | ICD-10-CM | POA: Diagnosis not present

## 2020-01-25 DIAGNOSIS — R079 Chest pain, unspecified: Secondary | ICD-10-CM | POA: Diagnosis present

## 2020-01-25 DIAGNOSIS — R61 Generalized hyperhidrosis: Secondary | ICD-10-CM | POA: Diagnosis not present

## 2020-01-25 DIAGNOSIS — R072 Precordial pain: Secondary | ICD-10-CM | POA: Diagnosis not present

## 2020-01-25 DIAGNOSIS — Z8521 Personal history of malignant neoplasm of larynx: Secondary | ICD-10-CM | POA: Diagnosis not present

## 2020-01-25 DIAGNOSIS — R110 Nausea: Secondary | ICD-10-CM | POA: Diagnosis not present

## 2020-01-25 DIAGNOSIS — Z20822 Contact with and (suspected) exposure to covid-19: Secondary | ICD-10-CM | POA: Diagnosis not present

## 2020-01-25 DIAGNOSIS — Z955 Presence of coronary angioplasty implant and graft: Secondary | ICD-10-CM | POA: Diagnosis not present

## 2020-01-25 DIAGNOSIS — I130 Hypertensive heart and chronic kidney disease with heart failure and stage 1 through stage 4 chronic kidney disease, or unspecified chronic kidney disease: Secondary | ICD-10-CM | POA: Diagnosis not present

## 2020-01-25 DIAGNOSIS — E039 Hypothyroidism, unspecified: Secondary | ICD-10-CM | POA: Diagnosis not present

## 2020-01-25 LAB — ECHOCARDIOGRAM COMPLETE
Area-P 1/2: 2.8 cm2
Height: 67 in
S' Lateral: 3.3 cm
Weight: 3211.2 oz

## 2020-01-25 LAB — TROPONIN I (HIGH SENSITIVITY): Troponin I (High Sensitivity): 12 ng/L (ref <18)

## 2020-01-25 LAB — RESP PANEL BY RT-PCR (FLU A&B, COVID) ARPGX2
Influenza A by PCR: NEGATIVE
Influenza B by PCR: NEGATIVE
SARS Coronavirus 2 by RT PCR: NEGATIVE

## 2020-01-25 LAB — HIV ANTIBODY (ROUTINE TESTING W REFLEX): HIV Screen 4th Generation wRfx: NONREACTIVE

## 2020-01-25 MED ORDER — CLOPIDOGREL BISULFATE 75 MG PO TABS: 75.0000 mg | ORAL_TABLET | ORAL | Status: DC

## 2020-01-25 MED ORDER — GABAPENTIN 600 MG PO TABS
600.0000 mg | ORAL_TABLET | Freq: Every day | ORAL | Status: DC
  Administered 2020-01-25: 600 mg via ORAL
  Filled 2020-01-25: qty 1

## 2020-01-25 MED ORDER — INFLUENZA VAC A&B SA ADJ QUAD 0.5 ML IM PRSY: 0.5000 mL | PREFILLED_SYRINGE | INTRAMUSCULAR | Status: DC

## 2020-01-25 MED ORDER — METOPROLOL TARTRATE 12.5 MG HALF TABLET
12.5000 mg | ORAL_TABLET | Freq: Two times a day (BID) | ORAL | Status: DC
  Administered 2020-01-25: 12.5 mg via ORAL
  Filled 2020-01-25 (×2): qty 1

## 2020-01-25 MED ORDER — ISOSORBIDE MONONITRATE ER 30 MG PO TB24
15.0000 mg | ORAL_TABLET | Freq: Every day | ORAL | Status: DC
  Administered 2020-01-25: 15 mg via ORAL
  Filled 2020-01-25: qty 1

## 2020-01-25 MED ORDER — MORPHINE SULFATE (PF) 2 MG/ML IV SOLN: 2.0000 mg | INTRAVENOUS | Status: DC | PRN

## 2020-01-25 MED ORDER — ROSUVASTATIN CALCIUM 20 MG PO TABS
20.0000 mg | ORAL_TABLET | Freq: Every day | ORAL | Status: DC
  Administered 2020-01-25: 20 mg via ORAL
  Filled 2020-01-25: qty 1

## 2020-01-25 MED ORDER — ISOSORBIDE MONONITRATE ER 30 MG PO TB24: 15.0000 mg | ORAL_TABLET | Freq: Every day | ORAL | 0 refills | Status: DC

## 2020-01-25 MED ORDER — LEVOTHYROXINE SODIUM 100 MCG PO TABS
100.0000 ug | ORAL_TABLET | Freq: Every day | ORAL | Status: DC
  Administered 2020-01-25: 100 ug via ORAL
  Filled 2020-01-25: qty 1

## 2020-01-25 MED ORDER — FENOFIBRATE 160 MG PO TABS
160.0000 mg | ORAL_TABLET | Freq: Every day | ORAL | Status: DC
  Administered 2020-01-25: 160 mg via ORAL
  Filled 2020-01-25 (×2): qty 1

## 2020-01-25 MED ORDER — ACETAMINOPHEN 325 MG PO TABS: 650.0000 mg | ORAL_TABLET | ORAL | Status: DC | PRN

## 2020-01-25 MED ORDER — HEPARIN SODIUM (PORCINE) 5000 UNIT/ML IJ SOLN
5000.0000 [IU] | Freq: Three times a day (TID) | INTRAMUSCULAR | Status: DC
  Administered 2020-01-25: 5000 [IU] via SUBCUTANEOUS
  Filled 2020-01-25: qty 1

## 2020-01-25 MED ORDER — ASPIRIN EC 81 MG PO TBEC
81.0000 mg | DELAYED_RELEASE_TABLET | Freq: Every day | ORAL | Status: DC
  Administered 2020-01-25: 81 mg via ORAL
  Filled 2020-01-25: qty 1

## 2020-01-25 MED ORDER — ISOSORBIDE MONONITRATE ER 30 MG PO TB24: 15.0000 mg | ORAL_TABLET | Freq: Every day | ORAL | 0 refills | Status: AC

## 2020-01-25 MED ORDER — ONDANSETRON HCL 4 MG/2ML IJ SOLN: 4.0000 mg | Freq: Four times a day (QID) | INTRAMUSCULAR | Status: DC | PRN

## 2020-01-25 MED ORDER — MELATONIN 3 MG PO TABS
3.0000 mg | ORAL_TABLET | Freq: Every evening | ORAL | Status: DC | PRN
  Filled 2020-01-25: qty 1

## 2020-01-25 MED ORDER — METOPROLOL TARTRATE 25 MG PO TABS: 25.0000 mg | ORAL_TABLET | Freq: Two times a day (BID) | ORAL | Status: DC

## 2020-01-25 MED ORDER — DIPHENHYDRAMINE HCL 50 MG/ML IJ SOLN: 25.0000 mg | Freq: Four times a day (QID) | INTRAMUSCULAR | Status: DC | PRN

## 2020-01-25 MED ORDER — CLOPIDOGREL BISULFATE 75 MG PO TABS
75.0000 mg | ORAL_TABLET | Freq: Every day | ORAL | Status: DC
  Administered 2020-01-25: 75 mg via ORAL
  Filled 2020-01-25: qty 1

## 2020-01-25 MED ORDER — NITROGLYCERIN 0.4 MG SL SUBL: 0.4000 mg | SUBLINGUAL_TABLET | SUBLINGUAL | Status: DC | PRN

## 2020-01-25 MED ORDER — TAMSULOSIN HCL 0.4 MG PO CAPS
0.4000 mg | ORAL_CAPSULE | Freq: Every day | ORAL | Status: DC
  Administered 2020-01-25: 0.4 mg via ORAL
  Filled 2020-01-25: qty 1

## 2020-01-25 MED ORDER — PANTOPRAZOLE SODIUM 40 MG PO TBEC
40.0000 mg | DELAYED_RELEASE_TABLET | Freq: Every day | ORAL | Status: DC
  Administered 2020-01-25: 40 mg via ORAL
  Filled 2020-01-25: qty 1

## 2020-01-25 NOTE — Discharge Instructions (Signed)

## 2020-01-25 NOTE — Consult Note (Addendum)
Cardiology Consultation:   Patient ID: Tony Hunt; [id number]; 1955-12-09   Admit date: 01/24/2020 Date of Consult: 01/25/2020  Primary Care Provider: Tonia Ghent, MD Primary Cardiologist: Mertie Moores, MD 11/06/2019 Primary Electrophysiologist:  None   Patient Profile:   Tony Hunt is a 64 y.o. male with a hx of DES LAD 2014, HTN, HLD, hypothyroid, AAA, anxiety/depression, D-CHF, chronic glomerulonephritis, CKD III, GERD, carotid dz s/p R TCAR transcarotid stent 10/15/2019, who is being seen today for the evaluation of chest pain at the request of Dr Nevada Crane.  History of Present Illness:   Mr. Crutchfield was seen by Dr Acie Fredrickson 09/07. He was doing well after his carotid stent, on ASA, Plavix. No angina.   Mr. Friedt walks at home and stays busy around the house and yard, always building things.  He denies any chest pain or shortness of breath with exertion.  He has recovered well from his carotid surgery and has no issues with that.  Yesterday, he was dozing on the couch watching TV, when he had sudden onset of chest pain, just to the left of the lower third of his sternum.  He said it felt like somebody was pressing on his chest with the knee.  It woke him.  He felt like he could not take a deep breath and could only breathe sitting straight up or standing.  He had some hot flashes and dry heaves as well.  The pain was a 10/10 and a pressure.  It radiated some to his left arm which was tingling.    He has never had this before, it is not like what he had before stent.  He took 2 nitroglycerin at home, getting the pain down to a 7/10.  When his symptoms did not resolve, he called EMS.  EMS gave him nitro and morphine as well as 324 mg aspirin.  Initial blood pressure 220/110.  He states his systolic is normally about 130 and he does not check his blood pressure at home.  O2 saturation was 100% on room air.  The morphine helped relieve his pain, but it did not completely resolve for  about 6 hours.  Currently he is resting comfortably.  He initially went to Dr. Moshe Cipro several euros ago because he had a lot of protein in his urine.  He sees her about once a year.   Past Medical History:  Diagnosis Date   AAA (abdominal aortic aneurysm) without rupture ([place]) 02/14/2014   [place] 1/19:  AAA 3 cm   ALCOHOL ABUSE, HX OF    distant history   Anxiety    occ panic sx, increased after death of mother   ANXIETY DEPRESSION 05/10/2007   history of, during difficult relationship   CAD (coronary artery disease) 08/28/2012   08/25/2012 Successful PTCA/DES x mid LAD // LAD prox 30, mid stent patent; D1 40; LCx ok; OM2 50, 50; RCA prox 20; EF 55-65  // Nuclear stress test 9/19: EF 58, small inferobasal infarct, no ischemia, Low Risk (inf defect ? 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 Melvyn Novas, pulmonary     Past Surgical History:  Procedure Laterality Date   APPENDECTOMY     BACK  SURGERY  1997   lower back x3   CARPOMETACARPAL ([place]) FUSION OF THUMB Left 04/02/2014   Procedure: CARPOMETACARPAL ([place]) FUSION OF THUMB/LEFT THUMB INTERPHALNGEAL JOINT FUSION;  Surgeon: Jolyn Nap, MD;  Location: [facility];  Service: Orthopedics;  Laterality: Left;   COLONOSCOPY     CORONARY STENT PLACEMENT     2.75 x 12 mm Promus Premier DES was deployed in the mid LAD. The stent was post-dilated with a 3.0 x 9 mm [place] balloon - Mid LAD.   LARYNGOSCOPY / BRONCHOSCOPY / ESOPHAGOSCOPY  2012   bx   LEFT HEART CATH AND CORONARY ANGIOGRAPHY N/A 05/28/2016   Procedure: Left Heart Cath and Coronary Angiography;  Surgeon: Belva Crome, MD;  Location: [facility];  Service: Cardiovascular;  Laterality: N/A;   LEFT HEART CATHETERIZATION WITH CORONARY ANGIOGRAM N/A 08/25/2012   Procedure: LEFT HEART CATHETERIZATION WITH CORONARY ANGIOGRAM;  Surgeon: Burnell Blanks, MD;  [facility]: [facility] CATH LAB;  Service: Cardiovascular;  Laterality: N/A;   LEFT HEART CATHETERIZATION WITH CORONARY ANGIOGRAM N/A 11/23/2013   Procedure: LEFT HEART CATHETERIZATION WITH CORONARY ANGIOGRAM;  Surgeon: Jettie Booze, MD;  Location[facility];  Service: Cardiovascular;  Laterality: N/A;   TRANSCAROTID ARTERY REVASCULARIZATION  Right 10/15/2019   Procedure: RIGHT TRANSCAROTID ARTERY REVASCULARIZATION;  Surgeon: [name], MD;  Location: [facility];  Service: Vascular;  Laterality: Right;   ULTRASOUND GUIDANCE FOR VASCULAR ACCESS Left 10/15/2019   Procedure: ULTRASOUND GUIDANCE FOR VASCULAR ACCESS;  Surgeon: [name], MD;  Location: [facility];  Service: Vascular;  Laterality: Left;   WRIST FRACTURE SURGERY  08/21/10   Right wrist x4     Prior to Admission medications   Medication Sig Start Date End Date Taking? Authorizing Provider  aspirin EC 81 MG tablet Take 81 mg by mouth daily. Swallow whole.   Yes [provider]  clopidogrel (PLAVIX) 75 MG tablet TAKE 1 TABLET  DAILY WITH BREAKFAST Patient taking differently: Take 75 mg by mouth See admin instructions. Take 75 mg by mouth in the morning with breakfast and should NOT be held prior to cath procedure unless specifically ordered 02/28/19  Yes Nahser, Wonda Cheng, MD  cyanocobalamin (,VITAMIN B-12,) 1000 MCG/ML injection AT THE START OF THERAPY INJECT WEEKLY FOR 4 WEEKS AND THEN MONTHLY THEREAFTER, FOR VITAMIN B12 DEFICIENCY Patient taking differently: Inject 1,000 mcg into the skin every 30 (thirty) days.  10/30/19  Yes Tonia Ghent, MD  fenofibrate 160 MG tablet TAKE 1 TABLET DAILY Patient taking differently: Take 160 mg by mouth at bedtime.  12/12/19  Yes Nahser, Wonda Cheng, MD  gabapentin (NEURONTIN) 100 MG capsule Take 100  mg by mouth daily as needed (for headaches).   Yes [provider]  gabapentin (NEURONTIN) 600 MG tablet Take 1 tablet (600 mg total) by mouth at bedtime. 04/10/18  Yes Tonia Ghent, MD  levothyroxine (SYNTHROID) 100 MCG tablet TAKE 1 TABLET DAILY BEFORE BREAKFAST Patient taking differently: Take 100 mcg by mouth daily before breakfast.  10/23/19  Yes Tonia Ghent, MD  melatonin 3 MG TABS tablet Take 3 mg by mouth at bedtime as needed (for sleep).    Yes [provider]  metoprolol tartrate (LOPRESSOR) 25 MG tablet TAKE ONE-HALF (1/2) TABLET TWICE A DAY Patient taking differently: Take 12.5 mg by mouth 2 (two) times daily.  03/05/19  Yes Weaver, Scott T, PA-C  nitroGLYCERIN (NITROSTAT) 0.4 MG SL tablet Place 1 tablet (0.4 mg total) under the tongue every 5 (five) minutes as needed for chest pain (do not exceed 3 pills during one episode). Patient taking differently: Place 0.4 mg under the tongue every 5 (five) minutes as needed for chest pain ("and do not exceed 3 pills during one episode").  09/13/17  Yes [place], Scott T, PA-C  pantoprazole (PROTONIX) 40 MG tablet TAKE 1 TABLET DAILY Patient taking differently: Take 40 mg by mouth daily before breakfast.  05/21/19  Yes  Tonia Ghent, MD  REPATHA SURECLICK 253 MG/ML SOAJ INJECT 1 PEN INTO THE SKIN EVERY 14 DAYS Patient taking differently: Inject 140 mg into the vein every 14 (fourteen) days.  10/02/19  Yes Nahser, Wonda Cheng, MD  rosuvastatin (CRESTOR) 20 MG tablet TAKE 1 TABLET DAILY Patient taking differently: Take 20 mg by mouth at bedtime.  12/24/19  Yes Tonia Ghent, MD  tamsulosin (FLOMAX) 0.4 MG CAPS capsule TAKE 1 CAPSULE DAILY Patient taking differently: Take 0.4 mg by mouth daily.  12/28/19  Yes Tonia Ghent, MD  traMADol (ULTRAM) 50 MG tablet Take 1 tablet (50 mg total) by mouth every 6 (six) hours as needed. Patient taking differently: Take 50 mg by mouth every 6 (six) hours as needed (for pain).  10/16/19 10/15/20 Yes Baglia, Corrina, PA-C  SYRINGE-NEEDLE, DISP, 3 ML 25G X 1" 3 ML MISC Patient is to use to self inject 1 ml of vitamin B12 per provider instructions. 02/12/19   Tonia Ghent, MD    Inpatient Medications: Scheduled Meds:  aspirin EC  81 mg Oral Daily   clopidogrel  75 mg Oral Q breakfast   fenofibrate  160 mg Oral QHS   gabapentin  600 mg Oral QHS   heparin  5,000 Units Subcutaneous Q8H   [START ON 01/26/2020] influenza vaccine adjuvanted  0.5 mL Intramuscular Tomorrow-1000   levothyroxine  100 mcg Oral QAC breakfast   metoprolol tartrate  12.5 mg Oral BID   pantoprazole  40 mg Oral QAC breakfast   rosuvastatin  20 mg Oral QHS   tamsulosin  0.4 mg Oral Daily   Continuous Infusions:   PRN Meds: acetaminophen, diphenhydrAMINE, melatonin, morphine injection, nitroGLYCERIN, ondansetron (ZOFRAN) IV  Allergies:    Allergies  Allergen Reactions   Imdur [Isosorbide Dinitrate] Other (See Comments)    Headache at 60 mg dose, but able to tolerate a 30 mg per day   Iodine-131 Other (See Comments)    "Passed out"   Iohexol Anaphylaxis and Other (See Comments)    OK with 13 hour prep   Lisinopril Other (See Comments)    Elevated cr- improved off med   Pravastatin Nausea  Only and Other (See Comments)  Intense headache   Codeine Itching   Simvastatin Other (See Comments)    Edema   Statins Other (See Comments)    Significant edema on simvastatin    Social History:   Social History   Socioeconomic History   Marital status: Married    Spouse name: Not on file   Number of children: 1   Years of education: Not on file   Highest education level: Not on file  Occupational History   Occupation:      Employer: UNEMPLOYED    Comment: Retired  Tobacco Use   Smoking status: Former Smoker    Quit date: 09/30/2010    Years since quitting: 9.3   Smokeless tobacco: Former Counsellor Use: Never used  Substance and Sexual Activity   Alcohol use: Not Currently    Comment: occ    Drug use: No   Sexual activity: Yes  Other Topics Concern   Not on file  Social History Narrative   Married 8/13. 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  Noted tinnitus, h/o noise exposure that was service related.    Social Determinants of Health   Financial Resource Strain:    Difficulty of Paying Living Expenses: Not on file  Food Insecurity:    Worried About Charity fundraiser in the Last Year: Not on file   [employer] of Food in the Last Year: Not on file  Transportation Needs:    Lack of Transportation (Medical): Not on file   Lack of Transportation (Non-Medical): Not on file  Physical Activity:    Days of Exercise per Week: Not on file   Minutes of Exercise per Session: Not on file  Stress:    Feeling of Stress : Not on file  Social Connections:    Frequency of Communication with Friends and Family: Not on file   Frequency of Social Gatherings with Friends and Family: Not on file   Attends Religious Services: Not on file   Active Member of Clubs or Organizations: Not on file   Attends Archivist Meetings: Not on file   Marital Status: Not on file  Intimate Partner Violence:    Fear  of Current or Ex-Partner: Not on file   Emotionally Abused: Not on file   Physically Abused: Not on file   Sexually Abused: Not on file    Family History:   Family History  Problem Relation Age of Onset   Brain cancer Father    Dementia Father        h/o brain tumor   Heart disease Mother    Hypertension Mother    Heart disease Brother    Hypertension Brother    Hyperlipidemia Brother    Colon polyps Brother    Colon cancer Neg Hx    Prostate cancer Neg Hx    Esophageal cancer Neg Hx    Pancreatic cancer Neg Hx    Liver disease Neg Hx    Throat cancer Neg Hx    Family Status:  Family Status  Relation Name Status   Father  Deceased   Mother  Deceased       Benjamin Stain   Brother  Alive   Neg Hx  (Not Specified)    ROS:  Please see the history of present illness.  All other ROS reviewed and negative.     Physical Exam/Data:   Vitals:  01/25/20 0200 01/25/20 0255 01/25/20 0258 01/25/20 0759  BP: (!) 141/74  131/88   Pulse: 87  79   Resp: 18  18   Temp:   98 F (36.7 C) 97.8 F (36.6 C)  TempSrc:   Oral   SpO2: 94%  96%   Weight:  91 kg    Height:        Intake/Output Summary (Last 24 hours) at 01/25/2020 1107 Last data filed at 01/25/2020 0650 Gross per 24 hour  Intake 1240 ml  Output --  Net 1240 ml    Last 3 Weights 01/25/2020 01/24/2020 12/27/2019  Weight (lbs) 200 lb 11.2 oz 198 lb 198 lb 7 oz  Weight (kg) 91.037 kg 89.812 kg 90.011 kg     Body mass index is 31.43 kg/m.   General:  Well nourished, well developed, male in no acute distress HEENT: normal Lymph: no adenopathy Neck: JVD -some elevation at baseline Endocrine:  No thryomegaly Vascular: No carotid bruits; 4/4 extremity pulses 2+  Cardiac:  normal S1, S2; RRR; no murmur Lungs:  clear bilaterally, no wheezing, rhonchi or rales  Abd: soft, nontender, no hepatomegaly  Ext: no edema Musculoskeletal:  No deformities, BUE and BLE strength normal and equal Skin: warm and dry  Neuro:   CNs 2-12 intact, no focal abnormalities noted Psych:  Normal affect   EKG:  The EKG was personally reviewed and demonstrates:  11/26 ECG is SR, HR 72, inc RBBB, no sig change from 08/04 Telemetry:  Telemetry was personally reviewed and demonstrates: Sinus rhythm   CV studies:   ECHO: 01/25/2020 pending  ECHO: 11/17/2017 - Left ventricle: The cavity size was normal. There was mild    concentric hypertrophy. Systolic function was vigorous. The    estimated ejection fraction was in the range of 65% to 70%. Wall    motion was normal; there were no regional wall motion    abnormalities. Features are consistent with a pseudonormal left    ventricular filling pattern, with concomitant abnormal relaxation    and increased filling pressure (grade 2 diastolic dysfunction).    Doppler parameters are consistent with elevated ventricular    end-diastolic filling pressure.  - Aortic valve: There was no regurgitation.  - Mitral valve: There was no regurgitation.  - Left atrium: The atrium was mildly dilated.  - Right ventricle: The cavity size was normal. Wall thickness was    normal. Systolic function was normal.  - Tricuspid valve: There was trivial regurgitation.  - Pulmonary arteries: Systolic pressure was within the normal    range.  - Inferior vena cava: The vessel was normal in size.  - Pericardium, extracardiac: There was no pericardial effusion.   MYOVIEW: 11/24/2017 Nuclear stress EF: 58%. Blood pressure demonstrated a normal response to exercise. There was no ST segment deviation noted during stress. Findings consistent with prior myocardial infarction. This is a low risk study. The left ventricular ejection fraction is normal (55-65%).   Small inferobasal infarct no ischemia EF preserved 58% Normal ECG response to exercise   CATH: 08/25/2012 - Left ventricle: The cavity size was normal. There was mild    concentric hypertrophy. Systolic function was vigorous. The     estimated ejection fraction was in the range of 65% to 70%. Wall    motion was normal; there were no regional wall motion    abnormalities. Features are consistent with a pseudonormal left    ventricular filling pattern, with concomitant abnormal relaxation    and increased  filling pressure (grade 2 diastolic dysfunction).    Doppler parameters are consistent with elevated ventricular    end-diastolic filling pressure.  - Aortic valve: There was no regurgitation.  - Mitral valve: There was no regurgitation.  - Left atrium: The atrium was mildly dilated.  - Right ventricle: The cavity size was normal. Wall thickness was    normal. Systolic function was normal.  - Tricuspid valve: There was trivial regurgitation.  - Pulmonary arteries: Systolic pressure was within the normal    range.  - Inferior vena cava: The vessel was normal in size.  - Pericardium, extracardiac: There was no pericardial effusion.    Laboratory Data:   Chemistry Recent Labs  Lab 01/24/20 1710  NA 139  K 3.7  CL 106  CO2 23  GLUCOSE 161*  BUN 19  CREATININE 1.77*  CALCIUM 8.9  GFRNONAA 42*  ANIONGAP 10    Lab Results  Component Value Date   ALT 12 01/24/2020   AST 20 01/24/2020   ALKPHOS 34 (L) 01/24/2020   BILITOT 0.5 01/24/2020   Hematology Recent Labs  Lab 01/24/20 1710  WBC 5.8  RBC 4.62  HGB 14.1  HCT 43.3  MCV 93.7  MCH 30.5  MCHC 32.6  RDW 12.7  PLT 276   Cardiac Enzymes High Sensitivity Troponin:   Recent Labs  Lab 01/24/20 1710 01/24/20 1910 01/24/20 2355  TROPONINIHS 6 13 12       BNPNo results for input(s): BNP, PROBNP in the last 168 hours.  DDimer  Recent Labs  Lab 01/24/20 1710  DDIMER 0.96*   TSH:  Lab Results  Component Value Date   TSH 1.26 08/01/2018   Lipids: Lab Results  Component Value Date   CHOL 71 (L) 11/06/2019   HDL 30 (L) 11/06/2019   LDLCALC 12 11/06/2019   LDLDIRECT 106.0 10/20/2018   TRIG 182 (H) 11/06/2019   CHOLHDL 2.4 11/06/2019    HgbA1c: Lab Results  Component Value Date   HGBA1C 5.9 08/01/2018   Magnesium:  Magnesium  Date Value Ref Range Status  02/22/2011 1.7 1.5 - 2.5 mg/dL Final     Radiology/Studies:  DG Chest Portable 1 View  Result Date: 01/24/2020 CLINICAL DATA:  Chest pain central chest radiating to left arm. Arm tingling. EXAM: PORTABLE CHEST 1 VIEW.  Patient is tilted to the left. COMPARISON:  Chest x-ray 10/08/2019, CT chest 06/30/2011 FINDINGS: The heart size and mediastinal contours are unchanged. Coronary artery stent. No focal consolidation. No pulmonary edema. No pleural effusion. No pneumothorax. No acute osseous abnormality. IMPRESSION: No active disease. Electronically Signed   By: Iven Finn M.D.   On: 01/24/2020 17:32   CT Angio Chest/Abd/Pel for Dissection W and/or Wo Contrast  Result Date: 01/24/2020 CLINICAL DATA:  Chest pain. Back pain. Sudden onset. Hypertension. Aortic dissection suspected. EXAM: CT ANGIOGRAPHY CHEST, ABDOMEN AND PELVIS TECHNIQUE: Non-contrast CT of the chest was initially obtained. Multidetector CT imaging through the chest, abdomen and pelvis was performed using the standard protocol during bolus administration of intravenous contrast. Multiplanar reconstructed images and MIPs were obtained and reviewed to evaluate the vascular anatomy. CONTRAST:  183mL OMNIPAQUE IOHEXOL 350 MG/ML SOLN COMPARISON:  CT of the abdomen and pelvis 3/15/1. CTA of the neck 09/07/19 FINDINGS: CTA CHEST FINDINGS Cardiovascular: Heart size is normal. Coronary artery calcifications are present. Atherosclerotic calcifications are present in the aortic arch and great vessel origins without focal stenosis or change. Extensive atherosclerotic changes are present in the descending thoracic aorta with circumferential mucosal  plaque. No aneurysm is present. No dissection is present. Pulmonary arteries are within normal limits. Mediastinum/Nodes: No significant mediastinal, hilar, or axillary  adenopathy is present. Thoracic inlet is within limits. Esophagus is unremarkable. Lungs/Pleura: Mild dependent atelectasis is present bilaterally. No nodule mass lesion is present. No significant pleural effusion is present. Musculoskeletal: Remote posterior left-sided rib fractures are present. No acute abnormalities are present. Vertebral body heights and alignment are maintained. Leftward curvature is present in the thoracic spine. The sternum is within normal limits. No focal lytic or blastic lesions are present. Review of the MIP images confirms the above findings. CTA ABDOMEN AND PELVIS FINDINGS VASCULAR Aorta: Diffuse atherosclerotic changes are present. Plaque is present. Focal aneurysm, stenosis, or dissection is present. Celiac: Moderate stenosis is present proximal celiac artery. Branch vessels are unremarkable. SMA: Patent without evidence of aneurysm, dissection, vasculitis or significant stenosis. Renals: Both renal arteries are patent without evidence of aneurysm, dissection, vasculitis, fibromuscular dysplasia or significant stenosis. IMA: Not visualized. Inflow: Extensive mural plaque is present on the left. Aneurysmal dilation measures up to 25 mm. Aneurysmal dilation of the right is 22 mm. Veins: No obvious venous abnormality within the limitations of this arterial phase study. Review of the MIP images confirms the above findings. NON-VASCULAR Hepatobiliary: Liver is unremarkable. Gallstone again noted at the neck of the gallbladder without associated inflammatory change. The common bile duct is within normal limits. Pancreas: Unremarkable. No pancreatic ductal dilatation or surrounding inflammatory changes. Spleen: Normal in size without focal abnormality. Adrenals/Urinary Tract: Mild parenchymal thinning is noted bilaterally. No stone or mass lesion is present. Ureters are unremarkable. The urinary bladder is within limits. Stomach/Bowel: Stomach and duodenum are within normal limits. Small  bowel is unremarkable. Terminal ileum is within normal limits. Appendix is surgically absent. Ascending and transverse colon are within normal limits. The descending and sigmoid colon are within limits. Lymphatic: No significant retroperitoneal adenopathy is present. Reproductive: Prostate is unremarkable. Other: No abdominal wall hernia or abnormality. No abdominopelvic ascites. Musculoskeletal: Grade 2 anterolisthesis is present at L5-S1 measuring 15 mm. Slight retrolisthesis present at L3-4 and L4-5. No focal lytic or blastic lesions are present. Bony pelvis is within normal limits. Hips are located and normal. Review of the MIP images confirms the above findings. IMPRESSION: 1. No aortic dissection. 2. Coronary artery disease. 3. 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 4. 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 5. Cholelithiasis without cholecystitis. 6. Grade 2 anterolisthesis at L5-S1 measuring 15 mm. 7. Aortic Atherosclerosis (ICD10-I70.0). Electronically Signed   By: San Morelle M.D.   On: 01/24/2020 23:02    Assessment and Plan:   1. Chest pain - trop normal although his pain was quite severe at first and lingered for about 6 hours - MV 2019 with scar but no ischemia, EF 58% - No hx exertional sx, BP initially very high -Echo pending, ECG no significant change -With decreased renal function and CT angiogram of the chest performed 11/25, do not want to give him dye today. -Discuss with MD if patient needs another myoview, or if we need to hydrate him over the weekend and cath Monday if renal function is stable - can add Imdur, he reportedly tolerates 30 mg qd, will start w/ 15 mg qd and see how tolerated.   2.  Hypertension: -Blood pressure significantly elevated on EMS arrival at 220/110, with sublingual nitroglycerin and pain control with morphine -PTA blood pressure med is metoprolol 12.5 mg twice daily -  No problems with  bradycardia, I will increase this to 25 mg twice daily  3. HLD - profile 10/2019 is above - continue Crestor 20 mg qd  Active Problems:   Chest pain     For questions or updates, please contact [place] Please consult [email or web address] for contact info under Cardiology/STEMI.   Signed, Rosaria Ferries, PA-C  01/25/2020 11:07 AM  Personally seen and examined. 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  No evidence of NSTEMI, one time event. No WMA's; EF largely preserved.  BP elevated on arrival.  Will attempt to uptitrate GDMT.  Patient is presently CP free.  If tolerates GDMT for CAD, will bring him back with early follow up and possible NM/CMR stress vs LCP.  Werner Lean, MD

## 2020-01-25 NOTE — Progress Notes (Signed)
The patient was admitted this morning by my partner Dr. Clearence Ped due to sudden onset substernal sharp chest pain at rest to rule out ACS.  Heart score 4.  Cardiology consulted.  Ongoing work-up thus far unrevealing.  First 3 sets of troponin negative.  Twelve-lead EKG no evidence of acute ischemia.  Not hypoxic or tachycardic.  Denies pleuritic pain.  2D echo has been ordered and is pending.    At the time of this visit his chest pain is improved 1 out of 10.  Please refer to H&P dictated by my partner Dr. Genene Churn on 01-25-20 for further details of the assessment and plan.  Will follow cardiology recommendations.

## 2020-01-25 NOTE — Progress Notes (Signed)
  Echocardiogram 2D Echocardiogram has been performed.  Tony Hunt 01/25/2020, 10:23 AM

## 2020-01-25 NOTE — H&P (Signed)
TRH H&P    Patient Demographics:    Tony Hunt, is a 64 y.o. male  MRN: [id number]  DOB - [birth year]  Admit Date - 01/24/2020  Referring MD/NP/PA: Ron Parker  Outpatient Primary MD for the patient is Tonia Ghent, MD  Patient coming from: Home  Chief complaint- Chest pain   HPI:    Tony Hunt  is a 64 y.o. male, with history of sleep apnea, hypertension, hyperlipidemia, hiatal hernia, CAD, GERD, CKD, chronic diastolic CHF, carotid artery disease, anxiety, history of alcohol abuse quit 1 year ago presents to the ER with a chief complaint of chest pain.  Patient reports that he had sudden onset of chest pain that woke him out of sleep.  It felt like a pressure or heaviness.  It was in the middle of his chest and was associated with left arm paresthesias.  He had associated shortness of breath, nausea, dry heaving, diaphoresis, feeling flushed, and feeling chilled as well.  He denies palpitations.  He has had an MI before and this does not feel the same.  His MI was less painful than this.  Patient reports the pain was 10 out of 10 at its worst.  2 out of 10 now.  In EMS he was given 2 nitros which did not help at all, and morphine which she reports did not touch the pain either.  The pain lasted for total 2 hours.  During this time nothing made it better, nothing made it worse.  He thinks the Dilaudid is what eventually resolved the pain.  Patient reports that he had been in his normal state of health up until this started.  He denies eating a big dinner for Thanksgiving, indigestion, or burning pain.  He denies any heavy lifting or possible causes of costochondritis.  Patient does report that he had a stent placed in September 2021 in his carotid artery.  He also had a stent in his heart 10 years ago.  His last stress test was 2 or 3 years ago.  He follows with [facility].  He is vaccinated for Covid.  He does not  smoke.  He has a history of alcohol abuse but quit 1 year ago.  He denies illicit drug use.  Patient is full code.  In the ED Temp 97.5, heart rate 83, respiratory rate 19, blood pressure 135/97, satting at 96% on room air No leukocytosis with a white blood cell count of 5.8, hemoglobin 14.1 Chemistry panel is unremarkable with a creatinine of 1.77 that is his baseline Lipase 34 Albumin 3.4 Trope initial 6, repeat 13, repeat again pending D-dimer was 0.96 Patient had a documented history of anaphylaxis to contrast, but reports that he has had contrast several times. He was pretreated and then had a CT angios of his chest abdomen and pelvis -it showed no dissection.  Coronary artery disease.  Moderate stenosis of proximal celiac artery.  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 Chest x-ray showed no  active disease EKG showed a heart rate of 68, sinus rhythm, QTC 429, no acute ischemic changes Admission requested for further work-up    Review of systems:    In addition to the HPI above,  No Fever admits to chills during chest pain episode, No Headache, No changes with Vision or hearing, No problems swallowing food or Liquids, Admits to chest pain and associated shortness of breath but no cough No Abdominal pain, admits to nausea and dry heaving but no vomiting bowel movements are regular, No Blood in stool or Urine, No dysuria, No new skin rashes or bruises, No new joints pains-aches,  No new weakness, admits to paresthesias that are associated with chest pain and where in left arm numbness in any extremity, No recent weight gain or loss, No polyuria, polydypsia or polyphagia, No significant Mental Stressors.  All other systems reviewed and are negative.    Past History of the following :    Past Medical History:  Diagnosis Date  . AAA (abdominal aortic aneurysm) without rupture ([place]) 02/14/2014    [place] 1/19:  AAA 3 cm  . ALCOHOL ABUSE, HX OF    distant history  . Anxiety    occ panic sx, increased after death of mother  . ANXIETY DEPRESSION 05/10/2007   history of, during difficult relationship  . CAD (coronary artery disease) 08/28/2012   08/25/2012 Successful PTCA/DES x mid LAD // LAD prox 30, mid stent patent; D1 40; LCx ok; OM2 50, 50; RCA prox 20; EF 55-65  // Nuclear stress test 9/19: EF 58, small inferobasal infarct, no ischemia, Low Risk (inf defect ? diaph atten?)   . 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 Carotid artery disease ([place]) 09/12/2017   [place] 6/19:  R 60-79; L 1-39; R vertebral occluded; FU 12 months  . 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 Chronic diastolic CHF (congestive heart failure) ([place]) 12/21/2017   Echo 11/17/2017 - Mild concentric LVH, EF 65-70, normal wall motion, grade 2 diastolic dysfunction, mild LAE, normal RVSF, trivial TR  . CKD (chronic kidney disease)    Nephrologist is Dr. Corliss Parish (10/03/19)  . Dilatation of aorta ([facility]) 02/14/2014  . Diverticulosis   . ESOPHAGEAL STRICTURE 05/10/2007  . GERD 05/10/2007  . Heart disease   . HIATAL HERNIA 05/10/2007  . History of echocardiogram    Echo 9/19: mild conc LVH, vigorous LVF, EF 65-70, Gr 2 DD, mild LAE, normal RVSF, trivial AI  . History of radiation therapy   . HYPERLIPIDEMIA 05/10/2007  . HYPERTENSION 05/10/2007  . 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 SLEEP APNEA 05/10/2007   lost 100lb-not now  . Upper airway cough syndrome    Per Dr. Melvyn Novas, pulmonary       Past Surgical History:  Procedure Laterality Date  . APPENDECTOMY    . BACK SURGERY  1997   lower back x3  . CARPOMETACARPAL ([facility]) FUSION OF THUMB Left 04/02/2014   Procedure: CARPOMETACARPAL ([place]) FUSION OF THUMB/LEFT THUMB INTERPHALNGEAL JOINT FUSION;  Surgeon: Jolyn Nap, MD;  Location: [facility];  Service: Orthopedics;   Laterality: Left;  . COLONOSCOPY    . CORONARY STENT PLACEMENT     2.75 x 12 mm Promus Premier DES was deployed in the mid LAD. The stent was post-dilated with a 3.0 x 9 mm Jasper balloon - Mid LAD.  Marland Kitchen LARYNGOSCOPY / BRONCHOSCOPY / ESOPHAGOSCOPY  2012  bx  . LEFT HEART CATH AND CORONARY ANGIOGRAPHY N/A 05/28/2016   Procedure: Left Heart Cath and Coronary Angiography;  Surgeon: Belva Crome, MD;  Location: [facility];  Service: Cardiovascular;  Laterality: N/A;  . LEFT HEART CATHETERIZATION WITH CORONARY ANGIOGRAM N/A 08/25/2012   Procedure: LEFT HEART CATHETERIZATION WITH CORONARY ANGIOGRAM;  Surgeon: Burnell Blanks, MD;  Location: [facility];  Service: Cardiovascular;  Laterality: N/A;  . LEFT HEART CATHETERIZATION WITH CORONARY ANGIOGRAM N/A 11/23/2013   Procedure: LEFT HEART CATHETERIZATION WITH CORONARY ANGIOGRAM;  Surgeon: Jettie Booze, MD;  Location: [facility] CATH LAB;  Service: Cardiovascular;  Laterality: N/A;  . TRANSCAROTID ARTERY REVASCULARIZATION Right 10/15/2019   Procedure: RIGHT TRANSCAROTID ARTERY REVASCULARIZATION;  Surgeon: [name], MD;  Location: [facility];  Service: Vascular;  Laterality: Right;  . ULTRASOUND GUIDANCE FOR VASCULAR ACCESS Left 10/15/2019   Procedure: ULTRASOUND GUIDANCE FOR VASCULAR ACCESS;  Surgeon: [name], MD;  Location: [facility];  Service: Vascular;  Laterality: Left;  . WRIST FRACTURE SURGERY  08/21/10   Right wrist x4      Social History:      Social History   Tobacco Use  . Smoking status: Former Smoker    Quit date: 09/30/2010    Years since quitting: 9.3  . Smokeless tobacco: Former Network engineer Use Topics  . Alcohol use: Yes    Comment: occ        Family History :     Family History  Problem Relation Age of Onset  . Brain cancer Father   . Dementia Father        h/o brain tumor  . Heart disease Mother   . Hypertension Mother   . Heart disease Brother   . Hypertension Brother   .  Hyperlipidemia Brother   . Colon polyps Brother   . Colon cancer Neg Hx   . Prostate cancer Neg Hx   . Esophageal cancer Neg Hx   . Pancreatic cancer Neg Hx   . Liver disease Neg Hx   . Throat cancer Neg Hx       Home Medications:   Prior to Admission medications   Medication Sig Start Date End Date Taking? Authorizing Provider  aspirin EC 81 MG tablet Take 81 mg by mouth daily. Swallow whole.   Yes [provider]  clopidogrel (PLAVIX) 75 MG tablet TAKE 1 TABLET DAILY WITH BREAKFAST Patient taking differently: Take 75 mg by mouth See admin instructions. Take 75 mg by mouth in the morning with breakfast and should NOT be held prior to cath procedure unless specifically ordered 02/28/19  Yes Nahser, Wonda Cheng, MD  cyanocobalamin (,VITAMIN B-12,) 1000 MCG/ML injection AT THE START OF THERAPY INJECT WEEKLY FOR 4 WEEKS AND THEN MONTHLY THEREAFTER, FOR VITAMIN B12 DEFICIENCY Patient taking differently: Inject 1,000 mcg into the skin every 30 (thirty) days.  10/30/19  Yes Tonia Ghent, MD  fenofibrate 160 MG tablet TAKE 1 TABLET DAILY Patient taking differently: Take 160 mg by mouth at bedtime.  12/12/19  Yes Nahser, Wonda Cheng, MD  gabapentin (NEURONTIN) 100 MG capsule Take 100 mg by mouth daily as needed (for headaches).   Yes [provider]  gabapentin (NEURONTIN) 600 MG tablet Take 1 tablet (600 mg total) by mouth at bedtime. 04/10/18  Yes Tonia Ghent, MD  levothyroxine (SYNTHROID) 100 MCG tablet TAKE 1 TABLET DAILY BEFORE BREAKFAST Patient taking differently: Take 100 mcg by mouth daily before breakfast.  10/23/19  Yes Tonia Ghent, MD  melatonin 3 MG TABS tablet Take 3 mg by mouth at bedtime as needed (for sleep).    Yes [provider]  metoprolol tartrate (LOPRESSOR) 25 MG tablet TAKE ONE-HALF (1/2) TABLET TWICE A DAY Patient taking differently: Take 12.5 mg by mouth 2 (two) times daily.  03/05/19  Yes Weaver, Scott T, PA-C  nitroGLYCERIN (NITROSTAT)  0.4 MG SL tablet Place 1 tablet (0.4 mg total) under the tongue every 5 (five) minutes as needed for chest pain (do not exceed 3 pills during one episode). Patient taking differently: Place 0.4 mg under the tongue every 5 (five) minutes as needed for chest pain ("and do not exceed 3 pills during one episode").  09/13/17  Yes Weaver, Scott T, PA-C  pantoprazole (PROTONIX) 40 MG tablet TAKE 1 TABLET DAILY Patient taking differently: Take 40 mg by mouth daily before breakfast.  05/21/19  Yes Tonia Ghent, MD  REPATHA SURECLICK 235 MG/ML SOAJ INJECT 1 PEN INTO THE SKIN EVERY 14 DAYS Patient taking differently: Inject 140 mg into the vein every 14 (fourteen) days.  10/02/19  Yes Nahser, Wonda Cheng, MD  rosuvastatin (CRESTOR) 20 MG tablet TAKE 1 TABLET DAILY Patient taking differently: Take 20 mg by mouth at bedtime.  12/24/19  Yes Tonia Ghent, MD  tamsulosin (FLOMAX) 0.4 MG CAPS capsule TAKE 1 CAPSULE DAILY Patient taking differently: Take 0.4 mg by mouth daily.  12/28/19  Yes Tonia Ghent, MD  traMADol (ULTRAM) 50 MG tablet Take 1 tablet (50 mg total) by mouth every 6 (six) hours as needed. Patient taking differently: Take 50 mg by mouth every 6 (six) hours as needed (for pain).  10/16/19 10/15/20 Yes Baglia, Corrina, PA-C  SYRINGE-NEEDLE, DISP, 3 ML 25G X 1" 3 ML MISC Patient is to use to self inject 1 ml of vitamin B12 per provider instructions. 02/12/19   Tonia Ghent, MD     Allergies:     Allergies  Allergen Reactions  . Imdur [Isosorbide Dinitrate] Other (See Comments)    Headache at 60 mg dose, but able to tolerate a 30 mg per day  . Iodine-131 Other (See Comments)    "Passed out"  . Iohexol Anaphylaxis and Other (See Comments)    OK with 13 hour prep  . Lisinopril Other (See Comments)    Elevated cr- improved off med  . Pravastatin Nausea Only and Other (See Comments)    Intense headache  . Codeine Itching  . Simvastatin Other (See Comments)    Edema  . Statins Other  (See Comments)    Significant edema on simvastatin     Physical Exam:   Vitals  Blood pressure (!) 135/97, pulse 83, temperature (!) 97.5 F (36.4 C), temperature source Oral, resp. rate 19, height 5\' 7"  (1.702 m), weight 89.8 kg, SpO2 96 %.  1.  General: Patient lying supine in bed with head of bed elevated eating peanut butter crackers and applesauce  2. Psychiatric: Mood and behavior normal for situation, cooperative with exam, alert and oriented x3  3. Neurologic: Cranial nerves II through XII are intact, no focal deficit on limited exam, speech and language normal, equal strength in the upper extremities bilaterally, equal sensation in the upper extremities bilaterally  4. HEENMT:  Head is atraumatic, normocephalic, pupils are reactive to light, neck is supple, trachea is midline, mucous membranes are moist  5. Respiratory : Lungs are clear to auscultation bilaterally without rhonchi, wheeze, crackles, no cyanosis, clubbing  6. Cardiovascular : Heart rate is normal, rhythm is regular, no murmurs rubs or gallops, no peripheral edema  7. Gastrointestinal:  Abdomen is soft, nondistended, nontender to palpation, bowel sounds active  8. Skin:  Skin is warm dry and intact no acute lesions on limited skin exam  9.Musculoskeletal:  No acute deformity, no calf tenderness, no peripheral edema    Data Review:    CBC Recent Labs  Lab 01/24/20 1710  WBC 5.8  HGB 14.1  HCT 43.3  PLT 276  MCV 93.7  MCH 30.5  MCHC 32.6  RDW 12.7  LYMPHSABS 1.3  MONOABS 0.5  EOSABS 0.1  BASOSABS 0.1   ------------------------------------------------------------------------------------------------------------------  Results for orders placed or performed during the hospital encounter of 01/24/20 (from the past 48 hour(s))  CBC with Differential     Status: None   Collection Time: 01/24/20  5:10 PM  Result Value Ref Range   WBC 5.8 4.0 - 10.5 K/uL   RBC 4.62 4.22 - 5.81 MIL/uL    Hemoglobin 14.1 13.0 - 17.0 g/dL   HCT 43.3 39 - 52 %   MCV 93.7 80.0 - 100.0 fL   MCH 30.5 26.0 - 34.0 pg   MCHC 32.6 30.0 - 36.0 g/dL   RDW 12.7 11.5 - 15.5 %   Platelets 276 150 - 400 K/uL   nRBC 0.0 0.0 - 0.2 %   Neutrophils Relative % 64 %   Neutro Abs 3.7 1.7 - 7.7 K/uL   Lymphocytes Relative 23 %   Lymphs Abs 1.3 0.7 - 4.0 K/uL   Monocytes Relative 9 %   Monocytes Absolute 0.5 0.1 - 1.0 K/uL   Eosinophils Relative 2 %   Eosinophils Absolute 0.1 0.0 - 0.5 K/uL   Basophils Relative 1 %   Basophils Absolute 0.1 0.0 - 0.1 K/uL   Immature Granulocytes 1 %   Abs Immature Granulocytes 0.04 0.00 - 0.07 K/uL    Comment: Performed at [facility], [address], [place], [place] [address]  Comprehensive metabolic panel     Status: Abnormal   Collection Time: 01/24/20  5:10 PM  Result Value Ref Range   Sodium 139 135 - 145 mmol/L   Potassium 3.7 3.5 - 5.1 mmol/L   Chloride 106 98 - 111 mmol/L   CO2 23 22 - 32 mmol/L   Glucose, Bld 161 (H) 70 - 99 mg/dL    Comment: Glucose reference range applies only to samples taken after fasting for at least 8 hours.   BUN 19 8 - 23 mg/dL   Creatinine, Ser 1.77 (H) 0.61 - 1.24 mg/dL   Calcium 8.9 8.9 - 10.3 mg/dL   Total Protein 6.1 (L) 6.5 - 8.1 g/dL   Albumin 3.4 (L) 3.5 - 5.0 g/dL   AST 20 15 - 41 U/L   ALT 12 0 - 44 U/L   Alkaline Phosphatase 34 (L) 38 - 126 U/L   Total Bilirubin 0.5 0.3 - 1.2 mg/dL   GFR, Estimated 42 (L) >60 mL/min    Comment: (NOTE) Calculated using the CKD-EPI Creatinine Equation (2021)    Anion gap 10 5 - 15    Comment: Performed at [facility], [place] [address] [place], [place] [address]  Lipase, blood     Status: None   Collection Time: 01/24/20  5:10 PM  Result Value Ref Range   Lipase 34 11 - 51 U/L    Comment: Performed at [facility] [address],  [place], [place] [address]  Troponin I (High Sensitivity)     Status: None   Collection Time: 01/24/20  5:10 PM  Result Value Ref  Range   Troponin I (High Sensitivity) 6 <18 ng/L    Comment: (NOTE) Elevated high sensitivity troponin I (hsTnI) values and significant  changes across serial measurements may suggest ACS but many other  chronic and acute conditions are known to elevate hsTnI results.  Refer to the "Links" section for chest pain algorithms and additional  guidance. Performed at [facility], [place] [address], [place], [place] [address]   D-dimer, quantitative (not at [facility])     Status: Abnormal   Collection Time: 01/24/20  5:10 PM  Result Value Ref Range   D-Dimer, Quant 0.96 (H) 0.00 - 0.50 ug/mL-FEU    Comment: (NOTE) At the manufacturer cut-off value of 0.5 g/mL FEU, this assay has a negative predictive value of 95-100%.This assay is intended for use in conjunction with a clinical pretest probability (PTP) assessment model to exclude pulmonary embolism (PE) and deep venous thrombosis (DVT) in outpatients suspected of PE or DVT. Results should be correlated with clinical presentation. Performed at [facility], [place] [address], [place], [place] [address]   Urinalysis, Routine w reflex microscopic Urine, Clean Catch     Status: Abnormal   Collection Time: 01/24/20  5:10 PM  Result Value Ref Range   Color, Urine YELLOW YELLOW   APPearance CLEAR CLEAR   Specific Gravity, Urine 1.017 1.005 - 1.030   pH 5.0 5.0 - 8.0   Glucose, UA NEGATIVE NEGATIVE mg/dL   Hgb urine dipstick NEGATIVE NEGATIVE   Bilirubin Urine NEGATIVE NEGATIVE   Ketones, ur NEGATIVE NEGATIVE mg/dL   Protein, ur >=300 (A) NEGATIVE mg/dL   Nitrite NEGATIVE NEGATIVE   Leukocytes,Ua NEGATIVE NEGATIVE   RBC / HPF 0-5 0 - 5 RBC/hpf   WBC, UA 0-5 0 - 5 WBC/hpf   Bacteria, UA RARE (A) NONE SEEN   Squamous Epithelial / LPF 0-5 0 - 5   Mucus PRESENT     Comment: Performed at [facility], [place] [address], [place], [place] [address]  Troponin I (High Sensitivity)     Status: None   Collection Time: 01/24/20  7:10 PM    Result Value Ref Range   Troponin I (High Sensitivity) 13 <18 ng/L    Comment: (NOTE) Elevated high sensitivity troponin I (hsTnI) values and significant  changes across serial measurements may suggest ACS but many other  chronic and acute conditions are known to elevate hsTnI results.  Refer to the "Links" section for chest pain algorithms and additional  guidance. Performed at [facility], [place] [address] [place], [place] [address]     Chemistries  Recent Labs  Lab 01/24/20 1710  NA 139  K 3.7  CL 106  CO2 23  GLUCOSE 161*  BUN 19  CREATININE 1.77*  CALCIUM 8.9  AST 20  ALT 12  ALKPHOS 34*  BILITOT 0.5   ------------------------------------------------------------------------------------------------------------------  ------------------------------------------------------------------------------------------------------------------ GFR: Estimated Creatinine Tony: 44.5 mL/min (A) (by C-G formula based on SCr of 1.77 mg/dL (H)). Liver Function Tests: Recent Labs  Lab 01/24/20 1710  AST 20  ALT 12  ALKPHOS 34*  BILITOT 0.5  PROT 6.1*  ALBUMIN 3.4*   Recent Labs  Lab 01/24/20 1710  LIPASE 34   No results for input(s): AMMONIA in the last 168 hours. Coagulation Profile: No results for input(s): INR, PROTIME in the last  168 hours. Cardiac Enzymes: No results for input(s): CKTOTAL, CKMB, CKMBINDEX, TROPONINI in the last 168 hours. BNP (last 3 results) Recent Labs    04/10/19 0840 10/08/19 0952  PROBNP 43.0 47.0   HbA1C: No results for input(s): HGBA1C in the last 72 hours. CBG: No results for input(s): GLUCAP in the last 168 hours. Lipid Profile: No results for input(s): CHOL, HDL, LDLCALC, TRIG, CHOLHDL, LDLDIRECT in the last 72 hours. Thyroid Function Tests: No results for input(s): TSH, T4TOTAL, FREET4, T3FREE, THYROIDAB in the last 72 hours. Anemia Panel: No results for input(s): VITAMINB12, FOLATE, FERRITIN, TIBC, IRON, RETICCTPCT  in the last 72 hours.  --------------------------------------------------------------------------------------------------------------- Urine analysis:    Component Value Date/Time   COLORURINE YELLOW 01/24/2020 1710   APPEARANCEUR CLEAR 01/24/2020 1710   LABSPEC 1.017 01/24/2020 1710   PHURINE 5.0 01/24/2020 1710   GLUCOSEU NEGATIVE 01/24/2020 1710   HGBUR NEGATIVE 01/24/2020 1710   BILIRUBINUR NEGATIVE 01/24/2020 1710   BILIRUBINUR Neg 12/27/2019 1134   KETONESUR NEGATIVE 01/24/2020 1710   PROTEINUR >=300 (A) 01/24/2020 1710   UROBILINOGEN 1.0 12/27/2019 1134   UROBILINOGEN 0.2 08/13/2014 1740   NITRITE NEGATIVE 01/24/2020 1710   LEUKOCYTESUR NEGATIVE 01/24/2020 1710      Imaging Results:    DG Chest Portable 1 View  Result Date: 01/24/2020 CLINICAL DATA:  Chest pain central chest radiating to left arm. Arm tingling. EXAM: PORTABLE CHEST 1 VIEW.  Patient is tilted to the left. COMPARISON:  Chest x-ray 10/08/2019, CT chest 06/30/2011 FINDINGS: The heart size and mediastinal contours are unchanged. Coronary artery stent. No focal consolidation. No pulmonary edema. No pleural effusion. No pneumothorax. No acute osseous abnormality. IMPRESSION: No active disease. Electronically Signed   By: Iven Finn M.D.   On: 01/24/2020 17:32   CT Angio Chest/Abd/Pel for Dissection W and/or Wo Contrast  Result Date: 01/24/2020 CLINICAL DATA:  Chest pain. Back pain. Sudden onset. Hypertension. Aortic dissection suspected. EXAM: CT ANGIOGRAPHY CHEST, ABDOMEN AND PELVIS TECHNIQUE: Non-contrast CT of the chest was initially obtained. Multidetector CT imaging through the chest, abdomen and pelvis was performed using the standard protocol during bolus administration of intravenous contrast. Multiplanar reconstructed images and MIPs were obtained and reviewed to evaluate the vascular anatomy. CONTRAST:  192mL OMNIPAQUE IOHEXOL 350 MG/ML SOLN COMPARISON:  CT of the abdomen and pelvis 3/15/1. CTA of the  neck 09/07/19 FINDINGS: CTA CHEST FINDINGS Cardiovascular: Heart size is normal. Coronary artery calcifications are present. Atherosclerotic calcifications are present in the aortic arch and great vessel origins without focal stenosis or change. Extensive atherosclerotic changes are present in the descending thoracic aorta with circumferential mucosal plaque. No aneurysm is present. No dissection is present. Pulmonary arteries are within normal limits. Mediastinum/Nodes: No significant mediastinal, hilar, or axillary adenopathy is present. Thoracic inlet is within limits. Esophagus is unremarkable. Lungs/Pleura: Mild dependent atelectasis is present bilaterally. No nodule mass lesion is present. No significant pleural effusion is present. Musculoskeletal: Remote posterior left-sided rib fractures are present. No acute abnormalities are present. Vertebral body heights and alignment are maintained. Leftward curvature is present in the thoracic spine. The sternum is within normal limits. No focal lytic or blastic lesions are present. Review of the MIP images confirms the above findings. CTA ABDOMEN AND PELVIS FINDINGS VASCULAR Aorta: Diffuse atherosclerotic changes are present. Plaque is present. Focal aneurysm, stenosis, or dissection is present. Celiac: Moderate stenosis is present proximal celiac artery. Branch vessels are unremarkable. SMA: Patent without evidence of aneurysm, dissection, vasculitis or significant stenosis. Renals: Both renal arteries  are patent without evidence of aneurysm, dissection, vasculitis, fibromuscular dysplasia or significant stenosis. IMA: Not visualized. Inflow: Extensive mural plaque is present on the left. Aneurysmal dilation measures up to 25 mm. Aneurysmal dilation of the right is 22 mm. Veins: No obvious venous abnormality within the limitations of this arterial phase study. Review of the MIP images confirms the above findings. NON-VASCULAR Hepatobiliary: Liver is unremarkable.  Gallstone again noted at the neck of the gallbladder without associated inflammatory change. The common bile duct is within normal limits. Pancreas: Unremarkable. No pancreatic ductal dilatation or surrounding inflammatory changes. Spleen: Normal in size without focal abnormality. Adrenals/Urinary Tract: Mild parenchymal thinning is noted bilaterally. No stone or mass lesion is present. Ureters are unremarkable. The urinary bladder is within limits. Stomach/Bowel: Stomach and duodenum are within normal limits. Small bowel is unremarkable. Terminal ileum is within normal limits. Appendix is surgically absent. Ascending and transverse colon are within normal limits. The descending and sigmoid colon are within limits. Lymphatic: No significant retroperitoneal adenopathy is present. Reproductive: Prostate is unremarkable. Other: No abdominal wall hernia or abnormality. No abdominopelvic ascites. Musculoskeletal: Grade 2 anterolisthesis is present at L5-S1 measuring 15 mm. Slight retrolisthesis present at L3-4 and L4-5. No focal lytic or blastic lesions are present. Bony pelvis is within normal limits. Hips are located and normal. Review of the MIP images confirms the above findings. IMPRESSION: 1. No aortic dissection. 2. Coronary artery disease. 3. 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 4. 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 5. Cholelithiasis without cholecystitis. 6. Grade 2 anterolisthesis at L5-S1 measuring 15 mm. 7. Aortic Atherosclerosis (ICD10-I70.0). Electronically Signed   By: San Morelle M.D.   On: 01/24/2020 23:02      Assessment & Plan:    Active Problems:   Chest pain   1. Chest pain 1. Patient is medically optimized on aspirin, Plavix, metoprolol, Crestor continue these 2. Patient also on Repatha in the ambulatory setting 3. Monitor on telemetry 4. Continue to monitor troponins 5. EKG in a.m. 6. Echo in a.m. 7. Last  echo was done in 2019 and showed concentric hypertrophy, EF 65 to 70%, wall motion normal, grade 2 diastolic dysfunction 8. Consult cardiology 2. Hypothyroidism 1. Continue levothyroxine 3. GERD 1. Continue PPI 4.    DVT Prophylaxis-   Heparin - SCDs  AM Labs Ordered, also please review Full Orders  Family Communication: No family at bedside code Status: Full Admission status: Observation        Time spent in minutes : Badger DO

## 2020-01-25 NOTE — Discharge Summary (Addendum)
Discharge Summary  Tony Hunt HKV:[id number] DOB: [birth year]  PCP: Tonia Ghent, MD  Admit date: 01/24/2020 Discharge date: 01/25/2020  Time spent: 35 minutes  Recommendations for Outpatient Follow-up:  1. Follow-up with your cardiologist within a week 2. Follow-up with your primary care provider in 1 to 2 weeks 3. Take your medications as prescribed  Discharge Diagnoses:  Active Hospital Problems   Diagnosis Date Noted  . Chest pain 01/25/2020    Resolved Hospital Problems  No resolved problems to display.    Discharge Condition: Stable  Diet recommendation: Resume previous diet.  Vitals:   01/25/20 0258 01/25/20 0759  BP: 131/88   Pulse: 79   Resp: 18   Temp: 98 F (36.7 C) 97.8 F (36.6 C)  SpO2: 96%     History of present illness:  Tony Hunt  is a 64 y.o. male, with history of sleep apnea, hypertension, hyperlipidemia, hiatal hernia, CAD, GERD, CKD, chronic diastolic CHF, carotid artery disease, anxiety, history of alcohol abuse quit 1 year ago presents to the ER with a chief complaint of chest pain.  Patient reports that he had sudden onset of chest pain that woke him out of sleep.  It felt like a pressure or heaviness.  It was in the middle of his chest and was associated with left arm paresthesias.  He had associated shortness of breath, nausea, dry heaving, diaphoresis, feeling flushed, and feeling chilled as well.  He denies palpitations.  He has had an MI before and this does not feel the same.  His MI was less painful than this.  Patient reports the pain was 10 out of 10 at its worst.  2 out of 10 now.  In EMS he was given 2 nitros which did not help at all, and morphine which she reports did not touch the pain either.  The pain lasted for total 2 hours.  During this time nothing made it better, nothing made it worse.  He thinks the Dilaudid is what eventually resolved the pain.  Patient reports that he had been in his normal state of health up until this  started.  He denies eating a big dinner for Thanksgiving, indigestion, or burning pain.  He denies any heavy lifting or possible causes of costochondritis.  Patient does report that he had a stent placed in September 2021 in his carotid artery.  He also had a stent in his heart 10 years ago.  His last stress test was 2 or 3 years ago.  He follows with [facility].  He is vaccinated for Covid.  He does not smoke.  He has a history of alcohol abuse but quit 1 year ago.  He denies illicit drug use.  Patient is full code.  01/25/20:The patient was admitted this morning by my partner Dr. Clearence Ped due to sudden onset substernal sharp chest pain at rest to rule out ACS.  Heart score 4.  Cardiology consulted.  Ongoing work-up thus far unrevealing.  First 3 sets of troponin negative.  Twelve-lead EKG no evidence of acute ischemia.  Not hypoxic or tachycardic.  Denies pleuritic pain.  2D echo has been ordered. At the time of this visit his chest pain is improved 1 out of 10.  Seen by cardiology, ok to discharge from cardiac standpoint.  CTA chest/abdomen/pelvis done on 01/24/2020 revealed: 1. No aortic dissection. 2. Coronary artery disease. 3. 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 4. 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 5. Cholelithiasis without cholecystitis. 6. Grade 2 anterolisthesis at L5-S1 measuring 15 mm. 7. Aortic Atherosclerosis (ICD10-I70.0).  2D echo done on 01/25/2020 revealed: 1. Left ventricular ejection fraction, by estimation, is 60 to 65%. The  left ventricle has normal function. The left ventricle has no regional  wall motion abnormalities. Left ventricular diastolic parameters are  consistent with Grade II diastolic  dysfunction (pseudonormalization). Elevated left atrial pressure.  2. Right ventricular systolic function is normal. The right ventricular  size is normal.  3. Left atrial size was mildly  dilated.  4. The mitral valve is normal in structure. No evidence of mitral valve  regurgitation. No evidence of mitral stenosis.  5. The aortic valve is normal in structure. Aortic valve regurgitation is  not visualized. No aortic stenosis is present.  6. The inferior vena cava is normal in size with greater than 50%  respiratory variability, suggesting right atrial pressure of 3 mmHg.   Hospital Course:  Active Problems:   Chest pain  Chest pain, ACS ruled out TropS negative x3 No evidence of PE No evidence of PNA or pneumothorax on imaging Started on Imdur by cardiology 15 mg daily on 01/25/20. Resume home cardiac meds Defer up-titrating of Lopressor 25 mg BID to cardiology outpatient since Imdur has not yet been taken in the hospital.  Latest BP 131/88, HR 87. Follow up with cardiology within a week. Hypothyroidism 1. Continue levothyroxine GERD 1. Continue PPI     Procedures:  None  Consultations:  Cardiology  Discharge Exam: BP 131/88 (BP Location: Right Arm)   Pulse 79   Temp 97.8 F (36.6 C)   Resp 18   Ht 5\' 7"  (1.702 m)   Wt 91 kg   SpO2 96%   BMI 31.43 kg/m  . General: 64 y.o. year-old male well developed well nourished in no acute distress.  Alert and oriented x3. . Cardiovascular: Regular rate and rhythm with no rubs or gallops.  No thyromegaly or JVD noted.   Marland Kitchen Respiratory: Clear to auscultation with no wheezes or rales. Good inspiratory effort. . Abdomen: Soft nontender nondistended with normal bowel sounds x4 quadrants. . Musculoskeletal: No lower extremity edema. 2/4 pulses in all 4 extremities. Marland Kitchen Psychiatry: Mood is appropriate for condition and setting  Discharge Instructions You were cared for by a hospitalist during your hospital stay. If you have any questions about your discharge medications or the care you received while you were in the hospital after you are discharged, you can call the unit and asked to speak with the hospitalist on  call if the hospitalist that took care of you is not available. Once you are discharged, your primary care physician will handle any further medical issues. Please note that NO REFILLS for any discharge medications will be authorized once you are discharged, as it is imperative that you return to your primary care physician (or establish a relationship with a primary care physician if you do not have one) for your aftercare needs so that they can reassess your need for medications and monitor your lab values.   Allergies as of 01/25/2020      Reactions   Imdur [isosorbide Dinitrate] Other (See Comments)   Headache at 60 mg dose, but able to tolerate a 30 mg per day   Iodine-131 Other (See Comments)   "Passed out"   Iohexol Anaphylaxis, Other (See Comments)   OK with 13 hour prep   Lisinopril Other (See Comments)   Elevated cr- improved off  med   Pravastatin Nausea Only, Other (See Comments)   Intense headache   Codeine Itching   Simvastatin Other (See Comments)   Edema   Statins Other (See Comments)   Significant edema on simvastatin      Medication List    TAKE these medications   aspirin EC 81 MG tablet Take 81 mg by mouth daily. Swallow whole.   clopidogrel 75 MG tablet Commonly known as: PLAVIX TAKE 1 TABLET DAILY WITH BREAKFAST What changed:   when to take this  additional instructions   cyanocobalamin 1000 MCG/ML injection Commonly known as: (VITAMIN B-12) AT THE START OF THERAPY INJECT WEEKLY FOR 4 WEEKS AND THEN MONTHLY THEREAFTER, FOR VITAMIN B12 DEFICIENCY What changed:   how much to take  how to take this  when to take this  additional instructions   fenofibrate 160 MG tablet TAKE 1 TABLET DAILY What changed: when to take this   gabapentin 100 MG capsule Commonly known as: NEURONTIN Take 100 mg by mouth daily as needed (for headaches).   gabapentin 600 MG tablet Commonly known as: Neurontin Take 1 tablet (600 mg total) by mouth at bedtime.     isosorbide mononitrate 30 MG 24 hr tablet Commonly known as: IMDUR Take 0.5 tablets (15 mg total) by mouth daily.   levothyroxine 100 MCG tablet Commonly known as: SYNTHROID TAKE 1 TABLET DAILY BEFORE BREAKFAST   melatonin 3 MG Tabs tablet Take 3 mg by mouth at bedtime as needed (for sleep).   metoprolol tartrate 25 MG tablet Commonly known as: LOPRESSOR TAKE ONE-HALF (1/2) TABLET TWICE A DAY What changed: See the new instructions.   nitroGLYCERIN 0.4 MG SL tablet Commonly known as: Nitrostat Place 1 tablet (0.4 mg total) under the tongue every 5 (five) minutes as needed for chest pain (do not exceed 3 pills during one episode). What changed: reasons to take this   pantoprazole 40 MG tablet Commonly known as: PROTONIX TAKE 1 TABLET DAILY What changed: when to take this   Repatha SureClick 998 MG/ML Soaj Generic drug: Evolocumab INJECT 1 PEN INTO THE SKIN EVERY 14 DAYS What changed: See the new instructions.   rosuvastatin 20 MG tablet Commonly known as: CRESTOR TAKE 1 TABLET DAILY What changed: when to take this   SYRINGE-NEEDLE (DISP) 3 ML 25G X 1" 3 ML Misc Patient is to use to self inject 1 ml of vitamin B12 per provider instructions.   tamsulosin 0.4 MG Caps capsule Commonly known as: FLOMAX TAKE 1 CAPSULE DAILY   traMADol 50 MG tablet Commonly known as: Ultram Take 1 tablet (50 mg total) by mouth every 6 (six) hours as needed. What changed: reasons to take this      Allergies  Allergen Reactions  . Imdur [Isosorbide Dinitrate] Other (See Comments)    Headache at 60 mg dose, but able to tolerate a 30 mg per day  . Iodine-131 Other (See Comments)    "Passed out"  . Iohexol Anaphylaxis and Other (See Comments)    OK with 13 hour prep  . Lisinopril Other (See Comments)    Elevated cr- improved off med  . Pravastatin Nausea Only and Other (See Comments)    Intense headache  . Codeine Itching  . Simvastatin Other (See Comments)    Edema  . Statins  Other (See Comments)    Significant edema on simvastatin    Follow-up Information    Tonia Ghent, MD. Call in 1 day(s).   Specialty: Family Medicine Why:  Call for a post hospital follow-up appointment. Contact information: [place] [address] [phone]        Nahser, Wonda Cheng, MD .   Specialty: Cardiology Contact information: [place] [address] [phone]                The results of significant diagnostics from this hospitalization (including imaging, microbiology, ancillary and laboratory) are listed below for reference.    Significant Diagnostic Studies: DG Chest Portable 1 View  Result Date: 01/24/2020 CLINICAL DATA:  Chest pain central chest radiating to left arm. Arm tingling. EXAM: PORTABLE CHEST 1 VIEW.  Patient is tilted to the left. COMPARISON:  Chest x-ray 10/08/2019, CT chest 06/30/2011 FINDINGS: The heart size and mediastinal contours are unchanged. Coronary artery stent. No focal consolidation. No pulmonary edema. No pleural effusion. No pneumothorax. No acute osseous abnormality. IMPRESSION: No active disease. Electronically Signed   By: Iven Finn M.D.   On: 01/24/2020 17:32   ECHOCARDIOGRAM COMPLETE  Result Date: 01/25/2020    ECHOCARDIOGRAM REPORT   Patient Name:   [name] Date of Exam: 01/25/2020 Medical Rec #:  [id number]     Height:       67.0 in Accession #:    [id number]    Weight:       200.7 lb Date of Birth:  16-Mar-1955     BSA:          2.025 m Patient Age:    81 years      BP:           131/88 mmHg Patient Gender: M             HR:           79 bpm. Exam Location:  Inpatient Procedure: 2D Echo Indications:    chest pain  History:        Patient has prior history of Echocardiogram examinations, most                 recent 11/17/2017. CHF, CAD; Risk Factors:Hypertension,                 Dyslipidemia and Former Smoker. ETOH ABUSE.  Sonographer:    Jannett Celestine RDCS (AE)  Referring Phys: [id number] ASIA B [place]  1. Left ventricular ejection fraction, by estimation, is 60 to 65%. The left ventricle has normal function. The left ventricle has no regional wall motion abnormalities. Left ventricular diastolic parameters are consistent with Grade II diastolic dysfunction (pseudonormalization). Elevated left atrial pressure.  2. Right ventricular systolic function is normal. The right ventricular size is normal.  3. Left atrial size was mildly dilated.  4. The mitral valve is normal in structure. No evidence of mitral valve regurgitation. No evidence of mitral stenosis.  5. The aortic valve is normal in structure. Aortic valve regurgitation is not visualized. No aortic stenosis is present.  6. The inferior vena cava is normal in size with greater than 50% respiratory variability, suggesting right atrial pressure of 3 mmHg. FINDINGS  Left Ventricle: Left ventricular ejection fraction, by estimation, is 60 to 65%. The left ventricle has normal function. The left ventricle has no regional wall motion abnormalities. The left ventricular internal cavity size was normal in size. There is  no left ventricular hypertrophy. Left ventricular diastolic parameters are consistent with Grade II diastolic dysfunction (pseudonormalization). Elevated left atrial pressure. Right Ventricle: The right ventricular size is normal. No increase  in right ventricular wall thickness. Right ventricular systolic function is normal. Left Atrium: Left atrial size was mildly dilated. Right Atrium: Right atrial size was normal in size. Pericardium: There is no evidence of pericardial effusion. Presence of pericardial fat pad. Mitral Valve: The mitral valve is normal in structure. No evidence of mitral valve regurgitation. No evidence of mitral valve stenosis. Tricuspid Valve: The tricuspid valve is normal in structure. Tricuspid valve regurgitation is not demonstrated. No evidence of tricuspid stenosis.  Aortic Valve: The aortic valve is normal in structure. Aortic valve regurgitation is not visualized. No aortic stenosis is present. Pulmonic Valve: The pulmonic valve was normal in structure. Pulmonic valve regurgitation is not visualized. No evidence of pulmonic stenosis. Aorta: The aortic root is normal in size and structure. Venous: The inferior vena cava is normal in size with greater than 50% respiratory variability, suggesting right atrial pressure of 3 mmHg. IAS/Shunts: No atrial level shunt detected by color flow Doppler.  LEFT VENTRICLE PLAX 2D LVIDd:         5.10 cm  Diastology LVIDs:         3.30 cm  LV e' medial:   4.00 cm/s LV PW:         1.10 cm  LV E/e' medial: 18.8 LV IVS:        1.10 cm LVOT diam:     2.45 cm LV SV:         81 LV SV Index:   40 LVOT Area:     4.71 cm  RIGHT VENTRICLE RV S prime:     13.70 cm/s TAPSE (M-mode): 1.9 cm LEFT ATRIUM             Index       RIGHT ATRIUM           Index LA diam:        4.20 cm 2.07 cm/m  RA Area:     17.70 cm LA Vol (A2C):   52.2 ml 25.77 ml/m RA Volume:   41.40 ml  20.44 ml/m LA Vol (A4C):   37.4 ml 18.47 ml/m LA Biplane Vol: 46.4 ml 22.91 ml/m  AORTIC VALVE LVOT Vmax:   67.90 cm/s LVOT Vmean:  44.800 cm/s LVOT VTI:    0.172 m  AORTA Ao Root diam: 3.40 cm MITRAL VALVE MV Area (PHT): 2.80 cm    SHUNTS MV Decel Time: 271 msec    Systemic VTI:  0.17 m MV E velocity: 75.00 cm/s  Systemic Diam: 2.45 cm MV A velocity: 71.10 cm/s MV E/A ratio:  1.05 Mihai Croitoru MD Electronically signed by Sanda Klein MD Signature Date/Time: 01/25/2020/12:09:36 PM    Final    CT Angio Chest/Abd/Pel for Dissection W and/or Wo Contrast  Result Date: 01/24/2020 CLINICAL DATA:  Chest pain. Back pain. Sudden onset. Hypertension. Aortic dissection suspected. EXAM: CT ANGIOGRAPHY CHEST, ABDOMEN AND PELVIS TECHNIQUE: Non-contrast CT of the chest was initially obtained. Multidetector CT imaging through the chest, abdomen and pelvis was performed using the standard  protocol during bolus administration of intravenous contrast. Multiplanar reconstructed images and MIPs were obtained and reviewed to evaluate the vascular anatomy. CONTRAST:  129mL OMNIPAQUE IOHEXOL 350 MG/ML SOLN COMPARISON:  CT of the abdomen and pelvis 3/15/1. CTA of the neck 09/07/19 FINDINGS: CTA CHEST FINDINGS Cardiovascular: Heart size is normal. Coronary artery calcifications are present. Atherosclerotic calcifications are present in the aortic arch and great vessel origins without focal stenosis or change. Extensive atherosclerotic changes are present in the descending  thoracic aorta with circumferential mucosal plaque. No aneurysm is present. No dissection is present. Pulmonary arteries are within normal limits. Mediastinum/Nodes: No significant mediastinal, hilar, or axillary adenopathy is present. Thoracic inlet is within limits. Esophagus is unremarkable. Lungs/Pleura: Mild dependent atelectasis is present bilaterally. No nodule mass lesion is present. No significant pleural effusion is present. Musculoskeletal: Remote posterior left-sided rib fractures are present. No acute abnormalities are present. Vertebral body heights and alignment are maintained. Leftward curvature is present in the thoracic spine. The sternum is within normal limits. No focal lytic or blastic lesions are present. Review of the MIP images confirms the above findings. CTA ABDOMEN AND PELVIS FINDINGS VASCULAR Aorta: Diffuse atherosclerotic changes are present. Plaque is present. Focal aneurysm, stenosis, or dissection is present. Celiac: Moderate stenosis is present proximal celiac artery. Branch vessels are unremarkable. SMA: Patent without evidence of aneurysm, dissection, vasculitis or significant stenosis. Renals: Both renal arteries are patent without evidence of aneurysm, dissection, vasculitis, fibromuscular dysplasia or significant stenosis. IMA: Not visualized. Inflow: Extensive mural plaque is present on the left.  Aneurysmal dilation measures up to 25 mm. Aneurysmal dilation of the right is 22 mm. Veins: No obvious venous abnormality within the limitations of this arterial phase study. Review of the MIP images confirms the above findings. NON-VASCULAR Hepatobiliary: Liver is unremarkable. Gallstone again noted at the neck of the gallbladder without associated inflammatory change. The common bile duct is within normal limits. Pancreas: Unremarkable. No pancreatic ductal dilatation or surrounding inflammatory changes. Spleen: Normal in size without focal abnormality. Adrenals/Urinary Tract: Mild parenchymal thinning is noted bilaterally. No stone or mass lesion is present. Ureters are unremarkable. The urinary bladder is within limits. Stomach/Bowel: Stomach and duodenum are within normal limits. Small bowel is unremarkable. Terminal ileum is within normal limits. Appendix is surgically absent. Ascending and transverse colon are within normal limits. The descending and sigmoid colon are within limits. Lymphatic: No significant retroperitoneal adenopathy is present. Reproductive: Prostate is unremarkable. Other: No abdominal wall hernia or abnormality. No abdominopelvic ascites. Musculoskeletal: Grade 2 anterolisthesis is present at L5-S1 measuring 15 mm. Slight retrolisthesis present at L3-4 and L4-5. No focal lytic or blastic lesions are present. Bony pelvis is within normal limits. Hips are located and normal. Review of the MIP images confirms the above findings. IMPRESSION: 1. No aortic dissection. 2. Coronary artery disease. 3. 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 4. 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 5. Cholelithiasis without cholecystitis. 6. Grade 2 anterolisthesis at L5-S1 measuring 15 mm. 7. Aortic Atherosclerosis (ICD10-I70.0). Electronically Signed   By: San Morelle M.D.   On: 01/24/2020 23:02    Microbiology: Recent Results (from the past  240 hour(s))  Resp Panel by RT-PCR (Flu A&B, Covid) Nasopharyngeal Swab     Status: None   Collection Time: 01/25/20 12:28 AM   Specimen: Nasopharyngeal Swab; Nasopharyngeal(NP) swabs in vial transport medium  Result Value Ref Range Status   SARS Coronavirus 2 by RT PCR NEGATIVE NEGATIVE Final    Comment: (NOTE) SARS-CoV-2 target nucleic acids are NOT DETECTED.  The SARS-CoV-2 RNA is generally detectable in upper respiratory specimens during the acute phase of infection. The lowest concentration of SARS-CoV-2 viral copies this assay can detect is 138 copies/mL. A negative result does not preclude SARS-Cov-2 infection and should not be used as the sole basis for treatment or other patient management decisions. A negative result may occur with  improper specimen collection/handling, submission of specimen other than nasopharyngeal swab,  presence of viral mutation(s) within the areas targeted by this assay, and inadequate number of viral copies(<138 copies/mL). A negative result must be combined with clinical observations, patient history, and epidemiological information. The expected result is Negative.  Fact Sheet for Patients:  EntrepreneurPulse.com.au  Fact Sheet for Healthcare Providers:  IncredibleEmployment.be  This test is no t yet approved or cleared by the [place] FDA and  has been authorized for detection and/or diagnosis of SARS-CoV-2 by FDA under an Emergency Use Authorization (EUA). This EUA will remain  in effect (meaning this test can be used) for the duration of the COVID-19 declaration under Section 564(b)(1) of the Act, 21 U.S.C.section 360bbb-3(b)(1), unless the authorization is terminated  or revoked sooner.       Influenza A by PCR NEGATIVE NEGATIVE Final   Influenza B by PCR NEGATIVE NEGATIVE Final    Comment: (NOTE) The Xpert Xpress SARS-CoV-2/FLU/RSV plus assay is intended as an aid in the diagnosis of influenza  from Nasopharyngeal swab specimens and should not be used as a sole basis for treatment. Nasal washings and aspirates are unacceptable for Xpert Xpress SARS-CoV-2/FLU/RSV testing.  Fact Sheet for Patients: EntrepreneurPulse.com.au  Fact Sheet for Healthcare Providers: IncredibleEmployment.be  This test is not yet approved or cleared by the [place] FDA and has been authorized for detection and/or diagnosis of SARS-CoV-2 by FDA under an Emergency Use Authorization (EUA). This EUA will remain in effect (meaning this test can be used) for the duration of the COVID-19 declaration under Section 564(b)(1) of the Act, 21 U.S.C. section 360bbb-3(b)(1), unless the authorization is terminated or revoked.  Performed at [facility], [address] [place], [place] [address]      Labs: Basic Metabolic Panel: Recent Labs  Lab 01/24/20 1710  NA 139  K 3.7  CL 106  CO2 23  GLUCOSE 161*  BUN 19  CREATININE 1.77*  CALCIUM 8.9   Liver Function Tests: Recent Labs  Lab 01/24/20 1710  AST 20  ALT 12  ALKPHOS 34*  BILITOT 0.5  PROT 6.1*  ALBUMIN 3.4*   Recent Labs  Lab 01/24/20 1710  LIPASE 34   No results for input(s): AMMONIA in the last 168 hours. CBC: Recent Labs  Lab 01/24/20 1710  WBC 5.8  NEUTROABS 3.7  HGB 14.1  HCT 43.3  MCV 93.7  PLT 276   Cardiac Enzymes: No results for input(s): CKTOTAL, CKMB, CKMBINDEX, TROPONINI in the last 168 hours. BNP: BNP (last 3 results) No results for input(s): BNP in the last 8760 hours.  ProBNP (last 3 results) Recent Labs    04/10/19 0840 10/08/19 0952  PROBNP 43.0 47.0    CBG: No results for input(s): GLUCAP in the last 168 hours.     Signed:  Kayleen Memos, MD [facility] 01/25/2020, 2:16 PM

## 2020-01-28 ENCOUNTER — Encounter: Payer: Self-pay | Admitting: Cardiovascular Disease

## 2020-01-28 ENCOUNTER — Other Ambulatory Visit: Payer: Self-pay | Admitting: Physician Assistant

## 2020-01-28 NOTE — Progress Notes (Signed)
Tony Hunt Date of Birth  21-Oct-1955       [facility] [address], [place], [place]  [address]   [place], [place]  [address] [phone]     [phone]   Fax  [phone]    Fax [phone]  Problem List: 1. CAD -  08/25/12 - 2.75 x 12 mm Promus Premier DES was deployed in the mid LAD. The stent was post-dilated with a 3.0 x 9 mm [place] balloon - Mid LAD. 2. Hypertension 3. Hypothyroidism 4. hyperlipidemia  Previous Notes :   Tony Hunt is a 64 yo with hx of progressive CP.  These pains have progressed over the past several weeks.  Initially he had only one or 2 times per week. These pains have progressed and he now has several episodes of chest pain each day.  The pains are associated with some dyspnea and lightheadness.   They are not associated with exercise, eating, drinking, change of position. The pains seem to last about 1 minute.  He has not found anything that specifically relieves the pain. They tend to resolve spontaneously.  He had a particularly bad episode of chest discomfort early this week and went to the emergency room. His workup there was unremarkable.  He's had progressive angina. He had an episode of chest pain the office today. The there were no associated EKG changes.  August 30, 2012:  Tony Hunt was seen last week with [facility].  He had a stent placed in his mid left anterior descending artery. He was discharged the next day but started having severe headaches and night sweats. Initially he thought it may be due to the Plavix but ultimately he decided that it seemed to be more related to the Pravachol.  He's feeling much better at this time.  Sept. 23, 2014:  He had several weeks of intermittant CP ( lasted 2-3 minutes) after getting his stent.  Now , these have resolved.  He is back doing all of his normal activities without CP.  He joined the [employer] last week.  He has been 1 time.   He has noticed  some easy bleeding.    11/22/2013:  He has been having more angina CP - typically occurs at rest.  Lasts a few seconds.  Occasionally longer.  Relieved with NTG Walking lots. 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   [facility] .   Dec. 18, 2015: And 1 was having some chest pain when I last done in September. Cardiac admission did not reveal any significant stenosis. History of PCI.  Has been doing better. Not exercising as much.  No trouble deer hunting this past fall.   August 27, 2014:  Doing well.  Hunts and fishes regularly . No angina   Jan. 17, 2017:  Tony Hunt is seen for follow up of his CAD' No significant episodes of CP  Still active.    Oct. 16, 2017:  Tony Hunt is seen today for follow up of his CAD. 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  May 26, 2016:  Tony Hunt has had marked dyspnea at rest and with exertion.  No fever, no cough Chest tightness / pain .   radiates through to back .  Last 5-10 minutes, less if he takes SL NTG .  Similar to angina  prior to stenting in 2014  Has been going on for 2 weeks.   Got worse this weekend Took 3 SL NTG.    August 05, 2016:  Seen for follow up of his CAD, hypertension and hyperlipidemia. Still having some dyspnea and DOE Cath showed no significant CAD Former smoker , quit 7 years ago   March 16, 2017:  Tony Hunt is seen today  No CP or dyspnea.   [facility] to the [facility] several times a week   Hunted a lot this past winter   Sept. 3, 2020  Tony Hunt is seen today  Hx of CAD - PCI in the past  No CP , no dyspnea Some exercise.  Walks near [place].   Recent labs  Creatinine is mildly elevated, - chronic issue for him  Chol = 184 HDL is 34 LDL = 106    Sept. 7, 2021:  Tony Hunt is seen for follow up of his CAD, AAA ,  Had a   Right carotid artery transcatheter angioplasty with stent placement (right TCAR, transcarotid stent system) by  Dr. Carlis Abbott . Getting along fairly well   Nov. 29, 2021: Tony Hunt is seen today for follow up of his cad, AAA,  Had right carotid stenting by Dr Carlis Abbott He was recently in the hosptital with chest pain lasting 6 hours Troponin was negative Has ckd - Creatinine is 1.77 Chol is very well controlled .  LDL is 12  Was hospitalized with chest pain  Woke from a nap like a "knee in his chest " Lasted for several hours ,  Dry heaving . Tried 2 SL NTG - ddi not help  No sweats, no dyspnea  troponins were negative x 3 Has not had any pain since. Had not had anything to eat that day .  He is back doing his normal activities now.  Has not been exercising .  Usually walks quite a bit   Current Outpatient Medications on File Prior to Visit  Medication Sig Dispense Refill   aspirin EC 81 MG tablet Take 81 mg by mouth daily. Swallow whole.     clopidogrel (PLAVIX) 75 MG tablet TAKE 1 TABLET DAILY WITH BREAKFAST (Patient taking differently: Take 75 mg by mouth See admin instructions. Take 75 mg by mouth in the morning with breakfast and should NOT be held prior to cath procedure unless specifically ordered) 90 tablet 3   cyanocobalamin (,VITAMIN B-12,) 1000 MCG/ML injection AT THE START OF THERAPY INJECT WEEKLY FOR 4 WEEKS AND THEN MONTHLY THEREAFTER, FOR VITAMIN B12 DEFICIENCY (Patient taking differently: Inject 1,000 mcg into the skin every 30 (thirty) days. ) 3 mL 3   fenofibrate 160 MG tablet TAKE 1 TABLET DAILY (Patient taking differently: Take 160 mg by mouth at bedtime. ) 90 tablet 3   gabapentin (NEURONTIN) 100 MG capsule Take 100 mg by mouth daily as needed (for headaches).     gabapentin (NEURONTIN) 600 MG tablet Take 1 tablet (600 mg total) by mouth at bedtime.     isosorbide mononitrate (IMDUR) 30 MG 24 hr tablet Take 0.5 tablets (15 mg total) by mouth daily. 45 tablet 0   levothyroxine (SYNTHROID) 100 MCG tablet TAKE 1 TABLET DAILY BEFORE BREAKFAST (Patient taking differently: Take 100 mcg  by mouth daily before breakfast. ) 90 tablet 1   melatonin 3 MG TABS tablet Take 3 mg by mouth at bedtime as needed (for sleep).      metoprolol tartrate (LOPRESSOR) 25 MG tablet TAKE  ONE-HALF (1/2) TABLET TWICE A DAY (Patient taking differently: Take 12.5 mg by mouth 2 (two) times daily. ) 90 tablet 3   nitroGLYCERIN (NITROSTAT) 0.4 MG SL tablet Place 1 tablet (0.4 mg total) under the tongue every 5 (five) minutes as needed for chest pain (do not exceed 3 pills during one episode). (Patient taking differently: Place 0.4 mg under the tongue every 5 (five) minutes as needed for chest pain ("and do not exceed 3 pills during one episode"). ) 25 tablet 6   pantoprazole (PROTONIX) 40 MG tablet TAKE 1 TABLET DAILY (Patient taking differently: Take 40 mg by mouth daily before breakfast. ) 90 tablet 3   REPATHA SURECLICK 371 MG/ML SOAJ INJECT 1 PEN INTO THE SKIN EVERY 14 DAYS (Patient taking differently: Inject 140 mg into the vein every 14 (fourteen) days. ) 6 mL 3   rosuvastatin (CRESTOR) 20 MG tablet TAKE 1 TABLET DAILY (Patient taking differently: Take 20 mg by mouth at bedtime. ) 90 tablet 3   SYRINGE-NEEDLE, DISP, 3 ML 25G X 1" 3 ML MISC Patient is to use to self inject 1 ml of vitamin B12 per provider instructions. 10 each 1   tamsulosin (FLOMAX) 0.4 MG CAPS capsule TAKE 1 CAPSULE DAILY (Patient taking differently: Take 0.4 mg by mouth daily. ) 90 capsule 3   traMADol (ULTRAM) 50 MG tablet Take 1 tablet (50 mg total) by mouth every 6 (six) hours as needed. (Patient taking differently: Take 50 mg by mouth every 6 (six) hours as needed (for pain). ) 12 tablet 0   No current facility-administered medications on file prior to visit.    Allergies  Allergen Reactions   Imdur [Isosorbide Dinitrate] Other (See Comments)    Headache at 60 mg dose, but able to tolerate a 30 mg per day   Iodine-131 Other (See Comments)    "Passed out"   Iohexol Anaphylaxis and Other (See Comments)    OK with 13  hour prep   Lisinopril Other (See Comments)    Elevated cr- improved off med   Pravastatin Nausea Only and Other (See Comments)    Intense headache   Codeine Itching   Simvastatin Other (See Comments)    Edema   Statins Other (See Comments)    Significant edema on simvastatin    Past Medical History:  Diagnosis Date   AAA (abdominal aortic aneurysm) without rupture ([place]) 02/14/2014   [place] 1/19:  AAA 3 cm   ALCOHOL ABUSE, HX OF    distant history   Anxiety    occ panic sx, increased after death of mother   ANXIETY DEPRESSION 05/10/2007   history of, during difficult relationship   CAD (coronary artery disease) 08/28/2012   08/25/2012 Successful PTCA/DES x mid LAD // LAD prox 30, mid stent patent; D1 40; LCx ok; OM2 50, 50; RCA prox 20; EF 55-65  // Nuclear stress test 9/19: EF 58, small inferobasal infarct, no ischemia, Low Risk (inf defect ? 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 Melvyn Novas, pulmonary     Past Surgical History:  Procedure Laterality Date   APPENDECTOMY     BACK SURGERY  1997   lower back x3   CARPOMETACARPAL ([place]) FUSION OF THUMB Left 04/02/2014   Procedure: CARPOMETACARPAL ([place]) FUSION OF THUMB/LEFT THUMB INTERPHALNGEAL JOINT FUSION;  Surgeon: Jolyn Nap, MD;  Location: [facility];  Service: Orthopedics;  Laterality: Left;   COLONOSCOPY     CORONARY STENT PLACEMENT     2.75 x 12 mm Promus Premier DES was deployed in the mid LAD. The stent was post-dilated with a 3.0 x 9 mm [place] balloon - Mid LAD.   LARYNGOSCOPY / BRONCHOSCOPY / ESOPHAGOSCOPY  2012   bx   LEFT HEART CATH AND CORONARY ANGIOGRAPHY N/A 05/28/2016   Procedure: Left Heart Cath and Coronary Angiography;  Surgeon: Belva Crome, MD;  Location: [facility] CV LAB;  Service: Cardiovascular;  Laterality: N/A;   LEFT HEART CATHETERIZATION WITH CORONARY ANGIOGRAM N/A 08/25/2012   Procedure: LEFT HEART CATHETERIZATION WITH CORONARY ANGIOGRAM;  Surgeon: Burnell Blanks, MD;  Location: [facility] C[facility] LAB;  Service: Cardiovascular;  Laterality: N/A;   LEFT HEART CATHETERIZATION WITH CORONARY ANGIOGRAM N/A 11/23/2013   Procedure: LEFT HEART CATHETERIZATION WITH CORONARY ANGIOGRAM;  Surgeon: Jettie Booze, MD;  Location[facility] C[facility] LAB;  Service: Cardiovascular;  Laterality: N/A;   TRANSCAROTID ARTERY REVASCULARIZATION Right 10/15/2019   Procedure: RIGHT TRANSCAROTID ARTERY REVASCULARIZATION;  Surgeon: [name], MD;  Location: [facility];  Service: Vascular;  Laterality: Right;   ULTRASOUND GUIDANCE FOR VASCULAR ACCESS Left 10/15/2019   Procedure: ULTRASOUND GUIDANCE FOR VASCULAR ACCESS;  Surgeon: [name], MD;  Location: [facility] OR;  Service: Vascular;  Laterality: Left;   WRIST FRACTURE SURGERY  08/21/10   Right wrist x4    Social History   Tobacco Use  Smoking Status Former Smoker   Quit date: 09/30/2010   Years since quitting: 9.3   Smokeless Tobacco 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  Social History   Substance and Sexual Activity  Alcohol Use Not Currently   Comment: occ     Family History  Problem Relation Age of Onset   Brain cancer Father    Dementia Father        h/o brain tumor   Heart disease Mother    Hypertension Mother    Heart disease Brother    Hypertension Brother    Hyperlipidemia Brother    Colon polyps Brother    Colon cancer Neg Hx    Prostate cancer Neg Hx    Esophageal cancer Neg Hx    Pancreatic cancer Neg Hx    Liver disease Neg Hx    Throat cancer Neg Hx     Reviw of Systems:  Reviewed in the HPI.  All  other systems are negative.  Physical Exam: There were no vitals taken for this visit.  GEN:  Well nourished, well developed in no acute distress HEENT: Normal NECK: No JVD; No carotid bruits LYMPHATICS: No lymphadenopathy CARDIAC: RRR , no murmurs, rubs, gallops RESPIRATORY:  Clear to auscultation without rales, wheezing or rhonchi  ABDOMEN: Soft, non-tender, non-distended MUSCULOSKELETAL:  No edema; No deformity  SKIN: Warm and dry NEUROLOGIC:  Alert and oriented x 3   ECG:       Assessment / Plan:   1. CAD -  08/25/12 -   S/p stenting  Had a recent hospitalization for cp Troponin levels were negative.  The pain did not respond to nitroglycerin.  Given his severe symptoms of chest pressure and his history of angina I would like to do a [employer] study for further evaluation.   2. Hypertension -   blood pressure is well controlled.  3. Hypothyroidism:   Managed by his primary medical doctor.   4. Hyperlipidemia: Lipids from September, 2021 reveal a total cholesterol of 71.  His HDL is 30.  The LDL is 12.  Triglycerides are 182.  I encouraged him to work on cutting back on his carbohydrates and fatty foods.  Cont. Repatha    5.  PAD:   S/p stenting of his right  carotid artery .  Is back on ASA , plavix,    Mertie Moores, MD  01/28/2020 8:41 PM    [facility] [place],  [place], [place]  [address] Pager [phone] Phone: [phone]; Fax: [phone]

## 2020-01-29 ENCOUNTER — Ambulatory Visit (INDEPENDENT_AMBULATORY_CARE_PROVIDER_SITE_OTHER): Payer: Medicare Other | Admitting: Cardiovascular Disease

## 2020-01-29 ENCOUNTER — Other Ambulatory Visit: Payer: Self-pay

## 2020-01-29 ENCOUNTER — Encounter: Payer: Self-pay | Admitting: Cardiovascular Disease

## 2020-01-29 ENCOUNTER — Encounter: Payer: Self-pay | Admitting: Nurse Practitioner

## 2020-01-29 VITALS — BP 128/68 | HR 64 | Ht 67.0 in | Wt 197.6 lb

## 2020-01-29 DIAGNOSIS — I6521 Occlusion and stenosis of right carotid artery: Secondary | ICD-10-CM | POA: Diagnosis not present

## 2020-01-29 DIAGNOSIS — I25118 Atherosclerotic heart disease of native coronary artery with other forms of angina pectoris: Secondary | ICD-10-CM | POA: Diagnosis not present

## 2020-01-29 NOTE — Patient Instructions (Addendum)
Medication Instructions:  Your physician recommends that you continue on your current medications as directed. Please refer to the Current Medication list given to you today.  *If you need a refill on your cardiac medications before your next appointment, please call your pharmacy*   Lab Work: None Ordered If you have labs (blood work) drawn today and your tests are completely normal, you will receive your results only by: Marland Kitchen MyChart Message (if you have MyChart) OR . A paper copy in the mail If you have any lab test that is abnormal or we need to change your treatment, we will call you to review the results.   Testing/Procedures: Your physician has requested that you have a lexiscan myoview. For further information please visit [email or web address]. Please follow instruction sheet, as given.    Follow-Up: At [facility], you and your health needs are our priority.  As part of our continuing mission to provide you with exceptional heart care, we have created designated Provider Care Teams.  These Care Teams include your primary Cardiologist (physician) and Advanced Practice Providers (APPs -  Physician Assistants and Nurse Practitioners) who all work together to provide you with the care you need, when you need it.   Your next appointment:   6 month(s) on Friday June 3 at 8:00 am  The format for your next appointment:   In Person  Provider:   Mertie Moores, MD

## 2020-02-06 ENCOUNTER — Telehealth (HOSPITAL_COMMUNITY): Payer: Self-pay | Admitting: *Deleted

## 2020-02-06 NOTE — Telephone Encounter (Signed)
Left message on voicemail per DPR in reference to upcoming appointment scheduled on 02/11/20 at 7:45 with detailed instructions given per Myocardial Perfusion Study Information Sheet for the test. LM to arrive 15 minutes early, and that it is imperative to arrive on time for appointment to keep from having the test rescheduled. If you need to cancel or reschedule your appointment, please call the office within 24 hours of your appointment. Failure to do so may result in a cancellation of your appointment, and a $50 no show fee. Phone number given for call back for any questions.

## 2020-02-07 ENCOUNTER — Other Ambulatory Visit: Payer: Self-pay | Admitting: Physician Assistant

## 2020-02-08 DIAGNOSIS — L578 Other skin changes due to chronic exposure to nonionizing radiation: Secondary | ICD-10-CM | POA: Diagnosis not present

## 2020-02-08 DIAGNOSIS — L570 Actinic keratosis: Secondary | ICD-10-CM | POA: Diagnosis not present

## 2020-02-11 ENCOUNTER — Ambulatory Visit (HOSPITAL_COMMUNITY): Payer: Medicare Other | Attending: Internal Medicine

## 2020-02-11 ENCOUNTER — Other Ambulatory Visit: Payer: Self-pay

## 2020-02-11 DIAGNOSIS — I25118 Atherosclerotic heart disease of native coronary artery with other forms of angina pectoris: Secondary | ICD-10-CM | POA: Diagnosis not present

## 2020-02-11 LAB — MYOCARDIAL PERFUSION IMAGING
LV dias vol: 104 mL (ref 62–150)
LV sys vol: 47 mL
Peak HR: 69 {beats}/min
Rest HR: 50 {beats}/min
SDS: 0
SRS: 0
SSS: 0
TID: 1.19

## 2020-02-11 MED ORDER — REGADENOSON 0.4 MG/5ML IV SOLN
0.4000 mg | Freq: Once | INTRAVENOUS | Status: AC
  Administered 2020-02-11: 09:00:00 0.4 mg via INTRAVENOUS

## 2020-02-11 MED ORDER — TECHNETIUM TC 99M TETROFOSMIN IV KIT
31.5000 | PACK | Freq: Once | INTRAVENOUS | Status: AC | PRN
  Administered 2020-02-11: 31.5 via INTRAVENOUS
  Filled 2020-02-11: qty 32

## 2020-02-11 MED ORDER — TECHNETIUM TC 99M TETROFOSMIN IV KIT
10.7000 | PACK | Freq: Once | INTRAVENOUS | Status: AC | PRN
  Administered 2020-02-11: 10.7 via INTRAVENOUS
  Filled 2020-02-11: qty 11

## 2020-03-01 HISTORY — PX: CARPAL TUNNEL RELEASE: SHX101

## 2020-03-03 DIAGNOSIS — R519 Headache, unspecified: Secondary | ICD-10-CM | POA: Diagnosis not present

## 2020-03-03 DIAGNOSIS — M791 Myalgia, unspecified site: Secondary | ICD-10-CM | POA: Diagnosis not present

## 2020-03-03 DIAGNOSIS — M542 Cervicalgia: Secondary | ICD-10-CM | POA: Diagnosis not present

## 2020-03-03 DIAGNOSIS — G518 Other disorders of facial nerve: Secondary | ICD-10-CM | POA: Diagnosis not present

## 2020-04-22 ENCOUNTER — Other Ambulatory Visit: Payer: Self-pay | Admitting: Family Medicine

## 2020-04-22 NOTE — Telephone Encounter (Signed)
Pharmacy requests refill on: Pantoprazole 40 mg   LAST REFILL: 05/21/2019 (Q-90, R-3)  LAST OV: 12/27/2019 NEXT OV: Not Scheduled  PHARMACY: [facility]

## 2020-04-25 DIAGNOSIS — G518 Other disorders of facial nerve: Secondary | ICD-10-CM | POA: Diagnosis not present

## 2020-04-25 DIAGNOSIS — R519 Headache, unspecified: Secondary | ICD-10-CM | POA: Diagnosis not present

## 2020-04-25 DIAGNOSIS — M542 Cervicalgia: Secondary | ICD-10-CM | POA: Diagnosis not present

## 2020-04-25 DIAGNOSIS — M791 Myalgia, unspecified site: Secondary | ICD-10-CM | POA: Diagnosis not present

## 2020-05-13 DIAGNOSIS — L814 Other melanin hyperpigmentation: Secondary | ICD-10-CM | POA: Diagnosis not present

## 2020-05-13 DIAGNOSIS — L819 Disorder of pigmentation, unspecified: Secondary | ICD-10-CM | POA: Diagnosis not present

## 2020-05-13 DIAGNOSIS — D485 Neoplasm of uncertain behavior of skin: Secondary | ICD-10-CM | POA: Diagnosis not present

## 2020-05-13 DIAGNOSIS — C44329 Squamous cell carcinoma of skin of other parts of face: Secondary | ICD-10-CM | POA: Diagnosis not present

## 2020-05-15 DIAGNOSIS — Z923 Personal history of irradiation: Secondary | ICD-10-CM | POA: Diagnosis not present

## 2020-05-15 DIAGNOSIS — Z8521 Personal history of malignant neoplasm of larynx: Secondary | ICD-10-CM | POA: Diagnosis not present

## 2020-05-15 DIAGNOSIS — R252 Cramp and spasm: Secondary | ICD-10-CM | POA: Diagnosis not present

## 2020-05-15 DIAGNOSIS — J343 Hypertrophy of nasal turbinates: Secondary | ICD-10-CM | POA: Diagnosis not present

## 2020-05-15 DIAGNOSIS — J387 Other diseases of larynx: Secondary | ICD-10-CM | POA: Diagnosis not present

## 2020-06-09 ENCOUNTER — Telehealth: Payer: Self-pay | Admitting: Family Medicine

## 2020-06-09 NOTE — Telephone Encounter (Signed)
  LAST APPOINTMENT DATE: 04/22/2020   NEXT APPOINTMENT DATE:@Visit  date not found  MEDICATION: Pantoprazole 40 mg tab   PHARMACY: [facility]  Let patient know to contact pharmacy at the end of the day to make sure medication is ready.  Please notify patient to allow 48-72 hours to process  Encourage patient to contact the pharmacy for refills or they can request refills through [facility]:   LAST REFILL:  QTY:  REFILL DATE:    OTHER COMMENTS:    Okay for refill?  Please advise

## 2020-06-10 ENCOUNTER — Encounter: Payer: Self-pay | Admitting: Family Medicine

## 2020-06-10 DIAGNOSIS — C44329 Squamous cell carcinoma of skin of other parts of face: Secondary | ICD-10-CM | POA: Diagnosis not present

## 2020-06-18 ENCOUNTER — Other Ambulatory Visit: Payer: Self-pay | Admitting: Family Medicine

## 2020-06-19 ENCOUNTER — Other Ambulatory Visit: Payer: Self-pay | Admitting: *Deleted

## 2020-06-19 MED ORDER — PANTOPRAZOLE SODIUM 40 MG PO TBEC
1.0000 | DELAYED_RELEASE_TABLET | Freq: Every day | ORAL | 3 refills | Status: DC
Start: 2020-06-19 — End: 2020-07-04

## 2020-07-04 ENCOUNTER — Other Ambulatory Visit: Payer: Self-pay | Admitting: *Deleted

## 2020-07-04 MED ORDER — PANTOPRAZOLE SODIUM 40 MG PO TBEC
1.0000 | DELAYED_RELEASE_TABLET | Freq: Every day | ORAL | 3 refills | Status: DC
Start: 2020-07-04 — End: 2021-07-07

## 2020-07-29 DIAGNOSIS — R519 Headache, unspecified: Secondary | ICD-10-CM | POA: Diagnosis not present

## 2020-07-29 DIAGNOSIS — M542 Cervicalgia: Secondary | ICD-10-CM | POA: Diagnosis not present

## 2020-07-29 DIAGNOSIS — M791 Myalgia, unspecified site: Secondary | ICD-10-CM | POA: Diagnosis not present

## 2020-07-29 DIAGNOSIS — G518 Other disorders of facial nerve: Secondary | ICD-10-CM | POA: Diagnosis not present

## 2020-07-30 ENCOUNTER — Other Ambulatory Visit: Payer: Self-pay

## 2020-07-30 ENCOUNTER — Other Ambulatory Visit: Payer: Self-pay | Admitting: Family Medicine

## 2020-07-30 ENCOUNTER — Ambulatory Visit: Payer: Medicare Other

## 2020-07-30 ENCOUNTER — Other Ambulatory Visit (INDEPENDENT_AMBULATORY_CARE_PROVIDER_SITE_OTHER): Payer: Medicare Other

## 2020-07-30 DIAGNOSIS — E538 Deficiency of other specified B group vitamins: Secondary | ICD-10-CM

## 2020-07-30 DIAGNOSIS — E7849 Other hyperlipidemia: Secondary | ICD-10-CM

## 2020-07-30 DIAGNOSIS — Z125 Encounter for screening for malignant neoplasm of prostate: Secondary | ICD-10-CM

## 2020-07-30 DIAGNOSIS — E039 Hypothyroidism, unspecified: Secondary | ICD-10-CM | POA: Diagnosis not present

## 2020-07-30 LAB — CBC WITH DIFFERENTIAL/PLATELET
Basophils Absolute: 0 10*3/uL (ref 0.0–0.1)
Basophils Relative: 0.4 % (ref 0.0–3.0)
Eosinophils Absolute: 0 10*3/uL (ref 0.0–0.7)
Eosinophils Relative: 0.4 % (ref 0.0–5.0)
HCT: 40.3 % (ref 39.0–52.0)
Hemoglobin: 13.9 g/dL (ref 13.0–17.0)
Lymphocytes Relative: 23.1 % (ref 12.0–46.0)
Lymphs Abs: 1.6 10*3/uL (ref 0.7–4.0)
MCHC: 34.6 g/dL (ref 30.0–36.0)
MCV: 91.1 fl (ref 78.0–100.0)
Monocytes Absolute: 0.7 10*3/uL (ref 0.1–1.0)
Monocytes Relative: 10.5 % (ref 3.0–12.0)
Neutro Abs: 4.5 10*3/uL (ref 1.4–7.7)
Neutrophils Relative %: 65.6 % (ref 43.0–77.0)
Platelets: 276 10*3/uL (ref 150.0–400.0)
RBC: 4.42 Mil/uL (ref 4.22–5.81)
RDW: 13.2 % (ref 11.5–15.5)
WBC: 6.9 10*3/uL (ref 4.0–10.5)

## 2020-07-30 LAB — COMPREHENSIVE METABOLIC PANEL
ALT: 11 U/L (ref 0–53)
AST: 17 U/L (ref 0–37)
Albumin: 4.1 g/dL (ref 3.5–5.2)
Alkaline Phosphatase: 35 U/L — ABNORMAL LOW (ref 39–117)
BUN: 21 mg/dL (ref 6–23)
CO2: 26 mEq/L (ref 19–32)
Calcium: 9.3 mg/dL (ref 8.4–10.5)
Chloride: 107 mEq/L (ref 96–112)
Creatinine, Ser: 1.81 mg/dL — ABNORMAL HIGH (ref 0.40–1.50)
GFR: 38.59 mL/min — ABNORMAL LOW (ref >60.00)
Glucose, Bld: 90 mg/dL (ref 70–99)
Potassium: 3.9 mEq/L (ref 3.5–5.1)
Sodium: 141 mEq/L (ref 135–145)
Total Bilirubin: 0.6 mg/dL (ref 0.2–1.2)
Total Protein: 6.2 g/dL (ref 6.0–8.3)

## 2020-07-30 LAB — LIPID PANEL
Cholesterol: 83 mg/dL (ref 0–200)
HDL: 38.3 mg/dL — ABNORMAL LOW (ref >39.00)
LDL Cholesterol: 22 mg/dL (ref 0–99)
NonHDL: 44.76
Total CHOL/HDL Ratio: 2
Triglycerides: 113 mg/dL (ref 0.0–149.0)
VLDL: 22.6 mg/dL (ref 0.0–40.0)

## 2020-07-30 LAB — VITAMIN B12: Vitamin B-12: 566 pg/mL (ref 211–911)

## 2020-07-30 LAB — URIC ACID: Uric Acid, Serum: 6.3 mg/dL (ref 4.0–7.8)

## 2020-07-30 LAB — TSH: TSH: 0.85 u[IU]/mL (ref 0.35–4.50)

## 2020-07-30 LAB — PSA, MEDICARE: PSA: 0.59 ng/ml (ref 0.10–4.00)

## 2020-07-31 ENCOUNTER — Encounter: Payer: Self-pay | Admitting: Cardiovascular Disease

## 2020-07-31 NOTE — Progress Notes (Signed)
Tony Hunt Date of Birth  1955-03-20       [name] [address], [address], [place], [place]  [address]   [place], [place]  [address] [phone]     [phone]   Fax  [phone]    Fax [phone]  Problem List: 1. CAD -  08/25/12 - 2.75 x 12 mm Promus Premier DES was deployed in the mid LAD. The stent was post-dilated with a 3.0 x 9 mm Emer[place] Hunt balloon - Mid LAD. 2. Hypertension 3. Hypothyroidism 4. hyperlipidemia  Previous Notes :   Tony Hunt is a 65 yo with hx of progressive CP.  These pains have progressed over the past several weeks.  Initially he had only one or 2 times per week. These pains have progressed and he now has several episodes of chest pain each day.  The pains are associated with some dyspnea and lightheadness.   They are not associated with exercise, eating, drinking, change of position. The pains seem to last about 1 minute.  He has not found anything that specifically relieves the pain. They tend to resolve spontaneously.  He had a particularly bad episode of chest discomfort early this week and went to the emergency room. His workup there was unremarkable.  He's had progressive angina. He had an episode of chest pain the office today. The there were no associated EKG changes.  August 30, 2012:  Tony Hunt was seen last week with [facility].  He had a stent placed in his mid left anterior descending artery. He was discharged the next day but started having severe headaches and night sweats. Initially he thought it may be due to the Plavix but ultimately he decided that it seemed to be more related to the Pravachol.  He's feeling much better at this time.  Sept. 23, 2014:  He had several weeks of intermittant CP ( lasted 2-3 minutes) after getting his stent.  Now , these have resolved.  He is back doing all of his normal activities without CP.  He joined the [employer] last week.  He has been 1 time.   He has noticed  some easy bleeding.    11/22/2013:  He has been having more angina CP - typically occurs at rest.  Lasts a few seconds.  Occasionally longer.  Relieved with NTG Walking lots. 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   [facility] .   Dec. 18, 2015: And 1 was having some chest pain when I last done in September. Cardiac admission did not reveal any significant stenosis. History of PCI.  Has been doing better. Not exercising as much.  No trouble deer hunting this past fall.   August 27, 2014:  Doing well.  Hunts and fishes regularly . No angina   Jan. 17, 2017:  Tony Hunt is seen for follow up of his CAD' No significant episodes of CP  Still active.    Oct. 16, 2017:  Tony Hunt is seen today for follow up of his CAD. 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  May 26, 2016:  Tony Hunt has had marked dyspnea at rest and with exertion.  No fever, no cough Chest tightness / pain .   radiates through to back .  Last 5-10 minutes, less if he takes SL NTG .  Similar to angina  prior to stenting in 2014  Has been going on for 2 weeks.   Got worse this weekend Took 3 SL NTG.    August 05, 2016:  Seen for follow up of his CAD, hypertension and hyperlipidemia. Still having some dyspnea and DOE Cath showed no significant CAD Former smoker , quit 7 years ago   March 16, 2017:  Tony Hunt is seen today  No CP or dyspnea.   Going to the [facility] several times a week   Hunted a lot this past winter   Sept. 3, 2020  Tony Hunt is seen today  Hx of CAD - PCI in the past  No CP , no dyspnea Some exercise.  Walks near [place].   Recent labs  Creatinine is mildly elevated, - chronic issue for him  Chol = 184 HDL is 34 LDL = 106    Sept. 7, 2021:  Tony Hunt is seen for follow up of his CAD, AAA ,  Had a   Right carotid artery transcatheter angioplasty with stent placement (right TCAR, transcarotid stent system) by  Dr. Carlis Abbott . Getting along fairly well   Nov. 29, 2021: Tony Hunt is seen today for follow up of his cad, AAA,  Had right carotid stenting by Dr Carlis Abbott He was recently in the hosptital with chest pain lasting 6 hours Troponin was negative Has ckd - Creatinine is 1.77 Chol is very well controlled .  LDL is 12  Was hospitalized with chest pain  Woke from a nap like a "knee in his chest " Lasted for several hours ,  Dry heaving . Tried 2 SL NTG - ddi not help  No sweats, no dyspnea  troponins were negative x 3 Has not had any pain since. Had not had anything to eat that day .  He is back doing his normal activities now.  Has not been exercising .  Usually walks quite a bit   August 01, 2020: Tony Hunt is seen today for follow up of his CAD, AAA, carotid artery disease  No further cp . Walking quite a bit,  Lost 8 lbs over the past several weeks .   BP looks great .   Labs from June 1 look good LDL is 22, Chol is 83 HDL is 38  Creatinine is 1.81.   He follows with nephrology   Current Outpatient Medications on File Prior to Visit  Medication Sig Dispense Refill  . aspirin EC 81 MG tablet Take 81 mg by mouth daily. Swallow whole.    . clopidogrel (PLAVIX) 75 MG tablet TAKE 1 TABLET DAILY WITH BREAKFAST 90 tablet 3  . cyanocobalamin (,VITAMIN B-12,) 1000 MCG/ML injection AT THE START OF THERAPY INJECT WEEKLY FOR 4 WEEKS AND THEN MONTHLY THEREAFTER, FOR VITAMIN B12 DEFICIENCY 3 mL 3  . fenofibrate 160 MG tablet TAKE 1 TABLET DAILY 90 tablet 3  . gabapentin (NEURONTIN) 100 MG capsule Take 100 mg by mouth daily as needed (for headaches).    . gabapentin (NEURONTIN) 600 MG tablet Take 1 tablet (600 mg total) by mouth at bedtime.    . isosorbide mononitrate (IMDUR) 30 MG 24 hr tablet Take 0.5 tablets (15 mg total) by mouth daily. 45 tablet 0  . levothyroxine (SYNTHROID) 100 MCG tablet TAKE 1 TABLET DAILY BEFORE BREAKFAST 90 tablet 3  . melatonin 3 MG TABS tablet Take 3 mg by mouth at bedtime  as needed (for sleep).     . metoprolol tartrate (LOPRESSOR) 25 MG tablet  TAKE ONE-HALF (1/2) TABLET TWICE A DAY 90 tablet 3  . nitroGLYCERIN (NITROSTAT) 0.4 MG SL tablet Place 1 tablet (0.4 mg total) under the tongue every 5 (five) minutes as needed for chest pain (do not exceed 3 pills during one episode). 25 tablet 6  . pantoprazole (PROTONIX) 40 MG tablet Take 1 tablet (40 mg total) by mouth daily. 90 tablet 3  . REPATHA SURECLICK 734 MG/ML SOAJ INJECT 1 PEN INTO THE SKIN EVERY 14 DAYS 6 mL 3  . rosuvastatin (CRESTOR) 20 MG tablet TAKE 1 TABLET DAILY 90 tablet 3  . SYRINGE-NEEDLE, DISP, 3 ML 25G X 1" 3 ML MISC Patient is to use to self inject 1 ml of vitamin B12 per provider instructions. 10 each 1  . tamsulosin (FLOMAX) 0.4 MG CAPS capsule TAKE 1 CAPSULE DAILY 90 capsule 3  . traMADol (ULTRAM) 50 MG tablet Take 1 tablet (50 mg total) by mouth every 6 (six) hours as needed. 12 tablet 0   No current facility-administered medications on file prior to visit.    Allergies  Allergen Reactions  . Imdur [Isosorbide Dinitrate] Other (See Comments)    Headache at 60 mg dose, but able to tolerate a 30 mg per day  . Iodine-131 Other (See Comments)    "Passed out"  . Iohexol Anaphylaxis and Other (See Comments)    OK with 13 hour prep  . Lisinopril Other (See Comments)    Elevated cr- improved off med  . Pravastatin Nausea Only and Other (See Comments)    Intense headache  . Codeine Itching  . Simvastatin Other (See Comments)    Edema  . Statins Other (See Comments)    Significant edema on simvastatin    Past Medical History:  Diagnosis Date  . AAA (abdominal aortic aneurysm) without rupture ([facility]) 02/14/2014   [place] 1/19:  AAA 3 cm  . ALCOHOL ABUSE, HX OF    distant history  . Anxiety    occ panic sx, increased after death of mother  . ANXIETY DEPRESSION 05/10/2007   history of, during difficult relationship  . CAD (coronary artery disease) 08/28/2012   08/25/2012 Successful PTCA/DES x  mid LAD // LAD prox 30, mid stent patent; D1 40; LCx ok; OM2 50, 50; RCA prox 20; EF 55-65  // Nuclear stress test 9/19: EF 58, small inferobasal infarct, no ischemia, Low Risk (inf defect ? diaph atten?)   . 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 Carotid artery disease ([facility]) 09/12/2017   [place] 6/19:  R 60-79; L 1-39; R vertebral occluded; FU 12 months  . 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 Chronic diastolic CHF (congestive heart failure) ([facility]) 12/21/2017   Echo 11/17/2017 - Mild concentric LVH, EF 65-70, normal wall motion, grade 2 diastolic dysfunction, mild LAE, normal RVSF, trivial TR  . CKD (chronic kidney disease)    Nephrologist is Dr. Corliss Parish (10/03/19)  . Dilatation of aorta ([facility]) 02/14/2014  . Diverticulosis   . ESOPHAGEAL STRICTURE 05/10/2007  . GERD 05/10/2007  . Heart disease   . HIATAL HERNIA 05/10/2007  . History of echocardiogram    Echo 9/19: mild conc LVH, vigorous LVF, EF 65-70, Gr 2 DD, mild LAE, normal RVSF, trivial AI  . History of radiation therapy   . HYPERLIPIDEMIA 05/10/2007  . HYPERTENSION 05/10/2007  . 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 SLEEP APNEA 05/10/2007   lost 100lb-not now  . Upper  airway cough syndrome    Per Dr. Melvyn Novas, pulmonary     Past Surgical History:  Procedure Laterality Date  . APPENDECTOMY    . BACK SURGERY  1997   lower back x3  . CARPOMETACARPAL ([facility]) FUSION OF THUMB Left 04/02/2014   Procedure: CARPOMETACARPAL ([facility]) FUSION OF THUMB/LEFT THUMB INTERPHALNGEAL JOINT FUSION;  Surgeon: Jolyn Nap, MD;  Location: [facility];  Service: Orthopedics;  Laterality: Left;  . COLONOSCOPY    . CORONARY STENT PLACEMENT     2.75 x 12 mm Promus Premier DES was deployed in the mid LAD. The stent was post-dilated with a 3.0 x 9 mm [place] balloon - Mid LAD.  [facility] LARYNGOSCOPY / BRONCHOSCOPY / ESOPHAGOSCOPY  2012   bx  . LEFT HEART CATH AND CORONARY  ANGIOGRAPHY N/A 05/28/2016   Procedure: Left Heart Cath and Coronary Angiography;  Surgeon: Belva Crome, MD;  Location: [facility];  Service: Cardiovascular;  Laterality: N/A;  . LEFT HEART CATHETERIZATION WITH CORONARY ANGIOGRAM N/A 08/25/2012   Procedure: LEFT HEART CATHETERIZATION WITH CORONARY ANGIOGRAM;  Surgeon: Burnell Blanks, MD;  Location[facility];  Service: Cardiovascular;  Laterality: N/A;  . LEFT HEART CATHETERIZATION WITH CORONARY ANGIOGRAM N/A 11/23/2013   Procedure: LEFT HEART CATHETERIZATION WITH CORONARY ANGIOGRAM;  Surgeon: Jettie Booze, MD;  Location[facility];  Service: Cardiovascular;  Laterality: N/A;  . TRANSCAROTID ARTERY REVASCULARIZATION Right 10/15/2019   Procedure: RIGHT TRANSCAROTID ARTERY REVASCULARIZATION;  Surgeon: [name], MD;  Location: [facility];  Service: Vascular;  Laterality: Right;  . ULTRASOUND GUIDANCE FOR VASCULAR ACCESS Left 10/15/2019   Procedure: ULTRASOUND GUIDANCE FOR VASCULAR ACCESS;  Surgeon: [name], MD;  Location: [facility];  Service: Vascular;  Laterality: Left;  . WRIST FRACTURE SURGERY  08/21/10   Right wrist x4    Social History   Tobacco Use  Smoking Status Former Smoker  . Quit date: 09/30/2010  . Years since quitting: 9.8  Smokeless Tobacco 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  Social History   Substance and Sexual Activity  Alcohol Use Not Currently   Comment: occ     Family History  Problem Relation Age of Onset  . Brain cancer Father   . Dementia Father        h/o brain tumor  . Heart disease Mother   . Hypertension Mother   . Heart disease Brother   . Hypertension Brother   . Hyperlipidemia Brother   . Colon polyps Brother   . Colon cancer Neg Hx   . Prostate cancer Neg Hx   . Esophageal cancer Neg Hx   . Pancreatic cancer Neg Hx   . Liver disease Neg Hx   . Throat cancer Neg Hx     Reviw of  Systems:  Reviewed in the HPI.  All other systems are negative.  Physical Exam: Blood pressure 124/74, pulse (!) 55, height 5\' 7"  (1.702 m), weight 194 lb 3.2 oz (88.1 kg), SpO2 97 %.  GEN:  Well nourished, well developed in no acute distress HEENT: Normal NECK: No JVD; No carotid bruits LYMPHATICS: No lymphadenopathy CARDIAC: RRR , no murmurs, rubs, gallops RESPIRATORY:  Clear to auscultation without rales, wheezing or rhonchi  ABDOMEN: Soft, non-tender, non-distended MUSCULOSKELETAL:  No edema; No deformity  SKIN: Warm and dry NEUROLOGIC:  Alert and oriented x 3    ECG:  Assessment / Plan:   1. CAD -  08/25/12 -   S/p stenting  He is doing very well.  He is not having any episodes of angina.  He walks on a regular basis.   2. Hypertension -blood pressure is very well controlled.  Continue current medications.  3. Hypothyroidism:      4. Hyperlipidemia:  Currently still on PCSK9.  His lipids are very well controlled.   5.  PAD:   S/p stenting of his right carotid artery .  Is back on ASA , plavix,    6.  Chronic kidney disease: He is followed by nephrology.  He is not on any nephrotoxic agents.  We will see him again in 1 year.  Mertie Moores, MD  08/01/2020 8:19 AM    [facility] [place],  [place], [place]  [address] Pager [phone] Phone: [phone]; Fax: [phone]

## 2020-08-01 ENCOUNTER — Encounter: Payer: Self-pay | Admitting: Cardiovascular Disease

## 2020-08-01 ENCOUNTER — Other Ambulatory Visit: Payer: Self-pay

## 2020-08-01 ENCOUNTER — Ambulatory Visit (INDEPENDENT_AMBULATORY_CARE_PROVIDER_SITE_OTHER): Payer: Medicare Other | Admitting: Cardiovascular Disease

## 2020-08-01 VITALS — BP 124/74 | HR 55 | Ht 67.0 in | Wt 194.2 lb

## 2020-08-01 DIAGNOSIS — I2510 Atherosclerotic heart disease of native coronary artery without angina pectoris: Secondary | ICD-10-CM | POA: Diagnosis not present

## 2020-08-01 NOTE — Patient Instructions (Signed)

## 2020-08-07 ENCOUNTER — Encounter: Payer: Self-pay | Admitting: Family Medicine

## 2020-08-07 ENCOUNTER — Ambulatory Visit (INDEPENDENT_AMBULATORY_CARE_PROVIDER_SITE_OTHER): Payer: Medicare Other | Admitting: Family Medicine

## 2020-08-07 ENCOUNTER — Other Ambulatory Visit: Payer: Self-pay

## 2020-08-07 VITALS — BP 136/84 | HR 68 | Temp 98.3°F | Ht 67.0 in | Wt 195.0 lb

## 2020-08-07 DIAGNOSIS — E039 Hypothyroidism, unspecified: Secondary | ICD-10-CM

## 2020-08-07 DIAGNOSIS — I10 Essential (primary) hypertension: Secondary | ICD-10-CM | POA: Diagnosis not present

## 2020-08-07 DIAGNOSIS — Z0000 Encounter for general adult medical examination without abnormal findings: Secondary | ICD-10-CM | POA: Diagnosis not present

## 2020-08-07 DIAGNOSIS — Z23 Encounter for immunization: Secondary | ICD-10-CM

## 2020-08-07 DIAGNOSIS — E7849 Other hyperlipidemia: Secondary | ICD-10-CM

## 2020-08-07 DIAGNOSIS — I2510 Atherosclerotic heart disease of native coronary artery without angina pectoris: Secondary | ICD-10-CM

## 2020-08-07 DIAGNOSIS — E538 Deficiency of other specified B group vitamins: Secondary | ICD-10-CM

## 2020-08-07 DIAGNOSIS — Z7189 Other specified counseling: Secondary | ICD-10-CM

## 2020-08-07 DIAGNOSIS — N032 Chronic nephritic syndrome with diffuse membranous glomerulonephritis: Secondary | ICD-10-CM

## 2020-08-07 DIAGNOSIS — I779 Disorder of arteries and arterioles, unspecified: Secondary | ICD-10-CM

## 2020-08-07 MED ORDER — "SYRINGE/NEEDLE (DISP) 25G X 1"" 3 ML MISC": 1 refills | Status: DC

## 2020-08-07 MED ORDER — CYANOCOBALAMIN 1000 MCG/ML IJ SOLN: INTRAMUSCULAR | 3 refills | Status: DC

## 2020-08-07 MED ORDER — TAMSULOSIN HCL 0.4 MG PO CAPS
0.4000 mg | ORAL_CAPSULE | Freq: Every day | ORAL | 3 refills | Status: DC
Start: 2020-08-07 — End: 2021-08-14

## 2020-08-07 NOTE — Progress Notes (Signed)
This visit occurred during the SARS-CoV-2 public health emergency.  Safety protocols were in place, including screening questions prior to the visit, additional usage of staff PPE, and extensive cleaning of exam room while observing appropriate contact time as indicated for disinfecting solutions.  I have personally reviewed the Medicare Annual Wellness questionnaire and have noted 1. The patient's medical and social history 2. Their use of alcohol, tobacco or illicit drugs 3. Their current medications and supplements 4. The patient's functional ability including ADL's, fall risks, home safety risks and hearing or visual             impairment. 5. Diet and physical activities 6. Evidence for depression or mood disorders  The patients weight, height, BMI have been recorded in the chart and visual acuity is per eye clinic.  I have made referrals, counseling and provided education to the patient based review of the above and I have provided the pt with a written personalized care plan for preventive services.  Provider list updated- see scanned forms.  Routine anticipatory guidance given to patient.  See health maintenance. The possibility exists that previously documented standard health maintenance information may have been brought forward from a previous encounter into this note.  If needed, that same information has been updated to reflect the current situation based on today's encounter.    Flu 2021 Shingles discussed with patient PNA 2022 Tetanus 2014 COVID-vaccine previously done Colon cancer screening 2021 Prostate cancer screening-2022 Advance directive-wife designated patient were incapacitated. Cognitive function addressed- see scanned forms- and if abnormal then additional documentation follows.   B12 replacement done at home.  Needed refill on syringes and med.   Done at [facility].   History of CKD, followed by renal clinic.  Creatinine discussed with patient.  Stable.  He avoids  NSAIDs.  Elevated Cholesterol: Using medications without problems: yes Muscle aches: no Diet compliance: yes Exercise: yes  Hypertension:    Using medication without problems or lightheadedness: yes Chest pain with exertion:no Edema:no Short of breath: some occ SOB, not escalating and stable, this is chronic.    Hypothyroidism.  No neck mass.  He has stable changes with swallowing since radiation tx.  TSH wnl.  Labs d/w pt.    PMH and SH reviewed  Meds, vitals, and allergies reviewed.   ROS: Per HPI.  Unless specifically indicated otherwise in HPI, the patient denies:  General: fever. Eyes: acute vision changes ENT: sore throat Cardiovascular: chest pain Respiratory: SOB GI: vomiting GU: dysuria Musculoskeletal: acute back pain Derm: acute rash Neuro: acute motor dysfunction Psych: worsening mood Endocrine: polydipsia Heme: bleeding Allergy: hayfever  GEN: nad, alert and oriented HEENT: ncat NECK: supple w/o LA CV: rrr. PULM: ctab, no inc wob ABD: soft, +bs EXT: no edema SKIN: no acute rash

## 2020-08-07 NOTE — Patient Instructions (Signed)
Take care.  Glad to see you. Update me as needed.  

## 2020-08-10 NOTE — Assessment & Plan Note (Signed)
Advance directive-wife designated patient were incapacitated. 

## 2020-08-10 NOTE — Assessment & Plan Note (Signed)
Continue isosorbide metoprolol

## 2020-08-10 NOTE — Assessment & Plan Note (Signed)
Flu 2021 Shingles discussed with patient PNA 2022 Tetanus 2014 COVID-vaccine previously done Colon cancer screening 2021 Prostate cancer screening-2022 Advance directive-wife designated patient were incapacitated. Cognitive function addressed- see scanned forms- and if abnormal then additional documentation follows.

## 2020-08-10 NOTE — Assessment & Plan Note (Signed)
TSH normal ?- Continue levothyroxine ?

## 2020-08-10 NOTE — Assessment & Plan Note (Addendum)
Continue Crestor Repatha and fenofibrate.  Labs discussed with patient.

## 2020-08-10 NOTE — Assessment & Plan Note (Signed)
Per nephrology.  We will route recent labs to nephrology.  Avoid NSAIDs.

## 2020-08-10 NOTE — Assessment & Plan Note (Signed)
Would continue replacement.  Discussed with patient.

## 2020-08-10 NOTE — Assessment & Plan Note (Signed)
Followed by vascular clinic.  AAA evaluation per outside clinic.

## 2020-08-19 ENCOUNTER — Encounter (HOSPITAL_COMMUNITY): Payer: Medicare Other

## 2020-08-19 ENCOUNTER — Ambulatory Visit: Payer: Medicare Other | Admitting: Vascular Surgery

## 2020-09-02 ENCOUNTER — Other Ambulatory Visit: Payer: Self-pay

## 2020-09-02 ENCOUNTER — Ambulatory Visit (INDEPENDENT_AMBULATORY_CARE_PROVIDER_SITE_OTHER): Payer: Medicare Other | Admitting: Vascular Surgery

## 2020-09-02 ENCOUNTER — Ambulatory Visit (HOSPITAL_COMMUNITY)
Admission: RE | Admit: 2020-09-02 | Discharge: 2020-09-02 | Disposition: A | Discharge status: Home / Self Care | Payer: Medicare Other | Source: Ambulatory Visit | Attending: Vascular Surgery | Admitting: Vascular Surgery

## 2020-09-02 ENCOUNTER — Encounter: Payer: Self-pay | Admitting: Vascular Surgery

## 2020-09-02 VITALS — BP 143/80 | HR 54 | Temp 97.2°F | Resp 16 | Ht 67.0 in | Wt 191.0 lb

## 2020-09-02 DIAGNOSIS — I714 Abdominal aortic aneurysm, without rupture, unspecified: Secondary | ICD-10-CM

## 2020-09-02 DIAGNOSIS — I779 Disorder of arteries and arterioles, unspecified: Secondary | ICD-10-CM | POA: Diagnosis present

## 2020-09-02 NOTE — Progress Notes (Signed)
Patient name: Tony Hunt MRN: [id number] DOB: 02-Nov-1955 Sex: male  REASON FOR VISIT: 9 month follow-up status post right TCAR  HPI: Tony Hunt is a 65 y.o. male with history of abdominal aortic aneurysm, laryngeal cancer status post radiation, coronary disease status post remote PCI, hypertension, hyperlipidemia and heart failure that presents for 9 month follow-up after right TCAR.  He had a right TCAR procedure on 10/15/2019 for an asymptomatic high-grade stenosis greater than 80% in the setting of prior neck radiation.  He denies any neurologic events over the last 9 months.  He remains on aspirin statin Plavix.  We are also following a 3.6 cm abdominal aortic aneurysm.  This AAA was last studied last year in 2021.  Past Medical History:  Diagnosis Date   AAA (abdominal aortic aneurysm) without rupture ([place]) 02/14/2014   [place] 1/19:  AAA 3 cm   ALCOHOL ABUSE, HX OF    distant history   Anxiety    occ panic sx, increased after death of mother   ANXIETY DEPRESSION 05/10/2007   history of, during difficult relationship   CAD (coronary artery disease) 08/28/2012   08/25/2012 Successful PTCA/DES x mid LAD // LAD prox 30, mid stent patent; D1 40; LCx ok; OM2 50, 50; RCA prox 20; EF 55-65  // Nuclear stress test 9/19: EF 58, small inferobasal infarct, no ischemia, Low Risk (inf defect ? 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 Melvyn Novas, pulmonary     Past Surgical History:  Procedure Laterality Date   APPENDECTOMY     BACK SURGERY  1997   lower back x3   CARPOMETACARPAL ([place]) FUSION OF THUMB Left 04/02/2014   Procedure: CARPOMETACARPAL ([place]) FUSION OF THUMB/LEFT THUMB INTERPHALNGEAL JOINT FUSION;  Surgeon: Jolyn Nap, MD;  Location: [facility];  Service: Orthopedics;  Laterality: Left;   COLONOSCOPY     CORONARY STENT PLACEMENT     2.75 x 12 mm Promus Premier DES was deployed in the mid LAD. The stent was post-dilated with a 3.0 x 9 mm [place] balloon - Mid LAD.   LARYNGOSCOPY / BRONCHOSCOPY / ESOPHAGOSCOPY  2012   bx   LEFT HEART CATH AND CORONARY ANGIOGRAPHY N/A 05/28/2016   Procedure: Left Heart Cath and Coronary Angiography;  Surgeon: Belva Crome, MD;  Location: [facility];  Service: Cardiovascular;  Laterality: N/A;  LEFT HEART CATHETERIZATION WITH CORONARY ANGIOGRAM N/A 08/25/2012   Procedure: LEFT HEART CATHETERIZATION WITH CORONARY ANGIOGRAM;  Surgeon: Burnell Blanks, MD;  Location: [facility] CATH LAB;  Service: Cardiovascular;  Laterality: N/A;   LEFT HEART CATHETERIZATION WITH CORONARY ANGIOGRAM N/A 11/23/2013   Procedure: LEFT HEART CATHETERIZATION WITH CORONARY ANGIOGRAM;  Surgeon: Jettie Booze, MD;  Location: [facility] C[facility] LAB;  Service: Cardiovascular;  Laterality: N/A;   TRANSCAROTID ARTERY REVASCULARIZATION  Right 10/15/2019   Procedure: RIGHT TRANSCAROTID ARTERY REVASCULARIZATION;  Surgeon: [name], MD;  Location: [facility];  Service:  Vascular;  Laterality: Right;   ULTRASOUND GUIDANCE FOR VASCULAR ACCESS Left 10/15/2019   Procedure: ULTRASOUND GUIDANCE FOR VASCULAR ACCESS;  Surgeon: [name], MD;  Location: [facility];  Service: Vascular;  Laterality: Left;   WRIST FRACTURE SURGERY  08/21/10   Right wrist x4    Family History  Problem Relation Age of Onset   Brain cancer Father    Dementia Father        h/o brain tumor   Heart disease Mother    Hypertension Mother    Heart disease Brother    Hypertension Brother    Hyperlipidemia Brother    Colon polyps Brother    Colon cancer Neg Hx    Prostate cancer Neg Hx    Esophageal cancer Neg Hx    Pancreatic cancer Neg Hx    Liver disease Neg Hx    Throat cancer Neg Hx     SOCIAL HISTORY: Social History   Tobacco Use   Smoking status: Former    Pack years: 0.00    Types: Cigarettes    Quit date: 09/30/2010    Years since quitting: 9.9   Smokeless tobacco: Former  Substance Use Topics   Alcohol use: Not Currently    Comment: occ     Allergies  Allergen Reactions   Imdur [Isosorbide Dinitrate] Other (See Comments)    Headache at 60 mg dose, but able to tolerate a 30 mg per day   Iodine-131 Other (See Comments)    "Passed out"   Iohexol Anaphylaxis and Other (See Comments)    OK with 13 hour prep   Lisinopril Other (See Comments)    Elevated cr- improved off med   Pravastatin Nausea Only and Other (See Comments)    Intense headache   Codeine Itching   Simvastatin Other (See Comments)    Edema   Statins Other (See Comments)    Significant edema on simvastatin    Current Outpatient Medications  Medication Sig Dispense Refill   aspirin EC 81 MG tablet Take 81 mg by mouth daily. Swallow whole.     clopidogrel (PLAVIX) 75 MG tablet TAKE 1 TABLET DAILY WITH BREAKFAST 90 tablet 3   cyanocobalamin (,VITAMIN B-12,) 1000 MCG/ML injection INJECT MONTHLY FOR VITAMIN B12 DEFICIENCY 3 mL 3   fenofibrate 160 MG tablet TAKE 1 TABLET DAILY 90 tablet 3    gabapentin (NEURONTIN) 600 MG tablet Take 1 tablet (600 mg total) by mouth at bedtime.     levothyroxine (SYNTHROID) 100 MCG tablet TAKE 1 TABLET DAILY BEFORE BREAKFAST 90 tablet 3   melatonin 3 MG TABS tablet Take 3 mg by mouth at bedtime as needed (for sleep).      metoprolol tartrate (LOPRESSOR) 25 MG tablet TAKE ONE-HALF (1/2) TABLET TWICE A DAY 90 tablet 3   nitroGLYCERIN (NITROSTAT) 0.4 MG SL tablet Place 1 tablet (0.4 mg total) under the tongue every 5 (five) minutes as needed  for chest pain (do not exceed 3 pills during one episode). 25 tablet 6   pantoprazole (PROTONIX) 40 MG tablet Take 1 tablet (40 mg total) by mouth daily. 90 tablet 3   REPATHA SURECLICK 454 MG/ML SOAJ INJECT 1 PEN INTO THE SKIN EVERY 14 DAYS 6 mL 3   rosuvastatin (CRESTOR) 20 MG tablet TAKE 1 TABLET DAILY 90 tablet 3   SYRINGE-NEEDLE, DISP, 3 ML 25G X 1" 3 ML MISC Patient is to use to self inject 1 ml of vitamin B12 per provider instructions. 10 each 1   tamsulosin (FLOMAX) 0.4 MG CAPS capsule Take 1 capsule (0.4 mg total) by mouth daily. 90 capsule 3   gabapentin (NEURONTIN) 100 MG capsule Take 100 mg by mouth daily as needed (for headaches). (Patient not taking: Reported on 09/02/2020)     isosorbide mononitrate (IMDUR) 30 MG 24 hr tablet Take 0.5 tablets (15 mg total) by mouth daily. 45 tablet 0   No current facility-administered medications for this visit.    REVIEW OF SYSTEMS:  [X]  denotes positive finding, [ ]  denotes negative finding Cardiac  Comments:  Chest pain or chest pressure:    Shortness of breath upon exertion:    Short of breath when lying flat:    Irregular heart rhythm:        Vascular    Pain in calf, thigh, or hip brought on by ambulation:    Pain in feet at night that wakes you up from your sleep:     Blood clot in your veins:    Leg swelling:         Pulmonary    Oxygen at home:    Productive cough:     Wheezing:         Neurologic    Sudden weakness in arms or legs:     Sudden  numbness in arms or legs:     Sudden onset of difficulty speaking or slurred speech:    Temporary loss of vision in one eye:     Problems with dizziness:         Gastrointestinal    Blood in stool:     Vomited blood:         Genitourinary    Burning when urinating:     Blood in urine:        Psychiatric    Major depression:         Hematologic    Bleeding problems:    Problems with blood clotting too easily:        Skin    Rashes or ulcers:        Constitutional    Fever or chills:      PHYSICAL EXAM: Vitals:   09/02/20 0959 09/02/20 1001  BP: 135/80 (!) 143/80  Pulse: (!) 51 (!) 54  Resp: 16   Temp: (!) 97.2 F (36.2 C)   TempSrc: Temporal   SpO2: 96%   Weight: 191 lb (86.6 kg)   Height: 5\' 7"  (1.702 m)     GENERAL: The patient is a well-nourished male, in no acute distress. The vital signs are documented above. CARDIAC: There is a regular rate and rhythm.  VASCULAR:  Right neck incision healed without issue PULMONARY: No respiratory distress. ABDOMEN: Soft and non-tender. MUSCULOSKELETAL: There are no major deformities or cyanosis. NEUROLOGIC: No focal weakness or paresthesias are detected.  CN II-XII grossly intact.   DATA:   Carotid duplex today shows patent right carotid stent with no  stenosis and minimal disease in left ICA.  Assessment/Plan:  65 year old male presents for 9 month follow-up status post right TCAR on 10/15/2019 for an asymptomatic high-grade stenosis greater than 80% in the setting of previous neck radiation.  Carotid duplex today shows widely patent right carotid stent.  He has minimal disease in the left ICA.  Discussed I will see him again in 1 year with carotid duplex for continued surveillance.  He needs to remain on aspirin Plavix statin.  We will also get a AAA duplex in 1 year to follow 3.6 cm abdominal aortic aneurysm as well.   [name], MD [facility] Office:  [phone]

## 2020-09-16 DIAGNOSIS — M542 Cervicalgia: Secondary | ICD-10-CM | POA: Diagnosis not present

## 2020-09-16 DIAGNOSIS — R519 Headache, unspecified: Secondary | ICD-10-CM | POA: Diagnosis not present

## 2020-09-16 DIAGNOSIS — G518 Other disorders of facial nerve: Secondary | ICD-10-CM | POA: Diagnosis not present

## 2020-09-16 DIAGNOSIS — M791 Myalgia, unspecified site: Secondary | ICD-10-CM | POA: Diagnosis not present

## 2020-09-19 ENCOUNTER — Telehealth: Payer: Self-pay

## 2020-09-19 ENCOUNTER — Telehealth (INDEPENDENT_AMBULATORY_CARE_PROVIDER_SITE_OTHER): Payer: Medicare Other | Admitting: Nurse Practitioner

## 2020-09-19 ENCOUNTER — Encounter: Payer: Self-pay | Admitting: Nurse Practitioner

## 2020-09-19 VITALS — Ht 67.0 in | Wt 195.0 lb

## 2020-09-19 DIAGNOSIS — U071 COVID-19: Secondary | ICD-10-CM | POA: Insufficient documentation

## 2020-09-19 MED ORDER — MOLNUPIRAVIR EUA 200MG CAPSULE: 4.0000 | ORAL_CAPSULE | Freq: Two times a day (BID) | ORAL | 0 refills | Status: AC

## 2020-09-19 NOTE — Assessment & Plan Note (Signed)
Discussed different treatment options with patient.  Given his previous history, current medication regimen we mutually agreed to use molnupiravir medication. Did discuss using over-the-counter analgesics for fever and body aches.  Given patient's renal function did discuss only using acetaminophen 325 mg every 4 to 6 hours as needed. Discussed signs and symptoms which would warrant further office visits or emergency room visits.  Patient acknowledged.

## 2020-09-19 NOTE — Telephone Encounter (Signed)
Patient called and states he tested positive for COVID yesterday. He started showing sx Wednesday with body aches, sore throat, head aches, congestion and extremely fatigued. Patient states sx are starting to get worse. He has not started taking anything for sx yet. Patient has VV with Romilda Garret, NP today at 2:00 pm. Sending note to Allie Bossier, NP and Romilda Garret, NP as Juluis Rainier.

## 2020-09-19 NOTE — Progress Notes (Signed)
Patient ID: Tony Hunt, male    DOB: 03-16-1955, 65 y.o.   MRN: [id number]  Virtual visit completed through [place], a video enabled telemedicine application. Due to national recommendations of social distancing due to COVID-19, a virtual visit is felt to be most appropriate for this patient at this time. Reviewed limitations, risks, security and privacy concerns of performing a virtual visit and the availability of in person appointments. I also reviewed that there may be a patient responsible charge related to this service. The patient agreed to proceed.   Patient location: home Provider location: [place] at [facility], office Persons participating in this virtual visit: patient, provider   If any vitals were documented, they were collected by patient at home unless specified below.    Ht '5\' 7"'$  (1.702 m)   Wt 195 lb (88.5 kg)   BMI 30.54 kg/m    CC: COVID-19 infection Subjective:   HPI: JAYDENCE Hunt is a 65 y.o. male presenting for an acute video visit in regards to COVID-19 infection.  Pertinent medical history of hypertension, CAD, CHF and former smoker.  Patient became symptomatic on 09-16-2020.  Took a home test yesterday on 09-18-2020.  Patient endorses having fever, chills, sore throat, headache, nasal congestion, mild shortness of breath, decreased appetite, taste disturbances, and nausea.  Patient denies having chest pain, ear pain, diarrhea, vomiting, or abdominal pain.  Patient endorses having 3 COVID vaccines total. Patient denies having tried or taken any over-the-counter medications for symptom management.   Relevant past medical, surgical, family and social history reviewed and updated as indicated. Interim medical history since our last visit reviewed. Allergies and medications reviewed and updated. Outpatient Medications Prior to Visit  Medication Sig Dispense Refill   aspirin EC 81 MG tablet Take 81 mg by mouth daily. Swallow whole.     clopidogrel (PLAVIX) 75  MG tablet TAKE 1 TABLET DAILY WITH BREAKFAST 90 tablet 3   cyanocobalamin (,VITAMIN B-12,) 1000 MCG/ML injection INJECT MONTHLY FOR VITAMIN B12 DEFICIENCY 3 mL 3   fenofibrate 160 MG tablet TAKE 1 TABLET DAILY 90 tablet 3   gabapentin (NEURONTIN) 100 MG capsule Take 100 mg by mouth daily as needed (for headaches).     gabapentin (NEURONTIN) 600 MG tablet Take 1 tablet (600 mg total) by mouth at bedtime.     levothyroxine (SYNTHROID) 100 MCG tablet TAKE 1 TABLET DAILY BEFORE BREAKFAST 90 tablet 3   melatonin 3 MG TABS tablet Take 3 mg by mouth at bedtime as needed (for sleep).      metoprolol tartrate (LOPRESSOR) 25 MG tablet TAKE ONE-HALF (1/2) TABLET TWICE A DAY 90 tablet 3   nitroGLYCERIN (NITROSTAT) 0.4 MG SL tablet Place 1 tablet (0.4 mg total) under the tongue every 5 (five) minutes as needed for chest pain (do not exceed 3 pills during one episode). 25 tablet 6   pantoprazole (PROTONIX) 40 MG tablet Take 1 tablet (40 mg total) by mouth daily. 90 tablet 3   REPATHA SURECLICK [id number] MG/ML SOAJ INJECT 1 PEN INTO THE SKIN EVERY 14 DAYS 6 mL 3   rosuvastatin (CRESTOR) 20 MG tablet TAKE 1 TABLET DAILY 90 tablet 3   SYRINGE-NEEDLE, DISP, 3 ML 25G X 1" 3 ML MISC Patient is to use to self inject 1 ml of vitamin B12 per provider instructions. 10 each 1   tamsulosin (FLOMAX) 0.4 MG CAPS capsule Take 1 capsule (0.4 mg total) by mouth daily. 90 capsule 3   isosorbide mononitrate (IMDUR) 30  MG 24 hr tablet Take 0.5 tablets (15 mg total) by mouth daily. 45 tablet 0   No facility-administered medications prior to visit.     Per HPI unless specifically indicated in ROS section below Review of Systems  Constitutional:  Positive for appetite change, chills and fever.  HENT:  Positive for congestion, ear pain, sinus pressure and sore throat.   Respiratory:  Positive for shortness of breath.   Cardiovascular:  Negative for chest pain.  Gastrointestinal:  Positive for nausea. Negative for abdominal pain,  diarrhea and vomiting.  Musculoskeletal:  Positive for arthralgias and myalgias.  Objective:  Ht '5\' 7"'$  (1.702 m)   Wt 195 lb (88.5 kg)   BMI 30.54 kg/m   Wt Readings from Last 3 Encounters:  09/19/20 195 lb (88.5 kg)  09/02/20 191 lb (86.6 kg)  08/07/20 195 lb (88.5 kg)       Physical exam: Gen: alert, NAD, not ill appearing Pulm: speaks in complete sentences without increased work of breathing Psych: normal mood, normal thought content      Results for orders placed or performed in visit on 07/30/20  Lipid panel  Result Value Ref Range   Cholesterol 83 0 - 200 mg/dL   Triglycerides 113.0 0.0 - 149.0 mg/dL   HDL 38.30 (L) >39.00 mg/dL   VLDL 22.6 0.0 - 40.0 mg/dL   LDL Cholesterol 22 0 - 99 mg/dL   Total CHOL/HDL Ratio 2    NonHDL 44.76   PSA, Medicare  Result Value Ref Range   PSA 0.59 0.10 - 4.00 ng/ml  Uric acid  Result Value Ref Range   Uric Acid, Serum 6.3 4.0 - 7.8 mg/dL  TSH  Result Value Ref Range   TSH 0.85 0.35 - 4.50 uIU/mL  Vitamin B12  Result Value Ref Range   Vitamin B-12 566 211 - 911 pg/mL  Comprehensive metabolic panel  Result Value Ref Range   Sodium 141 135 - 145 mEq/L   Potassium 3.9 3.5 - 5.1 mEq/L   Chloride 107 96 - 112 mEq/L   CO2 26 19 - 32 mEq/L   Glucose, Bld 90 70 - 99 mg/dL   BUN 21 6 - 23 mg/dL   Creatinine, Ser 1.81 (H) 0.40 - 1.50 mg/dL   Total Bilirubin 0.6 0.2 - 1.2 mg/dL   Alkaline Phosphatase 35 (L) 39 - 117 U/L   AST 17 0 - 37 U/L   ALT 11 0 - 53 U/L   Total Protein 6.2 6.0 - 8.3 g/dL   Albumin 4.1 3.5 - 5.2 g/dL   GFR 38.59 (L) >60.00 mL/min   Calcium 9.3 8.4 - 10.5 mg/dL  CBC with Differential/Platelet  Result Value Ref Range   WBC 6.9 4.0 - 10.5 K/uL   RBC 4.42 4.22 - 5.81 Mil/uL   Hemoglobin 13.9 13.0 - 17.0 g/dL   HCT 40.3 39.0 - 52.0 %   MCV 91.1 78.0 - 100.0 fl   MCHC 34.6 30.0 - 36.0 g/dL   RDW 13.2 11.5 - 15.5 %   Platelets 276.0 150.0 - 400.0 K/uL   Neutrophils Relative % 65.6 43.0 - 77.0 %    Lymphocytes Relative 23.1 12.0 - 46.0 %   Monocytes Relative 10.5 3.0 - 12.0 %   Eosinophils Relative 0.4 0.0 - 5.0 %   Basophils Relative 0.4 0.0 - 3.0 %   Neutro Abs 4.5 1.4 - 7.7 K/uL   Lymphs Abs 1.6 0.7 - 4.0 K/uL   Monocytes Absolute 0.7 0.1 - 1.0  K/uL   Eosinophils Absolute 0.0 0.0 - 0.7 K/uL   Basophils Absolute 0.0 0.0 - 0.1 K/uL   Assessment & Plan:   Problem List Items Addressed This Visit   None    No orders of the defined types were placed in this encounter.  No orders of the defined types were placed in this encounter.   I discussed the assessment and treatment plan with the patient. The patient was provided an opportunity to ask questions and all were answered. The patient agreed with the plan and demonstrated an understanding of the instructions. The patient was advised to call back or seek an in-person evaluation if the symptoms worsen or if the condition fails to improve as anticipated.  Follow up plan: No follow-ups on file.  Romilda Garret, NP

## 2020-09-19 NOTE — Patient Instructions (Signed)
Pick up medications and take them as directed from the pharmacy. Call the office or reach out via my portal for any questions or concerns.  Thank you for allowing me to be a part of your healthcare journey. Follow-up as scheduled with Dr. Damita Dunnings, or sooner if symptoms fail to improve or worsen.

## 2020-09-29 ENCOUNTER — Other Ambulatory Visit: Payer: Self-pay | Admitting: Cardiovascular Disease

## 2020-10-03 ENCOUNTER — Telehealth: Payer: Medicare Other | Admitting: Nurse Practitioner

## 2020-10-20 DIAGNOSIS — L905 Scar conditions and fibrosis of skin: Secondary | ICD-10-CM | POA: Diagnosis not present

## 2020-10-20 DIAGNOSIS — L570 Actinic keratosis: Secondary | ICD-10-CM | POA: Diagnosis not present

## 2020-10-20 DIAGNOSIS — L814 Other melanin hyperpigmentation: Secondary | ICD-10-CM | POA: Diagnosis not present

## 2020-10-20 DIAGNOSIS — D229 Melanocytic nevi, unspecified: Secondary | ICD-10-CM | POA: Diagnosis not present

## 2020-10-20 DIAGNOSIS — Z85828 Personal history of other malignant neoplasm of skin: Secondary | ICD-10-CM | POA: Diagnosis not present

## 2020-11-05 ENCOUNTER — Other Ambulatory Visit: Payer: Self-pay

## 2020-11-05 ENCOUNTER — Encounter: Payer: Self-pay | Admitting: Family Medicine

## 2020-11-05 ENCOUNTER — Ambulatory Visit (INDEPENDENT_AMBULATORY_CARE_PROVIDER_SITE_OTHER): Payer: Medicare Other | Admitting: Family Medicine

## 2020-11-05 VITALS — BP 120/70 | HR 61 | Temp 97.9°F | Ht 69.5 in | Wt 191.5 lb

## 2020-11-05 DIAGNOSIS — I2510 Atherosclerotic heart disease of native coronary artery without angina pectoris: Secondary | ICD-10-CM | POA: Diagnosis not present

## 2020-11-05 DIAGNOSIS — M79641 Pain in right hand: Secondary | ICD-10-CM

## 2020-11-05 DIAGNOSIS — M659 Synovitis and tenosynovitis, unspecified: Secondary | ICD-10-CM

## 2020-11-05 MED ORDER — PREDNISONE 20 MG PO TABS: ORAL_TABLET | ORAL | 0 refills | Status: DC

## 2020-11-05 NOTE — Progress Notes (Signed)
Ivalene Platte T. Emina Ribaudo, MD, [place] [facility] [place], [address]  Phone: [phone]  FAX: [phone]  Tony Hunt - 65 y.o. male  MRN [id number]  Date of Birth: 05-24-1955  Date: 11/05/2020  PCP: Tonia Ghent, MD  Referral: Tonia Ghent, MD  Chief Complaint  Patient presents with   Hand Pain    Right is worse    This visit occurred during the SARS-CoV-2 public health emergency.  Safety protocols were in place, including screening questions prior to the visit, additional usage of staff PPE, and extensive cleaning of exam room while observing appropriate contact time as indicated for disinfecting solutions.   Subjective:   Tony Hunt is a 65 y.o. very pleasant male patient with Body mass index is 27.87 kg/m. who presents with the following:  He is a very nice gentleman he has been having some right greater than left hand pain and numbness for about 6 weeks.  He has not had any kind of inciting event.  No trauma.  Does have a remote history of a wrist fracture.  This was treated operatively.  This was many years ago.  R hand has a lot of numbness and pain - about six weeks.  Flexor aspect of arm through the wrist itself, and this is the predominant area of pain No significant numbness in the past.   Pain, 3rd and 4th flexor tendons with some acute pain and tingling.   R hand pain, tinging Dec grip on the R  Review of Systems is noted in the HPI, as appropriate  Objective:   BP 120/70   Pulse 61   Temp 97.9 F (36.6 C) (Temporal)   Ht 5' 9.5" (1.765 m)   Wt 191 lb 8 oz (86.9 kg)   SpO2 97%   BMI 27.87 kg/m   GEN: No acute distress; alert,appropriate. PULM: Breathing comfortably in no respiratory distress PSYCH: Normally interactive.   He does have some mild swelling in the true wrist joint bilaterally.  On the left side there is no significant focal tenderness throughout the upper  extremity. On the right hand there is decreased grip strength compared to the left.  He also does have some bogginess in the true wrist joint.  No significant synovitis on all MCP PIP, or DIP joints.  He is focally tender along the third and fourth flexor tendons all the way up into the forearm.  Nontender at the medial or lateral epicondyles.  Strength is preserved.  The exception is grip.   Laboratory and Imaging Data:  Assessment and Plan:     ICD-10-CM   1. Right hand pain  M79.641     2. Tenosynovitis of both hands  M65.9      There is no obvious cause, or repetitive motion tasks.  Multiple tendon sheaths are involved, right greater than left and I think that I would just like to globally treat this with some steroids.  If symptoms persist several weeks into the future, then additional work-up could be obtained.  Meds ordered this encounter  Medications   predniSONE (DELTASONE) 20 MG tablet    Sig: 2 tabs po daily for 5 days, then 1 tab po daily for 5 days    Dispense:  15 tablet    Refill:  0    Dragon [employer] One speech-to-text software was used for transcription in this dictation.  Possible transcriptional errors can occur using Dragon  software.   Signed,  Maud Deed. Daun Rens, MD   Outpatient Encounter Medications as of 11/05/2020  Medication Sig   aspirin EC 81 MG tablet Take 81 mg by mouth daily. Swallow whole.   clopidogrel (PLAVIX) 75 MG tablet TAKE 1 TABLET DAILY WITH BREAKFAST   cyanocobalamin (,VITAMIN B-12,) 1000 MCG/ML injection INJECT MONTHLY FOR VITAMIN B12 DEFICIENCY   fenofibrate 160 MG tablet TAKE 1 TABLET DAILY   gabapentin (NEURONTIN) 100 MG capsule Take 100 mg by mouth daily as needed (for headaches).   gabapentin (NEURONTIN) 600 MG tablet Take 1 tablet (600 mg total) by mouth at bedtime.   levothyroxine (SYNTHROID) 100 MCG tablet TAKE 1 TABLET DAILY BEFORE BREAKFAST   melatonin 3 MG TABS tablet Take 3 mg by mouth at bedtime as needed (for sleep).     metoprolol tartrate (LOPRESSOR) 25 MG tablet TAKE ONE-HALF (1/2) TABLET TWICE A DAY   nitroGLYCERIN (NITROSTAT) 0.4 MG SL tablet Place 1 tablet (0.4 mg total) under the tongue every 5 (five) minutes as needed for chest pain (do not exceed 3 pills during one episode).   pantoprazole (PROTONIX) 40 MG tablet Take 1 tablet (40 mg total) by mouth daily.   predniSONE (DELTASONE) 20 MG tablet 2 tabs po daily for 5 days, then 1 tab po daily for 5 days   REPATHA SURECLICK [id number] MG/ML SOAJ INJECT 1 PEN INTO THE SKIN EVERY 14 DAYS   rosuvastatin (CRESTOR) 20 MG tablet TAKE 1 TABLET DAILY   SYRINGE-NEEDLE, DISP, 3 ML 25G X 1" 3 ML MISC Patient is to use to self inject 1 ml of vitamin B12 per provider instructions.   tamsulosin (FLOMAX) 0.4 MG CAPS capsule Take 1 capsule (0.4 mg total) by mouth daily.   isosorbide mononitrate (IMDUR) 30 MG 24 hr tablet Take 0.5 tablets (15 mg total) by mouth daily.   No facility-administered encounter medications on file as of 11/05/2020.

## 2020-11-13 DIAGNOSIS — Z6833 Body mass index (BMI) 33.0-33.9, adult: Secondary | ICD-10-CM | POA: Diagnosis not present

## 2020-11-13 DIAGNOSIS — R809 Proteinuria, unspecified: Secondary | ICD-10-CM | POA: Diagnosis not present

## 2020-11-13 DIAGNOSIS — N051 Unspecified nephritic syndrome with focal and segmental glomerular lesions: Secondary | ICD-10-CM | POA: Diagnosis not present

## 2020-11-13 DIAGNOSIS — I129 Hypertensive chronic kidney disease with stage 1 through stage 4 chronic kidney disease, or unspecified chronic kidney disease: Secondary | ICD-10-CM | POA: Diagnosis not present

## 2020-12-02 ENCOUNTER — Other Ambulatory Visit: Payer: Self-pay | Admitting: Family Medicine

## 2020-12-05 ENCOUNTER — Ambulatory Visit: Payer: Medicare Other | Admitting: Family Medicine

## 2020-12-08 ENCOUNTER — Ambulatory Visit (INDEPENDENT_AMBULATORY_CARE_PROVIDER_SITE_OTHER): Payer: Medicare Other | Admitting: Family Medicine

## 2020-12-08 ENCOUNTER — Other Ambulatory Visit: Payer: Self-pay

## 2020-12-08 ENCOUNTER — Encounter: Payer: Self-pay | Admitting: Family Medicine

## 2020-12-08 VITALS — BP 120/64 | HR 66 | Temp 97.9°F | Ht 70.0 in | Wt 192.0 lb

## 2020-12-08 DIAGNOSIS — M79643 Pain in unspecified hand: Secondary | ICD-10-CM | POA: Diagnosis not present

## 2020-12-08 DIAGNOSIS — I2510 Atherosclerotic heart disease of native coronary artery without angina pectoris: Secondary | ICD-10-CM

## 2020-12-08 MED ORDER — GABAPENTIN 100 MG PO CAPS: 100.0000 mg | ORAL_CAPSULE | Freq: Three times a day (TID) | ORAL | Status: DC | PRN

## 2020-12-08 NOTE — Progress Notes (Signed)
This visit occurred during the SARS-CoV-2 public health emergency.  Safety protocols were in place, including screening questions prior to the visit, additional usage of staff PPE, and extensive cleaning of exam room while observing appropriate contact time as indicated for disinfecting solutions.  Had seen Dr. Lorelei Pont, took prednisone with minimal relief and the now pain is progressively worse.  Pain, stinging, can radiate up the arm.  No new meds o/w, except for farxiga and sx predate that.  R>>L hand pain/sx.    Still on B12 replacement.  Taking 600mg  gabapentin at night.  No use of 100mg  tabs.    He has some occ B foot tingling but the R hand is clearly worse.  He is R handed.    Meds, vitals, and allergies reviewed.   ROS: Per HPI unless specifically indicated in ROS section   [place] supple, no LA Rrr ctab Able to make a fist with R hand but clearly absent sensation to monofilament on the fingers R hand.  Normal cap refill.  Normal radial pulse.  No bruising.  No erythema.

## 2020-12-08 NOTE — Patient Instructions (Addendum)
Keep taking 600mg  gabapentin at night.   Try taking 100-200mg  up to 3 times a day in the daytime.  Sedation caution.   Update me in a few days.  We'll go from there.  Take care.  Glad to see you.  We'll call about seeing the hand clinic.

## 2020-12-10 ENCOUNTER — Telehealth: Payer: Self-pay | Admitting: Family Medicine

## 2020-12-10 DIAGNOSIS — M79643 Pain in unspecified hand: Secondary | ICD-10-CM | POA: Insufficient documentation

## 2020-12-10 NOTE — Assessment & Plan Note (Signed)
He could have tenosynovitis but he did not improve with prednisone.  The concern is for neuropathic pain given the stinging that can radiate up the arm.  I think it makes sense to increase his gabapentin by trying the 100 mg tablets during the day in addition to 600 mg at night.  Refer to the hand clinic.  He agrees with plan.

## 2020-12-10 NOTE — Telephone Encounter (Signed)
Pt callled stating that Dr Damita Dunnings told him to double up on medication gabapentin (NEURONTIN) 600 MG tablet, for two days  to see if that would help for the pain of pt right wrist. Pt states that Dr Damita Dunnings says if that doesn't work to call and Dr Damita Dunnings would get him a appt for [place].

## 2020-12-10 NOTE — Telephone Encounter (Signed)
Referral placed  Thanks!

## 2020-12-11 DIAGNOSIS — N179 Acute kidney failure, unspecified: Secondary | ICD-10-CM | POA: Diagnosis not present

## 2020-12-11 NOTE — Telephone Encounter (Signed)
Referral has been sent, awaiting an appt//ELEA

## 2020-12-16 DIAGNOSIS — R200 Anesthesia of skin: Secondary | ICD-10-CM | POA: Diagnosis not present

## 2020-12-30 DIAGNOSIS — L570 Actinic keratosis: Secondary | ICD-10-CM | POA: Diagnosis not present

## 2020-12-30 DIAGNOSIS — L0889 Other specified local infections of the skin and subcutaneous tissue: Secondary | ICD-10-CM | POA: Diagnosis not present

## 2020-12-30 DIAGNOSIS — Z85828 Personal history of other malignant neoplasm of skin: Secondary | ICD-10-CM | POA: Diagnosis not present

## 2020-12-30 DIAGNOSIS — L309 Dermatitis, unspecified: Secondary | ICD-10-CM | POA: Diagnosis not present

## 2020-12-30 DIAGNOSIS — L814 Other melanin hyperpigmentation: Secondary | ICD-10-CM | POA: Diagnosis not present

## 2020-12-30 DIAGNOSIS — D225 Melanocytic nevi of trunk: Secondary | ICD-10-CM | POA: Diagnosis not present

## 2020-12-30 DIAGNOSIS — Z08 Encounter for follow-up examination after completed treatment for malignant neoplasm: Secondary | ICD-10-CM | POA: Diagnosis not present

## 2020-12-30 DIAGNOSIS — L821 Other seborrheic keratosis: Secondary | ICD-10-CM | POA: Diagnosis not present

## 2020-12-30 DIAGNOSIS — B078 Other viral warts: Secondary | ICD-10-CM | POA: Diagnosis not present

## 2020-12-30 DIAGNOSIS — L503 Dermatographic urticaria: Secondary | ICD-10-CM | POA: Diagnosis not present

## 2021-01-14 DIAGNOSIS — R200 Anesthesia of skin: Secondary | ICD-10-CM | POA: Diagnosis not present

## 2021-01-22 ENCOUNTER — Other Ambulatory Visit: Payer: Self-pay | Admitting: Cardiovascular Disease

## 2021-01-23 ENCOUNTER — Encounter: Payer: Self-pay | Admitting: Family Medicine

## 2021-01-27 ENCOUNTER — Other Ambulatory Visit: Payer: Self-pay | Admitting: Cardiovascular Disease

## 2021-01-28 ENCOUNTER — Telehealth: Payer: Self-pay | Admitting: *Deleted

## 2021-01-28 DIAGNOSIS — G5601 Carpal tunnel syndrome, right upper limb: Secondary | ICD-10-CM | POA: Diagnosis not present

## 2021-01-28 NOTE — Telephone Encounter (Signed)
    Patient Name: Tony Hunt  DOB: 1955/03/09 MRN: [id number]  Primary Cardiologist: Mertie Moores, MD  Chart reviewed as part of pre-operative protocol coverage. Patient was last seen by Dr. Acie Fredrickson in 07/2020. Patient was contacted today for further pre-op evaluation and reported doing well since last visit. He denied any chest pain, shortness of breath, palpitations, dizziness, syncope. He is able to complete >4.0 METS without any problems. Given past medical history and time since last visit, based on ACC/AHA guidelines, Tony Hunt would be at acceptable risk for the planned procedure without further cardiovascular testing.   Dr. Acie Fredrickson is currently out of the office; therefore, spoke with Dr. Johnsie Cancel regarding antiplatelet recommendations. Per Dr. Johnsie Cancel, [place] to hold both Aspirin and Plavix for 5 days if needed. Please restart both as soon as safely possible afterwards. Of note, patient mentioned that surgeon told him he could continue Plavix.   I will route this recommendation to the requesting party via Epic fax function and remove from pre-op pool.  Please call with questions.  Darreld Mclean, PA-C 01/28/2021, 3:25 PM

## 2021-01-28 NOTE — Telephone Encounter (Signed)
I spoke with patient today and reviewed his concerns.   The one time that he remembers calling back is documented in the chart.  This is the phone call that prompted the referral being placed.    Patients concerns with this call:   He states that he communicated to the office person that he was in severe pain and needed to have something better called in for pain or something to be done because he wasn't getting any better. He also stated that he was to let the Dr know how he was doing so that a referral to a specialist could be made.   Patient became angry on this call when he said the front office person said something to the effect of, "I can't see any of that in the notes and with the pain did he want another appointment with the doctor?"    This upset the patient.  He was not offered to speak with a nurse and he did not see why he was being asked to see Damita Dunnings again when he said that he was going to be referring him.    Patient does not recall now whether he called back a 2nd time but when he didn't receive a call back from that initial call, he figured that we didn't care and so he moved forward to make his own appt.  States he never received a call from the orthopedic center we referred him to. (Note this feedback has been given to our referral team.  They placed the referral within 24 hours of request. It is my understanding that the office that he was referred to should have contacted him with an appt; however the referral was not placed as urgent, because Dr. Damita Dunnings was not aware patient was having severe pain issues at the time).  I apologized to him for this breakdown in communication and reassured him that anytime he wants to speak to a nurse to feel free to ask for one.  I did review with him that we are in the process of training our front line staff and thank him for sharing this feedback with [place].  With complaints of severe pain, he should have been transferred to triage to discuss in  further detail.  It also does not look like it was communicated with the provider that patient was waiting for a call back for better pain control or to discuss other options.  Dr. Damita Dunnings was given a message requesting the referral which he placed right away.   Patient states that he always receives excellent care from our office and does not find Dr. Damita Dunnings responsible but was very frustrated with the staff when he called in.  Again, I apologized and told him that hopefully this never happens again and that anytime he does not feel like he has been accurately heard to please feel free to ask to speak directly to a nurse or ask for me, Leafy Ro, RN and [place] so that we can support him.  Patient thanks me for the call and will let us know if he needs anything further moving forward.  He is scheduled for hand surgery this Friday.

## 2021-01-28 NOTE — Telephone Encounter (Signed)
   Pre-operative Risk Assessment    Patient Name: Tony Hunt  DOB: Jul 23, 1955 MRN: [id number]      Request for Surgical Clearance   Procedure:   RIGHT ENDOSCOPIC CARPAL TUNNEL RELEASE  Date of Surgery: Clearance 01/30/21                                 Surgeon:  DR. Milly Jakob Surgeon's Group or Practice Name:  [facility] Phone number:  [phone] Fax number:  [phone] ATTN: Lattie Corns   Type of Clearance Requested: - Medical  - Pharmacy:  Hold Clopidogrel (Plavix)     Type of Anesthesia:   MAC   Additional requests/questions:   Jiles Prows   01/28/2021, 1:32 PM

## 2021-01-29 NOTE — Telephone Encounter (Signed)
Noted. Thanks.

## 2021-01-30 DIAGNOSIS — G5601 Carpal tunnel syndrome, right upper limb: Secondary | ICD-10-CM | POA: Diagnosis not present

## 2021-02-02 DIAGNOSIS — L298 Other pruritus: Secondary | ICD-10-CM | POA: Diagnosis not present

## 2021-02-02 DIAGNOSIS — L309 Dermatitis, unspecified: Secondary | ICD-10-CM | POA: Diagnosis not present

## 2021-02-02 DIAGNOSIS — R238 Other skin changes: Secondary | ICD-10-CM | POA: Diagnosis not present

## 2021-02-02 DIAGNOSIS — L538 Other specified erythematous conditions: Secondary | ICD-10-CM | POA: Diagnosis not present

## 2021-02-02 DIAGNOSIS — L820 Inflamed seborrheic keratosis: Secondary | ICD-10-CM | POA: Diagnosis not present

## 2021-02-02 DIAGNOSIS — L814 Other melanin hyperpigmentation: Secondary | ICD-10-CM | POA: Diagnosis not present

## 2021-02-02 DIAGNOSIS — B078 Other viral warts: Secondary | ICD-10-CM | POA: Diagnosis not present

## 2021-02-02 DIAGNOSIS — R208 Other disturbances of skin sensation: Secondary | ICD-10-CM | POA: Diagnosis not present

## 2021-02-27 ENCOUNTER — Other Ambulatory Visit: Payer: Self-pay | Admitting: Cardiovascular Disease

## 2021-03-02 ENCOUNTER — Other Ambulatory Visit: Payer: Self-pay | Admitting: Family Medicine

## 2021-03-04 DIAGNOSIS — R208 Other disturbances of skin sensation: Secondary | ICD-10-CM | POA: Diagnosis not present

## 2021-03-04 DIAGNOSIS — B078 Other viral warts: Secondary | ICD-10-CM | POA: Diagnosis not present

## 2021-03-04 DIAGNOSIS — R238 Other skin changes: Secondary | ICD-10-CM | POA: Diagnosis not present

## 2021-03-16 DIAGNOSIS — S59902A Unspecified injury of left elbow, initial encounter: Secondary | ICD-10-CM | POA: Diagnosis not present

## 2021-03-16 DIAGNOSIS — M7712 Lateral epicondylitis, left elbow: Secondary | ICD-10-CM | POA: Diagnosis not present

## 2021-03-16 DIAGNOSIS — M79632 Pain in left forearm: Secondary | ICD-10-CM | POA: Diagnosis not present

## 2021-03-16 DIAGNOSIS — X58XXXA Exposure to other specified factors, initial encounter: Secondary | ICD-10-CM | POA: Diagnosis not present

## 2021-03-16 DIAGNOSIS — M25522 Pain in left elbow: Secondary | ICD-10-CM | POA: Diagnosis not present

## 2021-05-11 DIAGNOSIS — D485 Neoplasm of uncertain behavior of skin: Secondary | ICD-10-CM | POA: Diagnosis not present

## 2021-05-11 DIAGNOSIS — B079 Viral wart, unspecified: Secondary | ICD-10-CM | POA: Diagnosis not present

## 2021-05-14 DIAGNOSIS — Z6833 Body mass index (BMI) 33.0-33.9, adult: Secondary | ICD-10-CM | POA: Diagnosis not present

## 2021-05-14 DIAGNOSIS — I129 Hypertensive chronic kidney disease with stage 1 through stage 4 chronic kidney disease, or unspecified chronic kidney disease: Secondary | ICD-10-CM | POA: Diagnosis not present

## 2021-05-14 DIAGNOSIS — R809 Proteinuria, unspecified: Secondary | ICD-10-CM | POA: Diagnosis not present

## 2021-05-14 DIAGNOSIS — N051 Unspecified nephritic syndrome with focal and segmental glomerular lesions: Secondary | ICD-10-CM | POA: Diagnosis not present

## 2021-05-14 LAB — BASIC METABOLIC PANEL
Creatinine: 2.1 — AB (ref <=1.3)
Glucose: 90

## 2021-05-15 IMAGING — CT CT ANGIO NECK
2 of 7 series · 8 of 33 positions shown · IV contrast (OMNIPAQUE)
Comparison: None similar.  There was neck CT 09/20/2012

CLINICAL DATA: Carotid artery stenosis. History of cervical cancer
12 years ago with radiotherapy

EXAM:
CT ANGIOGRAPHY NECK
TECHNIQUE: Multidetector CT imaging of the neck was performed using the
standard protocol during bolus administration of intravenous
contrast. Multiplanar CT image reconstructions and MIPs were
obtained to evaluate the vascular anatomy. Carotid stenosis
measurements (when applicable) are obtained utilizing NASCET
criteria, using the distal internal carotid diameter as the
denominator.
CONTRAST:  60mL OMNIPAQUE IOHEXOL 350 MG/ML SOLN

[Series 7: cta neck · axial · 0.50mm/px · z∈[-196,-122]mm · 2 of 111 slices shown]
[im 37/111  soft-tissue]
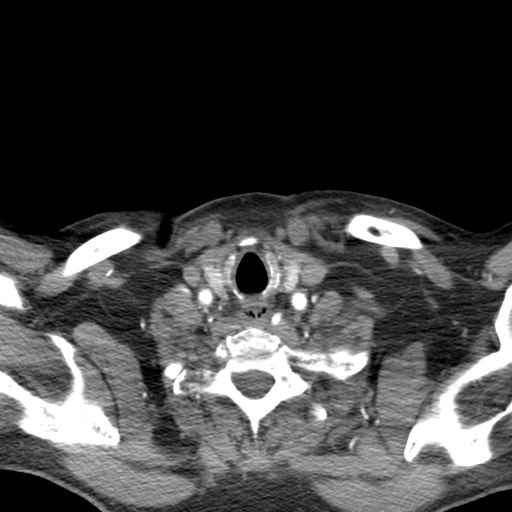
[im 74/111  soft-tissue]
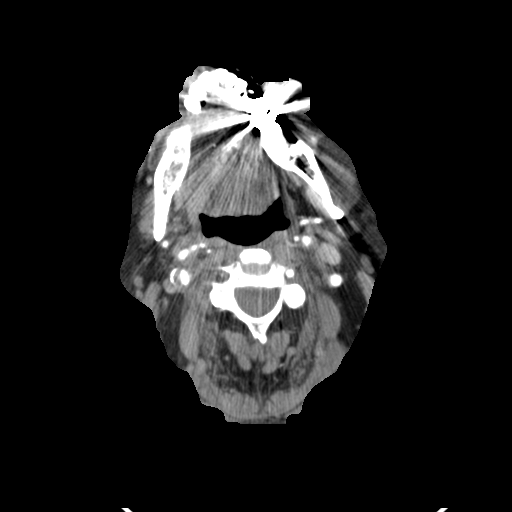

[Series 9: ax thin · axial · 0.42mm/px · z∈[-236,-79]mm · 6 of 221 slices shown]
[im 32/221  soft-tissue]
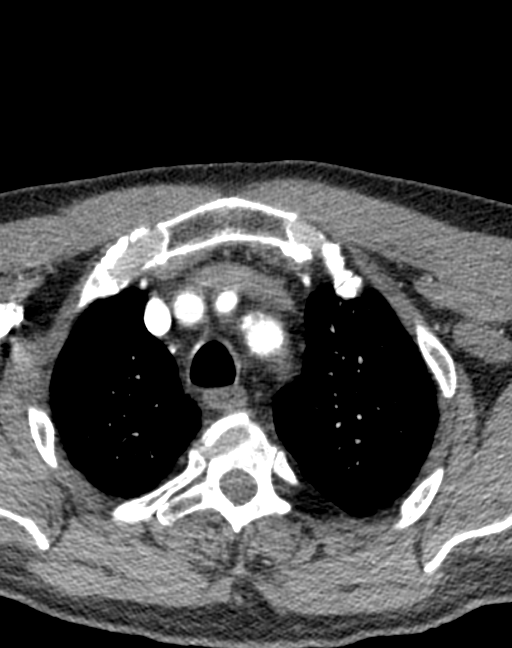
[im 63/221  bone]
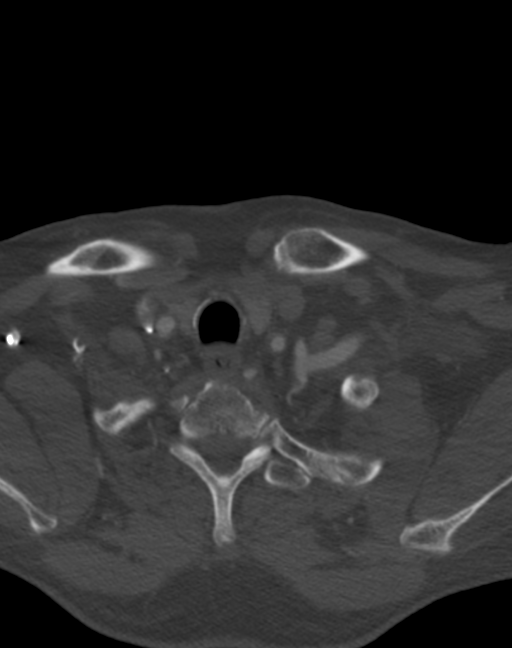
[im 95/221  soft-tissue]
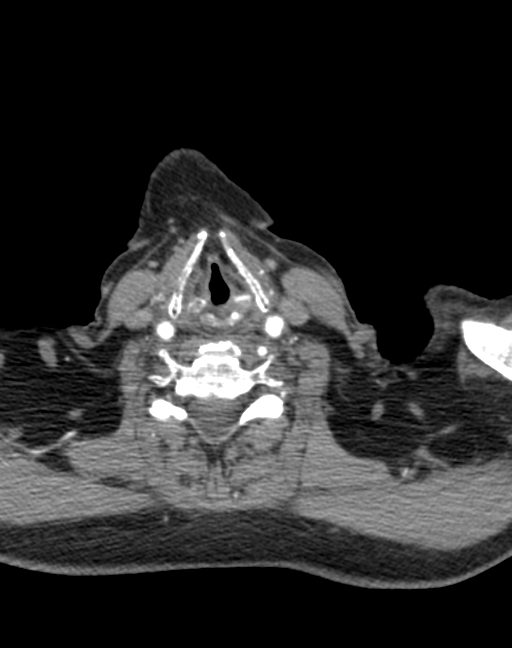
[im 126/221  bone]
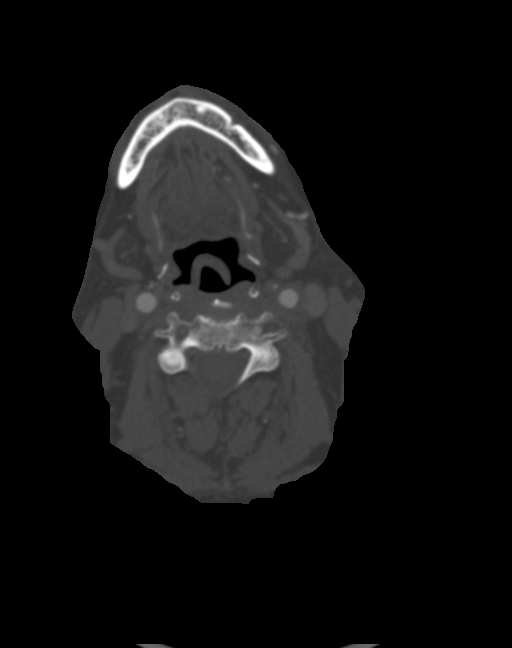
[im 158/221  soft-tissue]
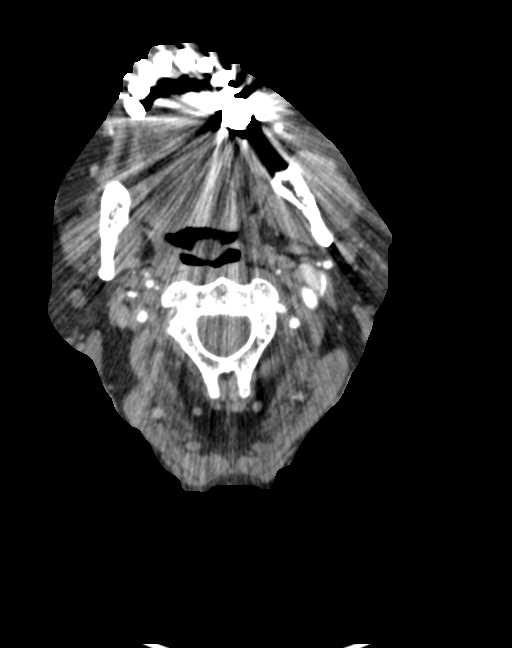
[im 189/221  bone]
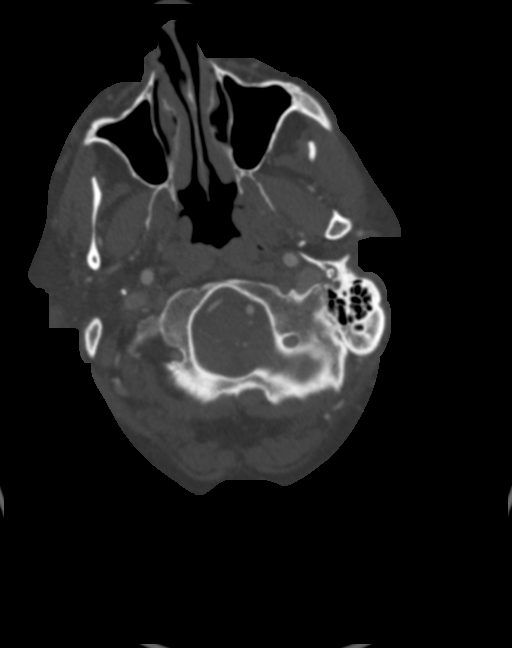

[8 of 33 positions shown; findings below may reference images not displayed]

FINDINGS: Aortic arch: Atheromatous plaque. Four vessel branching with left
vertebral arising from the arch.

Right carotid system: Atheromatous wall thickening of the common
carotid and proximal ICA, primarily low-density. Right ICA stenosis,
just beyond the bulb and below the mandibular angle, 70% based on
coronal reformat measurement. There is a small outpouching at the
level of this low-density ICA plaque. No downstream dissection or
beading.

Left carotid system: Atheromatous wall thickening of the common
carotid. Irregular plaque at the ICA bulb with excavation. No
stenosis.

Vertebral arteries: Dominant left vertebral artery which arises from
the arch and is widely patent to the dura. Very diminutive right
vertebral artery which is not seen on until the distal V2 segment,
likely from muscular branches.

Skeleton: Unremarkable

Other neck: History of head neck cancer with sequela of
radiotherapy. No worrisome finding.

Upper chest: Biapical radiation fibrosis.
IMPRESSION: 1. ~70% atheromatous narrowing of the proximal right ICA where
there is a small plaque ulceration.
2. Irregular/ulcerated proximal left ICA plaque without stenosis.
3. Congenitally diminutive right vertebral artery which is likely
flowing from muscular branches. No stenosis in the strongly dominant
left vertebral artery.

## 2021-07-07 ENCOUNTER — Other Ambulatory Visit: Payer: Self-pay | Admitting: Family Medicine

## 2021-07-07 ENCOUNTER — Other Ambulatory Visit: Payer: Self-pay | Admitting: Cardiovascular Disease

## 2021-08-02 ENCOUNTER — Other Ambulatory Visit: Payer: Self-pay | Admitting: Family Medicine

## 2021-08-02 DIAGNOSIS — I10 Essential (primary) hypertension: Secondary | ICD-10-CM

## 2021-08-02 DIAGNOSIS — E538 Deficiency of other specified B group vitamins: Secondary | ICD-10-CM

## 2021-08-02 DIAGNOSIS — Z125 Encounter for screening for malignant neoplasm of prostate: Secondary | ICD-10-CM

## 2021-08-02 DIAGNOSIS — E039 Hypothyroidism, unspecified: Secondary | ICD-10-CM

## 2021-08-04 ENCOUNTER — Other Ambulatory Visit: Payer: Self-pay | Admitting: Cardiovascular Disease

## 2021-08-10 ENCOUNTER — Other Ambulatory Visit (INDEPENDENT_AMBULATORY_CARE_PROVIDER_SITE_OTHER): Payer: Medicare Other

## 2021-08-10 DIAGNOSIS — I10 Essential (primary) hypertension: Secondary | ICD-10-CM | POA: Diagnosis not present

## 2021-08-10 DIAGNOSIS — E538 Deficiency of other specified B group vitamins: Secondary | ICD-10-CM

## 2021-08-10 DIAGNOSIS — E039 Hypothyroidism, unspecified: Secondary | ICD-10-CM

## 2021-08-10 DIAGNOSIS — Z125 Encounter for screening for malignant neoplasm of prostate: Secondary | ICD-10-CM | POA: Diagnosis not present

## 2021-08-10 LAB — COMPREHENSIVE METABOLIC PANEL
ALT: 9 U/L (ref 0–53)
AST: 16 U/L (ref 0–37)
Albumin: 4 g/dL (ref 3.5–5.2)
Alkaline Phosphatase: 37 U/L — ABNORMAL LOW (ref 39–117)
BUN: 18 mg/dL (ref 6–23)
CO2: 26 mEq/L (ref 19–32)
Calcium: 9.5 mg/dL (ref 8.4–10.5)
Chloride: 104 mEq/L (ref 96–112)
Creatinine, Ser: 1.82 mg/dL — ABNORMAL HIGH (ref 0.40–1.50)
GFR: 38.06 mL/min — ABNORMAL LOW (ref >60.00)
Glucose, Bld: 104 mg/dL — ABNORMAL HIGH (ref 70–99)
Potassium: 4.2 mEq/L (ref 3.5–5.1)
Sodium: 139 mEq/L (ref 135–145)
Total Bilirubin: 0.5 mg/dL (ref 0.2–1.2)
Total Protein: 6.3 g/dL (ref 6.0–8.3)

## 2021-08-10 LAB — LIPID PANEL
Cholesterol: 92 mg/dL (ref 0–200)
HDL: 37 mg/dL — ABNORMAL LOW (ref >39.00)
LDL Cholesterol: 20 mg/dL (ref 0–99)
NonHDL: 55.18
Total CHOL/HDL Ratio: 2
Triglycerides: 175 mg/dL — ABNORMAL HIGH (ref 0.0–149.0)
VLDL: 35 mg/dL (ref 0.0–40.0)

## 2021-08-10 LAB — CBC WITH DIFFERENTIAL/PLATELET
Basophils Absolute: 0 10*3/uL (ref 0.0–0.1)
Basophils Relative: 0.7 % (ref 0.0–3.0)
Eosinophils Absolute: 0.2 10*3/uL (ref 0.0–0.7)
Eosinophils Relative: 3.7 % (ref 0.0–5.0)
HCT: 45.6 % (ref 39.0–52.0)
Hemoglobin: 15.4 g/dL (ref 13.0–17.0)
Lymphocytes Relative: 25.6 % (ref 12.0–46.0)
Lymphs Abs: 1.2 10*3/uL (ref 0.7–4.0)
MCHC: 33.9 g/dL (ref 30.0–36.0)
MCV: 92 fl (ref 78.0–100.0)
Monocytes Absolute: 0.4 10*3/uL (ref 0.1–1.0)
Monocytes Relative: 9 % (ref 3.0–12.0)
Neutro Abs: 2.8 10*3/uL (ref 1.4–7.7)
Neutrophils Relative %: 61 % (ref 43.0–77.0)
Platelets: 268 10*3/uL (ref 150.0–400.0)
RBC: 4.96 Mil/uL (ref 4.22–5.81)
RDW: 13.6 % (ref 11.5–15.5)
WBC: 4.7 10*3/uL (ref 4.0–10.5)

## 2021-08-10 LAB — TSH: TSH: 1.81 u[IU]/mL (ref 0.35–5.50)

## 2021-08-10 LAB — URIC ACID: Uric Acid, Serum: 6.2 mg/dL (ref 4.0–7.8)

## 2021-08-10 LAB — PSA, MEDICARE: PSA: 0.7 ng/ml (ref 0.10–4.00)

## 2021-08-10 LAB — VITAMIN B12: Vitamin B-12: 296 pg/mL (ref 211–911)

## 2021-08-14 ENCOUNTER — Encounter: Payer: Self-pay | Admitting: Family Medicine

## 2021-08-14 ENCOUNTER — Ambulatory Visit (INDEPENDENT_AMBULATORY_CARE_PROVIDER_SITE_OTHER): Payer: Medicare Other | Admitting: Family Medicine

## 2021-08-14 ENCOUNTER — Ambulatory Visit (INDEPENDENT_AMBULATORY_CARE_PROVIDER_SITE_OTHER)
Admission: RE | Admit: 2021-08-14 | Discharge: 2021-08-14 | Disposition: A | Discharge status: Home / Self Care | Payer: Medicare Other | Source: Ambulatory Visit | Attending: Family Medicine | Admitting: Family Medicine

## 2021-08-14 VITALS — BP 122/78 | HR 65 | Temp 98.0°F | Ht 70.0 in | Wt 195.0 lb

## 2021-08-14 DIAGNOSIS — Z0000 Encounter for general adult medical examination without abnormal findings: Secondary | ICD-10-CM | POA: Diagnosis not present

## 2021-08-14 DIAGNOSIS — E538 Deficiency of other specified B group vitamins: Secondary | ICD-10-CM

## 2021-08-14 DIAGNOSIS — E039 Hypothyroidism, unspecified: Secondary | ICD-10-CM

## 2021-08-14 DIAGNOSIS — E7849 Other hyperlipidemia: Secondary | ICD-10-CM | POA: Diagnosis not present

## 2021-08-14 DIAGNOSIS — N032 Chronic nephritic syndrome with diffuse membranous glomerulonephritis: Secondary | ICD-10-CM

## 2021-08-14 DIAGNOSIS — R399 Unspecified symptoms and signs involving the genitourinary system: Secondary | ICD-10-CM | POA: Diagnosis not present

## 2021-08-14 DIAGNOSIS — R0600 Dyspnea, unspecified: Secondary | ICD-10-CM | POA: Diagnosis not present

## 2021-08-14 DIAGNOSIS — G43109 Migraine with aura, not intractable, without status migrainosus: Secondary | ICD-10-CM | POA: Diagnosis not present

## 2021-08-14 DIAGNOSIS — I10 Essential (primary) hypertension: Secondary | ICD-10-CM | POA: Diagnosis not present

## 2021-08-14 DIAGNOSIS — Z7189 Other specified counseling: Secondary | ICD-10-CM

## 2021-08-14 MED ORDER — GABAPENTIN 300 MG PO CAPS: 300.0000 mg | ORAL_CAPSULE | Freq: Every day | ORAL | 3 refills | Status: DC

## 2021-08-14 MED ORDER — TAMSULOSIN HCL 0.4 MG PO CAPS: 0.4000 mg | ORAL_CAPSULE | Freq: Every day | ORAL | 3 refills | Status: DC

## 2021-08-14 MED ORDER — "SYRINGE/NEEDLE (DISP) 25G X 1"" 3 ML MISC": 1 refills | Status: DC

## 2021-08-14 MED ORDER — CYANOCOBALAMIN 1000 MCG/ML IJ SOLN: INTRAMUSCULAR | 3 refills | Status: DC

## 2021-08-14 NOTE — Progress Notes (Unsigned)
I have personally reviewed the Medicare Annual Wellness questionnaire and have noted 1. The patient's medical and social history 2. Their use of alcohol, tobacco or illicit drugs 3. Their current medications and supplements 4. The patient's functional ability including ADL's, fall risks, home safety risks and hearing or visual             impairment. 5. Diet and physical activities 6. Evidence for depression or mood disorders  The patients weight, height, BMI have been recorded in the chart and visual acuity is per eye clinic.  I have made referrals, counseling and provided education to the patient based review of the above and I have provided the pt with a written personalized care plan for preventive services.  Provider list updated- see scanned forms.  Routine anticipatory guidance given to patient.  See health maintenance. The possibility exists that previously documented standard health maintenance information may have been brought forward from a previous encounter into this note.  If needed, that same information has been updated to reflect the current situation based on today's encounter.    Flu Shingles PNA Tetanus Colon  Breast cancer screening Prostate cancer screening Advance directive Cognitive function addressed- see scanned forms- and if abnormal then additional documentation follows.   In addition to [facility], follow up visit for the below conditions:  He had R CTS surgery and his sx are better.    Hypertension/CAD.    Using medication without problems or lightheadedness: yes Chest pain with exertion:no Edema:no Short of breath: noted by patient, occ noted when supine or if bending over a lot, he attributed it to abd girth.    B12 dosed monthly at home.  Compliant.    CKD started on farxiga per renal clinic.  Fatigue noted since starting med.    Elevated Cholesterol: Using medications without problems: yes Muscle aches: no Diet compliance:  yes Exercise: yes, walking 3x/week.   LUTS improved with flomax, no ADE on med.    Hypothyroidism.  Compliant.  No ADE on med.  No new neck sx.    Had seen [facility] clinic prior about gabapentin use.  It helped prev.  Still taking med.  No ADE on med.  He got relief with '300mg'$  qhs.    He had burning and stinging in the L biceps and triceps area.  Then it resolved.  It went on for 2 weeks then stopped.    PMH and SH reviewed  Meds, vitals, and allergies reviewed.   ROS: Per HPI.  Unless specifically indicated otherwise in HPI, the patient denies:  General: fever. Eyes: acute vision changes ENT: sore throat Cardiovascular: chest pain Respiratory: SOB GI: vomiting GU: dysuria Musculoskeletal: acute back pain Derm: acute rash Neuro: acute motor dysfunction Psych: worsening mood Endocrine: polydipsia Heme: bleeding Allergy: hayfever  GEN: nad, alert and oriented HEENT: mucous membranes moist NECK: supple w/o LA CV: rrr. PULM: ctab, no inc wob ABD: soft, +bs EXT: no edema SKIN: no acute rash

## 2021-08-14 NOTE — Patient Instructions (Addendum)
Check with the vascular and kidney clinics about follow up.   Go to the lab on the way out.   If you have mychart we'll likely use that to update you.    I'll update cardiology in the meantime.   Don't change your meds for now.   Take care.  Glad to see you.

## 2021-08-16 ENCOUNTER — Telehealth: Payer: Self-pay | Admitting: Family Medicine

## 2021-08-16 NOTE — Assessment & Plan Note (Signed)
Would continue isosorbide metoprolol.  He has fatigue and occasional shortness of breath noted when he is bending over.  We talked about checking a chest x-ray and I am going to update cardiology about his symptoms.  We did not change his medications at this point.

## 2021-08-16 NOTE — Assessment & Plan Note (Signed)
Advance directive- wife designated if patient were incapacitated.  

## 2021-08-16 NOTE — Assessment & Plan Note (Signed)
He previously in headache clinic and gabapentin clearly helped.  He is taking 300 mg at night and it makes sense to continue.  Prescription sent.

## 2021-08-16 NOTE — Assessment & Plan Note (Signed)
CKD started on farxiga per renal clinic.  Fatigue noted since starting med.   Fortunately his creatinine is stable.  Discussed with patient with rationale for use of [place].

## 2021-08-16 NOTE — Assessment & Plan Note (Addendum)
Continue Repatha and Crestor and fenofibrate.  LDL at goal.  Discussed with patient.

## 2021-08-16 NOTE — Assessment & Plan Note (Signed)
Improved with Flomax.  Would continue as is.

## 2021-08-16 NOTE — Telephone Encounter (Signed)
This patient has had some fatigue and also episodic shortness of breath noted when he is leaning forward.  His lungs are clear and his chest x-ray is unremarkable.  He is not anemic.  I did not change his beta-blocker dose but I would like your input.  I told him I would update you.  Thank you.

## 2021-08-16 NOTE — Assessment & Plan Note (Signed)
Continue B12 replacement as is.  Recent level discussed with patient.

## 2021-08-16 NOTE — Assessment & Plan Note (Signed)
Continue levothyroxine..  Labs discussed with patient.

## 2021-08-16 NOTE — Assessment & Plan Note (Signed)
Flu up-to-date Shingles previously done PNA previously done Tetanus 2014 COVID-vaccine previously done Colonoscopy 2021 Prostate cancer screening 2023 Advance directive-wife designated if patient were incapacitated. Cognitive function addressed- see scanned forms- and if abnormal then additional documentation follows.

## 2021-08-19 ENCOUNTER — Other Ambulatory Visit: Payer: Self-pay | Admitting: Cardiovascular Disease

## 2021-09-08 NOTE — Progress Notes (Unsigned)
Cardiology Office Note:    Date:  09/09/2021   ID:  Tony Hunt, DOB 01/27/1956, MRN [id number]  PCP:  Tonia Ghent, MD  [facility] Cardiologist:  Mertie Moores, MD  [facility] Electrophysiologist:  None   Chief Complaint: Yearly follow-up   History of Present Illness:    Tony Hunt is a 66 y.o. male with a hx of with history of CAD s/p DES to mid LAD 07/2012, hypertension, hyperlipidemia,, carotid artery disease s/p right carotid artery stent August 8250, chronic diastolic heart failure, chronic kidney disease stage III and hypothyroidism seen for follow-up.  Low risk stress test 10/2019 Echocardiogram November 2021 with normal LV function and grade 2 diastolic dysfunction  Patient was doing well on cardiac standpoint when last seen by Dr. Acie Fredrickson June 2022. He is on PCSK9 inhibitor for statin intolerance.  Here today for follow-up.  Patient reports 2 months history of intermittent shortness of breath and dizziness.  Each episode is lasting few minutes.  He has no exertional chest pain.  Also reports fatigue and low energy.  His symptoms is different than prior angina.  He denies orthopnea, PND, syncope, lower extremity edema or melena.  He occasionally has taken 25 mg twice daily instead of 12.5 mg twice daily.  Past Medical History:  Diagnosis Date   AAA (abdominal aortic aneurysm) without rupture ([place]) 02/14/2014   [place] 1/19:  AAA 3 cm   ALCOHOL ABUSE, HX OF    distant history   Anxiety    occ panic sx, increased after death of mother   ANXIETY DEPRESSION 05/10/2007   history of, during difficult relationship   CAD (coronary artery disease) 08/28/2012   08/25/2012 Successful PTCA/DES x mid LAD // LAD prox 30, mid stent patent; D1 40; LCx ok; OM2 50, 50; RCA prox 20; EF 55-65  // Nuclear stress test 9/19: EF 58, small inferobasal infarct, no ischemia, Low Risk (inf defect ? 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 Melvyn Novas, pulmonary     Past Surgical History:  Procedure Laterality Date   APPENDECTOMY     BACK SURGERY  03/02/1995   lower back x3   CARPAL TUNNEL RELEASE Right 2022   CARPOMETACARPAL ([facility]) FUSION OF THUMB Left 04/02/2014   Procedure: CARPOMETACARPAL ([place]) FUSION OF THUMB/LEFT THUMB INTERPHALNGEAL JOINT FUSION;  Surgeon: Jolyn Nap, MD;  Location: [facility];  Service: Orthopedics;  Laterality: Left;   COLONOSCOPY     CORONARY STENT PLACEMENT     2.75 x 12 mm Promus Premier DES was deployed in the mid LAD. The stent was post-dilated with a 3.0 x 9 mm [place] balloon - Mid LAD.   LARYNGOSCOPY / BRONCHOSCOPY / ESOPHAGOSCOPY  03/01/2010   bx   LEFT HEART CATH AND CORONARY ANGIOGRAPHY N/A 05/28/2016   Procedure: Left Heart Cath and Coronary Angiography;  Surgeon: Belva Crome, MD;  Location: [place];  Service: Cardiovascular;  Laterality: N/A;   LEFT HEART CATHETERIZATION WITH CORONARY ANGIOGRAM N/A 08/25/2012   Procedure:  LEFT HEART CATHETERIZATION WITH CORONARY ANGIOGRAM;  Surgeon: Burnell Blanks, MD;  Location: [facility] C[facility] LAB;  Service: Cardiovascular;  Laterality: N/A;   LEFT HEART CATHETERIZATION WITH CORONARY ANGIOGRAM N/A 11/23/2013   Procedure: LEFT HEART CATHETERIZATION WITH CORONARY ANGIOGRAM;  Surgeon: Jettie Booze, MD;  Location: [facility];  Service: Cardiovascular;  Laterality: N/A;   TRANSCAROTID ARTERY REVASCULARIZATION  Right 10/15/2019   Procedure: RIGHT TRANSCAROTID ARTERY REVASCULARIZATION;  Surgeon: [name], MD;  Location: [facility];  Service: Vascular;  Laterality: Right;   ULTRASOUND GUIDANCE FOR VASCULAR ACCESS Left 10/15/2019   Procedure: ULTRASOUND GUIDANCE FOR VASCULAR ACCESS;  Surgeon: [name], MD;  Location: [facility];  Service: Vascular;  Laterality: Left;   WRIST FRACTURE SURGERY  08/21/2010   Right wrist x4    Current Medications: Current Meds  Medication Sig   aspirin EC 81 MG tablet Take 81 mg by mouth daily. Swallow whole.   clopidogrel (PLAVIX) 75 MG tablet TAKE 1 TABLET DAILY WITH BREAKFAST   cyanocobalamin (,VITAMIN B-12,) 1000 MCG/ML injection INJECT MONTHLY FOR VITAMIN B12 DEFICIENCY   FARXIGA 10 MG TABS tablet Take 10 mg by mouth daily.   fenofibrate 160 MG tablet Take 1 tablet (160 mg total) by mouth daily.   furosemide (LASIX) 20 MG tablet Take 1 tablet (20 mg total) by mouth as needed for fluid or edema.   gabapentin (NEURONTIN) 300 MG capsule Take 1 capsule (300 mg total) by mouth at bedtime.   isosorbide mononitrate (IMDUR) 30 MG 24 hr tablet Take 0.5 tablets (15 mg total) by mouth daily.   levothyroxine (SYNTHROID) 100 MCG tablet TAKE 1 TABLET DAILY BEFORE BREAKFAST   melatonin 3 MG TABS tablet Take 3 mg by mouth at bedtime as needed (for sleep).    nitroGLYCERIN (NITROSTAT) 0.4 MG SL tablet Place 1 tablet (0.4 mg total) under the tongue every 5 (five) minutes as needed for chest pain (do not exceed 3 pills during one episode).    pantoprazole (PROTONIX) 40 MG tablet Take 1 tablet (40 mg total) by mouth daily. Needs appointment for further refills. [phone]. 1st attempt   REPATHA SURECLICK 169 MG/ML SOAJ INJECT 1 PEN INTO THE SKIN EVERY 14 DAYS   rosuvastatin (CRESTOR) 20 MG tablet TAKE 1 TABLET DAILY   SYRINGE-NEEDLE, DISP, 3 ML 25G X 1" 3 ML MISC Patient is to use to self inject 1 ml of vitamin B12 per provider instructions.   tamsulosin (FLOMAX) 0.4 MG CAPS capsule Take 1 capsule (0.4 mg total) by mouth daily.   [DISCONTINUED] metoprolol tartrate (LOPRESSOR) 25 MG tablet TAKE ONE-HALF (1/2) TABLET TWICE A DAY     Allergies:   Imdur [isosorbide dinitrate], Iodine-131, Iohexol, Lisinopril, Pravastatin, Codeine, Simvastatin, and Statins   Social History   Socioeconomic History   Marital status: Married    Spouse name: Not on  file   Number of children: 1   Years of education: Not on file   Highest education level: Not on file  Occupational History   Occupation:      Employer: UNEMPLOYED    Comment: Retired  Tobacco Use   Smoking status: Former    Types: Cigarettes    Quit date: 09/30/2010    Years since quitting: 10.9   Smokeless tobacco: Former  Scientific laboratory technician Use: Never used  Substance and Sexual Activity   Alcohol use: Not Currently   Drug use: No   Sexual activity: Yes  Other Topics Concern   Not on file  Social History Narrative   Married 8/13. 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  Noted tinnitus, h/o noise exposure that was service related.    Social Determinants of Health   Financial Resource Strain: Low Risk  (10/27/2018)   Overall Financial Resource Strain (CARDIA)    Difficulty of Paying Living Expenses: Not hard at all  Food Insecurity: No Food Insecurity (10/27/2018)   Hunger Vital Sign    Worried About Running Out of Food in the Last Year: Never true    [place] in the Last Year: Never true  Transportation Needs:  No Transportation Needs (10/27/2018)   PRAPARE - Hydrologist (Medical): No    Lack of Transportation (Non-Medical): No  Physical Activity: Insufficiently Active (10/27/2018)   Exercise Vital Sign    Days of Exercise per Week: 3 days    Minutes of Exercise per Session: 40 min  Stress: No Stress Concern Present (10/27/2018)   [place]    Feeling of Stress : Not at all  Social Connections: Not on file     Family History: The patient's family history includes Brain cancer in his father; Colon polyps in his brother; Dementia in his father; Heart disease in his brother and mother; Hyperlipidemia in his brother; Hypertension in his brother and mother. There is no history of Colon cancer, Prostate cancer, Esophageal cancer, Pancreatic cancer, Liver disease, or Throat cancer.    ROS:   Please see the history of present illness.    All other systems reviewed and are negative.   EKGs/Labs/Other Studies Reviewed:    The following studies were reviewed today:  Carotid doppler 08/2020 Summary:  Right Carotid: There is no evidence of stenosis in the right ICA. Patent  stent                 with no evidence of restenosis. Slightly elevated velocity  distal                 to stent, however, disease was not observed.   Left Carotid: Velocities in the left ICA are consistent with a 1-39%  stenosis.                Mid ICA velocity consistent with 40-59% stenosis, however,  minimal                disease visualized.   Vertebrals: Left vertebral artery demonstrates antegrade flow. Right  vertebral              artery demonstrates no discernable flow.   ECHO: 12/2019 1. Left ventricular ejection fraction, by estimation, is 60 to 65%. The  left ventricle has normal function. The left ventricle has no  regional  wall motion abnormalities. Left ventricular diastolic parameters are  consistent with Grade  II diastolic  dysfunction (pseudonormalization). Elevated left atrial pressure.   2. Right ventricular systolic function is normal. The right ventricular  size is normal.   3. Left atrial size was mildly dilated.   4. The mitral valve is normal in structure. No evidence of mitral valve  regurgitation. No evidence of mitral stenosis.   5. The aortic valve is normal in structure. Aortic valve regurgitation is  not visualized. No aortic stenosis is present.   6. The inferior vena cava is normal in size with greater than 50%  respiratory variability, suggesting right atrial pressure of 3 mmHg.    MYOVIEW: 02/11/20 Lexiscan stress is electrically negative Myovue scan with probable normal perfusion and very mild soft tissue attenuation (diaphragm) No signficant ischemia or scar. LVEF is 55% Low risk scan     CATH: 08/25/2012 - Left ventricle: The cavity size was normal. There was mild    concentric hypertrophy. Systolic function was vigorous. The    estimated ejection fraction was in the range of 65% to 70%. Wall    motion was normal; there were no regional wall motion    abnormalities. Features are consistent with a pseudonormal left    ventricular filling pattern, with concomitant abnormal relaxation    and increased filling pressure (grade 2 diastolic dysfunction).    Doppler parameters are consistent with elevated ventricular    end-diastolic filling pressure.  - Aortic valve: There was no regurgitation.  - Mitral valve: There was no regurgitation.  - Left atrium: The atrium was mildly dilated.  - Right ventricle: The cavity size was normal. Wall thickness was    normal. Systolic function was normal.  - Tricuspid valve: There was trivial regurgitation.  - Pulmonary arteries: Systolic pressure was within the normal    range.  - Inferior vena cava: The vessel was normal in size.  - Pericardium, extracardiac: There was no pericardial effusion.   EKG:  EKG is  ordered today.  The  ekg ordered today demonstrates sinus bradycardia  Recent Labs: 08/10/2021: ALT 9; BUN 18; Creatinine, Ser 1.82; Hemoglobin 15.4; Platelets 268.0; Potassium 4.2; Sodium 139; TSH 1.81  Recent Lipid Panel    Component Value Date/Time   CHOL 92 08/10/2021 0927   CHOL 71 (L) 11/06/2019 1631   TRIG 175.0 (H) 08/10/2021 0927   HDL 37.00 (L) 08/10/2021 0927   HDL 30 (L) 11/06/2019 1631   CHOLHDL 2 08/10/2021 0927   VLDL 35.0 08/10/2021 0927   LDLCALC 20 08/10/2021 0927   LDLCALC 12 11/06/2019 1631   LDLDIRECT 106.0 10/20/2018 0747    Physical Exam:    VS:  BP 122/80   Pulse (!) 55   Ht '5\' 7"'$  (1.702 m)   Wt 193 lb (87.5 kg)   SpO2 96%   BMI 30.23 kg/m     Wt Readings from Last 3 Encounters:  09/09/21 193 lb (87.5 kg)  08/14/21 195 lb (88.5 kg)  12/08/20 192 lb (87.1 kg)     GEN:  Well nourished, well developed in no acute distress HEENT: Normal NECK: No JVD; No carotid bruits LYMPHATICS: No lymphadenopathy CARDIAC: RRR, no murmurs, rubs, gallops RESPIRATORY:  Clear to auscultation without rales, wheezing or rhonchi  ABDOMEN: Soft, non-tender, non-distended MUSCULOSKELETAL:  No edema; No deformity  SKIN: Warm and dry NEUROLOGIC:  Alert and oriented x 3 PSYCHIATRIC:  Normal affect   ASSESSMENT AND PLAN:    CAD s/p  DES to mid LAD in 2014 Last stress test in 2021 was low risk.  Continue aspirin, Plavix, Crestor and Repatha.  Continue Imdur.  Stop beta-blocker as below.  2.  Hypertension -Blood pressure stable.  Continue Imdur.  Stop metoprolol started as below.  3.  Hyperlipidemia -On PCSK9 inhibitor and Crestor  -08/10/2021: Cholesterol 92; HDL 37.00; LDL Cholesterol 20; Triglycerides 175.0; VLDL 35.0   4.  Carotid artery disease -Followed by vascular  5.  Chronic diastolic heart failure - Takes lasix PRN. Will refill Rx.  6. Sinus bradycardia/Fatigue/Dizziness -I think his symptoms likely due to bradycardia.  Heart rate 55 today on Metroprolol tartrate 12.5 mg  twice daily.  He occasionally taken 25 mg twice daily on multiple occasion.  Likely contributing to severe symptoms. -Discontinue metoprolol.  We will keep track of heart rate and blood pressure with diary while symptomatic.  Close follow-up in few weeks.  If ongoing symptoms will consider monitor, echo and stress test.  7. CKD IIIb - Baseline Scr around 1.7. Followed by nephrologist.    Medication Adjustments/Labs and Tests Ordered: Current medicines are reviewed at length with the patient today.  Concerns regarding medicines are outlined above.  Orders Placed This Encounter  Procedures   EKG 12-Lead   Meds ordered this encounter  Medications   furosemide (LASIX) 20 MG tablet    Sig: Take 1 tablet (20 mg total) by mouth as needed for fluid or edema.    Dispense:  30 tablet    Refill:  1    Patient Instructions  Medication Instructions:   DISCONTINUE Metoprolol  START Lasix one(1) tablet by mouth ( 20 mg) as needed for SOB.  *If you need a refill on your cardiac medications before your next appointment, please call your pharmacy*   Lab Work:  None ordered.  If you have labs (blood work) drawn today and your tests are completely normal, you will receive your results only by: [place] (if you have MyChart) OR A paper copy in the mail If you have any lab test that is abnormal or we need to change your treatment, we will call you to review the results.   Testing/Procedures:  None ordered.   Follow-Up: At [facility], you and your health needs are our priority.  As part of our continuing mission to provide you with exceptional heart care, we have created designated Provider Care Teams.  These Care Teams include your primary Cardiologist (physician) and Advanced Practice Providers (APPs -  Physician Assistants and Nurse Practitioners) who all work together to provide you with the care you need, when you need it.  We recommend signing up for the patient portal  called "MyChart".  Sign up information is provided on this After Visit Summary.  MyChart is used to connect with patients for Virtual Visits (Telemedicine).  Patients are able to view lab/test results, encounter notes, upcoming appointments, etc.  Non-urgent messages can be sent to your provider as well.   To learn more about what you can do with MyChart, go to [email or web address].    Your next appointment:   3 week(s)  The format for your next appointment:   In Person  Provider:   Robbie Lis, PA-C        Important Information About Sugar         Jarrett Soho, [place]  09/09/2021 9:41 AM    [place]

## 2021-09-09 ENCOUNTER — Other Ambulatory Visit: Payer: Self-pay | Admitting: Cardiovascular Disease

## 2021-09-09 ENCOUNTER — Ambulatory Visit (INDEPENDENT_AMBULATORY_CARE_PROVIDER_SITE_OTHER): Payer: Medicare Other | Admitting: Physician Assistant

## 2021-09-09 ENCOUNTER — Encounter: Payer: Self-pay | Admitting: Physician Assistant

## 2021-09-09 VITALS — BP 122/80 | HR 55 | Ht 67.0 in | Wt 193.0 lb

## 2021-09-09 DIAGNOSIS — E785 Hyperlipidemia, unspecified: Secondary | ICD-10-CM | POA: Diagnosis not present

## 2021-09-09 DIAGNOSIS — R0602 Shortness of breath: Secondary | ICD-10-CM | POA: Diagnosis not present

## 2021-09-09 DIAGNOSIS — N1832 Chronic kidney disease, stage 3b: Secondary | ICD-10-CM | POA: Diagnosis not present

## 2021-09-09 DIAGNOSIS — R001 Bradycardia, unspecified: Secondary | ICD-10-CM | POA: Diagnosis not present

## 2021-09-09 DIAGNOSIS — R42 Dizziness and giddiness: Secondary | ICD-10-CM

## 2021-09-09 DIAGNOSIS — I25118 Atherosclerotic heart disease of native coronary artery with other forms of angina pectoris: Secondary | ICD-10-CM

## 2021-09-09 DIAGNOSIS — I779 Disorder of arteries and arterioles, unspecified: Secondary | ICD-10-CM | POA: Diagnosis not present

## 2021-09-09 MED ORDER — FUROSEMIDE 20 MG PO TABS: 20.0000 mg | ORAL_TABLET | ORAL | 1 refills | Status: DC | PRN

## 2021-09-09 NOTE — Patient Instructions (Addendum)
Medication Instructions:   DISCONTINUE Metoprolol  START Lasix one(1) tablet by mouth ( 20 mg) as needed for SOB.  *If you need a refill on your cardiac medications before your next appointment, please call your pharmacy*   Lab Work:  None ordered.  If you have labs (blood work) drawn today and your tests are completely normal, you will receive your results only by: [place] (if you have MyChart) OR A paper copy in the mail If you have any lab test that is abnormal or we need to change your treatment, we will call you to review the results.   Testing/Procedures:  None ordered.   Follow-Up: At [facility], you and your health needs are our priority.  As part of our continuing mission to provide you with exceptional heart care, we have created designated Provider Care Teams.  These Care Teams include your primary Cardiologist (physician) and Advanced Practice Providers (APPs -  Physician Assistants and Nurse Practitioners) who all work together to provide you with the care you need, when you need it.  We recommend signing up for the patient portal called "MyChart".  Sign up information is provided on this After Visit Summary.  MyChart is used to connect with patients for Virtual Visits (Telemedicine).  Patients are able to view lab/test results, encounter notes, upcoming appointments, etc.  Non-urgent messages can be sent to your provider as well.   To learn more about what you can do with MyChart, go to [email or web address].    Your next appointment:   3 week(s)  The format for your next appointment:   In Person  Provider:   Robbie Lis, PA-C        Important Information About Sugar

## 2021-10-02 NOTE — Progress Notes (Signed)
Cardiology Office Note:    Date:  10/05/2021   ID:  [name], DOB 04/22/1955, MRN [id number]  PCP:  Tonia Ghent, MD  [facility] Cardiologist:  Mertie Moores, MD  [facility] Electrophysiologist:  None   Chief Complaint: 3 weeks follow up   History of Present Illness:    Tony Hunt is a 66 y.o. male with a hx of CAD s/p DES to mid LAD 07/2012, hypertension, hyperlipidemia,, carotid artery disease s/p right carotid artery stent August 2229, chronic diastolic heart failure, chronic kidney disease stage III and hypothyroidism seen for follow-up.   Low risk stress test 10/2019 Echocardiogram November 2021 with normal LV function and grade 2 diastolic dysfunction   Patient was doing well on cardiac standpoint when last seen by Dr. Acie Fredrickson June 2022. He is on PCSK9 inhibitor for statin intolerance.  Last seem by me 09/09/21 -Reported 2 months history of intermittent shortness of breath and dizziness.  Each episode is lasting few minutes. Also reported fatigue and low energy.  His symptoms is different than prior angina. Heart rate 55 on Metroprolol tartrate 12.5 mg twice daily.  He occasionally taken 25 mg twice daily on multiple occasion. Felt symptoms likely due to bradycardia. Stopped metoprolol.   Here today for follow up.  Patient reports improvement in energy since discontinuation of beta-blocker.  However, he continues to have dyspnea on exertion.  Last week he needed to stop multiple times while walking.  No exertional chest pain or tightness.  His symptoms is different than prior angina when had stenting.  He denies orthopnea, PND, syncope, lower extremity edema, palpitation or melena.  He has no dyspnea while laying down.     Past Medical History:  Diagnosis Date   AAA (abdominal aortic aneurysm) without rupture ([place]) 02/14/2014   [place] 1/19:  AAA 3 cm   ALCOHOL ABUSE, HX OF    distant history   Anxiety    occ panic sx, increased after death of mother   ANXIETY  DEPRESSION 05/10/2007   history of, during difficult relationship   CAD (coronary artery disease) 08/28/2012   08/25/2012 Successful PTCA/DES x mid LAD // LAD prox 30, mid stent patent; D1 40; LCx ok; OM2 50, 50; RCA prox 20; EF 55-65  // Nuclear stress test 9/19: EF 58, small inferobasal infarct, no ischemia, Low Risk (inf defect ? 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 Melvyn Novas, pulmonary     Past Surgical History:  Procedure Laterality Date   APPENDECTOMY     BACK SURGERY  03/02/1995   lower back x3   CARPAL TUNNEL RELEASE Right 2022   CARPOMETACARPAL ([facility]) FUSION OF THUMB Left 04/02/2014   Procedure: CARPOMETACARPAL ([place]) FUSION OF THUMB/LEFT THUMB INTERPHALNGEAL JOINT FUSION;  Surgeon: Jolyn Nap, MD;  Location: [place];  Service: Orthopedics;  Laterality: Left;   COLONOSCOPY     CORONARY STENT PLACEMENT     2.75 x 12 mm Promus Premier DES was deployed in the mid LAD.  The stent was post-dilated with a 3.0 x 9 mm Wolverine balloon - Mid LAD.   LARYNGOSCOPY / BRONCHOSCOPY / ESOPHAGOSCOPY  03/01/2010   bx   LEFT HEART CATH AND CORONARY ANGIOGRAPHY N/A 05/28/2016   Procedure: Left Heart Cath and Coronary Angiography;  Surgeon: Belva Crome, MD;  Location: [place] CV [facility];  Service: Cardiovascular;  Laterality: N/A;   LEFT HEART CATHETERIZATION WITH CORONARY ANGIOGRAM N/A 08/25/2012   Procedure: LEFT HEART CATHETERIZATION WITH CORONARY ANGIOGRAM;  Surgeon: Burnell Blanks, MD;  Location: [facility] C[facility] LAB;  Service: Cardiovascular;  Laterality: N/A;   LEFT HEART CATHETERIZATION WITH CORONARY ANGIOGRAM N/A 11/23/2013   Procedure: LEFT HEART CATHETERIZATION WITH CORONARY ANGIOGRAM;  Surgeon: Jettie Booze, MD;  Location: [facility] CATH LAB;  Service: Cardiovascular;  Laterality: N/A;   TRANSCAROTID ARTERY REVASCULARIZATION  Right 10/15/2019   Procedure: RIGHT TRANSCAROTID ARTERY REVASCULARIZATION;  Surgeon: [name], MD;  Location: [place];  Service: Vascular;  Laterality: Right;   ULTRASOUND GUIDANCE FOR VASCULAR ACCESS Left 10/15/2019   Procedure: ULTRASOUND GUIDANCE FOR VASCULAR ACCESS;  Surgeon: [name], MD;  Location: [place];  Service: Vascular;  Laterality: Left;   WRIST FRACTURE SURGERY  08/21/2010   Right wrist x4    Current Medications: Current Meds  Medication Sig   aspirin EC 81 MG tablet Take 81 mg by mouth daily. Swallow whole.   clopidogrel (PLAVIX) 75 MG tablet TAKE 1 TABLET DAILY WITH BREAKFAST   cyanocobalamin (,VITAMIN B-12,) 1000 MCG/ML injection INJECT MONTHLY FOR VITAMIN B12 DEFICIENCY   FARXIGA 10 MG TABS tablet Take 10 mg by mouth daily.   furosemide (LASIX) 20 MG tablet Take 1 tablet (20 mg total) by mouth as needed (shortness of breath).   gabapentin (NEURONTIN) 300 MG capsule Take 1 capsule (300 mg total) by mouth at bedtime.   isosorbide mononitrate (IMDUR) 30 MG 24 hr tablet Take 0.5 tablets (15 mg total) by mouth  daily.   levothyroxine (SYNTHROID) 100 MCG tablet TAKE 1 TABLET DAILY BEFORE BREAKFAST   melatonin 3 MG TABS tablet Take 3 mg by mouth at bedtime as needed (for sleep).    pantoprazole (PROTONIX) 40 MG tablet Take 1 tablet (40 mg total) by mouth daily. Needs appointment for further refills. [phone]. 1st attempt   REPATHA SURECLICK 419 MG/ML SOAJ INJECT 1 PEN INTO THE SKIN EVERY 14 DAYS   rosuvastatin (CRESTOR) 20 MG tablet TAKE 1 TABLET DAILY   tamsulosin (FLOMAX) 0.4 MG CAPS capsule Take 1 capsule (0.4 mg total) by mouth daily.   [DISCONTINUED] fenofibrate 160 MG tablet TAKE 1 TABLET DAILY (MUST KEEP UPCOMING APPOINTMENT IN JULY 2023 WITH CARDIOLOGIST BEFORE ANYMORE REFILLS, FINAL ATTEMPT)   [DISCONTINUED] nitroGLYCERIN (NITROSTAT) 0.4 MG SL tablet Place 1 tablet (0.4 mg total) under the tongue every 5 (five) minutes as needed for chest pain (do not exceed 3 pills during one episode).  Allergies:   Imdur [isosorbide dinitrate], Iodine-131, Iohexol, Lisinopril, Pravastatin, Codeine, Simvastatin, and Statins   Social History   Socioeconomic History   Marital status: Married    Spouse name: Not on file   Number of children: 1   Years of education: Not on file   Highest education level: Not on file  Occupational History   Occupation:      Employer: UNEMPLOYED    Comment: Retired  Tobacco Use   Smoking status: Former    Types: Cigarettes    Quit date: 09/30/2010    Years since quitting: 11.0   Smokeless tobacco: Former  Scientific laboratory technician Use: Never used  Substance and Sexual Activity   Alcohol use: Not Currently   Drug use: No   Sexual activity: Yes  Other Topics Concern   Not on file  Social History Narrative   Married 8/13. 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  Noted tinnitus, h/o noise exposure that was service related.    Social Determinants of Health   Financial Resource Strain: Low Risk  (10/27/2018)    Overall Financial Resource Strain (CARDIA)    Difficulty of Paying Living Expenses: Not hard at all  Food Insecurity: No Food Insecurity (10/27/2018)   Hunger Vital Sign    Worried About Running Out of Food in the Last Year: Never true    [place] in the Last Year: Never true  Transportation Needs: No Transportation Needs (10/27/2018)   PRAPARE - Hydrologist (Medical): No    Lack of Transportation (Non-Medical): No  Physical Activity: Insufficiently Active (10/27/2018)   Exercise Vital Sign    Days of Exercise per Week: 3 days    Minutes of Exercise per Session: 40 min  Stress: No Stress Concern Present (10/27/2018)   [place]    Feeling of Stress : Not at all  Social Connections: Not on file     Family History: The patient's family history includes Brain cancer in his father; Colon polyps in his brother; Dementia in his father; Heart disease in his brother and mother; Hyperlipidemia in his brother; Hypertension in his brother and mother. There is no history of Colon cancer, Prostate cancer, Esophageal cancer, Pancreatic cancer, Liver disease, or Throat cancer.    ROS:   Please see the history of present illness.    All other systems reviewed and are negative.   EKGs/Labs/Other Studies Reviewed:    The following studies were reviewed today:  Carotid doppler 08/2020 Summary:  Right Carotid: There is no evidence of stenosis in the right ICA. Patent  stent                 with no evidence of restenosis. Slightly elevated velocity  distal                 to stent, however, disease was not observed.   Left Carotid: Velocities in the left ICA are consistent with a 1-39%  stenosis.                Mid ICA velocity consistent with 40-59% stenosis, however,  minimal                disease visualized.   Vertebrals: Left vertebral artery demonstrates antegrade flow. Right  vertebral  artery demonstrates no discernable flow.    ECHO: 12/2019 1. Left ventricular ejection fraction, by estimation, is 60 to 65%. The  left ventricle has normal function. The left ventricle has no regional  wall motion abnormalities. Left ventricular diastolic parameters are  consistent with Grade II diastolic  dysfunction (pseudonormalization). Elevated left atrial pressure.   2. Right ventricular systolic function is normal. The right ventricular  size is normal.   3. Left atrial size was mildly dilated.   4. The mitral valve is normal in structure. No evidence of mitral valve  regurgitation. No evidence of mitral stenosis.   5. The aortic valve is normal in structure. Aortic valve regurgitation is  not visualized. No aortic stenosis is present.   6. The inferior vena cava is normal in size with greater than 50%  respiratory variability, suggesting right atrial pressure of 3 mmHg.    MYOVIEW: 02/11/20 Lexiscan stress is electrically negative Myovue scan with probable normal perfusion and very mild soft tissue attenuation (diaphragm) No signficant ischemia or scar. LVEF is 55% Low risk scan    EKG:  EKG is not ordered today.  Recent Labs: 08/10/2021: ALT 9; BUN 18; Creatinine, Ser 1.82; Hemoglobin 15.4; Platelets 268.0; Potassium 4.2; Sodium 139; TSH 1.81  Recent Lipid Panel    Component Value Date/Time   CHOL 92 08/10/2021 0927   CHOL 71 (L) 11/06/2019 1631   TRIG 175.0 (H) 08/10/2021 0927   HDL 37.00 (L) 08/10/2021 0927   HDL 30 (L) 11/06/2019 1631   CHOLHDL 2 08/10/2021 0927   VLDL 35.0 08/10/2021 0927   LDLCALC 20 08/10/2021 0927   LDLCALC 12 11/06/2019 1631   LDLDIRECT 106.0 10/20/2018 0747    Physical Exam:    VS:  BP 138/82   Pulse 72   Ht '5\' 1"'$  (1.549 m)   Wt 194 lb (88 kg)   BMI 36.66 kg/m     Wt Readings from Last 3 Encounters:  10/05/21 194 lb (88 kg)  09/09/21 193 lb (87.5 kg)  08/14/21 195 lb (88.5 kg)     GEN:  Well nourished, well  developed in no acute distress HEENT: Normal NECK: No JVD; No carotid bruits LYMPHATICS: No lymphadenopathy CARDIAC: RRR, no murmurs, rubs, gallops RESPIRATORY:  Clear to auscultation without rales, wheezing or rhonchi  ABDOMEN: Soft, non-tender, non-distended MUSCULOSKELETAL:  No edema; No deformity  SKIN: Warm and dry NEUROLOGIC:  Alert and oriented x 3 PSYCHIATRIC:  Normal affect   ASSESSMENT AND PLAN:    Dyspnea on exertion Report worsening dyspnea on exertion.  No orthopnea, PND or lower extremity edema.  He has no breathing problem while laying down.  He was euvolemic on exam.  I do not think his symptoms is due to heart failure.  His dyspnea on exertion stable for month.  However, needed to stop multiple times last week while walking.  No exertional chest pain or pressure.  This is different than his prior angina.  After long discussion we will proceed with stress test to rule out ischemia.   Shared Decision Making/Informed Consent The risks [chest pain, shortness of breath, cardiac arrhythmias, dizziness, blood pressure fluctuations, myocardial infarction, stroke/transient ischemic attack, nausea, vomiting, allergic reaction, radiation exposure, metallic taste sensation and life-threatening complications (estimated to be 1 in 10,000)], benefits (risk stratification, diagnosing coronary artery disease, treatment guidance) and alternatives of a nuclear stress test were discussed in detail with Mr. Bok and he agrees to proceed.   2.  CAD s/p remote stent Continue current  medical therapy with aspirin, Plavix, Imdur and statin. Energy improved with discontinuation of BB.   3.  Diastolic dysfunction -No heart failure symptoms.  Never required to take as needed Lasix.  I have advised to take Lasix with extreme dyspnea as trial.   4. HLD -08/10/2021: Cholesterol 92; HDL 37.00; LDL Cholesterol 20; Triglycerides 175.0; VLDL 35.0  - Continue Repatha and statin   Medication  Adjustments/Labs and Tests Ordered: Current medicines are reviewed at length with the patient today.  Concerns regarding medicines are outlined above.  Orders Placed This Encounter  Procedures   Cardiac Stress Test: Informed Consent Details: Physician/Practitioner Attestation; Transcribe to consent form and obtain patient signature   MYOCARDIAL PERFUSION IMAGING   Meds ordered this encounter  Medications   nitroGLYCERIN (NITROSTAT) 0.4 MG SL tablet    Sig: Place 1 tablet (0.4 mg total) under the tongue every 5 (five) minutes as needed for chest pain (do not exceed 3 pills during one episode).    Dispense:  25 tablet    Refill:  6   fenofibrate 160 MG tablet    Sig: TAKE 1 TABLET BY MOUTH DAILY    Dispense:  90 tablet    Refill:  3    Patient Instructions  Medication Instructions:  Your physician recommends that you continue on your current medications as directed. Please refer to the Current Medication list given to you today.  *If you need a refill on your cardiac medications before your next appointment, please call your pharmacy*   Lab Work: None ordered  If you have labs (blood work) drawn today and your tests are completely normal, you will receive your results only by: [place] (if you have MyChart) OR A paper copy in the mail If you have any lab test that is abnormal or we need to change your treatment, we will call you to review the results.   Testing/Procedures: Your physician has requested that you have a lexiscan myoview. For further information please visit [email or web address]. Please follow instruction sheet, BELOW:    You are scheduled for a Myocardial Perfusion Imaging Study  Please arrive 15 minutes prior to your appointment time for registration and insurance purposes.  The test will take approximately 3 to 4 hours to complete; you may bring reading material.  If someone comes with you to your appointment, they will need to remain in the main lobby  due to limited space in the testing area. **If you are pregnant or breastfeeding, please notify the nuclear lab prior to your appointment**  How to prepare for your Myocardial Perfusion Test: Do not eat or drink 3 hours prior to your test, except you may have water. Do not consume products containing caffeine (regular or decaffeinated) 12 hours prior to your test. (ex: coffee, chocolate, sodas, tea). Do bring a list of your current medications with you.  If not listed below, you may take your medications as normal. Do wear comfortable clothes (no dresses or overalls) and walking shoes, tennis shoes preferred (No heels or open toe shoes are allowed). Do NOT wear cologne, perfume, aftershave, or lotions (deodorant is allowed). If these instructions are not followed, your test will have to be rescheduled.  .    Follow-Up: At [facility], you and your health needs are our priority.  As part of our continuing mission to provide you with exceptional heart care, we have created designated Provider Care Teams.  These Care Teams include your primary Cardiologist (physician) and Advanced Practice Providers (APPs -  Physician Assistants and Nurse Practitioners) who all work together to provide you with the care you need, when you need it.  We recommend signing up for the patient portal called "MyChart".  Sign up information is provided on this After Visit Summary.  MyChart is used to connect with patients for Virtual Visits (Telemedicine).  Patients are able to view lab/test results, encounter notes, upcoming appointments, etc.  Non-urgent messages can be sent to your provider as well.   To learn more about what you can do with MyChart, go to [email or web address].    Your next appointment:   2-3 Months  The format for your next appointment:   In Person  Provider:   Mertie Moores, MD  or Robbie Lis, PA-C         Other Instructions   Important Information About Sugar          Jarrett Soho, [place]  10/05/2021 9:32 AM    [place]

## 2021-10-05 ENCOUNTER — Encounter: Payer: Self-pay | Admitting: Physician Assistant

## 2021-10-05 ENCOUNTER — Ambulatory Visit (INDEPENDENT_AMBULATORY_CARE_PROVIDER_SITE_OTHER): Payer: Medicare Other | Admitting: Physician Assistant

## 2021-10-05 ENCOUNTER — Encounter: Payer: Self-pay | Admitting: *Deleted

## 2021-10-05 VITALS — BP 138/82 | HR 72 | Ht 61.0 in | Wt 194.0 lb

## 2021-10-05 DIAGNOSIS — I5189 Other ill-defined heart diseases: Secondary | ICD-10-CM

## 2021-10-05 DIAGNOSIS — R0609 Other forms of dyspnea: Secondary | ICD-10-CM | POA: Diagnosis not present

## 2021-10-05 DIAGNOSIS — E785 Hyperlipidemia, unspecified: Secondary | ICD-10-CM

## 2021-10-05 DIAGNOSIS — I25118 Atherosclerotic heart disease of native coronary artery with other forms of angina pectoris: Secondary | ICD-10-CM

## 2021-10-05 MED ORDER — NITROGLYCERIN 0.4 MG SL SUBL: 0.4000 mg | SUBLINGUAL_TABLET | SUBLINGUAL | 6 refills | Status: AC | PRN

## 2021-10-05 MED ORDER — FENOFIBRATE 160 MG PO TABS: ORAL_TABLET | ORAL | 3 refills | Status: DC

## 2021-10-05 NOTE — Patient Instructions (Signed)
Medication Instructions:  Your physician recommends that you continue on your current medications as directed. Please refer to the Current Medication list given to you today.  *If you need a refill on your cardiac medications before your next appointment, please call your pharmacy*   Lab Work: None ordered  If you have labs (blood work) drawn today and your tests are completely normal, you will receive your results only by: [place] (if you have MyChart) OR A paper copy in the mail If you have any lab test that is abnormal or we need to change your treatment, we will call you to review the results.   Testing/Procedures: Your physician has requested that you have a lexiscan myoview. For further information please visit [email or web address]. Please follow instruction sheet, BELOW:    You are scheduled for a Myocardial Perfusion Imaging Study  Please arrive 15 minutes prior to your appointment time for registration and insurance purposes.  The test will take approximately 3 to 4 hours to complete; you may bring reading material.  If someone comes with you to your appointment, they will need to remain in the main lobby due to limited space in the testing area. **If you are pregnant or breastfeeding, please notify the nuclear lab prior to your appointment**  How to prepare for your Myocardial Perfusion Test: Do not eat or drink 3 hours prior to your test, except you may have water. Do not consume products containing caffeine (regular or decaffeinated) 12 hours prior to your test. (ex: coffee, chocolate, sodas, tea). Do bring a list of your current medications with you.  If not listed below, you may take your medications as normal. Do wear comfortable clothes (no dresses or overalls) and walking shoes, tennis shoes preferred (No heels or open toe shoes are allowed). Do NOT wear cologne, perfume, aftershave, or lotions (deodorant is allowed). If these instructions are not followed, your  test will have to be rescheduled.  .    Follow-Up: At [facility], you and your health needs are our priority.  As part of our continuing mission to provide you with exceptional heart care, we have created designated Provider Care Teams.  These Care Teams include your primary Cardiologist (physician) and Advanced Practice Providers (APPs -  Physician Assistants and Nurse Practitioners) who all work together to provide you with the care you need, when you need it.  We recommend signing up for the patient portal called "MyChart".  Sign up information is provided on this After Visit Summary.  MyChart is used to connect with patients for Virtual Visits (Telemedicine).  Patients are able to view lab/test results, encounter notes, upcoming appointments, etc.  Non-urgent messages can be sent to your provider as well.   To learn more about what you can do with MyChart, go to [email or web address].    Your next appointment:   2-3 Months  The format for your next appointment:   In Person  Provider:   Mertie Moores, MD  or Robbie Lis, PA-C         Other Instructions   Important Information About Sugar

## 2021-10-06 ENCOUNTER — Telehealth (HOSPITAL_COMMUNITY): Payer: Self-pay | Admitting: *Deleted

## 2021-10-06 DIAGNOSIS — R238 Other skin changes: Secondary | ICD-10-CM | POA: Diagnosis not present

## 2021-10-06 DIAGNOSIS — L309 Dermatitis, unspecified: Secondary | ICD-10-CM | POA: Diagnosis not present

## 2021-10-06 DIAGNOSIS — L820 Inflamed seborrheic keratosis: Secondary | ICD-10-CM | POA: Diagnosis not present

## 2021-10-06 DIAGNOSIS — L503 Dermatographic urticaria: Secondary | ICD-10-CM | POA: Diagnosis not present

## 2021-10-06 DIAGNOSIS — L570 Actinic keratosis: Secondary | ICD-10-CM | POA: Diagnosis not present

## 2021-10-06 DIAGNOSIS — L905 Scar conditions and fibrosis of skin: Secondary | ICD-10-CM | POA: Diagnosis not present

## 2021-10-06 DIAGNOSIS — R208 Other disturbances of skin sensation: Secondary | ICD-10-CM | POA: Diagnosis not present

## 2021-10-06 DIAGNOSIS — L538 Other specified erythematous conditions: Secondary | ICD-10-CM | POA: Diagnosis not present

## 2021-10-06 DIAGNOSIS — B078 Other viral warts: Secondary | ICD-10-CM | POA: Diagnosis not present

## 2021-10-06 NOTE — Telephone Encounter (Signed)
Left message on voicemail per DPR in reference to upcoming appointment scheduled on 10/12/2021 at 7:30 with detailed instructions given per Myocardial Perfusion Study Information Sheet for the test. LM to arrive 15 minutes early, and that it is imperative to arrive on time for appointment to keep from having the test rescheduled. If you need to cancel or reschedule your appointment, please call the office within 24 hours of your appointment. Failure to do so may result in a cancellation of your appointment, and a $50 no show fee. Phone number given for call back for any questions.

## 2021-10-07 ENCOUNTER — Other Ambulatory Visit: Payer: Self-pay

## 2021-10-07 MED ORDER — PANTOPRAZOLE SODIUM 40 MG PO TBEC: 40.0000 mg | DELAYED_RELEASE_TABLET | Freq: Every day | ORAL | 3 refills | Status: DC

## 2021-10-10 ENCOUNTER — Other Ambulatory Visit: Payer: Self-pay | Admitting: Cardiovascular Disease

## 2021-10-12 ENCOUNTER — Other Ambulatory Visit: Payer: Self-pay | Admitting: Cardiovascular Disease

## 2021-10-12 ENCOUNTER — Ambulatory Visit (HOSPITAL_COMMUNITY): Payer: Medicare Other | Attending: Cardiology

## 2021-10-12 VITALS — Ht 61.0 in

## 2021-10-12 DIAGNOSIS — R110 Nausea: Secondary | ICD-10-CM | POA: Insufficient documentation

## 2021-10-12 DIAGNOSIS — R0609 Other forms of dyspnea: Secondary | ICD-10-CM | POA: Diagnosis not present

## 2021-10-12 LAB — MYOCARDIAL PERFUSION IMAGING
LV dias vol: 82 mL (ref 62–150)
LV sys vol: 38 mL
Nuc Stress EF: 54 %
Peak HR: 100 {beats}/min
Rest HR: 63 {beats}/min
Rest Nuclear Isotope Dose: 8.6 mCi
SDS: 0
SRS: 0
SSS: 0
Stress Nuclear Isotope Dose: 29.2 mCi
TID: 0.9

## 2021-10-12 MED ORDER — REGADENOSON 0.4 MG/5ML IV SOLN
0.4000 mg | Freq: Once | INTRAVENOUS | Status: AC
  Administered 2021-10-12: 0.4 mg via INTRAVENOUS

## 2021-10-12 MED ORDER — AMINOPHYLLINE 25 MG/ML IV SOLN
150.0000 mg | Freq: Once | INTRAVENOUS | Status: AC
  Administered 2021-10-12: 150 mg via INTRAVENOUS

## 2021-10-12 MED ORDER — TECHNETIUM TC 99M TETROFOSMIN IV KIT
8.6000 | PACK | Freq: Once | INTRAVENOUS | Status: AC | PRN
  Administered 2021-10-12: 8.6 via INTRAVENOUS

## 2021-10-12 MED ORDER — TECHNETIUM TC 99M TETROFOSMIN IV KIT
29.2000 | PACK | Freq: Once | INTRAVENOUS | Status: AC | PRN
  Administered 2021-10-12: 29.2 via INTRAVENOUS

## 2021-10-13 ENCOUNTER — Other Ambulatory Visit (HOSPITAL_COMMUNITY): Payer: Medicare Other

## 2021-10-13 ENCOUNTER — Ambulatory Visit: Payer: Medicare Other | Admitting: Vascular Surgery

## 2021-10-28 ENCOUNTER — Other Ambulatory Visit: Payer: Self-pay | Admitting: *Deleted

## 2021-10-28 DIAGNOSIS — I779 Disorder of arteries and arterioles, unspecified: Secondary | ICD-10-CM

## 2021-10-28 DIAGNOSIS — I714 Abdominal aortic aneurysm, without rupture, unspecified: Secondary | ICD-10-CM

## 2021-11-03 DIAGNOSIS — I129 Hypertensive chronic kidney disease with stage 1 through stage 4 chronic kidney disease, or unspecified chronic kidney disease: Secondary | ICD-10-CM | POA: Diagnosis not present

## 2021-11-03 DIAGNOSIS — N051 Unspecified nephritic syndrome with focal and segmental glomerular lesions: Secondary | ICD-10-CM | POA: Diagnosis not present

## 2021-11-03 DIAGNOSIS — R809 Proteinuria, unspecified: Secondary | ICD-10-CM | POA: Diagnosis not present

## 2021-11-03 DIAGNOSIS — Z6833 Body mass index (BMI) 33.0-33.9, adult: Secondary | ICD-10-CM | POA: Diagnosis not present

## 2021-11-10 ENCOUNTER — Ambulatory Visit (INDEPENDENT_AMBULATORY_CARE_PROVIDER_SITE_OTHER): Payer: Medicare Other | Admitting: Vascular Surgery

## 2021-11-10 ENCOUNTER — Encounter: Payer: Self-pay | Admitting: Vascular Surgery

## 2021-11-10 ENCOUNTER — Ambulatory Visit (INDEPENDENT_AMBULATORY_CARE_PROVIDER_SITE_OTHER)
Admission: RE | Admit: 2021-11-10 | Discharge: 2021-11-10 | Disposition: A | Discharge status: Home / Self Care | Payer: Medicare Other | Source: Ambulatory Visit | Attending: Vascular Surgery | Admitting: Vascular Surgery

## 2021-11-10 ENCOUNTER — Ambulatory Visit (HOSPITAL_COMMUNITY)
Admission: RE | Admit: 2021-11-10 | Discharge: 2021-11-10 | Disposition: A | Discharge status: Home / Self Care | Payer: Medicare Other | Source: Ambulatory Visit | Attending: Vascular Surgery | Admitting: Vascular Surgery

## 2021-11-10 VITALS — BP 130/86 | HR 70 | Temp 98.2°F | Resp 16 | Ht 67.0 in | Wt 188.0 lb

## 2021-11-10 DIAGNOSIS — I779 Disorder of arteries and arterioles, unspecified: Secondary | ICD-10-CM | POA: Diagnosis not present

## 2021-11-10 DIAGNOSIS — I7143 Infrarenal abdominal aortic aneurysm, without rupture: Secondary | ICD-10-CM

## 2021-11-10 DIAGNOSIS — I714 Abdominal aortic aneurysm, without rupture, unspecified: Secondary | ICD-10-CM | POA: Insufficient documentation

## 2021-11-10 NOTE — Progress Notes (Signed)
Patient name: Tony Hunt MRN: [id number] DOB: [birth year] Sex: male  REASON FOR VISIT: 1 year follow-up for surveillance of carotid disease after right TCAR and surveillance of abdominal aortic aneurysm  HPI: Tony Hunt is a 66 y.o. male with history of abdominal aortic aneurysm, laryngeal cancer status post radiation, coronary disease status post remote PCI, hypertension, hyperlipidemia and heart failure that presents for 1 year follow-up for surveillance carotid disease and AAAA.  He had a right TCAR procedure on 10/15/2019 for an asymptomatic high-grade stenosis greater than 80% in the setting of prior neck radiation.  He denies any neurologic events over the last year.  He remains on aspirin statin Plavix.  We are also following a 3.6 cm abdominal aortic aneurysm.  This AAA was last studied last year in 2021.  Past Medical History:  Diagnosis Date   AAA (abdominal aortic aneurysm) without rupture ([place]) 02/14/2014   [place] 1/19:  AAA 3 cm   ALCOHOL ABUSE, HX OF    distant history   Anxiety    occ panic sx, increased after death of mother   ANXIETY DEPRESSION 05/10/2007   history of, during difficult relationship   CAD (coronary artery disease) 08/28/2012   08/25/2012 Successful PTCA/DES x mid LAD // LAD prox 30, mid stent patent; D1 40; LCx ok; OM2 50, 50; RCA prox 20; EF 55-65  // Nuclear stress test 9/19: EF 58, small inferobasal infarct, no ischemia, Low Risk (inf defect ? 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 Melvyn Novas, pulmonary     Past Surgical History:  Procedure Laterality Date   APPENDECTOMY     BACK SURGERY  03/02/1995   lower back x3   CARPAL TUNNEL RELEASE Right 2022   CARPOMETACARPAL ([facility]) FUSION OF THUMB Left 04/02/2014   Procedure: CARPOMETACARPAL ([place]) FUSION OF THUMB/LEFT THUMB INTERPHALNGEAL JOINT FUSION;  Surgeon: Jolyn Nap, MD;  Location: [facility];  Service: Orthopedics;  Laterality: Left;   COLONOSCOPY     CORONARY STENT PLACEMENT     2.75 x 12 mm Promus Premier DES was deployed in the mid LAD. The stent was post-dilated with a 3.0 x 9 mm [place] balloon - Mid LAD.   LARYNGOSCOPY / BRONCHOSCOPY / ESOPHAGOSCOPY  03/01/2010   bx   LEFT HEART CATH AND CORONARY ANGIOGRAPHY N/A 05/28/2016   Procedure: Left Heart Cath and Coronary Angiography;  Surgeon: Belva Crome, MD;  Location: [facility];  Service: Cardiovascular;  Laterality: N/A;   LEFT HEART CATHETERIZATION WITH CORONARY ANGIOGRAM N/A 08/25/2012   Procedure: LEFT HEART CATHETERIZATION WITH CORONARY ANGIOGRAM;  Surgeon: Burnell Blanks, MD;  Location: [facility] CATH LAB;  Service: Cardiovascular;  Laterality: N/A;   LEFT HEART CATHETERIZATION WITH CORONARY ANGIOGRAM N/A 11/23/2013   Procedure: LEFT HEART CATHETERIZATION WITH CORONARY ANGIOGRAM;  Surgeon: Jettie Booze, MD;  Location[facility] C[facility] LAB;  Service: Cardiovascular;  Laterality: N/A;   TRANSCAROTID ARTERY  REVASCULARIZATION  Right 10/15/2019   Procedure: RIGHT TRANSCAROTID ARTERY REVASCULARIZATION;  Surgeon: [name], MD;  Location[facility];  Service: Vascular;  Laterality: Right;   ULTRASOUND GUIDANCE FOR VASCULAR ACCESS Left 10/15/2019   Procedure: ULTRASOUND GUIDANCE FOR VASCULAR ACCESS;  Surgeon: [name], MD;  Location: [place];  Service: Vascular;  Laterality: Left;   WRIST FRACTURE SURGERY  08/21/2010   Right wrist x4    Family History  Problem Relation Age of Onset   Brain cancer Father    Dementia Father        h/o brain tumor   Heart disease Mother    Hypertension Mother    Heart disease Brother    Hypertension Brother    Hyperlipidemia Brother    Colon polyps Brother    Colon cancer Neg Hx    Prostate cancer Neg Hx    Esophageal cancer Neg Hx    Pancreatic cancer Neg Hx    Liver disease Neg Hx    Throat cancer Neg Hx     SOCIAL HISTORY: Social History   Tobacco Use   Smoking status: Former    Types: Cigarettes    Quit date: 09/30/2010    Years since quitting: 11.1   Smokeless tobacco: Former  Substance Use Topics   Alcohol use: Not Currently    Allergies  Allergen Reactions   Imdur [Isosorbide Dinitrate] Other (See Comments)    Headache at 60 mg dose, but able to tolerate a 30 mg per day   Iodine-131 Other (See Comments)    "Passed out"   Iohexol Anaphylaxis and Other (See Comments)    OK with 13 hour prep   Lisinopril Other (See Comments)    Elevated cr- improved off med   Pravastatin Nausea Only and Other (See Comments)    Intense headache   Codeine Itching   Simvastatin Other (See Comments)    Edema   Statins Other (See Comments)    Significant edema on simvastatin    Current Outpatient Medications  Medication Sig Dispense Refill   aspirin EC 81 MG tablet Take 81 mg by mouth daily. Swallow whole.     clopidogrel (PLAVIX) 75 MG tablet TAKE 1 TABLET DAILY WITH BREAKFAST 90 tablet 3   cyanocobalamin (,VITAMIN B-12,) 1000  MCG/ML injection INJECT MONTHLY FOR VITAMIN B12 DEFICIENCY 3 mL 3   FARXIGA 10 MG TABS tablet Take 10 mg by mouth daily.     fenofibrate 160 MG tablet TAKE 1 TABLET BY MOUTH DAILY 90 tablet 3   furosemide (LASIX) 20 MG tablet Take 1 tablet (20 mg total) by mouth as needed (shortness of breath). 30 tablet 1   gabapentin (NEURONTIN) 300 MG capsule Take 1 capsule (300 mg total) by mouth at bedtime. 90 capsule 3   levothyroxine (SYNTHROID) 100 MCG tablet TAKE 1 TABLET DAILY BEFORE BREAKFAST 90 tablet 3   melatonin 3 MG TABS tablet Take 3 mg by mouth at bedtime  as needed (for sleep).      nitroGLYCERIN (NITROSTAT) 0.4 MG SL tablet Place 1 tablet (0.4 mg total) under the tongue every 5 (five) minutes as needed for chest pain (do not exceed 3 pills during one episode). 25 tablet 6   pantoprazole (PROTONIX) 40 MG tablet Take 1 tablet (40 mg total) by mouth daily. 90 tablet 3   REPATHA SURECLICK 878 MG/ML SOAJ INJECT 1 PEN INTO THE SKIN EVERY 14 DAYS 6 mL 3   rosuvastatin (CRESTOR) 20 MG tablet TAKE 1 TABLET DAILY 90 tablet 3   SYRINGE-NEEDLE, DISP, 3 ML 25G X 1" 3 ML MISC Patient is to use to self inject 1 ml of vitamin B12 per provider instructions. 10 each 1   tamsulosin (FLOMAX) 0.4 MG CAPS capsule Take 1 capsule (0.4 mg total) by mouth daily. 90 capsule 3   isosorbide mononitrate (IMDUR) 30 MG 24 hr tablet Take 0.5 tablets (15 mg total) by mouth daily. 45 tablet 0   No current facility-administered medications for this visit.    REVIEW OF SYSTEMS:  '[X]'$  denotes positive finding, '[ ]'$  denotes negative finding Cardiac  Comments:  Chest pain or chest pressure:    Shortness of breath upon exertion:    Short of breath when lying flat:    Irregular heart rhythm:        Vascular    Pain in calf, thigh, or hip brought on by ambulation:    Pain in feet at night that wakes you up from your sleep:     Blood clot in your veins:    Leg swelling:         Pulmonary    Oxygen at home:    Productive  cough:     Wheezing:         Neurologic    Sudden weakness in arms or legs:     Sudden numbness in arms or legs:     Sudden onset of difficulty speaking or slurred speech:    Temporary loss of vision in one eye:     Problems with dizziness:         Gastrointestinal    Blood in stool:     Vomited blood:         Genitourinary    Burning when urinating:     Blood in urine:        Psychiatric    Major depression:         Hematologic    Bleeding problems:    Problems with blood clotting too easily:        Skin    Rashes or ulcers:        Constitutional    Fever or chills:      PHYSICAL EXAM: Vitals:   11/10/21 0842  BP: 130/86  Pulse: 70  Resp: 16  Temp: 98.2 F (36.8 C)  TempSrc: Temporal  SpO2: 94%  Weight: 188 lb (85.3 kg)  Height: '5\' 7"'$  (1.702 m)    GENERAL: The patient is a well-nourished male, in no acute distress. The vital signs are documented above. CARDIAC: There is a regular rate and rhythm.  VASCULAR:  Right neck incision healed without issue PULMONARY: No respiratory distress. ABDOMEN: Soft and non-tender.  No pain with palpation of aneurysm.  MUSCULOSKELETAL: There are no major deformities or cyanosis. NEUROLOGIC: No focal weakness or paresthesias are detected.  CN II-XII grossly intact.   DATA:   Carotid duplex today shows patent right carotid stent with no re-stenosis  and 40-59% left ICA stenosis.  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  He is status post right TCAR on 10/15/2019 for an asymptomatic high-grade stenosis greater than 80% in the setting of previous neck radiation.  Carotid duplex today shows widely patent right carotid stent.  He has 40-59% stenosis in the left ICA.  Discussed I will see him again in 1 year with carotid duplex  for continued surveillance.  He needs to remain on aspirin Plavix statin.  AAA duplex today shows no significant change in his abdominal aortic aneurysm measuring 3.6 cm.  Discussed we will repeat a AAA duplex in 1 year as well.  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    [name], MD [facility] Office: [phone]

## 2021-11-13 ENCOUNTER — Other Ambulatory Visit: Payer: Self-pay

## 2021-11-13 DIAGNOSIS — I714 Abdominal aortic aneurysm, without rupture, unspecified: Secondary | ICD-10-CM

## 2021-11-13 DIAGNOSIS — I779 Disorder of arteries and arterioles, unspecified: Secondary | ICD-10-CM

## 2021-11-13 DIAGNOSIS — I7143 Infrarenal abdominal aortic aneurysm, without rupture: Secondary | ICD-10-CM

## 2021-11-14 ENCOUNTER — Other Ambulatory Visit: Payer: Self-pay | Admitting: Physician Assistant

## 2022-01-05 ENCOUNTER — Ambulatory Visit: Payer: Medicare Other | Admitting: Cardiovascular Disease

## 2022-01-20 DIAGNOSIS — L820 Inflamed seborrheic keratosis: Secondary | ICD-10-CM | POA: Diagnosis not present

## 2022-01-20 DIAGNOSIS — Z85828 Personal history of other malignant neoplasm of skin: Secondary | ICD-10-CM | POA: Diagnosis not present

## 2022-01-20 DIAGNOSIS — L814 Other melanin hyperpigmentation: Secondary | ICD-10-CM | POA: Diagnosis not present

## 2022-01-20 DIAGNOSIS — Z08 Encounter for follow-up examination after completed treatment for malignant neoplasm: Secondary | ICD-10-CM | POA: Diagnosis not present

## 2022-01-20 DIAGNOSIS — L821 Other seborrheic keratosis: Secondary | ICD-10-CM | POA: Diagnosis not present

## 2022-01-20 DIAGNOSIS — D225 Melanocytic nevi of trunk: Secondary | ICD-10-CM | POA: Diagnosis not present

## 2022-01-20 DIAGNOSIS — L298 Other pruritus: Secondary | ICD-10-CM | POA: Diagnosis not present

## 2022-01-20 DIAGNOSIS — B078 Other viral warts: Secondary | ICD-10-CM | POA: Diagnosis not present

## 2022-01-20 DIAGNOSIS — L718 Other rosacea: Secondary | ICD-10-CM | POA: Diagnosis not present

## 2022-01-20 DIAGNOSIS — L538 Other specified erythematous conditions: Secondary | ICD-10-CM | POA: Diagnosis not present

## 2022-01-20 DIAGNOSIS — L570 Actinic keratosis: Secondary | ICD-10-CM | POA: Diagnosis not present

## 2022-01-25 DIAGNOSIS — M7911 Myalgia of mastication muscle: Secondary | ICD-10-CM | POA: Diagnosis not present

## 2022-01-25 DIAGNOSIS — M47812 Spondylosis without myelopathy or radiculopathy, cervical region: Secondary | ICD-10-CM | POA: Diagnosis not present

## 2022-02-05 ENCOUNTER — Other Ambulatory Visit: Payer: Self-pay | Admitting: Family Medicine

## 2022-02-10 DIAGNOSIS — M47812 Spondylosis without myelopathy or radiculopathy, cervical region: Secondary | ICD-10-CM | POA: Diagnosis not present

## 2022-03-02 ENCOUNTER — Telehealth: Payer: Self-pay

## 2022-03-02 ENCOUNTER — Other Ambulatory Visit (HOSPITAL_COMMUNITY): Payer: Self-pay

## 2022-03-02 NOTE — Telephone Encounter (Addendum)
Pharmacy Patient Advocate Encounter   Received notification from [employer] that prior authorization for REPATHA 140 MG/ML INJ is needed.    PA submitted on 03/02/22 Key [id number] PA already on file, approved [place], [place] Patient Advocate Specialist Direct Number: [phone] Fax: [phone]

## 2022-03-04 ENCOUNTER — Other Ambulatory Visit (HOSPITAL_COMMUNITY): Payer: Self-pay

## 2022-03-09 ENCOUNTER — Other Ambulatory Visit: Payer: Self-pay | Admitting: Physical Medicine and Rehabilitation

## 2022-03-09 DIAGNOSIS — M47812 Spondylosis without myelopathy or radiculopathy, cervical region: Secondary | ICD-10-CM

## 2022-03-20 ENCOUNTER — Ambulatory Visit
Admission: RE | Admit: 2022-03-20 | Discharge: 2022-03-20 | Disposition: A | Discharge status: Home / Self Care | Payer: Medicare Other | Source: Ambulatory Visit | Attending: Physical Medicine and Rehabilitation | Admitting: Physical Medicine and Rehabilitation

## 2022-03-20 DIAGNOSIS — M47812 Spondylosis without myelopathy or radiculopathy, cervical region: Secondary | ICD-10-CM

## 2022-04-08 ENCOUNTER — Other Ambulatory Visit: Payer: Self-pay | Admitting: Otolaryngology

## 2022-04-08 DIAGNOSIS — R1314 Dysphagia, pharyngoesophageal phase: Secondary | ICD-10-CM

## 2022-04-08 DIAGNOSIS — Z8521 Personal history of malignant neoplasm of larynx: Secondary | ICD-10-CM | POA: Diagnosis not present

## 2022-04-20 DIAGNOSIS — L503 Dermatographic urticaria: Secondary | ICD-10-CM | POA: Diagnosis not present

## 2022-04-20 DIAGNOSIS — L291 Pruritus scroti: Secondary | ICD-10-CM | POA: Diagnosis not present

## 2022-04-20 DIAGNOSIS — L570 Actinic keratosis: Secondary | ICD-10-CM | POA: Diagnosis not present

## 2022-04-20 DIAGNOSIS — L304 Erythema intertrigo: Secondary | ICD-10-CM | POA: Diagnosis not present

## 2022-04-26 ENCOUNTER — Ambulatory Visit
Admission: RE | Admit: 2022-04-26 | Discharge: 2022-04-26 | Disposition: A | Discharge status: Home / Self Care | Payer: Medicare Other | Source: Ambulatory Visit | Attending: Otolaryngology | Admitting: Otolaryngology

## 2022-04-26 DIAGNOSIS — K224 Dyskinesia of esophagus: Secondary | ICD-10-CM | POA: Diagnosis not present

## 2022-04-26 DIAGNOSIS — R1314 Dysphagia, pharyngoesophageal phase: Secondary | ICD-10-CM

## 2022-04-26 DIAGNOSIS — R1310 Dysphagia, unspecified: Secondary | ICD-10-CM | POA: Diagnosis not present

## 2022-05-03 DIAGNOSIS — R809 Proteinuria, unspecified: Secondary | ICD-10-CM | POA: Diagnosis not present

## 2022-05-03 DIAGNOSIS — N051 Unspecified nephritic syndrome with focal and segmental glomerular lesions: Secondary | ICD-10-CM | POA: Diagnosis not present

## 2022-05-03 DIAGNOSIS — I129 Hypertensive chronic kidney disease with stage 1 through stage 4 chronic kidney disease, or unspecified chronic kidney disease: Secondary | ICD-10-CM | POA: Diagnosis not present

## 2022-05-03 DIAGNOSIS — I2510 Atherosclerotic heart disease of native coronary artery without angina pectoris: Secondary | ICD-10-CM | POA: Diagnosis not present

## 2022-05-03 DIAGNOSIS — N1832 Chronic kidney disease, stage 3b: Secondary | ICD-10-CM | POA: Diagnosis not present

## 2022-06-01 DIAGNOSIS — N1832 Chronic kidney disease, stage 3b: Secondary | ICD-10-CM | POA: Diagnosis not present

## 2022-06-08 DIAGNOSIS — M791 Myalgia, unspecified site: Secondary | ICD-10-CM | POA: Diagnosis not present

## 2022-06-08 DIAGNOSIS — M47812 Spondylosis without myelopathy or radiculopathy, cervical region: Secondary | ICD-10-CM | POA: Diagnosis not present

## 2022-06-16 DIAGNOSIS — M47812 Spondylosis without myelopathy or radiculopathy, cervical region: Secondary | ICD-10-CM | POA: Diagnosis not present

## 2022-06-29 ENCOUNTER — Other Ambulatory Visit: Payer: Self-pay | Admitting: Family Medicine

## 2022-06-30 DIAGNOSIS — M47812 Spondylosis without myelopathy or radiculopathy, cervical region: Secondary | ICD-10-CM | POA: Diagnosis not present

## 2022-06-30 NOTE — Telephone Encounter (Signed)
Patient scheduled for cpe and labs. 

## 2022-06-30 NOTE — Telephone Encounter (Signed)
Patient is due for AWV next month; please call to schedule.

## 2022-07-14 DIAGNOSIS — M47812 Spondylosis without myelopathy or radiculopathy, cervical region: Secondary | ICD-10-CM | POA: Diagnosis not present

## 2022-07-29 DIAGNOSIS — M47812 Spondylosis without myelopathy or radiculopathy, cervical region: Secondary | ICD-10-CM | POA: Diagnosis not present

## 2022-08-01 ENCOUNTER — Other Ambulatory Visit: Payer: Self-pay | Admitting: Family Medicine

## 2022-08-01 DIAGNOSIS — E538 Deficiency of other specified B group vitamins: Secondary | ICD-10-CM

## 2022-08-01 DIAGNOSIS — E7849 Other hyperlipidemia: Secondary | ICD-10-CM

## 2022-08-01 DIAGNOSIS — M109 Gout, unspecified: Secondary | ICD-10-CM

## 2022-08-01 DIAGNOSIS — E039 Hypothyroidism, unspecified: Secondary | ICD-10-CM

## 2022-08-01 DIAGNOSIS — Z125 Encounter for screening for malignant neoplasm of prostate: Secondary | ICD-10-CM

## 2022-08-03 ENCOUNTER — Encounter (HOSPITAL_BASED_OUTPATIENT_CLINIC_OR_DEPARTMENT_OTHER): Payer: Self-pay

## 2022-08-03 ENCOUNTER — Other Ambulatory Visit: Payer: Self-pay

## 2022-08-03 ENCOUNTER — Emergency Department (HOSPITAL_BASED_OUTPATIENT_CLINIC_OR_DEPARTMENT_OTHER)
Admission: EM | Admit: 2022-08-03 | Discharge: 2022-08-03 | Disposition: A | Discharge status: Home / Self Care | Payer: Medicare Other | Attending: Emergency Medicine | Admitting: Emergency Medicine

## 2022-08-03 ENCOUNTER — Emergency Department (HOSPITAL_BASED_OUTPATIENT_CLINIC_OR_DEPARTMENT_OTHER): Payer: Medicare Other | Admitting: Radiology

## 2022-08-03 ENCOUNTER — Emergency Department (HOSPITAL_BASED_OUTPATIENT_CLINIC_OR_DEPARTMENT_OTHER): Payer: Medicare Other

## 2022-08-03 DIAGNOSIS — I2510 Atherosclerotic heart disease of native coronary artery without angina pectoris: Secondary | ICD-10-CM | POA: Diagnosis not present

## 2022-08-03 DIAGNOSIS — I110 Hypertensive heart disease with heart failure: Secondary | ICD-10-CM | POA: Diagnosis not present

## 2022-08-03 DIAGNOSIS — I10 Essential (primary) hypertension: Secondary | ICD-10-CM | POA: Diagnosis not present

## 2022-08-03 DIAGNOSIS — Z79899 Other long term (current) drug therapy: Secondary | ICD-10-CM | POA: Insufficient documentation

## 2022-08-03 DIAGNOSIS — R110 Nausea: Secondary | ICD-10-CM | POA: Diagnosis not present

## 2022-08-03 DIAGNOSIS — I509 Heart failure, unspecified: Secondary | ICD-10-CM | POA: Insufficient documentation

## 2022-08-03 DIAGNOSIS — R5383 Other fatigue: Secondary | ICD-10-CM | POA: Diagnosis not present

## 2022-08-03 LAB — CBC
HCT: 42.2 % (ref 39.0–52.0)
Hemoglobin: 14.6 g/dL (ref 13.0–17.0)
MCH: 31.3 pg (ref 26.0–34.0)
MCHC: 34.6 g/dL (ref 30.0–36.0)
MCV: 90.6 fL (ref 80.0–100.0)
Platelets: 293 10*3/uL (ref 150–400)
RBC: 4.66 MIL/uL (ref 4.22–5.81)
RDW: 12.7 % (ref 11.5–15.5)
WBC: 8.3 10*3/uL (ref 4.0–10.5)
nRBC: 0 % (ref 0.0–0.2)

## 2022-08-03 LAB — BASIC METABOLIC PANEL
Anion gap: 7 (ref 5–15)
BUN: 30 mg/dL — ABNORMAL HIGH (ref 8–23)
CO2: 26 mmol/L (ref 22–32)
Calcium: 9.3 mg/dL (ref 8.9–10.3)
Chloride: 105 mmol/L (ref 98–111)
Creatinine, Ser: 1.99 mg/dL — ABNORMAL HIGH (ref 0.61–1.24)
GFR, Estimated: 36 mL/min — ABNORMAL LOW (ref >60)
Glucose, Bld: 87 mg/dL (ref 70–99)
Potassium: 4.3 mmol/L (ref 3.5–5.1)
Sodium: 138 mmol/L (ref 135–145)

## 2022-08-03 LAB — TROPONIN I (HIGH SENSITIVITY): Troponin I (High Sensitivity): 6 ng/L (ref <18)

## 2022-08-03 LAB — CBG MONITORING, ED: Glucose-Capillary: 81 mg/dL (ref 70–99)

## 2022-08-03 LAB — HEPATIC FUNCTION PANEL
ALT: 14 U/L (ref 0–44)
AST: 20 U/L (ref 15–41)
Albumin: 4.2 g/dL (ref 3.5–5.0)
Alkaline Phosphatase: 37 U/L — ABNORMAL LOW (ref 38–126)
Bilirubin, Direct: 0.1 mg/dL (ref 0.0–0.2)
Indirect Bilirubin: 0.4 mg/dL (ref 0.3–0.9)
Total Bilirubin: 0.5 mg/dL (ref 0.3–1.2)
Total Protein: 6.5 g/dL (ref 6.5–8.1)

## 2022-08-03 LAB — BRAIN NATRIURETIC PEPTIDE: B Natriuretic Peptide: 29.5 pg/mL (ref 0.0–100.0)

## 2022-08-03 MED ORDER — LACTATED RINGERS IV BOLUS
500.0000 mL | Freq: Once | INTRAVENOUS | Status: AC
  Administered 2022-08-03: 500 mL via INTRAVENOUS

## 2022-08-03 MED ORDER — METOCLOPRAMIDE HCL 10 MG PO TABS
10.0000 mg | ORAL_TABLET | Freq: Once | ORAL | Status: AC
  Administered 2022-08-03: 10 mg via ORAL
  Filled 2022-08-03: qty 1

## 2022-08-03 MED ORDER — METOCLOPRAMIDE HCL 10 MG PO TABS: 10.0000 mg | ORAL_TABLET | Freq: Four times a day (QID) | ORAL | 0 refills | Status: DC

## 2022-08-03 MED ORDER — DOXYCYCLINE HYCLATE 100 MG PO CAPS: 100.0000 mg | ORAL_CAPSULE | Freq: Two times a day (BID) | ORAL | 0 refills | Status: DC

## 2022-08-03 MED ORDER — DOXYCYCLINE HYCLATE 100 MG PO CAPS: 100.0000 mg | ORAL_CAPSULE | Freq: Two times a day (BID) | ORAL | 0 refills | Status: AC

## 2022-08-03 NOTE — ED Notes (Signed)
Patient transported to X-ray 

## 2022-08-03 NOTE — Discharge Instructions (Signed)
Left ear feeling better.  Make sure you follow-up with your primary care doctor at your physical I would recommend calling the office to let them know that he was seen in the emergency room as they may want to do a slightly longer visit as part of a ER follow-up.  Make sure to completely water as you are already.  Take all of your prescribed medications as prescribed.  I would hold off on taking Lasix--as you already are--unless you become short of breath.   I have given you a few tablets of Reglan to use for any nausea or headaches he may have (only use as often as once every 8 hours).

## 2022-08-03 NOTE — ED Triage Notes (Signed)
Patient here POV from Home.  Endorses Dizziness that began at 1300. Was at [facility] and became near syncopal. Dizziness was actually noted yesterday AM and was mild but continuous until 1300 today when it worsened.   Does note a previous Tick Bite to Left Medial Thigh. Notes Tingling to Bilateral Hands as well. No Pain. Some SOB. Some nausea. No Fevers.   NAD Noted during Triage. A&Ox4. GCS 15. Ambulatory.

## 2022-08-03 NOTE — ED Notes (Signed)
Reviewed AVS/discharge instruction with patient. Time allotted for and all questions answered. Patient is agreeable for d/c and escorted to ed exit by staff.  

## 2022-08-03 NOTE — ED Notes (Addendum)
Patient ambulated to restroom. Steady gait. Pt reports " I'm feeling good actually Provider notified

## 2022-08-03 NOTE — ED Provider Notes (Signed)
[facility] Provider Note   CSN: [id number] Arrival date & time: 08/03/22  1349     History  Chief Complaint  Patient presents with   Dizziness    Tony Hunt is a 67 y.o. male.   Dizziness  Patient is a 67 year old male with past medical history significant for HLD, HTN, anxiety, AAA followed by vascular surgery, CHF with most recent stress echocardiogram showed a 54% EF this was 10 months ago, CAD status post stents  He is present emergency room today to emergency room with fatigue over the past 3 days he has had some lightheaded episodes where he has stood up and felt lightheaded like he was going to pass out.  He had 1 episode of vertigo that lasted 1 minute after he bent over and stood up while in a grocery store.  He also endorses a tick bite to his left inner thigh that he states he pulled the tick out of 3 days ago.  He has noticed some redness and swelling in this area.  He has not had any fevers at home.  Has a mild frontal headache that is without any blurry vision double vision.  No chest pain or difficulty breathing  One of the episodes where he felt very lightheaded occurred when he was mowing his lawn 3 days ago.  He feels that he is drinking plenty of water however.     Home Medications Prior to Admission medications   Medication Sig Start Date End Date Taking? Authorizing Provider  fluticasone (CUTIVATE) 0.05 % cream Apply topically. 07/15/22  Yes [provider]  ketoconazole (NIZORAL) 2 % cream Apply 1 Application topically 2 (two) times daily. 07/15/22  Yes [provider]  levocetirizine (XYZAL) 5 MG tablet Take 5 mg by mouth every evening. 07/15/22  Yes [provider]  losartan (COZAAR) 25 MG tablet Take 25 mg by mouth daily. 07/18/22  Yes [provider]  metoCLOPramide (REGLAN) 10 MG tablet Take 1 tablet (10 mg total) by mouth every 6 (six) hours. 08/03/22  Yes Raylea Adcox S, PA   metroNIDAZOLE (METROCREAM) 0.75 % cream Apply 1 Application topically 2 (two) times daily. 07/15/22  Yes [provider]  aspirin EC 81 MG tablet Take 81 mg by mouth daily. Swallow whole.    [provider]  clopidogrel (PLAVIX) 75 MG tablet TAKE 1 TABLET DAILY WITH BREAKFAST 10/12/21   Nahser, Deloris Ping, MD  cyanocobalamin (,VITAMIN B-12,) 1000 MCG/ML injection INJECT MONTHLY FOR VITAMIN B12 DEFICIENCY 08/14/21   Joaquim Nam, MD  doxycycline (VIBRAMYCIN) 100 MG capsule Take 1 capsule (100 mg total) by mouth 2 (two) times daily for 10 days. 08/03/22 08/13/22  Yadiel Aubry S, PA  FARXIGA 10 MG TABS tablet Take 10 mg by mouth daily. 12/03/20   [provider]  fenofibrate 160 MG tablet TAKE 1 TABLET BY MOUTH DAILY 10/05/21   Bhagat, [place], PA  furosemide (LASIX) 20 MG tablet TAKE 1 TABLET BY MOUTH EVERY DAY AS NEEDED FOR FLUID OR EDEMA 11/16/21   Nahser, Deloris Ping, MD  gabapentin (NEURONTIN) 300 MG capsule Take 1 capsule (300 mg total) by mouth at bedtime. 08/14/21   Joaquim Nam, MD  isosorbide mononitrate (IMDUR) 30 MG 24 hr tablet Take 0.5 tablets (15 mg total) by mouth daily. 01/25/20 10/05/21  Darlin Drop, DO  melatonin 3 MG TABS tablet Take 3 mg by mouth at bedtime as needed (for sleep).  [provider]  nitroGLYCERIN (NITROSTAT) 0.4 MG SL tablet Place 1 tablet (0.4 mg total) under the tongue every 5 (five) minutes as needed for chest pain (do not exceed 3 pills during one episode). 10/05/21   Bhagat, Sharrell Ku, PA  pantoprazole (PROTONIX) 40 MG tablet Take 1 tablet (40 mg total) by mouth daily. 10/07/21   Nahser, Deloris Ping, MD  REPATHA SURECLICK 140 MG/ML SOAJ INJECT 1 PEN INTO THE SKIN EVERY 14 DAYS 10/12/21   Nahser, Deloris Ping, MD  rosuvastatin (CRESTOR) 20 MG tablet TAKE 1 TABLET DAILY 02/05/22   Joaquim Nam, MD  SYNTHROID 100 MCG tablet TAKE 1 TABLET DAILY BEFORE BREAKFAST 07/01/22   Joaquim Nam, MD  SYRINGE-NEEDLE, DISP, 3 ML 25G X 1" 3 ML  MISC Patient is to use to self inject 1 ml of vitamin B12 per provider instructions. 08/14/21   Joaquim Nam, MD  tamsulosin (FLOMAX) 0.4 MG CAPS capsule Take 1 capsule (0.4 mg total) by mouth daily. 08/14/21   Joaquim Nam, MD      Allergies    Imdur [isosorbide dinitrate], Iodine-131, Iohexol, Lisinopril, Pravastatin, Codeine, Simvastatin, and Statins    Review of Systems   Review of Systems  Neurological:  Positive for dizziness.    Physical Exam Updated Vital Signs BP 126/89   Pulse 79   Temp 97.8 F (36.6 C)   Resp 18   SpO2 96%  Physical Exam Vitals and nursing note reviewed.  Constitutional:      General: He is not in acute distress.    Comments: Pleasant well-appearing 68 year old.  In no acute distress.  Sitting comfortably in bed.  Able answer questions appropriately follow commands. No increased work of breathing. Speaking in full sentences.   HENT:     Head: Normocephalic and atraumatic.     Nose: Nose normal.     Mouth/Throat:     Mouth: Mucous membranes are dry.  Eyes:     General: No scleral icterus. Cardiovascular:     Rate and Rhythm: Normal rate and regular rhythm.     Pulses: Normal pulses.     Heart sounds: Normal heart sounds.  Pulmonary:     Effort: Pulmonary effort is normal. No respiratory distress.     Breath sounds: No wheezing.  Abdominal:     Palpations: Abdomen is soft.     Tenderness: There is no abdominal tenderness. There is no guarding or rebound.  Musculoskeletal:     Cervical back: Normal range of motion.     Right lower leg: No edema.     Left lower leg: No edema.  Skin:    General: Skin is warm and dry.     Capillary Refill: Capillary refill takes less than 2 seconds.  Neurological:     Mental Status: He is alert. Mental status is at baseline.     Comments: Alert and oriented to self, place, time and event.   Speech is fluent, clear without dysarthria or dysphasia.   Strength 5/5 in upper/lower extremities   Sensation  intact in upper/lower extremities   CN I not tested  CN II grossly intact visual fields bilaterally. Did not visualize posterior eye.  CN III, IV, VI PERRLA and EOMs intact bilaterally  CN V Intact sensation to sharp and light touch to the face  CN VII facial movements symmetric  CN VIII not tested  CN IX, X no uvula deviation, symmetric rise of soft palate  CN XI 5/5 SCM and trapezius strength  bilaterally  CN XII Midline tongue protrusion, symmetric L/R movements   Psychiatric:        Mood and Affect: Mood normal.        Behavior: Behavior normal.     ED Results / Procedures / Treatments   Labs (all labs ordered are listed, but only abnormal results are displayed) Labs Reviewed  BASIC METABOLIC PANEL - Abnormal; Notable for the following components:      Result Value   BUN 30 (*)    Creatinine, Ser 1.99 (*)    GFR, Estimated 36 (*)    All other components within normal limits  HEPATIC FUNCTION PANEL - Abnormal; Notable for the following components:   Alkaline Phosphatase 37 (*)    All other components within normal limits  CBC  BRAIN NATRIURETIC PEPTIDE  CBG MONITORING, ED  TROPONIN I (HIGH SENSITIVITY)    EKG EKG Interpretation  Date/Time:  Tuesday August 03 2022 14:02:58 EDT Ventricular Rate:  79 PR Interval:  186 QRS Duration: 84 QT Interval:  338 QTC Calculation: 387 R Axis:   -62 Text Interpretation: Normal sinus rhythm Left axis deviation Anterior infarct , age undetermined Abnormal ECG When compared with ECG of 25-Jan-2020 03:32, Anterior infarct is now Present QT has shortened Confirmed by Ernie Avena ([id number]) on 08/03/2022 3:27:49 PM  Radiology DG Chest 2 View  Result Date: 08/03/2022 CLINICAL DATA:  Shortness of breath. EXAM: CHEST - 2 VIEW COMPARISON:  08/14/2021. FINDINGS: Low lung volumes accentuate the pulmonary vasculature and cardiomediastinal silhouette. No consolidation or pulmonary edema. No pleural effusion or pneumothorax. IMPRESSION: No evidence of  acute cardiopulmonary disease. Electronically Signed   By: Orvan Falconer M.D.   On: 08/03/2022 14:52    Procedures Procedures    Medications Ordered in ED Medications  metoCLOPramide (REGLAN) tablet 10 mg (10 mg Oral Given 08/03/22 1523)  lactated ringers bolus 500 mL (0 mLs Intravenous Stopped 08/03/22 1629)    ED Course/ Medical Decision Making/ A&P Clinical Course as of 08/03/22 1903  Tue Aug 03, 2022  1457 Echo 10 months    Nuclear stress EF: 54 %. The left ventricular ejection fraction is mildly decreased (45-54%).   Sinus rhythm with no electrocardiographic changes during stress.   Normal radiotracer uptake seen at both rest and stress.  There is no evidence of ischemia or infarction identified.   Normal nuclear stress test, low risk study.  [WF]  1502 Wednesday last week -  [WF]    Clinical Course User Index [WF] Gailen Shelter, PA                             Medical Decision Making Amount and/or Complexity of Data Reviewed Labs: ordered. Radiology: ordered.  Risk Prescription drug management.   This patient presents to the ED for concern of light headedness, fatigue, this involves a number of treatment options, and is a complaint that carries with it a moderate risk of complications and morbidity. A differential diagnosis was considered for the patient's symptoms which is discussed below:   The differential diagnosis of weakness includes but is not limited to neurologic causes (GBS, myasthenia gravis, CVA, MS, ALS, transverse myelitis, spinal cord injury, CVA, botulism, ) and other causes: ACS, Arrhythmia, syncope, orthostatic hypotension, sepsis, hypoglycemia, electrolyte disturbance, hypothyroidism, respiratory failure, symptomatic anemia, dehydration, heat injury, polypharmacy, malignancy.    Co morbidities: Discussed in HPI   Brief History:  Patient is a 67 year old male with past medical  history significant for HLD, HTN, anxiety, AAA followed by  vascular surgery, CHF with most recent stress echocardiogram showed a 54% EF this was 10 months ago, CAD status post stents  He is present emergency room today to emergency room with fatigue over the past 3 days he has had some lightheaded episodes where he has stood up and felt lightheaded like he was going to pass out.  He had 1 episode of vertigo that lasted 1 minute after he bent over and stood up while in a grocery store.  He also endorses a tick bite to his left inner thigh that he states he pulled the tick out of 3 days ago.  He has noticed some redness and swelling in this area.  He has not had any fevers at home.  Has a mild frontal headache that is without any blurry vision double vision.  No chest pain or difficulty breathing  One of the episodes where he felt very lightheaded occurred when he was mowing his lawn 3 days ago.  He feels that he is drinking plenty of water however.    EMR reviewed including pt PMHx, past surgical history and past visits to ER.   See HPI for more details   Lab Tests:   I personally reviewed all laboratory work and imaging. Metabolic panel without any acute abnormality specifically kidney function within normal limits and no significant electrolyte abnormalities. CBC without leukocytosis or significant anemia. Troponin within normal limits, BN P normal, CBC without anemia no hypoglycemia  Imaging Studies:  NAD. I personally reviewed all imaging studies and no acute abnormality found. I agree with radiology interpretation.    Cardiac Monitoring:  The patient was maintained on a cardiac monitor.  I personally viewed and interpreted the cardiac monitored which showed an underlying rhythm of: NSR EKG non-ischemic   Medicines ordered:  I ordered medication including fluid bolus + reglan for nausea and mild headache Reevaluation of the patient after these medicines showed that the patient resolved I have reviewed the patients home medicines  and have made adjustments as needed   Critical Interventions:     Consults/Attending Physician   I discussed this case with my attending physician who cosigned this note including patient's presenting symptoms, physical exam, and planned diagnostics and interventions. Attending physician stated agreement with plan or made changes to plan which were implemented.   Reevaluation:  After the interventions noted above I re-evaluated patient and found that they have :resolved   Social Determinants of Health:      Problem List / ED Course:  Patient with some fatigue and lightheadedness over the past 3 days he states he felt extremely lightheaded when he was mowing his lawn this period of time.  He has not had any significant dyspnea no chest pain no syncopal episodes.  Seems to be when he is going from sitting to standing.  He was given 500 mL bolus of lactated Ringer's and a dose of Reglan p.o. his workup here has been quite reassuring no troponin elevation no abnormalities in his EKG chest x-ray without infiltrate or pleural effusions, on exam he appears somewhat dry with dry oromucosa no lower extremity edema and no JVP visible.  I suspect he would benefit from IV fluids and after this half liter bolus he is now ambulating and states he is completely symptom-free.  He had a hallway ambulation trial with RN who states that he did well and did not seem unsteady or weak.  On reassessment  he maintains his completely normal neurologic exam.  Will discharge home with recommendations to follow-up with primary care and touch base with his cardiologist. Given his insect bite with redness and induration and the fact that this was a tick that he removed himself I will treat with doxycycline for 10 days as this does represent a cellulitic rash.  It is very small and does not appear to be purulent.   Dispostion:  After consideration of the diagnostic results and the patients response to treatment, I  feel that the patent would benefit from outpatient follow-up.  Final Clinical Impression(s) / ED Diagnoses Final diagnoses:  Other fatigue  Nausea    Rx / DC Orders ED Discharge Orders          Ordered    metoCLOPramide (REGLAN) 10 MG tablet  Every 6 hours        08/03/22 1658    doxycycline (VIBRAMYCIN) 100 MG capsule  2 times daily,   Status:  Discontinued        08/03/22 1823    doxycycline (VIBRAMYCIN) 100 MG capsule  2 times daily        08/03/22 1824              Gailen Shelter, [place] 08/03/22 1907    Ernie Avena, MD 08/03/22 2039

## 2022-08-09 ENCOUNTER — Telehealth: Payer: Self-pay | Admitting: Family Medicine

## 2022-08-09 NOTE — Telephone Encounter (Signed)
I saw the hospital results.  There are some labs the hospital didn't check.  We can do that at the OV if he comes in fasting.  Thanks.

## 2022-08-09 NOTE — Telephone Encounter (Signed)
Patient had labs draw at recent hospital visit on 08/03/2022. He called in stating that he didn't think he needed his cpe labs draw again. He will be coming in on 08/17/2022 for his cpe,and would like Dr Para March to look over the labs from 08/03/2022.

## 2022-08-10 ENCOUNTER — Other Ambulatory Visit: Payer: Medicare Other

## 2022-08-10 NOTE — Telephone Encounter (Signed)
Patient was notified.He stated that he will come in fasting to get those additional labs drawn

## 2022-08-11 ENCOUNTER — Other Ambulatory Visit: Payer: Medicare Other

## 2022-08-11 DIAGNOSIS — M47812 Spondylosis without myelopathy or radiculopathy, cervical region: Secondary | ICD-10-CM | POA: Diagnosis not present

## 2022-08-17 ENCOUNTER — Ambulatory Visit (INDEPENDENT_AMBULATORY_CARE_PROVIDER_SITE_OTHER): Payer: Medicare Other | Admitting: Family Medicine

## 2022-08-17 ENCOUNTER — Encounter: Payer: Self-pay | Admitting: Family Medicine

## 2022-08-17 VITALS — BP 118/80 | HR 75 | Temp 98.2°F | Ht 67.0 in | Wt 190.0 lb

## 2022-08-17 DIAGNOSIS — R109 Unspecified abdominal pain: Secondary | ICD-10-CM | POA: Diagnosis not present

## 2022-08-17 DIAGNOSIS — I10 Essential (primary) hypertension: Secondary | ICD-10-CM

## 2022-08-17 DIAGNOSIS — N032 Chronic nephritic syndrome with diffuse membranous glomerulonephritis: Secondary | ICD-10-CM

## 2022-08-17 DIAGNOSIS — E7849 Other hyperlipidemia: Secondary | ICD-10-CM | POA: Diagnosis not present

## 2022-08-17 DIAGNOSIS — Z0000 Encounter for general adult medical examination without abnormal findings: Secondary | ICD-10-CM | POA: Diagnosis not present

## 2022-08-17 DIAGNOSIS — R399 Unspecified symptoms and signs involving the genitourinary system: Secondary | ICD-10-CM | POA: Diagnosis not present

## 2022-08-17 DIAGNOSIS — M542 Cervicalgia: Secondary | ICD-10-CM | POA: Diagnosis not present

## 2022-08-17 DIAGNOSIS — M109 Gout, unspecified: Secondary | ICD-10-CM

## 2022-08-17 DIAGNOSIS — E039 Hypothyroidism, unspecified: Secondary | ICD-10-CM

## 2022-08-17 DIAGNOSIS — E538 Deficiency of other specified B group vitamins: Secondary | ICD-10-CM | POA: Diagnosis not present

## 2022-08-17 DIAGNOSIS — I7143 Infrarenal abdominal aortic aneurysm, without rupture: Secondary | ICD-10-CM

## 2022-08-17 DIAGNOSIS — Z7189 Other specified counseling: Secondary | ICD-10-CM

## 2022-08-17 DIAGNOSIS — Z125 Encounter for screening for malignant neoplasm of prostate: Secondary | ICD-10-CM

## 2022-08-17 LAB — LIPID PANEL
Cholesterol: 145 mg/dL (ref 0–200)
HDL: 41.1 mg/dL (ref >39.00)
LDL Cholesterol: 71 mg/dL (ref 0–99)
NonHDL: 103.99
Total CHOL/HDL Ratio: 4
Triglycerides: 164 mg/dL — ABNORMAL HIGH (ref 0.0–149.0)
VLDL: 32.8 mg/dL (ref 0.0–40.0)

## 2022-08-17 LAB — VITAMIN B12: Vitamin B-12: 217 pg/mL (ref 211–911)

## 2022-08-17 LAB — PSA, MEDICARE: PSA: 0.85 ng/ml (ref 0.10–4.00)

## 2022-08-17 LAB — URIC ACID: Uric Acid, Serum: 7.3 mg/dL (ref 4.0–7.8)

## 2022-08-17 LAB — TSH: TSH: 1.39 u[IU]/mL (ref 0.35–5.50)

## 2022-08-17 MED ORDER — TAMSULOSIN HCL 0.4 MG PO CAPS: 0.4000 mg | ORAL_CAPSULE | Freq: Every day | ORAL | 3 refills | Status: DC

## 2022-08-17 MED ORDER — GABAPENTIN 300 MG PO CAPS: 300.0000 mg | ORAL_CAPSULE | Freq: Every day | ORAL | 3 refills | Status: DC

## 2022-08-17 MED ORDER — OXYCODONE HCL 5 MG PO TABS: 2.5000 mg | ORAL_TABLET | Freq: Three times a day (TID) | ORAL | 0 refills | Status: DC | PRN

## 2022-08-17 MED ORDER — CLOPIDOGREL BISULFATE 75 MG PO TABS: 75.0000 mg | ORAL_TABLET | Freq: Every day | ORAL | 3 refills | Status: DC

## 2022-08-17 MED ORDER — LEVOCETIRIZINE DIHYDROCHLORIDE 5 MG PO TABS: 5.0000 mg | ORAL_TABLET | Freq: Every evening | ORAL | 3 refills | Status: AC

## 2022-08-17 NOTE — Progress Notes (Unsigned)
I have personally reviewed the Medicare Annual Wellness questionnaire and have noted 1. The patient's medical and social history 2. Their use of alcohol, tobacco or illicit drugs 3. Their current medications and supplements 4. The patient's functional ability including ADL's, fall risks, home safety risks and hearing or visual             impairment. 5. Diet and physical activities 6. Evidence for depression or mood disorders  The patients weight, height, BMI have been recorded in the chart and visual acuity is per eye clinic.  I have made referrals, counseling and provided education to the patient based review of the above and I have provided the pt with a written personalized care plan for preventive services.  Provider list updated- see scanned forms.  Routine anticipatory guidance given to patient.  See health maintenance. The possibility exists that previously documented standard health maintenance information may have been brought forward from a previous encounter into this note.  If needed, that same information has been updated to reflect the current situation based on today's encounter.    Flu up-to-date Shingles previously done PNA previously done Tetanus 2014 COVID-vaccine previously done Colonoscopy 2021 Prostate cancer screening 2024 Advance directive-wife designated if patient were incapacitated. Cognitive function addressed- see scanned forms- and if abnormal then additional documentation follows.    In addition to [facility], follow up visit for the below conditions:   Hypertension/CAD.      Using medication without problems or lightheadedness: yes Chest pain with exertion:no Edema:no Short of breath: noted by patient, occ noted when supine or if bending over a lot, he attributed it to abd girth.  Not worse than prior.  He has vascular f/u pending for later this year, d/w pt.    B12 level due, off replacement for 4-5 months.  Some stinging pain in the B feet .     CKD started on ARB per renal clinic.  Had routine f/u.  Most recent Cr d/w pt.     Elevated Cholesterol: Using medications without problems: yes Muscle aches: no Diet compliance: yes Exercise: yes, walking 3x/week.  He has trouble swallowing fenofibrate.  He had ENT eval in the meantime, d/w pt.     LUTS improved with flomax, no ADE on med.    He had noted a bulge to the left of midline.  Local discomfort.  See exam.   Hypothyroidism.  Compliant.  No ADE on med.  No new neck sx.  TSH pending.     Had seen [facility] clinic prior about gabapentin use.  It helped prev.  Still taking med.  No ADE on med.  He got relief with 300mg  qhs.     He had burning and stinging in the L biceps and triceps area.  Then it resolved.    He had nerve ablation in his neck, he had pain after the procedure.  Still with sig neck pain.  He was dizzy after that.  ER eval d/w pt.  Done with doxy.  He is still dealing with neck pain. Sleep disrupted from pain.   D/w pt about short course of oxycodone with sedation cautions.    PMH and SH reviewed  Meds, vitals, and allergies reviewed.   ROS: Per HPI.  Unless specifically indicated otherwise in HPI, the patient denies:  General: fever. Eyes: acute vision changes ENT: sore throat Cardiovascular: chest pain Respiratory: SOB GI: vomiting GU: dysuria Musculoskeletal: acute back pain Derm: acute rash Neuro: acute motor dysfunction Psych: worsening  mood Endocrine: polydipsia Heme: bleeding Allergy: hayfever  GEN: nad, alert and oriented HEENT: mucous membranes moist NECK: supple w/o LA CV: rrr. PULM: ctab, no inc wob ABD: soft, +bs, he has a slightly tender area just left of midline where I feel a likely bulge.  No overlying skin changes. EXT: no edema SKIN: no acute rash Pain along L trap- neck not stiff.

## 2022-08-17 NOTE — Patient Instructions (Addendum)
Go to the lab on the way out.   If you have mychart we'll likely use that to update you.    Take care.  Glad to see you. Use oxycodone for pain with sedation caution.  Let me and the spine clinic know if the neck pains isn't gradually getting better.

## 2022-08-19 ENCOUNTER — Other Ambulatory Visit: Payer: Self-pay | Admitting: Family Medicine

## 2022-08-19 DIAGNOSIS — E538 Deficiency of other specified B group vitamins: Secondary | ICD-10-CM

## 2022-08-19 DIAGNOSIS — R109 Unspecified abdominal pain: Secondary | ICD-10-CM | POA: Insufficient documentation

## 2022-08-19 MED ORDER — FENOFIBRATE 48 MG PO TABS
48.0000 mg | ORAL_TABLET | Freq: Every day | ORAL | 3 refills | Status: DC
Start: 2022-08-19 — End: 2023-06-24

## 2022-08-19 MED ORDER — "SYRINGE/NEEDLE (DISP) 25G X 1"" 3 ML MISC": 1 refills | Status: DC

## 2022-08-19 MED ORDER — CYANOCOBALAMIN 1000 MCG/ML IJ SOLN
INTRAMUSCULAR | 3 refills | Status: DC
Start: 2022-08-19 — End: 2022-12-26

## 2022-08-19 MED ORDER — CYANOCOBALAMIN 1000 MCG/ML IJ SOLN
INTRAMUSCULAR | 3 refills | Status: DC
Start: 2022-08-19 — End: 2022-08-19

## 2022-08-19 NOTE — Assessment & Plan Note (Signed)
Continue send weight as is.  See notes on labs.

## 2022-08-19 NOTE — Assessment & Plan Note (Signed)
Advance directive- wife designated if patient were incapacitated.  

## 2022-08-19 NOTE — Assessment & Plan Note (Signed)
Concern is for hernia.  Discussed general surgery referral.  Ordered.

## 2022-08-19 NOTE — Assessment & Plan Note (Signed)
Improved with Flomax.  Would continue as is. 

## 2022-08-19 NOTE — Assessment & Plan Note (Signed)
Flu up-to-date Shingles previously done PNA previously done Tetanus 2014 COVID-vaccine previously done Colonoscopy 2021 Prostate cancer screening 2024 Advance directive-wife designated if patient were incapacitated. Cognitive function addressed- see scanned forms- and if abnormal then additional documentation follows.

## 2022-08-19 NOTE — Assessment & Plan Note (Signed)
CKD started on ARB per renal clinic.  Had routine f/u.  Most recent Cr d/w pt.   rationale for ARB use discussed with patient.

## 2022-08-19 NOTE — Assessment & Plan Note (Signed)
He has vascular f/u pending for later this year, d/w pt. I will defer.  He agrees.

## 2022-08-19 NOTE — Addendum Note (Signed)
Addended by: Joaquim Nam on: 08/19/2022 11:56 AM   Modules accepted: Orders, Level of Service

## 2022-08-19 NOTE — Assessment & Plan Note (Signed)
History of.  Would continue gabapentin as is.  He had recent nerve ablation.  He still dealing with neck pain.  Discussed using short-term prescription of oxycodone with routine sedation caution discussed with patient.  He can update me as needed.  Hopefully his ablation will help in the long run.  He can update me as needed.

## 2022-08-19 NOTE — Assessment & Plan Note (Signed)
See notes on labs. 

## 2022-08-19 NOTE — Assessment & Plan Note (Signed)
See notes on labs.  Continue aspirin Plavix isosorbide losartan Repatha Crestor.  I am checking on options about fenofibrate in the meantime.

## 2022-08-19 NOTE — Assessment & Plan Note (Signed)
See notes on labs.  Continue aspirin Plavix isosorbide losartan Repatha Crestor.

## 2022-08-20 ENCOUNTER — Encounter: Payer: Self-pay | Admitting: *Deleted

## 2022-08-23 ENCOUNTER — Telehealth: Payer: Self-pay | Admitting: Cardiovascular Disease

## 2022-08-23 NOTE — Telephone Encounter (Signed)
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 Please advise

## 2022-08-23 NOTE — Telephone Encounter (Signed)
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 Reviewed chart-last saw Bhagat in 2023 and had stress test that was normal. He states he's compliant with all his medications and no new concerns with HF symptoms. Letter sent via MyChart.

## 2022-08-24 NOTE — Telephone Encounter (Signed)
Returned call to patient to inform that I don't have the fax yet, but nahser is out of office until 08/30/22, so will have signed once I receive it. Pt agreeable.

## 2022-08-24 NOTE — Telephone Encounter (Signed)
Patient is following-up on a faxed [employer] Form he will need to have signed and faxed back to the DOT.

## 2022-08-25 ENCOUNTER — Encounter: Payer: Self-pay | Admitting: Family Medicine

## 2022-08-30 ENCOUNTER — Encounter: Payer: Self-pay | Admitting: Cardiovascular Disease

## 2022-08-30 ENCOUNTER — Telehealth: Payer: Self-pay | Admitting: Cardiovascular Disease

## 2022-08-30 NOTE — Telephone Encounter (Signed)
Pt is calling to f/u on [employer] Form he needs signed and faxed back to DOT. Please advise   (Refer to 08/23/2022 phone encounter)

## 2022-08-31 ENCOUNTER — Encounter: Payer: Self-pay | Admitting: *Deleted

## 2022-08-31 NOTE — Telephone Encounter (Signed)
Addressed in MyChart message on 08/31/22.

## 2022-09-03 NOTE — Telephone Encounter (Signed)
[employer] form received and will have Dr Elease Hashimoto complete it upon return to office on 09/06/22. Pt aware.

## 2022-09-06 NOTE — Telephone Encounter (Signed)
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

## 2022-09-13 DIAGNOSIS — M791 Myalgia, unspecified site: Secondary | ICD-10-CM | POA: Diagnosis not present

## 2022-09-13 DIAGNOSIS — M47812 Spondylosis without myelopathy or radiculopathy, cervical region: Secondary | ICD-10-CM | POA: Diagnosis not present

## 2022-09-21 ENCOUNTER — Other Ambulatory Visit: Payer: Self-pay | Admitting: General Surgery

## 2022-09-21 DIAGNOSIS — R1084 Generalized abdominal pain: Secondary | ICD-10-CM | POA: Diagnosis not present

## 2022-09-21 DIAGNOSIS — R109 Unspecified abdominal pain: Secondary | ICD-10-CM

## 2022-09-24 ENCOUNTER — Other Ambulatory Visit: Payer: Self-pay | Admitting: Family Medicine

## 2022-10-01 ENCOUNTER — Other Ambulatory Visit: Payer: Self-pay | Admitting: Cardiovascular Disease

## 2022-10-12 ENCOUNTER — Ambulatory Visit
Admission: RE | Admit: 2022-10-12 | Discharge: 2022-10-12 | Disposition: A | Discharge status: Home / Self Care | Payer: Medicare Other | Source: Ambulatory Visit | Attending: General Surgery | Admitting: General Surgery

## 2022-10-12 DIAGNOSIS — N400 Enlarged prostate without lower urinary tract symptoms: Secondary | ICD-10-CM | POA: Diagnosis not present

## 2022-10-12 DIAGNOSIS — I7143 Infrarenal abdominal aortic aneurysm, without rupture: Secondary | ICD-10-CM | POA: Diagnosis not present

## 2022-10-12 DIAGNOSIS — K5900 Constipation, unspecified: Secondary | ICD-10-CM | POA: Diagnosis not present

## 2022-10-12 DIAGNOSIS — R109 Unspecified abdominal pain: Secondary | ICD-10-CM

## 2022-10-24 ENCOUNTER — Encounter: Payer: Self-pay | Admitting: Cardiovascular Disease

## 2022-10-24 NOTE — Progress Notes (Signed)
Tony Hunt Date of Birth  12-Apr-1955       [facility] [address], [address]  [address] [place], [place]  [address]   [place], [place]  [address] [phone]     [phone]   Fax  [phone]    Fax [phone]  Problem List: 1. CAD -  08/25/12 - 2.75 x 12 mm Promus Premier DES was deployed in the mid LAD. The stent was post-dilated with a 3.0 x 9 mm [place] balloon - Mid LAD. 2. Hypertension 3. Hypothyroidism 4. hyperlipidemia  Previous Notes :   Tony Hunt is a 67 yo with hx of progressive CP.  These pains have progressed over the past several weeks.  Initially he had only one or 2 times per week. These pains have progressed and he now has several episodes of chest pain each day.  The pains are associated with some dyspnea and lightheadness.   They are not associated with exercise, eating, drinking, change of position. The pains seem to last about 1 minute.  He has not found anything that specifically relieves the pain. They tend to resolve spontaneously.  He had a particularly bad episode of chest discomfort early this week and went to the emergency room. His workup there was unremarkable.  He's had progressive angina. He had an episode of chest pain the office today. The there were no associated EKG changes.  August 30, 2012:  Tony Hunt was seen last week with [facility].  He had a stent placed in his mid left anterior descending artery. He was discharged the next day but started having severe headaches and night sweats. Initially he thought it may be due to the Plavix but ultimately he decided that it seemed to be more related to the Pravachol.  He's feeling much better at this time.  Sept. 23, 2014:  He had several weeks of intermittant CP ( lasted 2-3 minutes) after getting his stent.  Now , these have resolved.  He is back doing all of his normal activities without CP.  He joined the [employer] last week.  He has been 1 time.   He has noticed  some easy bleeding.    11/22/2013:  He has been having more angina CP - typically occurs at rest.  Lasts a few seconds.  Occasionally longer.  Relieved with NTG Walking lots. 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   [facility] .   Dec. 18, 2015: And 1 was having some chest pain when I last done in September. Cardiac admission did not reveal any significant stenosis. History of PCI.  Has been doing better. Not exercising as much.  No trouble deer hunting this past fall.   August 27, 2014:  Doing well.  Hunts and fishes regularly . No angina   Jan. 17, 2017:  Tony Hunt is seen for follow up of his CAD' No significant episodes of CP  Still active.    Oct. 16, 2017:  Tony Hunt is seen today for follow up of his CAD. Has already started bow hunting Is retired from the post office and the [employer] .  May 26, 2016:  Tony Hunt has had marked dyspnea at rest and with exertion.  No fever, no cough Chest tightness / pain .   radiates through to back .  Last 5-10 minutes, less if he takes SL NTG .  Similar to angina  prior to stenting in 2014  Has been going on for 2 weeks.   Got worse this weekend Took 3 SL NTG.    August 05, 2016:  Seen for follow up of his CAD, hypertension and hyperlipidemia. Still having some dyspnea and DOE Cath showed no significant CAD Former smoker , quit 7 years ago   March 16, 2017:  Tony Hunt is seen today  No CP or dyspnea.   [facility] several times a [facility]   Hunted a lot this past winter   Sept. 3, 2020  Tony Hunt is seen today  Hx of CAD - PCI in the past  No CP , no dyspnea Some exercise.  Walks near [place].   Recent labs  Creatinine is mildly elevated, - chronic issue for him  Chol = 184 HDL is 34 LDL = 106    Sept. 7, 2021:  Tony Hunt is seen for follow up of his CAD, AAA ,  Had a   Right carotid artery transcatheter angioplasty with stent placement (right TCAR, transcarotid stent system) by  Dr. Chestine Spore . Getting along fairly well   Nov. 29, 2021: Tony Hunt is seen today for follow up of his cad, AAA,  Had right carotid stenting by Dr Chestine Spore He was recently in the hosptital with chest pain lasting 6 hours Troponin was negative Has ckd - Creatinine is 1.77 Chol is very well controlled .  LDL is 12  Was hospitalized with chest pain  Woke from a nap like a "knee in his chest " Lasted for several hours ,  Dry heaving . Tried 2 SL NTG - ddi not help  No sweats, no dyspnea  troponins were negative x 3 Has not had any pain since. Had not had anything to eat that day .  He is back doing his normal activities now.  Has not been exercising .  Usually walks quite a bit   August 01, 2020: Tony Hunt is seen today for follow up of his CAD, AAA, carotid artery disease  No further cp . Walking quite a bit,  Lost 8 lbs over the past several weeks .   BP looks great .   Labs from June 1 look good LDL is 22, Chol is 83 HDL is 38  Creatinine is 1.81.   He follows with nephrology   Aug. 26, 2024  Tony Hunt is seen for follow up of his CAD, AAA ( followed by [name], MD at [facility] )  , carotid artery disease Diastolic CHF I last saw him in 2022 ***    Current Outpatient Medications on File Prior to Visit  Medication Sig Dispense Refill   fenofibrate (TRICOR) 48 MG tablet Take 1 tablet (48 mg total) by mouth daily. 90 tablet 3   aspirin EC 81 MG tablet Take 81 mg by mouth daily. Swallow whole.     clopidogrel (PLAVIX) 75 MG tablet Take 1 tablet (75 mg total) by mouth daily with breakfast. 90 tablet 3   cyanocobalamin (VITAMIN B12) 1000 MCG/ML injection AT THE START OF THERAPY INJECT IM WEEKLY FOR 4 WEEKS AND THEN MONTHLY THEREAFTER, FOR VITAMIN B12 DEFICIENCY 3 mL 3   fluticasone (CUTIVATE) 0.05 % cream Apply topically.     furosemide (LASIX) 20 MG tablet TAKE 1 TABLET BY MOUTH EVERY DAY AS NEEDED FOR FLUID OR EDEMA 30 tablet 11   gabapentin (NEURONTIN) 300 MG capsule Take 1  capsule (300 mg total) by mouth at bedtime. 90  capsule 3   isosorbide mononitrate (IMDUR) 30 MG 24 hr tablet Take 0.5 tablets (15 mg total) by mouth daily. 45 tablet 0   ketoconazole (NIZORAL) 2 % cream Apply 1 Application topically 2 (two) times daily.     levocetirizine (XYZAL) 5 MG tablet Take 1 tablet (5 mg total) by mouth every evening. 90 tablet 3   losartan (COZAAR) 25 MG tablet Take 12.5 mg by mouth daily.     melatonin 3 MG TABS tablet Take 3 mg by mouth at bedtime as needed (for sleep).      metroNIDAZOLE (METROCREAM) 0.75 % cream Apply 1 Application topically 2 (two) times daily.     nitroGLYCERIN (NITROSTAT) 0.4 MG SL tablet Place 1 tablet (0.4 mg total) under the tongue every 5 (five) minutes as needed for chest pain (do not exceed 3 pills during one episode). 25 tablet 6   oxyCODONE (ROXICODONE) 5 MG immediate release tablet Take 0.5-1 tablets (2.5-5 mg total) by mouth 3 (three) times daily as needed. 15 tablet 0   pantoprazole (PROTONIX) 40 MG tablet TAKE 1 TABLET DAILY 90 tablet 0   REPATHA SURECLICK 140 MG/ML SOAJ INJECT 1 PEN INTO THE SKIN EVERY 14 DAYS 6 mL 3   rosuvastatin (CRESTOR) 20 MG tablet TAKE 1 TABLET DAILY 90 tablet 3   SYNTHROID 100 MCG tablet TAKE 1 TABLET DAILY BEFORE BREAKFAST 90 tablet 3   SYRINGE-NEEDLE, DISP, 3 ML 25G X 1" 3 ML MISC Patient is to use to self inject 1 ml of vitamin B12 per provider instructions. 10 each 1   tamsulosin (FLOMAX) 0.4 MG CAPS capsule Take 1 capsule (0.4 mg total) by mouth daily. 90 capsule 3   No current facility-administered medications on file prior to visit.    Allergies  Allergen Reactions   Imdur [Isosorbide Dinitrate] Other (See Comments)    Headache at 60 mg dose, but able to tolerate a 30 mg per day   Iodine-131 Other (See Comments)    "Passed out"   Iohexol Anaphylaxis and Other (See Comments)    OK with 13 hour prep   Lisinopril Other (See Comments)    Elevated cr- improved off med   Pravastatin Nausea Only and  Other (See Comments)    Intense headache   Farxiga [Dapagliflozin]     Rash   Codeine Itching   Simvastatin Other (See Comments)    Edema   Statins Other (See Comments)    Significant edema on simvastatin    Past Medical History:  Diagnosis Date   AAA (abdominal aortic aneurysm) without rupture (HCC) 02/14/2014   [place] 1/19:  AAA 3 cm   ALCOHOL ABUSE, HX OF    distant history   Anxiety    occ panic sx, increased after death of mother   ANXIETY DEPRESSION 05/10/2007   history of, during difficult relationship   CAD (coronary artery disease) 08/28/2012   08/25/2012 Successful PTCA/DES x mid LAD // LAD prox 30, mid stent patent; D1 40; LCx ok; OM2 50, 50; RCA prox 20; EF 55-65  // Nuclear stress test 9/19: EF 58, small inferobasal infarct, no ischemia, Low Risk (inf defect ? 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 Sherene Sires, pulmonary     Past Surgical History:  Procedure Laterality Date    APPENDECTOMY     BACK SURGERY  03/02/1995   lower back x3   CARPAL TUNNEL RELEASE Right 2022   CARPOMETACARPAL (CMC) FUSION OF THUMB Left 04/02/2014   Procedure: CARPOMETACARPAL (CMC) FUSION OF THUMB/LEFT THUMB INTERPHALNGEAL JOINT FUSION;  Surgeon: Jodi Marble, MD;  Location: [facility];  Service: Orthopedics;  Laterality: Left;   COLONOSCOPY     CORONARY STENT PLACEMENT     2.75 x 12 mm Promus Premier DES was deployed in the mid LAD. The stent was post-dilated with a 3.0 x 9 mm [place] balloon - Mid LAD.   LARYNGOSCOPY / BRONCHOSCOPY / ESOPHAGOSCOPY  03/01/2010   bx   LEFT HEART CATH AND CORONARY ANGIOGRAPHY N/A 05/28/2016   Procedure: Left Heart Cath and Coronary Angiography;  Surgeon: Lyn Records, MD;  Location: [facility] INVASIVE CV LAB;  Service: Cardiovascular;  Laterality: N/A;   LEFT HEART CATHETERIZATION WITH CORONARY ANGIOGRAM N/A 08/25/2012   Procedure: LEFT HEART CATHETERIZATION WITH CORONARY ANGIOGRAM;  Surgeon: Kathleene Hazel, MD;  Location: [facility] CATH LAB;  Service: Cardiovascular;  Laterality: N/A;   LEFT HEART CATHETERIZATION WITH CORONARY ANGIOGRAM N/A 11/23/2013   Procedure: LEFT HEART CATHETERIZATION WITH CORONARY ANGIOGRAM;  Surgeon: Corky Crafts, MD;  Location: [facility] CATH LAB;  Service: Cardiovascular;  Laterality: N/A;   TRANSCAROTID ARTERY REVASCULARIZATION  Right 10/15/2019   Procedure: RIGHT TRANSCAROTID ARTERY REVASCULARIZATION;  Surgeon: Cephus Shelling, MD;  Location: [facility] OR;  Service: Vascular;  Laterality: Right;   ULTRASOUND GUIDANCE FOR VASCULAR ACCESS Left 10/15/2019   Procedure: ULTRASOUND GUIDANCE FOR VASCULAR ACCESS;  Surgeon: Cephus Shelling, MD;  Location: [facility] OR;  Service: Vascular;  Laterality: Left;   WRIST FRACTURE SURGERY  08/21/2010   Right wrist x4    Social History   Tobacco Use  Smoking Status Former   Current packs/day: 0.00   Types: Cigarettes   Quit date: 09/30/2010   Years since quitting: 12.0  Smokeless  Tobacco Former   He is a retired Engineer, drilling.  He does lots of yard work, garden work, fishing.  Social History   Substance and Sexual Activity  Alcohol Use Not Currently    Family History  Problem Relation Age of Onset   Brain cancer Father    Dementia Father        h/o brain tumor   Heart disease Mother    Hypertension Mother    Heart disease Brother    Hypertension Brother    Hyperlipidemia Brother    Colon polyps Brother    Colon cancer Neg Hx    Prostate cancer Neg  Hx    Esophageal cancer Neg Hx    Pancreatic cancer Neg Hx    Liver disease Neg Hx    Throat cancer Neg Hx     Reviw of Systems:  Reviewed in the HPI.  All other systems are negative.  Physical Exam: There were no vitals taken for this visit.  No BP recorded.  {Refresh Note OR Click here to enter BP  :1}***    GEN:  Well nourished, well developed in no acute distress HEENT: Normal NECK: No JVD; No carotid bruits LYMPHATICS: No lymphadenopathy CARDIAC: RRR ***, no murmurs, rubs, gallops RESPIRATORY:  Clear to auscultation without rales, wheezing or rhonchi  ABDOMEN: Soft, non-tender, non-distended MUSCULOSKELETAL:  No edema; No deformity  SKIN: Warm and dry NEUROLOGIC:  Alert and oriented x 3     ECG:          Assessment / Plan:   1. CAD -  08/25/12 -   S/p stenting      2. Hypertension -   3. Hypothyroidism:      4. Hyperlipidemia:     5.  PAD:   S/p stenting of his right carotid artery .  Is back on ASA , plavix,  ***   6.  Chronic kidney disease: He is followed by nephrology.    Kristeen Miss, MD  10/24/2022 6:47 AM    [facility] [address] [place],  [address] [place], [place]  [address] Pager [phone] Phone: [phone]; Fax: [phone]

## 2022-10-25 ENCOUNTER — Ambulatory Visit: Payer: Medicare Other | Attending: Cardiovascular Disease | Admitting: Cardiovascular Disease

## 2022-10-26 ENCOUNTER — Encounter: Payer: Self-pay | Admitting: Cardiovascular Disease

## 2022-10-27 ENCOUNTER — Encounter: Payer: Self-pay | Admitting: Cardiovascular Disease

## 2022-10-27 NOTE — Progress Notes (Unsigned)
Tony Hunt Date of Birth  Oct 28, 1955       [facility] Office [address], [address]  [address] [place], [place]  [address]   [place], [place]  [address] [phone]     [phone]   Fax  [phone]    Fax [phone]  Problem List: 1. CAD -  08/25/12 - 2.75 x 12 mm Promus Premier DES was deployed in the mid LAD. The stent was post-dilated with a 3.0 x 9 mm [place] balloon - Mid LAD. 2. Hypertension 3. Hypothyroidism 4. hyperlipidemia  Previous Notes :   Tony Hunt is a 67 yo with hx of progressive CP.  These pains have progressed over the past several weeks.  Initially he had only one or 2 times per week. These pains have progressed and he now has several episodes of chest pain each day.  The pains are associated with some dyspnea and lightheadness.   They are not associated with exercise, eating, drinking, change of position. The pains seem to last about 1 minute.  He has not found anything that specifically relieves the pain. They tend to resolve spontaneously.  He had a particularly bad episode of chest discomfort early this week and went to the emergency room. His workup there was unremarkable.  He's had progressive angina. He had an episode of chest pain the office today. The there were no associated EKG changes.  August 30, 2012:  Tony Hunt was seen last week with [facility].  He had a stent placed in his mid left anterior descending artery. He was discharged the next day but started having severe headaches and night sweats. Initially he thought it may be due to the Plavix but ultimately he decided that it seemed to be more related to the Pravachol.  He's feeling much better at this time.  Sept. 23, 2014:  He had several weeks of intermittant CP ( lasted 2-3 minutes) after getting his stent.  Now , these have resolved.  He is back doing all of his normal activities without CP.  He joined the [employer] last week.  He has been 1 time.   He has noticed  some easy bleeding.    11/22/2013:  He has been having more angina CP - typically occurs at rest.  Lasts a few seconds.  Occasionally longer.  Relieved with NTG Walking lots. 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   [facility] .   Dec. 18, 2015: And 1 was having some chest pain when I last done in September. Cardiac admission did not reveal any significant stenosis. History of PCI.  Has been doing better. Not exercising as much.  No trouble deer hunting this past fall.   August 27, 2014:  Doing well.  Hunts and fishes regularly . No angina   Jan. 17, 2017:  Tony Hunt is seen for follow up of his CAD' No significant episodes of CP  Still active.    Oct. 16, 2017:  Tony Hunt is seen today for follow up of his CAD. Has already started bow hunting Is retired from the post office and the [employer] .  May 26, 2016:  Tony Hunt has had marked dyspnea at rest and with exertion.  No fever, no cough Chest tightness / pain .   radiates through to back .  Last 5-10 minutes, less if he takes SL NTG .  Similar to angina  prior to stenting in 2014  Has been going on for 2 weeks.   Got worse this weekend Took 3 SL NTG.    August 05, 2016:  Seen for follow up of his CAD, hypertension and hyperlipidemia. Still having some dyspnea and DOE Cath showed no significant CAD Former smoker , quit 7 years ago   March 16, 2017:  Tony Hunt is seen today  No CP or dyspnea.   [facility] several times a [facility]   Hunted a lot this past winter   Sept. 3, 2020  Tony Hunt is seen today  Hx of CAD - PCI in the past  No CP , no dyspnea Some exercise.  Walks near [place].   Recent labs  Creatinine is mildly elevated, - chronic issue for him  Chol = 184 HDL is 34 LDL = 106    Sept. 7, 2021:  Tony Hunt is seen for follow up of his CAD, AAA ,  Had a   Right carotid artery transcatheter angioplasty with stent placement (right TCAR, transcarotid stent system) by  Dr. Chestine Spore . Getting along fairly well   Nov. 29, 2021: Tony Hunt is seen today for follow up of his cad, AAA,  Had right carotid stenting by Dr Chestine Spore He was recently in the hosptital with chest pain lasting 6 hours Troponin was negative Has ckd - Creatinine is 1.77 Chol is very well controlled .  LDL is 12  Was hospitalized with chest pain  Woke from a nap like a "knee in his chest " Lasted for several hours ,  Dry heaving . Tried 2 SL NTG - ddi not help  No sweats, no dyspnea  troponins were negative x 3 Has not had any pain since. Had not had anything to eat that day .  He is back doing his normal activities now.  Has not been exercising .  Usually walks quite a bit   August 01, 2020: Tony Hunt is seen today for follow up of his CAD, AAA, carotid artery disease  No further cp . Walking quite a bit,  Lost 8 lbs over the past several weeks .   BP looks great .   Labs from June 1 look good LDL is 22, Chol is 83 HDL is 38  Creatinine is 1.81.   He follows with nephrology   Aug. 26, 2024  No show   Aug. 29, 2024   Tony Hunt is seen for follow up of his CAD, AAA ( followed by [name], MD at [facility] )  , carotid artery disease Diastolic CHF I last saw him in 2022 ***    Current Outpatient Medications on File Prior to Visit  Medication Sig Dispense Refill   fenofibrate (TRICOR) 48 MG tablet Take 1 tablet (48 mg total) by mouth daily. 90 tablet 3   aspirin EC 81 MG tablet Take 81 mg by mouth daily. Swallow whole.     clopidogrel (PLAVIX) 75 MG tablet Take 1 tablet (75 mg total) by mouth daily with breakfast. 90 tablet 3   cyanocobalamin (VITAMIN B12) 1000 MCG/ML injection AT THE START OF THERAPY INJECT IM WEEKLY FOR 4 WEEKS AND THEN MONTHLY THEREAFTER, FOR VITAMIN B12 DEFICIENCY 3 mL 3   fluticasone (CUTIVATE) 0.05 % cream Apply topically.     furosemide (LASIX) 20 MG tablet TAKE 1 TABLET BY MOUTH EVERY DAY AS NEEDED FOR FLUID OR EDEMA 30 tablet 11   gabapentin  (NEURONTIN) 300 MG capsule Take 1  capsule (300 mg total) by mouth at bedtime. 90 capsule 3   isosorbide mononitrate (IMDUR) 30 MG 24 hr tablet Take 0.5 tablets (15 mg total) by mouth daily. 45 tablet 0   ketoconazole (NIZORAL) 2 % cream Apply 1 Application topically 2 (two) times daily.     levocetirizine (XYZAL) 5 MG tablet Take 1 tablet (5 mg total) by mouth every evening. 90 tablet 3   losartan (COZAAR) 25 MG tablet Take 12.5 mg by mouth daily.     melatonin 3 MG TABS tablet Take 3 mg by mouth at bedtime as needed (for sleep).      metroNIDAZOLE (METROCREAM) 0.75 % cream Apply 1 Application topically 2 (two) times daily.     nitroGLYCERIN (NITROSTAT) 0.4 MG SL tablet Place 1 tablet (0.4 mg total) under the tongue every 5 (five) minutes as needed for chest pain (do not exceed 3 pills during one episode). 25 tablet 6   oxyCODONE (ROXICODONE) 5 MG immediate release tablet Take 0.5-1 tablets (2.5-5 mg total) by mouth 3 (three) times daily as needed. 15 tablet 0   pantoprazole (PROTONIX) 40 MG tablet TAKE 1 TABLET DAILY 90 tablet 0   REPATHA SURECLICK 140 MG/ML SOAJ INJECT 1 PEN INTO THE SKIN EVERY 14 DAYS 6 mL 3   rosuvastatin (CRESTOR) 20 MG tablet TAKE 1 TABLET DAILY 90 tablet 3   SYNTHROID 100 MCG tablet TAKE 1 TABLET DAILY BEFORE BREAKFAST 90 tablet 3   SYRINGE-NEEDLE, DISP, 3 ML 25G X 1" 3 ML MISC Patient is to use to self inject 1 ml of vitamin B12 per provider instructions. 10 each 1   tamsulosin (FLOMAX) 0.4 MG CAPS capsule Take 1 capsule (0.4 mg total) by mouth daily. 90 capsule 3   No current facility-administered medications on file prior to visit.    Allergies  Allergen Reactions   Imdur [Isosorbide Dinitrate] Other (See Comments)    Headache at 60 mg dose, but able to tolerate a 30 mg per day   Iodine-131 Other (See Comments)    "Passed out"   Iohexol Anaphylaxis and Other (See Comments)    OK with 13 hour prep   Lisinopril Other (See Comments)    Elevated cr- improved off med    Pravastatin Nausea Only and Other (See Comments)    Intense headache   Farxiga [Dapagliflozin]     Rash   Codeine Itching   Simvastatin Other (See Comments)    Edema   Statins Other (See Comments)    Significant edema on simvastatin    Past Medical History:  Diagnosis Date   AAA (abdominal aortic aneurysm) without rupture (HCC) 02/14/2014   [place] 1/19:  AAA 3 cm   ALCOHOL ABUSE, HX OF    distant history   Anxiety    occ panic sx, increased after death of mother   ANXIETY DEPRESSION 05/10/2007   history of, during difficult relationship   CAD (coronary artery disease) 08/28/2012   08/25/2012 Successful PTCA/DES x mid LAD // LAD prox 30, mid stent patent; D1 40; LCx ok; OM2 50, 50; RCA prox 20; EF 55-65  // Nuclear stress test 9/19: EF 58, small inferobasal infarct, no ischemia, Low Risk (inf defect ? 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 Sherene Sires, pulmonary     Past Surgical History:   Procedure Laterality Date   APPENDECTOMY     BACK SURGERY  03/02/1995   lower back x3   CARPAL TUNNEL RELEASE Right 2022   CARPOMETACARPAL (CMC) FUSION OF THUMB Left 04/02/2014   Procedure: CARPOMETACARPAL (CMC) FUSION OF THUMB/LEFT THUMB INTERPHALNGEAL JOINT FUSION;  Surgeon: Jodi Marble, MD;  Location: [facility];  Service: Orthopedics;  Laterality: Left;   COLONOSCOPY     CORONARY STENT PLACEMENT     2.75 x 12 mm Promus Premier DES was deployed in the mid LAD. The stent was post-dilated with a 3.0 x 9 mm [place] balloon - Mid LAD.   LARYNGOSCOPY / BRONCHOSCOPY / ESOPHAGOSCOPY  03/01/2010   bx   LEFT HEART CATH AND CORONARY ANGIOGRAPHY N/A 05/28/2016   Procedure: Left Heart Cath and Coronary Angiography;  Surgeon: Lyn Records, MD;  Location: [facility] INVASIVE CV LAB;  Service: Cardiovascular;  Laterality: N/A;   LEFT HEART CATHETERIZATION WITH CORONARY ANGIOGRAM N/A 08/25/2012   Procedure: LEFT HEART CATHETERIZATION WITH CORONARY ANGIOGRAM;  Surgeon: Kathleene Hazel, MD;  Location: [facility] CATH LAB;  Service: Cardiovascular;  Laterality: N/A;   LEFT HEART CATHETERIZATION WITH CORONARY ANGIOGRAM N/A 11/23/2013   Procedure: LEFT HEART CATHETERIZATION WITH CORONARY ANGIOGRAM;  Surgeon: Corky Crafts, MD;  Location: [facility] CATH LAB;  Service: Cardiovascular;  Laterality: N/A;   TRANSCAROTID ARTERY REVASCULARIZATION  Right 10/15/2019   Procedure: RIGHT TRANSCAROTID ARTERY REVASCULARIZATION;  Surgeon: Cephus Shelling, MD;  Location: [facility] OR;  Service: Vascular;  Laterality: Right;   ULTRASOUND GUIDANCE FOR VASCULAR ACCESS Left 10/15/2019   Procedure: ULTRASOUND GUIDANCE FOR VASCULAR ACCESS;  Surgeon: Cephus Shelling, MD;  Location: [facility] OR;  Service: Vascular;  Laterality: Left;   WRIST FRACTURE SURGERY  08/21/2010   Right wrist x4    Social History   Tobacco Use  Smoking Status Former   Current packs/day: 0.00   Types: Cigarettes   Quit date: 09/30/2010   Years  since quitting: 12.0  Smokeless Tobacco Former   He is a retired Engineer, drilling.  He does lots of yard work, garden work, fishing.  Social History   Substance and Sexual Activity  Alcohol Use Not Currently    Family History  Problem Relation Age of Onset   Brain cancer Father    Dementia Father        h/o brain tumor   Heart disease Mother    Hypertension Mother    Heart disease Brother    Hypertension Brother    Hyperlipidemia Brother    Colon polyps Brother    Colon cancer Neg  Hx    Prostate cancer Neg Hx    Esophageal cancer Neg Hx    Pancreatic cancer Neg Hx    Liver disease Neg Hx    Throat cancer Neg Hx     Reviw of Systems:  Reviewed in the HPI.  All other systems are negative.  Physical Exam: There were no vitals taken for this visit.  No BP recorded.  {Refresh Note OR Click here to enter BP  :1}***    GEN:  Well nourished, well developed in no acute distress HEENT: Normal NECK: No JVD; No carotid bruits LYMPHATICS: No lymphadenopathy CARDIAC: RRR ***, no murmurs, rubs, gallops RESPIRATORY:  Clear to auscultation without rales, wheezing or rhonchi  ABDOMEN: Soft, non-tender, non-distended MUSCULOSKELETAL:  No edema; No deformity  SKIN: Warm and dry NEUROLOGIC:  Alert and oriented x 3     ECG:          Assessment / Plan:   1. CAD -  08/25/12 -   S/p stenting      2. Hypertension -   3. Hypothyroidism:      4. Hyperlipidemia:     5.  PAD:   S/p stenting of his right carotid artery .  Is back on ASA , plavix,  ***   6.  Chronic kidney disease: He is followed by nephrology.    Kristeen Miss, MD  10/27/2022 7:17 PM    [facility] [address] [place],  [address] [place], [place]  [address] Pager [phone] Phone: [phone]; Fax: [phone]

## 2022-10-28 ENCOUNTER — Ambulatory Visit: Payer: Medicare Other | Attending: Cardiovascular Disease | Admitting: Cardiovascular Disease

## 2022-10-28 VITALS — BP 110/68 | HR 80 | Ht 67.0 in | Wt 195.6 lb

## 2022-10-28 DIAGNOSIS — I5032 Chronic diastolic (congestive) heart failure: Secondary | ICD-10-CM

## 2022-10-28 DIAGNOSIS — I10 Essential (primary) hypertension: Secondary | ICD-10-CM | POA: Diagnosis not present

## 2022-10-28 DIAGNOSIS — E782 Mixed hyperlipidemia: Secondary | ICD-10-CM | POA: Diagnosis not present

## 2022-10-28 DIAGNOSIS — I2510 Atherosclerotic heart disease of native coronary artery without angina pectoris: Secondary | ICD-10-CM | POA: Diagnosis not present

## 2022-10-28 MED ORDER — POTASSIUM CHLORIDE CRYS ER 10 MEQ PO TBCR: 10.0000 meq | EXTENDED_RELEASE_TABLET | Freq: Every day | ORAL | 6 refills | Status: AC | PRN

## 2022-10-28 MED ORDER — FUROSEMIDE 20 MG PO TABS: 20.0000 mg | ORAL_TABLET | Freq: Every day | ORAL | 6 refills | Status: AC | PRN

## 2022-10-28 NOTE — Patient Instructions (Signed)
Medication Instructions:   TAKE LASIX 20 MG BY MOUTH DAILY AS NEEDED FOR LOWER EXTREMITY SWELLING.  WHEN YOU ADMINISTER THIS MEDICATION PLEASE ADMINISTER A POTASSIUM PILL WITH THIS.   TAKE POTASSIUM CHLORIDE 10 mEq BY MOUTH DAILY AS NEEDED WHEN YOU ADMINISTER YOUR DAILY AS NEEDED LASIX  *If you need a refill on your cardiac medications before your next appointment, please call your pharmacy*   Follow-Up: At [facility], you and your health needs are our priority.  As part of our continuing mission to provide you with exceptional heart care, we have created designated Provider Care Teams.  These Care Teams include your primary Cardiologist (physician) and Advanced Practice Providers (APPs -  Physician Assistants and Nurse Practitioners) who all work together to provide you with the care you need, when you need it.  We recommend signing up for the patient portal called "MyChart".  Sign up information is provided on this After Visit Summary.  MyChart is used to connect with patients for Virtual Visits (Telemedicine).  Patients are able to view lab/test results, encounter notes, upcoming appointments, etc.  Non-urgent messages can be sent to your provider as well.   To learn more about what you can do with MyChart, go to [email or web address].    Your next appointment:   1 year(s)  Provider:   Jari Favre, PA-C, Eligha Bridegroom, NP, or Tereso Newcomer, PA-C

## 2022-11-12 DIAGNOSIS — R809 Proteinuria, unspecified: Secondary | ICD-10-CM | POA: Diagnosis not present

## 2022-11-12 DIAGNOSIS — I129 Hypertensive chronic kidney disease with stage 1 through stage 4 chronic kidney disease, or unspecified chronic kidney disease: Secondary | ICD-10-CM | POA: Diagnosis not present

## 2022-11-12 DIAGNOSIS — I2510 Atherosclerotic heart disease of native coronary artery without angina pectoris: Secondary | ICD-10-CM | POA: Diagnosis not present

## 2022-11-12 DIAGNOSIS — N1831 Chronic kidney disease, stage 3a: Secondary | ICD-10-CM | POA: Diagnosis not present

## 2022-11-12 DIAGNOSIS — N051 Unspecified nephritic syndrome with focal and segmental glomerular lesions: Secondary | ICD-10-CM | POA: Diagnosis not present

## 2022-11-13 LAB — LAB REPORT - SCANNED
Creatinine, POC: 73.5 mg/dL
EGFR: 35

## 2022-11-15 DIAGNOSIS — M47812 Spondylosis without myelopathy or radiculopathy, cervical region: Secondary | ICD-10-CM | POA: Diagnosis not present

## 2022-11-15 DIAGNOSIS — M791 Myalgia, unspecified site: Secondary | ICD-10-CM | POA: Diagnosis not present

## 2022-11-30 ENCOUNTER — Other Ambulatory Visit: Payer: Self-pay | Admitting: *Deleted

## 2022-11-30 DIAGNOSIS — I779 Disorder of arteries and arterioles, unspecified: Secondary | ICD-10-CM

## 2022-11-30 DIAGNOSIS — I7143 Infrarenal abdominal aortic aneurysm, without rupture: Secondary | ICD-10-CM

## 2022-12-09 DIAGNOSIS — L570 Actinic keratosis: Secondary | ICD-10-CM | POA: Diagnosis not present

## 2022-12-09 DIAGNOSIS — L538 Other specified erythematous conditions: Secondary | ICD-10-CM | POA: Diagnosis not present

## 2022-12-09 DIAGNOSIS — L820 Inflamed seborrheic keratosis: Secondary | ICD-10-CM | POA: Diagnosis not present

## 2022-12-09 DIAGNOSIS — L578 Other skin changes due to chronic exposure to nonionizing radiation: Secondary | ICD-10-CM | POA: Diagnosis not present

## 2022-12-13 NOTE — Progress Notes (Unsigned)
Patient name: Tony Hunt MRN: [id number] DOB: 12-18-1955 Sex: male  REASON FOR VISIT: 1 year follow-up for surveillance of carotid disease after right TCAR and surveillance of abdominal aortic aneurysm  HPI: Tony Hunt is a 67 y.o. male with history of abdominal aortic aneurysm, laryngeal cancer status post radiation, coronary disease status post remote PCI, hypertension, hyperlipidemia and heart failure that presents for 1 year follow-up for surveillance carotid disease and AAA.  He had a right TCAR procedure on 10/15/2019 for an asymptomatic high-grade stenosis greater than 80% in the setting of prior neck radiation.  He denies any neurologic events over the last year.  He remains on aspirin statin Plavix.  We are also following a 3.6 cm abdominal aortic aneurysm that was last measured on 11/10/21.  Past Medical History:  Diagnosis Date   AAA (abdominal aortic aneurysm) without rupture (HCC) 02/14/2014   [place] 1/19:  AAA 3 cm   ALCOHOL ABUSE, HX OF    distant history   Anxiety    occ panic sx, increased after death of mother   ANXIETY DEPRESSION 05/10/2007   history of, during difficult relationship   CAD (coronary artery disease) 08/28/2012   08/25/2012 Successful PTCA/DES x mid LAD // LAD prox 30, mid stent patent; D1 40; LCx ok; OM2 50, 50; RCA prox 20; EF 55-65  // Nuclear stress test 9/19: EF 58, small inferobasal infarct, no ischemia, Low Risk (inf defect ? 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 Sherene Sires, pulmonary     Past Surgical History:  Procedure Laterality Date   APPENDECTOMY     BACK SURGERY  03/02/1995   lower back x3   CARPAL TUNNEL RELEASE Right 2022   CARPOMETACARPAL (CMC) FUSION OF THUMB Left 04/02/2014   Procedure: CARPOMETACARPAL (CMC) FUSION OF THUMB/LEFT THUMB INTERPHALNGEAL JOINT FUSION;  Surgeon: Jodi Marble, MD;  Location: [facility];  Service: Orthopedics;  Laterality: Left;   COLONOSCOPY     CORONARY STENT PLACEMENT     2.75 x 12 mm Promus Premier DES was deployed in the mid LAD. The stent was post-dilated with a 3.0 x 9 mm [place] balloon - Mid LAD.   LARYNGOSCOPY / BRONCHOSCOPY / ESOPHAGOSCOPY  03/01/2010   bx   LEFT HEART CATH AND CORONARY ANGIOGRAPHY N/A 05/28/2016   Procedure: Left Heart Cath and Coronary Angiography;  Surgeon: Barry Dienes  Katrinka Blazing, MD;  Location: [facility];  Service: Cardiovascular;  Laterality: N/A;   LEFT HEART CATHETERIZATION WITH CORONARY ANGIOGRAM N/A 08/25/2012   Procedure: LEFT HEART CATHETERIZATION WITH CORONARY ANGIOGRAM;  Surgeon: Kathleene Hazel, MD;  Location: [facility] CATH LAB;  Service: Cardiovascular;  Laterality: N/A;   LEFT HEART CATHETERIZATION WITH CORONARY ANGIOGRAM N/A 11/23/2013   Procedure: LEFT HEART CATHETERIZATION WITH CORONARY ANGIOGRAM;  Surgeon: Corky Crafts, MD;  Location: [facility] CATH LAB;  Service: Cardiovascular;  Laterality: N/A;   TRANSCAROTID ARTERY REVASCULARIZATION   Right 10/15/2019   Procedure: RIGHT TRANSCAROTID ARTERY REVASCULARIZATION;  Surgeon: Cephus Shelling, MD;  Location: [facility] OR;  Service: Vascular;  Laterality: Right;   ULTRASOUND GUIDANCE FOR VASCULAR ACCESS Left 10/15/2019   Procedure: ULTRASOUND GUIDANCE FOR VASCULAR ACCESS;  Surgeon: Cephus Shelling, MD;  Location: [facility] OR;  Service: Vascular;  Laterality: Left;   WRIST FRACTURE SURGERY  08/21/2010   Right wrist x4    Family History  Problem Relation Age of Onset   Brain cancer Father    Dementia Father        h/o brain tumor   Heart disease Mother    Hypertension Mother    Heart disease Brother    Hypertension Brother    Hyperlipidemia Brother    Colon polyps Brother    Colon cancer Neg Hx    Prostate cancer Neg Hx    Esophageal cancer Neg Hx    Pancreatic cancer Neg Hx    Liver disease Neg Hx    Throat cancer Neg Hx     SOCIAL HISTORY: Social History   Tobacco Use   Smoking status: Former    Current packs/day: 0.00    Types: Cigarettes    Quit date: 09/30/2010    Years since quitting: 12.2   Smokeless tobacco: Former  Substance Use Topics   Alcohol use: Not Currently    Allergies  Allergen Reactions   Imdur [Isosorbide Dinitrate] Other (See Comments)    Headache at 60 mg dose, but able to tolerate a 30 mg per day   Iodine-131 Other (See Comments)    "Passed out"   Iohexol Anaphylaxis and Other (See Comments)    OK with 13 hour prep   Lisinopril Other (See Comments)    Elevated cr- improved off med   Pravastatin Nausea Only and Other (See Comments)    Intense headache   Farxiga [Dapagliflozin]     Rash   Codeine Itching   Simvastatin Other (See Comments)    Edema   Statins Other (See Comments)    Significant edema on simvastatin    Current Outpatient Medications  Medication Sig Dispense Refill   aspirin EC 81 MG tablet Take 81 mg by mouth daily. Swallow whole.     clopidogrel (PLAVIX) 75 MG tablet Take 1 tablet (75 mg total) by mouth daily with  breakfast. 90 tablet 3   cyanocobalamin (VITAMIN B12) 1000 MCG/ML injection AT THE START OF THERAPY INJECT IM WEEKLY FOR 4 WEEKS AND THEN MONTHLY THEREAFTER, FOR VITAMIN B12 DEFICIENCY 3 mL 3   fenofibrate (TRICOR) 48 MG tablet Take 1 tablet (48 mg total) by mouth daily. 90 tablet 3   fluticasone (CUTIVATE) 0.05 % cream Apply topically.     furosemide (LASIX) 20 MG tablet Take 1 tablet (20 mg total) by mouth daily as needed (for lower extremity swelling). 30 tablet 6   gabapentin (NEURONTIN) 300 MG capsule Take 1 capsule (300 mg total) by mouth at bedtime.  90 capsule 3   isosorbide mononitrate (IMDUR) 30 MG 24 hr tablet Take 0.5 tablets (15 mg total) by mouth daily. 45 tablet 0   ketoconazole (NIZORAL) 2 % cream Apply 1 Application topically 2 (two) times daily.     levocetirizine (XYZAL) 5 MG tablet Take 1 tablet (5 mg total) by mouth every evening. 90 tablet 3   losartan (COZAAR) 25 MG tablet Take 12.5 mg by mouth daily.     melatonin 3 MG TABS tablet Take 3 mg by mouth at bedtime as needed (for sleep).      metroNIDAZOLE (METROCREAM) 0.75 % cream Apply 1 Application topically 2 (two) times daily.     nitroGLYCERIN (NITROSTAT) 0.4 MG SL tablet Place 1 tablet (0.4 mg total) under the tongue every 5 (five) minutes as needed for chest pain (do not exceed 3 pills during one episode). 25 tablet 6   oxyCODONE (ROXICODONE) 5 MG immediate release tablet Take 0.5-1 tablets (2.5-5 mg total) by mouth 3 (three) times daily as needed. 15 tablet 0   pantoprazole (PROTONIX) 40 MG tablet TAKE 1 TABLET DAILY 90 tablet 0   potassium chloride (KLOR-CON M) 10 MEQ tablet Take 1 tablet (10 mEq total) by mouth daily as needed (take with daily as needed lasix.). 30 tablet 6   REPATHA SURECLICK 140 MG/ML SOAJ INJECT 1 PEN INTO THE SKIN EVERY 14 DAYS 6 mL 3   rosuvastatin (CRESTOR) 20 MG tablet TAKE 1 TABLET DAILY 90 tablet 3   SYNTHROID 100 MCG tablet TAKE 1 TABLET DAILY BEFORE BREAKFAST 90 tablet 3    SYRINGE-NEEDLE, DISP, 3 ML 25G X 1" 3 ML MISC Patient is to use to self inject 1 ml of vitamin B12 per provider instructions. 10 each 1   tamsulosin (FLOMAX) 0.4 MG CAPS capsule Take 1 capsule (0.4 mg total) by mouth daily. 90 capsule 3   No current facility-administered medications for this visit.    REVIEW OF SYSTEMS:  [X]  denotes positive finding, [ ]  denotes negative finding Cardiac  Comments:  Chest pain or chest pressure:    Shortness of breath upon exertion:    Short of breath when lying flat:    Irregular heart rhythm:        Vascular    Pain in calf, thigh, or hip brought on by ambulation:    Pain in feet at night that wakes you up from your sleep:     Blood clot in your veins:    Leg swelling:         Pulmonary    Oxygen at home:    Productive cough:     Wheezing:         Neurologic    Sudden weakness in arms or legs:     Sudden numbness in arms or legs:     Sudden onset of difficulty speaking or slurred speech:    Temporary loss of vision in one eye:     Problems with dizziness:         Gastrointestinal    Blood in stool:     Vomited blood:         Genitourinary    Burning when urinating:     Blood in urine:        Psychiatric    Major depression:         Hematologic    Bleeding problems:    Problems with blood clotting too easily:        Skin    Rashes  or ulcers:        Constitutional    Fever or chills:      PHYSICAL EXAM: There were no vitals filed for this visit.   GENERAL: The patient is a well-nourished male, in no acute distress. The vital signs are documented above. CARDIAC: There is a regular rate and rhythm.  VASCULAR:  Right neck incision healed without issue PULMONARY: No respiratory distress. ABDOMEN: Soft and non-tender.  No pain with palpation of aneurysm.  MUSCULOSKELETAL: There are no major deformities or cyanosis. NEUROLOGIC: No focal weakness or paresthesias are detected.  CN II-XII grossly intact.   DATA:   Carotid  duplex today shows patent right carotid stent with no re-stenosis and 40-59% left ICA stenosis.  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  He is status post right TCAR on 10/15/2019 for an asymptomatic high-grade stenosis greater than 80% in the setting of previous neck radiation.  Carotid duplex today shows widely patent right carotid stent.  He has 40-59% stenosis in the left ICA.  Discussed I will see him again in 1 year with carotid duplex for continued surveillance.  He needs to remain on aspirin Plavix statin.  AAA duplex today shows no significant change in his abdominal aortic aneurysm measuring 3.6 cm.  Discussed we will repeat a AAA duplex in 1 year as well.  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    Cephus Shelling, MD [facility] Office: [phone]

## 2022-12-14 ENCOUNTER — Encounter: Payer: Self-pay | Admitting: Vascular Surgery

## 2022-12-14 ENCOUNTER — Ambulatory Visit (HOSPITAL_COMMUNITY)
Admission: RE | Admit: 2022-12-14 | Discharge: 2022-12-14 | Disposition: A | Discharge status: Home / Self Care | Payer: Medicare Other | Source: Ambulatory Visit | Attending: Vascular Surgery | Admitting: Vascular Surgery

## 2022-12-14 ENCOUNTER — Ambulatory Visit (INDEPENDENT_AMBULATORY_CARE_PROVIDER_SITE_OTHER): Payer: Medicare Other | Admitting: Vascular Surgery

## 2022-12-14 ENCOUNTER — Ambulatory Visit (INDEPENDENT_AMBULATORY_CARE_PROVIDER_SITE_OTHER)
Admission: RE | Admit: 2022-12-14 | Discharge: 2022-12-14 | Disposition: A | Discharge status: Home / Self Care | Payer: Medicare Other | Source: Ambulatory Visit | Attending: Vascular Surgery | Admitting: Vascular Surgery

## 2022-12-14 VITALS — BP 155/80 | HR 57 | Temp 97.6°F | Resp 18 | Ht 67.0 in | Wt 198.7 lb

## 2022-12-14 DIAGNOSIS — I7143 Infrarenal abdominal aortic aneurysm, without rupture: Secondary | ICD-10-CM | POA: Diagnosis not present

## 2022-12-14 DIAGNOSIS — I779 Disorder of arteries and arterioles, unspecified: Secondary | ICD-10-CM | POA: Diagnosis present

## 2022-12-20 ENCOUNTER — Other Ambulatory Visit (INDEPENDENT_AMBULATORY_CARE_PROVIDER_SITE_OTHER): Payer: Medicare Other

## 2022-12-20 DIAGNOSIS — E538 Deficiency of other specified B group vitamins: Secondary | ICD-10-CM

## 2022-12-20 LAB — VITAMIN B12: Vitamin B-12: >1537 pg/mL — ABNORMAL HIGH (ref 211–911)

## 2022-12-26 ENCOUNTER — Other Ambulatory Visit: Payer: Self-pay | Admitting: Family Medicine

## 2022-12-26 DIAGNOSIS — E538 Deficiency of other specified B group vitamins: Secondary | ICD-10-CM

## 2022-12-26 MED ORDER — CYANOCOBALAMIN 1000 MCG/ML IJ SOLN
INTRAMUSCULAR | Status: DC
Start: 2022-12-26 — End: 2023-08-25

## 2023-01-06 ENCOUNTER — Other Ambulatory Visit: Payer: Self-pay | Admitting: Cardiovascular Disease

## 2023-01-07 ENCOUNTER — Other Ambulatory Visit: Payer: Self-pay

## 2023-01-07 DIAGNOSIS — I7143 Infrarenal abdominal aortic aneurysm, without rupture: Secondary | ICD-10-CM

## 2023-01-26 DIAGNOSIS — S46811A Strain of other muscles, fascia and tendons at shoulder and upper arm level, right arm, initial encounter: Secondary | ICD-10-CM | POA: Diagnosis not present

## 2023-01-26 DIAGNOSIS — M791 Myalgia, unspecified site: Secondary | ICD-10-CM | POA: Diagnosis not present

## 2023-01-26 DIAGNOSIS — S46812A Strain of other muscles, fascia and tendons at shoulder and upper arm level, left arm, initial encounter: Secondary | ICD-10-CM | POA: Diagnosis not present

## 2023-02-08 DIAGNOSIS — L538 Other specified erythematous conditions: Secondary | ICD-10-CM | POA: Diagnosis not present

## 2023-02-08 DIAGNOSIS — L570 Actinic keratosis: Secondary | ICD-10-CM | POA: Diagnosis not present

## 2023-02-08 DIAGNOSIS — L814 Other melanin hyperpigmentation: Secondary | ICD-10-CM | POA: Diagnosis not present

## 2023-02-08 DIAGNOSIS — L738 Other specified follicular disorders: Secondary | ICD-10-CM | POA: Diagnosis not present

## 2023-02-08 DIAGNOSIS — L821 Other seborrheic keratosis: Secondary | ICD-10-CM | POA: Diagnosis not present

## 2023-02-08 DIAGNOSIS — L820 Inflamed seborrheic keratosis: Secondary | ICD-10-CM | POA: Diagnosis not present

## 2023-02-08 DIAGNOSIS — L578 Other skin changes due to chronic exposure to nonionizing radiation: Secondary | ICD-10-CM | POA: Diagnosis not present

## 2023-02-08 DIAGNOSIS — D225 Melanocytic nevi of trunk: Secondary | ICD-10-CM | POA: Diagnosis not present

## 2023-02-08 DIAGNOSIS — Z09 Encounter for follow-up examination after completed treatment for conditions other than malignant neoplasm: Secondary | ICD-10-CM | POA: Diagnosis not present

## 2023-03-04 ENCOUNTER — Other Ambulatory Visit: Payer: Self-pay | Admitting: Cardiovascular Disease

## 2023-03-24 ENCOUNTER — Other Ambulatory Visit: Payer: Self-pay | Admitting: Family Medicine

## 2023-05-10 ENCOUNTER — Telehealth: Admitting: Physician Assistant

## 2023-05-10 ENCOUNTER — Ambulatory Visit: Payer: Self-pay | Admitting: Family Medicine

## 2023-05-10 DIAGNOSIS — H01001 Unspecified blepharitis right upper eyelid: Secondary | ICD-10-CM | POA: Diagnosis not present

## 2023-05-10 DIAGNOSIS — B9689 Other specified bacterial agents as the cause of diseases classified elsewhere: Secondary | ICD-10-CM

## 2023-05-10 DIAGNOSIS — J069 Acute upper respiratory infection, unspecified: Secondary | ICD-10-CM

## 2023-05-10 MED ORDER — DOXYCYCLINE HYCLATE 100 MG PO TABS: 100.0000 mg | ORAL_TABLET | Freq: Two times a day (BID) | ORAL | 0 refills | Status: DC

## 2023-05-10 MED ORDER — BENZONATATE 100 MG PO CAPS: 100.0000 mg | ORAL_CAPSULE | Freq: Three times a day (TID) | ORAL | 0 refills | Status: DC | PRN

## 2023-05-10 MED ORDER — ALBUTEROL SULFATE HFA 108 (90 BASE) MCG/ACT IN AERS: 2.0000 | INHALATION_SPRAY | Freq: Four times a day (QID) | RESPIRATORY_TRACT | 0 refills | Status: AC | PRN

## 2023-05-10 NOTE — Telephone Encounter (Signed)
 Just an [place]

## 2023-05-10 NOTE — Telephone Encounter (Signed)
 Copied from CRM [id number]. Topic: Clinical - Red Word Triage >> May 10, 2023 10:55 AM Tony Hunt wrote: Red Word that prompted transfer to Nurse Triage: Pain all over back and legs, chest congestion with dark colored mucus and can't sleep.  Chief Complaint: cough Symptoms: chest congestion, cough, brownish sputum, chills, body aches Frequency: Saturday Pertinent Negatives: Patient denies n/v Disposition: [] ED /[] Urgent Care (no appt availability in office) / [] Appointment(In office/virtual)/ [x]  [facility]/ [] Home Care/ [] Refused Recommended Disposition /[] [facility]/ []  Follow-up with PCP Additional Notes: offered to make pt appt to come in office to be evaluated: pt stated he does not have transportation and he wife is at work now: made virtual urgent care visit.  Pt stated some SOB w/exertion, runny nose.  Reason for Disposition  SEVERE coughing spells (e.g., whooping sound after coughing, vomiting after coughing)  Answer Assessment - Initial Assessment Questions 1. ONSET: "When did the cough begin?"      Saturday 2. SEVERITY: "How bad is the cough today?"      severe 3. SPUTUM: "Describe the color of your sputum" (none, dry cough; clear, white, yellow, green)     Clear and brownish color 4. HEMOPTYSIS: "Are you coughing up any blood?" If so ask: "How much?" (flecks, streaks, tablespoons, etc.)     no 5. DIFFICULTY BREATHING: "Are you having difficulty breathing?" If Yes, ask: "How bad is it?" (e.g., mild, moderate, severe)    - MILD: No SOB at rest, mild SOB with walking, speaks normally in sentences, can lie down, no retractions, pulse < 100.    - MODERATE: SOB at rest, SOB with minimal exertion and prefers to sit, cannot lie down flat, speaks in phrases, mild retractions, audible wheezing, pulse 100-120.    - SEVERE: Very SOB at rest, speaks in single words, struggling to breathe, sitting hunched forward, retractions, pulse > 120      Some SOB with  exertion 6. FEVER: "Do you have a fever?" If Yes, ask: "What is your temperature, how was it measured, and when did it start?"     Chills and body aches 7. CARDIAC HISTORY: "Do you have any history of heart disease?" (e.g., heart attack, congestive heart failure)      Heart stent 8. LUNG HISTORY: "Do you have any history of lung disease?"  (e.g., pulmonary embolus, asthma, emphysema)     no 9. PE RISK FACTORS: "Do you have a history of blood clots?" (or: recent major surgery, recent prolonged travel, bedridden)     no 10. OTHER SYMPTOMS: "Do you have any other symptoms?" (e.g., runny nose, wheezing, chest pain)       Cough, chest congestion,runny nose, bad taste in mouth, no smell or taste 11. PREGNANCY: "Is there any chance you are pregnant?" "When was your last menstrual period?"       N/a 12. TRAVEL: "Have you traveled out of the country in the last month?" (e.g., travel history, exposures)       no  Protocols used: Cough - Acute Productive-A-AH

## 2023-05-10 NOTE — Progress Notes (Signed)
 Virtual Visit Consent   Tony Hunt, you are scheduled for a virtual visit with a [facility] provider today. Just as with appointments in the office, your consent must be obtained to participate. Your consent will be active for this visit and any virtual visit you may have with one of our providers in the next 365 days. If you have a MyChart account, a copy of this consent can be sent to you electronically.  As this is a virtual visit, video technology does not allow for your provider to perform a traditional examination. This may limit your provider's ability to fully assess your condition. If your provider identifies any concerns that need to be evaluated in person or the need to arrange testing (such as labs, EKG, etc.), we will make arrangements to do so. Although advances in technology are sophisticated, we cannot ensure that it will always work on either your end or our end. If the connection with a video visit is poor, the visit may have to be switched to a telephone visit. With either a video or telephone visit, we are not always able to ensure that we have a secure connection.  By engaging in this virtual visit, you consent to the provision of healthcare and authorize for your insurance to be billed (if applicable) for the services provided during this visit. Depending on your insurance coverage, you may receive a charge related to this service.  I need to obtain your verbal consent now. Are you willing to proceed with your visit today? Tony Hunt has provided verbal consent on 05/10/2023 for a virtual visit (video or telephone). Piedad Climes, [place]  Date: 05/10/2023 3:48 PM   Virtual Visit via Video Note   I, Piedad Climes, connected with  Tony Hunt  ([id number], [birth year]) on 05/10/23 at  3:45 PM EDT by a video-enabled telemedicine application and verified that I am speaking with the correct person using two identifiers.  Location: Patient: Virtual Visit Location  Patient: Home Provider: Virtual Visit Location Provider: Home Office   I discussed the limitations of evaluation and management by telemedicine and the availability of in person appointments. The patient expressed understanding and agreed to proceed.    History of Present Illness: Tony Hunt is a 68 y.o. who identifies as a male who was assigned male at birth, and is being seen today for 5 days of URI symptoms starting with chest heaviness and fatigue, followed by mild aches. Initially with a fever that has resolved. Now chest congestion is substantial with clear to brown, thick phlegm. Notes chest tightness, wheezing and SOB with exertion only. Notes wife with similar symptoms first but much improved. His symptoms continue to progress.  Noted R upper eyelid welling and irritation. Denies palpable stye. 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  HPI: HPI  Problems:  Patient Active Problem List   Diagnosis Date Noted   Abdominal wall pain 08/19/2022   Pain of hand 12/10/2020   Chest pain 01/25/2020   H/O abnormal semen 12/30/2019   SOB (shortness of breath) 10/11/2019   Gallstones 08/05/2019   Hematochezia 05/17/2019   Abdominal pain, epigastric 05/17/2019   B12 deficiency 11/06/2018   Chronic diastolic CHF (congestive heart failure) (HCC) 12/21/2017   Health care maintenance 11/07/2017   Advance care planning 11/07/2017   Carotid artery disease (HCC) 09/12/2017   Migraine with aura 03/31/2017   Bloating 11/18/2016   Left carotid bruit 05/26/2016   Lower urinary tract symptoms (LUTS) 04/16/2016  Neck pain 02/05/2015   AAA (abdominal aortic aneurysm) without rupture (HCC) 02/14/2014   Lower abdominal pain 12/23/2013   Medicare annual wellness visit, subsequent 07/03/2013   Rash 01/08/2013   Gout 12/21/2012   CAD (coronary artery disease) 08/28/2012   Hypothyroidism 08/26/2012   Panic 03/07/2012   Laryngeal cancer (HCC) 07/07/2011   COUGH 02/19/2010   Hyperlipidemia  05/10/2007   ANXIETY DEPRESSION 05/10/2007   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   Essential hypertension 05/10/2007   ESOPHAGEAL STRICTURE 05/10/2007   GERD 05/10/2007   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/10/2007    Allergies:  Allergies  Allergen Reactions   Imdur [Isosorbide Dinitrate] Other (See Comments)    Headache at 60 mg dose, but able to tolerate a 30 mg per day   Iodine-131 Other (See Comments)    "Passed out"   Iohexol Anaphylaxis and Other (See Comments)    OK with 13 hour prep   Lisinopril Other (See Comments)    Elevated cr- improved off med   Pravastatin Nausea Only and Other (See Comments)    Intense headache   Farxiga [Dapagliflozin]     Rash   Codeine Itching   Simvastatin Other (See Comments)    Edema   Statins Other (See Comments)    Significant edema on simvastatin   Medications:  Current Outpatient Medications:    albuterol (VENTOLIN HFA) 108 (90 Base) MCG/ACT inhaler, Inhale 2 puffs into the lungs every 6 (six) hours as needed for wheezing or shortness of breath., Disp: 8 g, Rfl: 0   benzonatate (TESSALON) 100 MG capsule, Take 1 capsule (100 mg total) by mouth 3 (three) times daily as needed for cough., Disp: 30 capsule, Rfl: 0   doxycycline (VIBRA-TABS) 100 MG tablet, Take 1 tablet (100 mg total) by mouth 2 (two) times daily., Disp: 14 tablet, Rfl: 0   aspirin EC 81 MG tablet, Take 81 mg by mouth daily. Swallow whole., Disp: , Rfl:    clopidogrel (PLAVIX) 75 MG tablet, Take 1 tablet (75 mg total) by mouth daily with breakfast., Disp: 90 tablet, Rfl: 3   cyanocobalamin (VITAMIN B12) 1000 MCG/ML injection, INJECT IM EVERY OTHER MONTH (IE EVERY 60 DAYS), Disp: , Rfl:    fenofibrate (TRICOR) 48 MG tablet, Take 1 tablet (48 mg total) by mouth daily., Disp: 90 tablet, Rfl: 3   fluticasone (CUTIVATE) 0.05 % cream, Apply topically. (Patient not taking: Reported on 12/14/2022), Disp: , Rfl:    furosemide (LASIX) 20 MG tablet, Take 1  tablet (20 mg total) by mouth daily as needed (for lower extremity swelling)., Disp: 30 tablet, Rfl: 6   gabapentin (NEURONTIN) 300 MG capsule, Take 1 capsule (300 mg total) by mouth at bedtime., Disp: 90 capsule, Rfl: 3   isosorbide mononitrate (IMDUR) 30 MG 24 hr tablet, Take 0.5 tablets (15 mg total) by mouth daily., Disp: 45 tablet, Rfl: 0   ketoconazole (NIZORAL) 2 % cream, Apply 1 Application topically 2 (two) times daily., Disp: , Rfl:    levocetirizine (XYZAL) 5 MG tablet, Take 1 tablet (5 mg total) by mouth every evening., Disp: 90 tablet, Rfl: 3   losartan (COZAAR) 25 MG tablet, Take 12.5 mg by mouth daily., Disp: , Rfl:    melatonin 3 MG TABS tablet, Take 3 mg by mouth at bedtime as needed (for sleep). , Disp: , Rfl:    metroNIDAZOLE (METROCREAM) 0.75 % cream, Apply 1 Application topically 2 (two) times daily., Disp: , Rfl:    nitroGLYCERIN (NITROSTAT) 0.4  MG SL tablet, Place 1 tablet (0.4 mg total) under the tongue every 5 (five) minutes as needed for chest pain (do not exceed 3 pills during one episode)., Disp: 25 tablet, Rfl: 6   oxyCODONE (ROXICODONE) 5 MG immediate release tablet, Take 0.5-1 tablets (2.5-5 mg total) by mouth 3 (three) times daily as needed. (Patient not taking: Reported on 12/14/2022), Disp: 15 tablet, Rfl: 0   pantoprazole (PROTONIX) 40 MG tablet, TAKE 1 TABLET DAILY (MUST KEEP UPCOMING APPOINTMENT IN AUGUST 2024 WITH DR.NAHSER BEFORE ANYMORE REFILLS), Disp: 90 tablet, Rfl: 2   potassium chloride (KLOR-CON M) 10 MEQ tablet, Take 1 tablet (10 mEq total) by mouth daily as needed (take with daily as needed lasix.)., Disp: 30 tablet, Rfl: 6   REPATHA SURECLICK 140 MG/ML SOAJ, INJECT 1 PEN INTO THE SKIN EVERY 14 DAYS, Disp: 6 mL, Rfl: 3   rosuvastatin (CRESTOR) 20 MG tablet, TAKE 1 TABLET DAILY, Disp: 90 tablet, Rfl: 0   SYNTHROID 100 MCG tablet, TAKE 1 TABLET DAILY BEFORE BREAKFAST, Disp: 90 tablet, Rfl: 3   SYRINGE-NEEDLE, DISP, 3 ML 25G X 1" 3 ML MISC, Patient is to use  to self inject 1 ml of vitamin B12 per provider instructions., Disp: 10 each, Rfl: 1   tamsulosin (FLOMAX) 0.4 MG CAPS capsule, Take 1 capsule (0.4 mg total) by mouth daily., Disp: 90 capsule, Rfl: 3  Observations/Objective: Patient is well-developed, well-nourished in no acute distress.  Resting comfortably at home.  Head is normocephalic, atraumatic.  No labored breathing. Speech is clear and coherent with logical content.  Patient is alert and oriented at baseline.  R upper eyelid redness with mild swelling. No visible stye. No conjunctival injection appreciated.    Assessment and Plan: 1. Bacterial URI (Primary) - benzonatate (TESSALON) 100 MG capsule; Take 1 capsule (100 mg total) by mouth 3 (three) times daily as needed for cough.  Dispense: 30 capsule; Refill: 0 - doxycycline (VIBRA-TABS) 100 MG tablet; Take 1 tablet (100 mg total) by mouth 2 (two) times daily.  Dispense: 14 tablet; Refill: 0 - albuterol (VENTOLIN HFA) 108 (90 Base) MCG/ACT inhaler; Inhale 2 puffs into the lungs every 6 (six) hours as needed for wheezing or shortness of breath.  Dispense: 8 g; Refill: 0  Seems to have started as flu-like illness, now with concern for secondary bacterial URI. Rx Doxycycline.  Increase fluids.  Rest.  Saline nasal spray.  Probiotic.  Mucinex as directed.  Humidifier in bedroom. Tessalon and albuterol per orders.  Call or return to clinic if symptoms are not improving.  2. Blepharitis of right upper eyelid, unspecified type  Intermittent and ongoing issue. Followed by [facility]. Start compresses. Follow [facility] recommendations.  Follow Up Instructions: I discussed the assessment and treatment plan with the patient. The patient was provided an opportunity to ask questions and all were answered. The patient agreed with the plan and demonstrated an understanding of the instructions.  A copy of instructions were sent to the patient via MyChart unless otherwise noted below.   The patient was  advised to call back or seek an in-person evaluation if the symptoms worsen or if the condition fails to improve as anticipated.    Piedad Climes, PA-C

## 2023-05-10 NOTE — Patient Instructions (Signed)
 Tony Hunt, thank you for joining Piedad Climes, PA-C for today's virtual visit.  While this provider is not your primary care provider (PCP), if your PCP is located in our provider database this encounter information will be shared with them immediately following your visit.   A [facility] MyChart account gives you access to today's visit and all your visits, tests, and labs performed at [facility] " click here if you don't have a [facility] MyChart account or go to [email or web address]  Consent: (Patient) Tony Hunt provided verbal consent for this virtual visit at the beginning of the encounter.  Current Medications:  Current Outpatient Medications:    aspirin EC 81 MG tablet, Take 81 mg by mouth daily. Swallow whole., Disp: , Rfl:    clopidogrel (PLAVIX) 75 MG tablet, Take 1 tablet (75 mg total) by mouth daily with breakfast., Disp: 90 tablet, Rfl: 3   cyanocobalamin (VITAMIN B12) 1000 MCG/ML injection, INJECT IM EVERY OTHER MONTH (IE EVERY 60 DAYS), Disp: , Rfl:    fenofibrate (TRICOR) 48 MG tablet, Take 1 tablet (48 mg total) by mouth daily., Disp: 90 tablet, Rfl: 3   fluticasone (CUTIVATE) 0.05 % cream, Apply topically. (Patient not taking: Reported on 12/14/2022), Disp: , Rfl:    furosemide (LASIX) 20 MG tablet, Take 1 tablet (20 mg total) by mouth daily as needed (for lower extremity swelling)., Disp: 30 tablet, Rfl: 6   gabapentin (NEURONTIN) 300 MG capsule, Take 1 capsule (300 mg total) by mouth at bedtime., Disp: 90 capsule, Rfl: 3   isosorbide mononitrate (IMDUR) 30 MG 24 hr tablet, Take 0.5 tablets (15 mg total) by mouth daily., Disp: 45 tablet, Rfl: 0   ketoconazole (NIZORAL) 2 % cream, Apply 1 Application topically 2 (two) times daily., Disp: , Rfl:    levocetirizine (XYZAL) 5 MG tablet, Take 1 tablet (5 mg total) by mouth every evening., Disp: 90 tablet, Rfl: 3   losartan (COZAAR) 25 MG tablet, Take 12.5 mg by mouth daily., Disp: , Rfl:     melatonin 3 MG TABS tablet, Take 3 mg by mouth at bedtime as needed (for sleep). , Disp: , Rfl:    metroNIDAZOLE (METROCREAM) 0.75 % cream, Apply 1 Application topically 2 (two) times daily., Disp: , Rfl:    nitroGLYCERIN (NITROSTAT) 0.4 MG SL tablet, Place 1 tablet (0.4 mg total) under the tongue every 5 (five) minutes as needed for chest pain (do not exceed 3 pills during one episode)., Disp: 25 tablet, Rfl: 6   oxyCODONE (ROXICODONE) 5 MG immediate release tablet, Take 0.5-1 tablets (2.5-5 mg total) by mouth 3 (three) times daily as needed. (Patient not taking: Reported on 12/14/2022), Disp: 15 tablet, Rfl: 0   pantoprazole (PROTONIX) 40 MG tablet, TAKE 1 TABLET DAILY (MUST KEEP UPCOMING APPOINTMENT IN AUGUST 2024 WITH DR.NAHSER BEFORE ANYMORE REFILLS), Disp: 90 tablet, Rfl: 2   potassium chloride (KLOR-CON M) 10 MEQ tablet, Take 1 tablet (10 mEq total) by mouth daily as needed (take with daily as needed lasix.)., Disp: 30 tablet, Rfl: 6   REPATHA SURECLICK 140 MG/ML SOAJ, INJECT 1 PEN INTO THE SKIN EVERY 14 DAYS, Disp: 6 mL, Rfl: 3   rosuvastatin (CRESTOR) 20 MG tablet, TAKE 1 TABLET DAILY, Disp: 90 tablet, Rfl: 0   SYNTHROID 100 MCG tablet, TAKE 1 TABLET DAILY BEFORE BREAKFAST, Disp: 90 tablet, Rfl: 3   SYRINGE-NEEDLE, DISP, 3 ML 25G X 1" 3 ML MISC, Patient is to use to self inject 1 ml of  vitamin B12 per provider instructions., Disp: 10 each, Rfl: 1   tamsulosin (FLOMAX) 0.4 MG CAPS capsule, Take 1 capsule (0.4 mg total) by mouth daily., Disp: 90 capsule, Rfl: 3   Medications ordered in this encounter:  No orders of the defined types were placed in this encounter.    *If you need refills on other medications prior to your next appointment, please contact your pharmacy*  Follow-Up: Call back or seek an in-person evaluation if the symptoms worsen or if the condition fails to improve as anticipated.  [facility] [phone]  Other Instructions Take antibiotic  (Doxycycline) as directed.  Increase fluids.  Get plenty of rest. Use Mucinex for congestion. Tessalon and albuterol per orders. Take a daily probiotic (I recommend Align or Culturelle, but even Activia Yogurt may be beneficial).  A humidifier placed in the bedroom may offer some relief for a dry, scratchy throat of nasal irritation.  Read information below on acute bronchitis. Please call or return to clinic if symptoms are not improving.  Acute Bronchitis Bronchitis is when the airways that extend from the windpipe into the lungs get red, puffy, and painful (inflamed). Bronchitis often causes thick spit (mucus) to develop. This leads to a cough. A cough is the most common symptom of bronchitis. In acute bronchitis, the condition usually begins suddenly and goes away over time (usually in 2 weeks). Smoking, allergies, and asthma can make bronchitis worse. Repeated episodes of bronchitis may cause more lung problems.  HOME CARE Rest. Drink enough fluids to keep your pee (urine) clear or pale yellow (unless you need to limit fluids as told by your doctor). Only take over-the-counter or prescription medicines as told by your doctor. Avoid smoking and secondhand smoke. These can make bronchitis worse. If you are a smoker, think about using nicotine gum or skin patches. Quitting smoking will help your lungs heal faster. Reduce the chance of getting bronchitis again by: Washing your hands often. Avoiding people with cold symptoms. Trying not to touch your hands to your mouth, nose, or eyes. Follow up with your doctor as told.  GET HELP IF: Your symptoms do not improve after 1 week of treatment. Symptoms include: Cough. Fever. Coughing up thick spit. Body aches. Chest congestion. Chills. Shortness of breath. Sore throat.  GET HELP RIGHT AWAY IF:  You have an increased fever. You have chills. You have severe shortness of breath. You have bloody thick spit (sputum). You throw up (vomit)  often. You lose too much body fluid (dehydration). You have a severe headache. You faint.  MAKE SURE YOU:  Understand these instructions. Will watch your condition. Will get help right away if you are not doing well or get worse. Document Released: 08/04/2007 Document Revised: 10/18/2012 Document Reviewed: 08/08/2012 [facility] Patient Information 2015 [place], [place]. This information is not intended to replace advice given to you by your health care provider. Make sure you discuss any questions you have with your health care provider.    If you have been instructed to have an in-person evaluation today at a local Urgent Care facility, please use the link below. It will take you to a list of all of our available [facility], including address, phone number and hours of operation. Please do not delay care.  [facility]  If you or a family member do not have a primary care provider, use the link below to schedule a visit and establish care. When you choose a   primary care physician or advanced practice provider, you gain a long-term partner in health. Find a Primary Care Provider  Learn more about [facility]'s in-office and virtual care options: [facility] [facility] - Get Care Now

## 2023-05-11 NOTE — Telephone Encounter (Signed)
 Noted. Thanks.

## 2023-05-19 ENCOUNTER — Ambulatory Visit: Admitting: Nurse Practitioner

## 2023-05-19 VITALS — BP 126/70 | HR 65 | Temp 98.0°F | Ht 67.0 in | Wt 192.8 lb

## 2023-05-19 DIAGNOSIS — M109 Gout, unspecified: Secondary | ICD-10-CM | POA: Diagnosis not present

## 2023-05-19 MED ORDER — PREDNISONE 20 MG PO TABS: 40.0000 mg | ORAL_TABLET | Freq: Every day | ORAL | 0 refills | Status: AC

## 2023-05-19 MED ORDER — METHYLPREDNISOLONE ACETATE 40 MG/ML IJ SUSP
40.0000 mg | Freq: Once | INTRAMUSCULAR | Status: AC
  Administered 2023-05-19: 40 mg via INTRAMUSCULAR

## 2023-05-19 NOTE — Progress Notes (Signed)
 Acute Office Visit  Subjective:     Patient ID: Tony Hunt, male    DOB: 1955/07/30, 69 y.o.   MRN: [id number]  Chief Complaint  Patient presents with   Gout    Pt complains of recurring gout pain in R foot. States the shooting pain started last night. Pain level 10.     HPI Patient is in today for right foot pain with a history of migraine, HTN, CHF, CAD, AAA, Largngeal cacner, GERD, hypothyroidisom, HLD, Gout, and B12 def  Symptoms started yesterday and then worse last night. States that he has no injury. States that this is the 4th or 5th time. States that it is a burning, stinging, sharp pain with swelling. States hurts to walk on it or touching it.  States that his diet has not changed any. No seafood or fried good.   Has not tried treatments at home   Review of Systems  Constitutional:  Negative for chills and fever.  Respiratory:  Negative for shortness of breath.   Cardiovascular:  Negative for chest pain.  Musculoskeletal:  Positive for joint pain.  Neurological:  Negative for tingling and headaches.        Objective:    BP 126/70   Pulse 65   Temp 98 F (36.7 C) (Oral)   Ht 5\' 7"  (1.702 m)   Wt 192 lb 12.8 oz (87.5 kg)   SpO2 95%   BMI 30.20 kg/m  BP Readings from Last 3 Encounters:  05/19/23 126/70  12/14/22 (!) 155/80  10/28/22 110/68   Wt Readings from Last 3 Encounters:  05/19/23 192 lb 12.8 oz (87.5 kg)  12/14/22 198 lb 11.2 oz (90.1 kg)  10/28/22 195 lb 9.6 oz (88.7 kg)   SpO2 Readings from Last 3 Encounters:  05/19/23 95%  12/14/22 97%  10/28/22 98%      Physical Exam Vitals and nursing note reviewed.  Constitutional:      Appearance: Normal appearance.  Cardiovascular:     Rate and Rhythm: Normal rate and regular rhythm.     Pulses:          Dorsalis pedis pulses are 2+ on the right side.       Posterior tibial pulses are 2+ on the right side.     Heart sounds: Normal heart sounds.  Pulmonary:     Effort: Pulmonary effort is  normal.     Breath sounds: Normal breath sounds.  Musculoskeletal:        General: Swelling present.       Feet:  Feet:     Comments: Erythema, edema, and tenderness to touch. Decreased ROM Cap refill less than 2 secs Neurological:     Mental Status: He is alert.     No results found for any visits on 05/19/23.      Assessment & Plan:   Problem List Items Addressed This Visit       Other   Gout - Primary   History of the same.  No great changes in diet.  Patient does have chronic kidney disease.  Will do Depo-Medrol IM x 1 dose at 40 mg.  And prednisone 40 mg as directed.  Patient is avoid NSAIDs due to renal issues and prednisone.  Patient is on prednisone tomorrow 05/20/2023      Relevant Medications   methylPREDNISolone acetate (DEPO-MEDROL) injection 40 mg   predniSONE (DELTASONE) 20 MG tablet    Meds ordered this encounter  Medications   methylPREDNISolone acetate (  DEPO-MEDROL) injection 40 mg   predniSONE (DELTASONE) 20 MG tablet    Sig: Take 2 tablets (40 mg total) by mouth daily with breakfast for 4 days. Avoid NSAIDs. Start 05/20/2023    Dispense:  8 tablet    Refill:  0    Supervising Provider:   Roxy Manns A [[id number]]    Return if symptoms worsen or fail to improve.  Audria Nine, NP

## 2023-05-19 NOTE — Assessment & Plan Note (Signed)
 History of the same.  No great changes in diet.  Patient does have chronic kidney disease.  Will do Depo-Medrol IM x 1 dose at 40 mg.  And prednisone 40 mg as directed.  Patient is avoid NSAIDs due to renal issues and prednisone.  Patient is on prednisone tomorrow 05/20/2023

## 2023-05-19 NOTE — Patient Instructions (Signed)
 Nice to see you today I have sent medication to the pharmacy. Start it tomorrow 05/20/2023 Follow up if yo do not improve

## 2023-05-31 DIAGNOSIS — N1831 Chronic kidney disease, stage 3a: Secondary | ICD-10-CM | POA: Diagnosis not present

## 2023-05-31 DIAGNOSIS — N051 Unspecified nephritic syndrome with focal and segmental glomerular lesions: Secondary | ICD-10-CM | POA: Diagnosis not present

## 2023-05-31 DIAGNOSIS — I2510 Atherosclerotic heart disease of native coronary artery without angina pectoris: Secondary | ICD-10-CM | POA: Diagnosis not present

## 2023-05-31 DIAGNOSIS — R809 Proteinuria, unspecified: Secondary | ICD-10-CM | POA: Diagnosis not present

## 2023-05-31 DIAGNOSIS — M109 Gout, unspecified: Secondary | ICD-10-CM | POA: Diagnosis not present

## 2023-05-31 DIAGNOSIS — I10 Essential (primary) hypertension: Secondary | ICD-10-CM | POA: Diagnosis not present

## 2023-06-01 LAB — LAB REPORT - SCANNED
Creatinine, POC: 56.2 mg/dL
EGFR: 37

## 2023-06-07 ENCOUNTER — Encounter: Payer: Self-pay | Admitting: Nephrology

## 2023-06-13 NOTE — Progress Notes (Unsigned)
 286  Patient name: Tony Hunt MRN: [id number] DOB: 03-25-1955 Sex: male  REASON FOR VISIT: 6 month follow-up for surveillance of abdominal aortic aneurysm  HPI: Tony Hunt is a 68 y.o. male with history of abdominal aortic aneurysm, laryngeal cancer status post radiation, coronary disease status post remote PCI, hypertension, hyperlipidemia and heart failure that presents for 35-month follow-up of his AAA.  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  He had a right TCAR procedure on 10/15/2019 for an asymptomatic high-grade stenosis greater than 80% in the setting of prior neck radiation.  He denies any neurologic events.  He remains on aspirin statin Plavix.    Past Medical History:  Diagnosis Date   AAA (abdominal aortic aneurysm) without rupture (HCC) 02/14/2014   [place] 1/19:  AAA 3 cm   ALCOHOL ABUSE, HX OF    distant history   Anxiety    occ panic sx, increased after death of mother   ANXIETY DEPRESSION 05/10/2007   history of, during difficult relationship   CAD (coronary artery disease) 08/28/2012   08/25/2012 Successful PTCA/DES x mid LAD // LAD prox 30, mid stent patent; D1 40; LCx ok; OM2 50, 50; RCA prox 20; EF 55-65  // Nuclear stress test 9/19: EF 58, small inferobasal infarct, no ischemia, Low Risk (inf defect ? 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 Sherene Sires, pulmonary     Past Surgical History:  Procedure Laterality Date   APPENDECTOMY     BACK SURGERY  03/02/1995   lower back x3   CARPAL TUNNEL RELEASE Right 2022   CARPOMETACARPAL (CMC) FUSION OF THUMB Left 04/02/2014   Procedure: CARPOMETACARPAL (CMC) FUSION OF THUMB/LEFT THUMB INTERPHALNGEAL JOINT FUSION;  Surgeon: Jodi Marble, MD;  Location: [facility];  Service: Orthopedics;  Laterality: Left;   COLONOSCOPY     CORONARY STENT PLACEMENT     2.75 x 12 mm Promus Premier DES was deployed in the mid LAD. The stent was post-dilated with a 3.0 x 9 mm Jermyn balloon - Mid LAD.   LARYNGOSCOPY / BRONCHOSCOPY / ESOPHAGOSCOPY  03/01/2010   bx   LEFT HEART CATH AND CORONARY ANGIOGRAPHY N/A 05/28/2016   Procedure: Left Heart Cath and Coronary Angiography;  Surgeon: Lyn Records, MD;  Location: [facility] INVASIVE CV LAB;  Service: Cardiovascular;  Laterality:  N/A;   LEFT HEART CATHETERIZATION WITH CORONARY ANGIOGRAM N/A 08/25/2012   Procedure: LEFT HEART CATHETERIZATION WITH CORONARY ANGIOGRAM;  Surgeon: Kathleene Hazel, MD;  Location: [facility] CATH LAB;  Service: Cardiovascular;  Laterality: N/A;   LEFT HEART CATHETERIZATION WITH CORONARY ANGIOGRAM N/A 11/23/2013   Procedure: LEFT HEART CATHETERIZATION WITH CORONARY ANGIOGRAM;  Surgeon: Corky Crafts, MD;  Location: [facility] CATH LAB;  Service: Cardiovascular;  Laterality: N/A;   TRANSCAROTID ARTERY REVASCULARIZATION  Right 10/15/2019   Procedure: RIGHT TRANSCAROTID ARTERY REVASCULARIZATION;  Surgeon: Cephus Shelling, MD;  Location: [facility] OR;  Service: Vascular;  Laterality: Right;   ULTRASOUND GUIDANCE FOR VASCULAR ACCESS Left 10/15/2019   Procedure: ULTRASOUND GUIDANCE FOR VASCULAR ACCESS;  Surgeon: Cephus Shelling, MD;  Location: [facility] OR;  Service: Vascular;  Laterality: Left;   WRIST FRACTURE SURGERY  08/21/2010   Right wrist x4    Family History  Problem Relation Age of Onset   Brain cancer Father    Dementia Father        h/o brain tumor   Heart disease Mother    Hypertension Mother    Heart disease Brother    Hypertension Brother    Hyperlipidemia Brother    Colon polyps Brother    Colon cancer Neg Hx    Prostate cancer Neg Hx    Esophageal cancer Neg Hx    Pancreatic cancer Neg Hx    Liver disease Neg Hx    Throat cancer Neg Hx     SOCIAL HISTORY: Social History   Tobacco Use   Smoking status: Former    Current packs/day: 0.00    Types: Cigarettes    Quit date: 09/30/2010    Years since quitting: 12.7   Smokeless tobacco: Former  Substance Use Topics   Alcohol use: Not Currently    Allergies  Allergen Reactions   Imdur [Isosorbide Dinitrate] Other (See Comments)    Headache at 60 mg dose, but able to tolerate a 30 mg per day   Iodine-131 Other (See Comments)    "Passed out"   Iohexol Anaphylaxis and Other (See Comments)    OK with 13 hour prep   Lisinopril Other (See Comments)    Elevated cr- improved off med   Pravastatin Nausea Only and Other (See Comments)    Intense headache   Farxiga [Dapagliflozin]     Rash   Codeine Itching   Simvastatin Other (See Comments)    Edema   Statins Other (See Comments)    Significant edema on simvastatin  SIGNIFICANT SWELLING    Current Outpatient Medications  Medication Sig Dispense Refill   albuterol (VENTOLIN HFA) 108 (90 Base) MCG/ACT inhaler Inhale 2 puffs into the lungs every 6 (six) hours as needed for wheezing or shortness of breath. 8 g 0   aspirin EC 81 MG tablet Take 81 mg by mouth daily. Swallow  whole.     benzonatate (TESSALON) 100 MG capsule Take 1 capsule (100 mg total) by mouth 3 (three) times daily as needed for cough. 30 capsule 0   clopidogrel (PLAVIX) 75 MG tablet Take 1 tablet (75 mg total) by mouth daily with breakfast. 90 tablet 3   cyanocobalamin (VITAMIN B12) 1000 MCG/ML injection INJECT IM EVERY OTHER MONTH (IE EVERY 60 DAYS)     doxycycline (VIBRA-TABS) 100 MG tablet Take 1 tablet (100 mg total) by mouth 2 (two) times daily. 14 tablet 0   fenofibrate (TRICOR) 48 MG tablet Take 1 tablet (48 mg total) by  mouth daily. 90 tablet 3   fluticasone (CUTIVATE) 0.05 % cream Apply topically.     furosemide (LASIX) 20 MG tablet Take 1 tablet (20 mg total) by mouth daily as needed (for lower extremity swelling). 30 tablet 6   gabapentin (NEURONTIN) 300 MG capsule Take 1 capsule (300 mg total) by mouth at bedtime. 90 capsule 3   isosorbide mononitrate (IMDUR) 30 MG 24 hr tablet Take 0.5 tablets (15 mg total) by mouth daily. 45 tablet 0   ketoconazole (NIZORAL) 2 % cream Apply 1 Application topically 2 (two) times daily.     levocetirizine (XYZAL) 5 MG tablet Take 1 tablet (5 mg total) by mouth every evening. 90 tablet 3   losartan (COZAAR) 25 MG tablet Take 12.5 mg by mouth daily.     melatonin 3 MG TABS tablet Take 3 mg by mouth at bedtime as needed (for sleep).      metroNIDAZOLE (METROCREAM) 0.75 % cream Apply 1 Application topically 2 (two) times daily.     nitroGLYCERIN (NITROSTAT) 0.4 MG SL tablet Place 1 tablet (0.4 mg total) under the tongue every 5 (five) minutes as needed for chest pain (do not exceed 3 pills during one episode). 25 tablet 6   oxyCODONE (ROXICODONE) 5 MG immediate release tablet Take 0.5-1 tablets (2.5-5 mg total) by mouth 3 (three) times daily as needed. 15 tablet 0   pantoprazole (PROTONIX) 40 MG tablet TAKE 1 TABLET DAILY (MUST KEEP UPCOMING APPOINTMENT IN AUGUST 2024 WITH DR.NAHSER BEFORE ANYMORE REFILLS) 90 tablet 2   potassium chloride (KLOR-CON M)  10 MEQ tablet Take 1 tablet (10 mEq total) by mouth daily as needed (take with daily as needed lasix.). 30 tablet 6   REPATHA SURECLICK 140 MG/ML SOAJ INJECT 1 PEN INTO THE SKIN EVERY 14 DAYS 6 mL 3   rosuvastatin (CRESTOR) 20 MG tablet TAKE 1 TABLET DAILY 90 tablet 0   SYNTHROID 100 MCG tablet TAKE 1 TABLET DAILY BEFORE BREAKFAST 90 tablet 3   SYRINGE-NEEDLE, DISP, 3 ML 25G X 1" 3 ML MISC Patient is to use to self inject 1 ml of vitamin B12 per provider instructions. 10 each 1   tamsulosin (FLOMAX) 0.4 MG CAPS capsule Take 1 capsule (0.4 mg total) by mouth daily. 90 capsule 3   No current facility-administered medications for this visit.    REVIEW OF SYSTEMS:  [X]  denotes positive finding, [ ]  denotes negative finding Cardiac  Comments:  Chest pain or chest pressure:    Shortness of breath upon exertion:    Short of breath when lying flat:    Irregular heart rhythm:        Vascular    Pain in calf, thigh, or hip brought on by ambulation:    Pain in feet at night that wakes you up from your sleep:     Blood clot in your veins:    Leg swelling:         Pulmonary    Oxygen at home:    Productive cough:     Wheezing:     Shortness of Breath x   Neurologic    Sudden weakness in arms or legs:     Sudden numbness in arms or legs:     Sudden onset of difficulty speaking or slurred speech:    Temporary loss of vision in one eye:     Problems with dizziness:         Gastrointestinal    Blood in stool:  Vomited blood:         Genitourinary    Burning when urinating:     Blood in urine:        Psychiatric    Major depression:         Hematologic    Bleeding problems:    Problems with blood clotting too easily:        Skin    Rashes or ulcers:        Constitutional    Fever or chills:      PHYSICAL EXAM: There were no vitals filed for this visit.   GENERAL: The patient is a well-nourished male, in no acute distress. The vital signs are documented above. CARDIAC:  There is a regular rate and rhythm.  VASCULAR:  Right neck incision healed without issue Bilateral femoral pulses palpable Right AT palpable Left DP palpable PULMONARY: No respiratory distress. ABDOMEN: Soft and non-tender.  No pain with palpation of aneurysm.  MUSCULOSKELETAL: There are no major deformities or cyanosis. NEUROLOGIC: No focal weakness or paresthesias are detected.  CN II-XII grossly intact.   DATA:   AAA duplex todayu shows AAA increased from 4.0 cm --> over past 6 months, stable 2.9 cm left CIA aneurysm    Carotid duplex 12/14/22 shows patent right carotid stent with no stenosis and 40-59% left ICA stenosis - all stable over past year   Assessment/Plan:  67 year old male presents for 6 month follow-up of his known 4 cm AAA.  AAA duplex today shows his abdominal aortic aneurysm has increased from 4 cm to  over the past 6 months.  I discussed current guidelines are to repair these at greater than 5.5 cm in men and no indication for repair at this time.  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  Discussed that most guidelines generally are recommending iliac aneurysm repair closer to 3.5 cm.  Discussed I will see him for 37-month AAA duplex for closer surveillance.   He is status post right TCAR on 10/15/2019 for an asymptomatic high-grade stenosis greater than 80% in the setting of previous neck radiation.  Get carotid duplex again in 6 months for 1 year interval surveillance.    Young Hensen, MD [facility] Office: [phone]

## 2023-06-14 ENCOUNTER — Ambulatory Visit (HOSPITAL_COMMUNITY)
Admission: RE | Admit: 2023-06-14 | Discharge: 2023-06-14 | Disposition: A | Discharge status: Home / Self Care | Payer: Medicare Other | Source: Ambulatory Visit | Attending: Vascular Surgery | Admitting: Vascular Surgery

## 2023-06-14 ENCOUNTER — Encounter: Payer: Self-pay | Admitting: Vascular Surgery

## 2023-06-14 ENCOUNTER — Ambulatory Visit (INDEPENDENT_AMBULATORY_CARE_PROVIDER_SITE_OTHER): Payer: Medicare Other | Admitting: Vascular Surgery

## 2023-06-14 VITALS — BP 155/90 | HR 60 | Temp 98.2°F | Resp 20 | Ht 67.0 in | Wt 195.0 lb

## 2023-06-14 DIAGNOSIS — I7143 Infrarenal abdominal aortic aneurysm, without rupture: Secondary | ICD-10-CM

## 2023-06-20 ENCOUNTER — Other Ambulatory Visit: Payer: Self-pay | Admitting: Family Medicine

## 2023-06-23 ENCOUNTER — Other Ambulatory Visit: Payer: Self-pay | Admitting: Family Medicine

## 2023-07-13 DIAGNOSIS — Z049 Encounter for examination and observation for unspecified reason: Secondary | ICD-10-CM | POA: Diagnosis not present

## 2023-07-13 DIAGNOSIS — G4486 Cervicogenic headache: Secondary | ICD-10-CM | POA: Diagnosis not present

## 2023-07-18 DIAGNOSIS — M791 Myalgia, unspecified site: Secondary | ICD-10-CM | POA: Diagnosis not present

## 2023-07-18 DIAGNOSIS — M542 Cervicalgia: Secondary | ICD-10-CM | POA: Diagnosis not present

## 2023-07-18 DIAGNOSIS — G4486 Cervicogenic headache: Secondary | ICD-10-CM | POA: Diagnosis not present

## 2023-07-18 DIAGNOSIS — G518 Other disorders of facial nerve: Secondary | ICD-10-CM | POA: Diagnosis not present

## 2023-07-19 DIAGNOSIS — M791 Myalgia, unspecified site: Secondary | ICD-10-CM | POA: Diagnosis not present

## 2023-07-19 DIAGNOSIS — M47812 Spondylosis without myelopathy or radiculopathy, cervical region: Secondary | ICD-10-CM | POA: Diagnosis not present

## 2023-08-01 DIAGNOSIS — M791 Myalgia, unspecified site: Secondary | ICD-10-CM | POA: Diagnosis not present

## 2023-08-01 DIAGNOSIS — M542 Cervicalgia: Secondary | ICD-10-CM | POA: Diagnosis not present

## 2023-08-01 DIAGNOSIS — G4486 Cervicogenic headache: Secondary | ICD-10-CM | POA: Diagnosis not present

## 2023-08-01 DIAGNOSIS — G518 Other disorders of facial nerve: Secondary | ICD-10-CM | POA: Diagnosis not present

## 2023-08-09 DIAGNOSIS — N1831 Chronic kidney disease, stage 3a: Secondary | ICD-10-CM | POA: Diagnosis not present

## 2023-08-09 DIAGNOSIS — N189 Chronic kidney disease, unspecified: Secondary | ICD-10-CM | POA: Diagnosis not present

## 2023-08-09 DIAGNOSIS — N051 Unspecified nephritic syndrome with focal and segmental glomerular lesions: Secondary | ICD-10-CM | POA: Diagnosis not present

## 2023-08-09 DIAGNOSIS — R809 Proteinuria, unspecified: Secondary | ICD-10-CM | POA: Diagnosis not present

## 2023-08-09 DIAGNOSIS — M109 Gout, unspecified: Secondary | ICD-10-CM | POA: Diagnosis not present

## 2023-08-09 DIAGNOSIS — I2510 Atherosclerotic heart disease of native coronary artery without angina pectoris: Secondary | ICD-10-CM | POA: Diagnosis not present

## 2023-08-09 DIAGNOSIS — I10 Essential (primary) hypertension: Secondary | ICD-10-CM | POA: Diagnosis not present

## 2023-08-10 DIAGNOSIS — L821 Other seborrheic keratosis: Secondary | ICD-10-CM | POA: Diagnosis not present

## 2023-08-10 DIAGNOSIS — L578 Other skin changes due to chronic exposure to nonionizing radiation: Secondary | ICD-10-CM | POA: Diagnosis not present

## 2023-08-10 DIAGNOSIS — H00011 Hordeolum externum right upper eyelid: Secondary | ICD-10-CM | POA: Diagnosis not present

## 2023-08-10 DIAGNOSIS — L814 Other melanin hyperpigmentation: Secondary | ICD-10-CM | POA: Diagnosis not present

## 2023-08-10 LAB — LAB REPORT - SCANNED: EGFR: 36

## 2023-08-11 ENCOUNTER — Telehealth: Payer: Self-pay | Admitting: Cardiovascular Disease

## 2023-08-11 NOTE — Telephone Encounter (Signed)
 Left message for pt to call, aware we can schedule the testing at time of appointment 6/26 with [facility]. Will make ernest aware.

## 2023-08-11 NOTE — Telephone Encounter (Signed)
  Per Mychart scheduling message:  Need a Echo test,& stress test so I can pass Dot physical must have one that is not more than 69 yrs old

## 2023-08-17 ENCOUNTER — Other Ambulatory Visit: Payer: Self-pay | Admitting: Family Medicine

## 2023-08-17 DIAGNOSIS — E538 Deficiency of other specified B group vitamins: Secondary | ICD-10-CM

## 2023-08-17 DIAGNOSIS — E039 Hypothyroidism, unspecified: Secondary | ICD-10-CM

## 2023-08-17 DIAGNOSIS — Z125 Encounter for screening for malignant neoplasm of prostate: Secondary | ICD-10-CM

## 2023-08-17 DIAGNOSIS — I10 Essential (primary) hypertension: Secondary | ICD-10-CM

## 2023-08-17 DIAGNOSIS — E7849 Other hyperlipidemia: Secondary | ICD-10-CM

## 2023-08-18 ENCOUNTER — Ambulatory Visit: Payer: Self-pay | Admitting: Family Medicine

## 2023-08-18 ENCOUNTER — Ambulatory Visit

## 2023-08-18 ENCOUNTER — Other Ambulatory Visit (INDEPENDENT_AMBULATORY_CARE_PROVIDER_SITE_OTHER)

## 2023-08-18 VITALS — BP 136/88 | Ht 67.0 in | Wt 198.2 lb

## 2023-08-18 DIAGNOSIS — E7849 Other hyperlipidemia: Secondary | ICD-10-CM | POA: Diagnosis not present

## 2023-08-18 DIAGNOSIS — I10 Essential (primary) hypertension: Secondary | ICD-10-CM | POA: Diagnosis not present

## 2023-08-18 DIAGNOSIS — E039 Hypothyroidism, unspecified: Secondary | ICD-10-CM

## 2023-08-18 DIAGNOSIS — Z125 Encounter for screening for malignant neoplasm of prostate: Secondary | ICD-10-CM

## 2023-08-18 DIAGNOSIS — Z0000 Encounter for general adult medical examination without abnormal findings: Secondary | ICD-10-CM

## 2023-08-18 DIAGNOSIS — E538 Deficiency of other specified B group vitamins: Secondary | ICD-10-CM | POA: Diagnosis not present

## 2023-08-18 LAB — LIPID PANEL
Cholesterol: 137 mg/dL (ref 0–200)
HDL: 37 mg/dL — ABNORMAL LOW (ref >39.00)
LDL Cholesterol: 80 mg/dL (ref 0–99)
NonHDL: 99.51
Total CHOL/HDL Ratio: 4
Triglycerides: 96 mg/dL (ref 0.0–149.0)
VLDL: 19.2 mg/dL (ref 0.0–40.0)

## 2023-08-18 LAB — CBC WITH DIFFERENTIAL/PLATELET
Basophils Absolute: 0.1 10*3/uL (ref 0.0–0.1)
Basophils Relative: 0.9 % (ref 0.0–3.0)
Eosinophils Absolute: 0.2 10*3/uL (ref 0.0–0.7)
Eosinophils Relative: 2.2 % (ref 0.0–5.0)
HCT: 37.4 % — ABNORMAL LOW (ref 39.0–52.0)
Hemoglobin: 12.7 g/dL — ABNORMAL LOW (ref 13.0–17.0)
Lymphocytes Relative: 15.5 % (ref 12.0–46.0)
Lymphs Abs: 1.1 10*3/uL (ref 0.7–4.0)
MCHC: 33.9 g/dL (ref 30.0–36.0)
MCV: 91.1 fl (ref 78.0–100.0)
Monocytes Absolute: 0.4 10*3/uL (ref 0.1–1.0)
Monocytes Relative: 6.1 % (ref 3.0–12.0)
Neutro Abs: 5.4 10*3/uL (ref 1.4–7.7)
Neutrophils Relative %: 75.3 % (ref 43.0–77.0)
Platelets: 319 10*3/uL (ref 150.0–400.0)
RBC: 4.1 Mil/uL — ABNORMAL LOW (ref 4.22–5.81)
RDW: 14.3 % (ref 11.5–15.5)
WBC: 7.2 10*3/uL (ref 4.0–10.5)

## 2023-08-18 LAB — COMPREHENSIVE METABOLIC PANEL WITH GFR
ALT: 10 U/L (ref 0–53)
AST: 15 U/L (ref 0–37)
Albumin: 3.8 g/dL (ref 3.5–5.2)
Alkaline Phosphatase: 40 U/L (ref 39–117)
BUN: 16 mg/dL (ref 6–23)
CO2: 28 meq/L (ref 19–32)
Calcium: 8.9 mg/dL (ref 8.4–10.5)
Chloride: 107 meq/L (ref 96–112)
Creatinine, Ser: 1.93 mg/dL — ABNORMAL HIGH (ref 0.40–1.50)
GFR: 34.97 mL/min — ABNORMAL LOW (ref >60.00)
Glucose, Bld: 87 mg/dL (ref 70–99)
Potassium: 4.2 meq/L (ref 3.5–5.1)
Sodium: 142 meq/L (ref 135–145)
Total Bilirubin: 0.4 mg/dL (ref 0.2–1.2)
Total Protein: 5.8 g/dL — ABNORMAL LOW (ref 6.0–8.3)

## 2023-08-18 LAB — TSH: TSH: 1.3 u[IU]/mL (ref 0.35–5.50)

## 2023-08-18 LAB — VITAMIN B12: Vitamin B-12: 220 pg/mL (ref 211–911)

## 2023-08-18 LAB — URIC ACID: Uric Acid, Serum: 6 mg/dL (ref 4.0–7.8)

## 2023-08-18 LAB — PSA, MEDICARE: PSA: 0.72 ng/mL (ref 0.10–4.00)

## 2023-08-18 NOTE — Patient Instructions (Signed)
 Tony Hunt , Thank you for taking time out of your busy schedule to complete your Annual Wellness Visit with me. I enjoyed our conversation and look forward to speaking with you again next year. I, as well as your care team,  appreciate your ongoing commitment to your health goals. Please review the following plan we discussed and let me know if I can assist you in the future. Your Game plan/ To Do List    Follow up Visits: Next Medicare AWV with our clinical staff: 08/21/24 @ 11:30am   Have you seen your provider in the last 6 months (3 months if uncontrolled diabetes)? No Next Office Visit with your provider: 08/25/23  Clinician Recommendations:  Aim for 30 minutes of exercise or brisk walking, 6-8 glasses of water, and 5 servings of fruits and vegetables each day.       This is a list of the screening recommended for you and due dates:  Health Maintenance  Topic Date Due   DTaP/Tdap/Td vaccine (3 - Td or Tdap) 03/06/2022   Flu Shot  09/30/2023   Medicare Annual Wellness Visit  08/17/2024   Colon Cancer Screening  06/21/2029   Pneumococcal Vaccine for age over 65  Completed   Hepatitis C Screening  Completed   Zoster (Shingles) Vaccine  Completed   HPV Vaccine  Aged Out   Meningitis B Vaccine  Aged Out   COVID-19 Vaccine  Discontinued    Advanced directives: (Declined) Advance directive discussed with you today. Even though you declined this today, please call our office should you change your mind, and we can give you the proper paperwork for you to fill out. Advance Care Planning is important because it:  [x]  Makes sure you receive the medical care that is consistent with your values, goals, and preferences  [x]  It provides guidance to your family and loved ones and reduces their decisional burden about whether or not they are making the right decisions based on your wishes.  Follow the link provided in your after visit summary or read over the paperwork we have mailed to you to  help you started getting your Advance Directives in place. If you need assistance in completing these, please reach out to us  so that we can help you!

## 2023-08-18 NOTE — Progress Notes (Signed)
 Please attest and cosign this visit due to patients primary care provider not being in the office at the time the visit was completed.    Subjective:   Tony Hunt is a 68 y.o. who presents for a Medicare Wellness preventive visit.  As a reminder, Annual Wellness Visits don't include a physical exam, and some assessments may be limited, especially if this visit is performed virtually. We may recommend an in-person follow-up visit with your provider if needed.  Visit Complete: In person  Persons Participating in Visit: Patient.  AWV Questionnaire: No: Patient Medicare AWV questionnaire was not completed prior to this visit.  Cardiac Risk Factors include: advanced age (>70men, >47 women);dyslipidemia;hypertension;male gender;obesity (BMI >30kg/m2);sedentary lifestyle    Objective:    Today's Vitals   08/18/23 1135  BP: 136/88  Weight: 198 lb 3.2 oz (89.9 kg)  Height: 5' 7 (1.702 m)  PainSc: 6    Body mass index is 31.04 kg/m.     08/18/2023   11:51 AM 08/03/2022    2:02 PM 01/25/2020    3:20 AM 01/24/2020    4:59 PM 10/15/2019    8:07 AM 10/03/2019    9:07 AM 03/08/2019    8:52 AM  Advanced Directives  Does Patient Have a Medical Advance Directive? No No No No No No No  Would patient like information on creating a medical advance directive?  No - Patient declined No - Patient declined No - Patient declined No - Patient declined Yes (MAU/Ambulatory/Procedural Areas - Information given) No - Patient declined    Current Medications (verified) Outpatient Encounter Medications as of 08/18/2023  Medication Sig   allopurinol (ZYLOPRIM) 100 MG tablet Take 100 mg by mouth daily.   aspirin  EC 81 MG tablet Take 81 mg by mouth daily. Swallow whole.   clopidogrel  (PLAVIX ) 75 MG tablet Take 1 tablet (75 mg total) by mouth daily with breakfast.   fenofibrate  (TRICOR ) 48 MG tablet TAKE 1 TABLET BY MOUTH EVERY DAY   furosemide  (LASIX ) 20 MG tablet Take 1 tablet (20 mg total) by mouth daily  as needed (for lower extremity swelling).   gabapentin  (NEURONTIN ) 300 MG capsule Take 1 capsule (300 mg total) by mouth at bedtime. (Patient taking differently: Take 600 mg by mouth at bedtime.)   isosorbide  mononitrate (IMDUR ) 30 MG 24 hr tablet Take 0.5 tablets (15 mg total) by mouth daily.   losartan (COZAAR) 25 MG tablet Take 12.5 mg by mouth daily.   melatonin 3 MG TABS tablet Take 3 mg by mouth at bedtime as needed (for sleep).    nitroGLYCERIN  (NITROSTAT ) 0.4 MG SL tablet Place 1 tablet (0.4 mg total) under the tongue every 5 (five) minutes as needed for chest pain (do not exceed 3 pills during one episode).   pantoprazole  (PROTONIX ) 40 MG tablet TAKE 1 TABLET DAILY (MUST KEEP UPCOMING APPOINTMENT IN AUGUST 2024 WITH DR.NAHSER BEFORE ANYMORE REFILLS)   potassium chloride  (KLOR-CON  M) 10 MEQ tablet Take 1 tablet (10 mEq total) by mouth daily as needed (take with daily as needed lasix .).   REPATHA  SURECLICK 140 MG/ML SOAJ INJECT 1 PEN INTO THE SKIN EVERY 14 DAYS   rosuvastatin  (CRESTOR ) 20 MG tablet TAKE 1 TABLET DAILY   SYNTHROID  100 MCG tablet TAKE 1 TABLET DAILY BEFORE BREAKFAST   SYRINGE-NEEDLE, DISP, 3 ML 25G X 1 3 ML MISC Patient is to use to self inject 1 ml of vitamin B12 per provider instructions.   tamsulosin  (FLOMAX ) 0.4 MG CAPS capsule Take  1 capsule (0.4 mg total) by mouth daily.   albuterol  (VENTOLIN  HFA) 108 (90 Base) MCG/ACT inhaler Inhale 2 puffs into the lungs every 6 (six) hours as needed for wheezing or shortness of breath. (Patient not taking: Reported on 08/18/2023)   benzonatate  (TESSALON ) 100 MG capsule Take 1 capsule (100 mg total) by mouth 3 (three) times daily as needed for cough. (Patient not taking: Reported on 08/18/2023)   cyanocobalamin  (VITAMIN B12) 1000 MCG/ML injection INJECT 1000MCG IM EVERY OTHER MONTH (IE EVERY 60 DAYS) (Patient not taking: Reported on 08/18/2023)   doxycycline  (VIBRA -TABS) 100 MG tablet Take 1 tablet (100 mg total) by mouth 2 (two) times  daily. (Patient not taking: Reported on 08/18/2023)   fluticasone (CUTIVATE) 0.05 % cream Apply topically. (Patient not taking: Reported on 08/18/2023)   ketoconazole (NIZORAL) 2 % cream Apply 1 Application topically 2 (two) times daily. (Patient not taking: Reported on 08/18/2023)   levocetirizine (XYZAL ) 5 MG tablet Take 1 tablet (5 mg total) by mouth every evening. (Patient not taking: Reported on 08/18/2023)   metroNIDAZOLE  (METROCREAM ) 0.75 % cream Apply 1 Application topically 2 (two) times daily. (Patient not taking: Reported on 08/18/2023)   oxyCODONE  (ROXICODONE ) 5 MG immediate release tablet Take 0.5-1 tablets (2.5-5 mg total) by mouth 3 (three) times daily as needed. (Patient not taking: Reported on 08/18/2023)   No facility-administered encounter medications on file as of 08/18/2023.    Allergies (verified) Imdur  [isosorbide  dinitrate], Iodine -131, Iohexol , Lisinopril , Pravastatin , Farxiga [dapagliflozin], Codeine, Simvastatin , and Statins   History: Past Medical History:  Diagnosis Date   AAA (abdominal aortic aneurysm) without rupture (HCC) 02/14/2014   US  1/19:  AAA 3 cm   ALCOHOL ABUSE, HX OF    distant history   Anxiety    occ panic sx, increased after death of mother   ANXIETY DEPRESSION 05/10/2007   history of, during difficult relationship   CAD (coronary artery disease) 08/28/2012   08/25/2012 Successful PTCA/DES x mid LAD // LAD prox 30, mid stent patent; D1 40; LCx ok; OM2 50, 50; RCA prox 20; EF 55-65  // Nuclear stress test 9/19: EF 58, small inferobasal infarct, no ischemia, Low Risk (inf defect ? 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 Waymond Hailey, pulmonary    Past Surgical History:  Procedure Laterality Date   APPENDECTOMY     BACK SURGERY  03/02/1995   lower  back x3   CARPAL TUNNEL RELEASE Right 2022   CARPOMETACARPAL (CMC) FUSION OF THUMB Left 04/02/2014   Procedure: CARPOMETACARPAL (CMC) FUSION OF THUMB/LEFT THUMB INTERPHALNGEAL JOINT FUSION;  Surgeon: Sheryl Donna, MD;  Location: [facility];  Service: Orthopedics;  Laterality: Left;   COLONOSCOPY     CORONARY STENT PLACEMENT     2.75 x 12 mm Promus Premier DES was deployed in the mid LAD. The stent was post-dilated with a 3.0 x 9 mm [place] balloon - Mid LAD.   LARYNGOSCOPY / BRONCHOSCOPY / ESOPHAGOSCOPY  03/01/2010   bx   LEFT HEART CATH AND CORONARY ANGIOGRAPHY N/A 05/28/2016   Procedure: Left Heart Cath and Coronary Angiography;  Surgeon: Arty Binning, MD;  Location: [facility] INVASIVE CV LAB;  Service: Cardiovascular;  Laterality: N/A;   LEFT HEART CATHETERIZATION WITH CORONARY ANGIOGRAM N/A 08/25/2012   Procedure: LEFT HEART CATHETERIZATION WITH CORONARY ANGIOGRAM;  Surgeon: Odie Benne, MD;  Location: [facility] CATH LAB;  Service: Cardiovascular;  Laterality: N/A;   LEFT HEART CATHETERIZATION WITH CORONARY ANGIOGRAM N/A 11/23/2013   Procedure: LEFT HEART CATHETERIZATION WITH CORONARY  ANGIOGRAM;  Surgeon: Lucendia Rusk, MD;  Location: [facility] CATH LAB;  Service: Cardiovascular;  Laterality: N/A;   TRANSCAROTID ARTERY REVASCULARIZATION  Right 10/15/2019   Procedure: RIGHT TRANSCAROTID ARTERY REVASCULARIZATION;  Surgeon: Young Hensen, MD;  Location: [facility] OR;  Service: Vascular;  Laterality: Right;   ULTRASOUND GUIDANCE FOR VASCULAR ACCESS Left 10/15/2019   Procedure: ULTRASOUND GUIDANCE FOR VASCULAR ACCESS;  Surgeon: Young Hensen, MD;  Location: [facility] OR;  Service: Vascular;  Laterality: Left;   WRIST FRACTURE SURGERY  08/21/2010   Right wrist x4   Family History  Problem Relation Age of Onset   Brain cancer Father    Dementia Father        h/o brain tumor   Heart disease Mother    Hypertension Mother    Heart disease Brother    Hypertension Brother    Hyperlipidemia Brother    Colon polyps Brother    Colon cancer Neg Hx    Prostate cancer Neg Hx    Esophageal cancer Neg Hx    Pancreatic cancer Neg Hx    Liver disease Neg Hx    Throat cancer Neg Hx    Social History   Socioeconomic History   Marital status: Married    Spouse name: Not on file   Number of children: 1   Years of education: Not on file   Highest education level: Not on file  Occupational History   Occupation:      Employer: UNEMPLOYED    Comment: Retired  Tobacco Use   Smoking status: Former    Current packs/day: 0.00    Types: Cigarettes    Quit date: 09/30/2010    Years since quitting: 12.8   Smokeless tobacco: Former  Building services engineer status: Never Used  Substance and Sexual Activity   Alcohol use: Not Currently   Drug use: No   Sexual activity: Yes  Other Topics Concern   Not on file  Social History Narrative   Married 8/13. 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  Noted tinnitus, h/o noise exposure that was service related.    Social Drivers of Health   Financial Resource Strain: Low Risk  (08/18/2023)    Overall Financial Resource Strain (CARDIA)    Difficulty of Paying Living Expenses:  Not hard at all  Food Insecurity: No Food Insecurity (08/18/2023)   Hunger Vital Sign    Worried About Running Out of Food in the Last Year: Never true    Ran Out of Food in the Last Year: Never true  Transportation Needs: No Transportation Needs (08/18/2023)   PRAPARE - Administrator, Civil Service (Medical): No    Lack of Transportation (Non-Medical): No  Physical Activity: Inactive (08/18/2023)   Exercise Vital Sign    Days of Exercise per Week: 0 days    Minutes of Exercise per Session: 0 min  Stress: Stress Concern Present (08/18/2023)   [employer] of Occupational Health - Occupational Stress Questionnaire    Feeling of Stress: To some extent  Social Connections: Socially Integrated (08/18/2023)   Social Connection and Isolation Panel    Frequency of Communication with Friends and Family: Once a week    Frequency of Social Gatherings with Friends and Family: Twice a week    Attends Religious Services: More than 4 times per year    Active Member of [employer] or Organizations: Yes    Attends Banker Meetings: 1 to 4 times per year    Marital Status: Married    Tobacco Counseling Counseling given: Not Answered    Clinical Intake:  Pre-visit preparation completed: Yes  Pain : 0-10 Pain Score: 6  Pain Type: Chronic pain Pain Location: Abdomen (neck) Pain Orientation: Lower Pain Descriptors / Indicators: Stabbing, Sharp, Aching Pain Onset: More than a month ago Pain Frequency: Constant Pain Relieving Factors: heat, Effect of Pain on Daily Activities: takes all my energy  Pain Relieving Factors: heat,  BMI - recorded: 31.04 Nutritional Status: BMI > 30  Obese Nutritional Risks: None Diabetes: No  Lab Results  Component Value Date   HGBA1C 5.9 08/01/2018   HGBA1C 5.9 10/22/2016   HGBA1C 5.7 10/21/2015     How often do you need to have someone help you  when you read instructions, pamphlets, or other written materials from your doctor or pharmacy?: 1 - Never  Interpreter Needed?: No  Comments: lives with wife Information entered by :: B.Nomie Buchberger,LPN   Activities of Daily Living     08/18/2023   11:51 AM  In your present state of health, do you have any difficulty performing the following activities:  Hearing? 1  Vision? 0  Difficulty concentrating or making decisions? 0  Walking or climbing stairs? 1  Dressing or bathing? 0  Doing errands, shopping? 0  Preparing Food and eating ? N  Using the Toilet? N  In the past six months, have you accidently leaked urine? N  Do you have problems with loss of bowel control? N  Managing your Medications? N  Managing your Finances? N  Housekeeping or managing your Housekeeping? N    Patient Care Team: Donnie Galea, MD as PCP - General (Family Medicine) Nahser, Lela Purple, MD as PCP - Cardiology (Cardiology)  I have updated your Care Teams any recent Medical Services you may have received from other providers in the past year.     Assessment:   This is a routine wellness examination for Tony Hunt.  Hearing/Vision screen Hearing Screening - Comments:: Ringing in rt ear all the time;hears well in left ear Vision Screening - Comments:: Pt says his vision is good Vision Works-UTD w/appts   Goals Addressed               This Visit's Progress     patient (  pt-stated)        I would like to manage pain better and get some relief        Depression Screen     08/18/2023   11:47 AM 08/17/2022   11:40 AM 08/07/2020    2:43 PM 10/27/2018    9:04 AM 10/24/2017    9:56 AM 10/21/2016   11:02 AM 10/28/2015    9:58 AM  PHQ 2/9 Scores  PHQ - 2 Score 0 0 0 0 0 0 0  PHQ- 9 Score  0 3 1 0 0     Fall Risk     08/18/2023   11:41 AM 05/19/2023   12:12 PM 08/17/2022   11:40 AM 08/14/2021   10:07 AM 08/07/2020    2:43 PM  Fall Risk   Falls in the past year? 0 0 0 0 0  Number falls in past yr: 0  0 0 0   Injury with Fall? 0 0 0 0   Risk for fall due to : No Fall Risks No Fall Risks No Fall Risks No Fall Risks   Follow up Education provided;Falls prevention discussed Falls evaluation completed Falls evaluation completed Falls evaluation completed       Data saved with a previous flowsheet row definition    MEDICARE RISK AT HOME:  Medicare Risk at Home Any stairs in or around the home?: Yes If so, are there any without handrails?: Yes Home free of loose throw rugs in walkways, pet beds, electrical cords, etc?: Yes Adequate lighting in your home to reduce risk of falls?: Yes Life alert?: No Use of a cane, walker or w/c?: No Grab bars in the bathroom?: No Shower chair or bench in shower?: No Elevated toilet seat or a handicapped toilet?: No  TIMED UP AND GO:  Was the test performed?  No  Cognitive Function: 6CIT completed    10/27/2018    9:07 AM 10/24/2017    9:57 AM 10/21/2016   11:02 AM  MMSE - Mini Mental State Exam  Orientation to time 4 5 5    Orientation to time comments did not know date    Orientation to Place 5 5 5    Registration 3 3 3    Attention/ Calculation 5 0 0   Recall 3 3 2    Recall-comments   pt was unable to recall 1 of 3 words   Language- name 2 objects 0 0 0   Language- repeat 1 1 1   Language- follow 3 step command 0 3 3   Language- read & follow direction 0 0 0   Write a sentence 0 0 0   Copy design 0 0 0   Total score 21 20 19       Data saved with a previous flowsheet row definition        08/18/2023   11:54 AM  6CIT Screen  What Year? 0 points  What month? 0 points  What time? 0 points  Count back from 20 0 points  Months in reverse 0 points  Repeat phrase 0 points  Total Score 0 points    Immunizations Immunization History  Administered Date(s) Administered   Fluad Quad(high Dose 65+) 12/27/2019   Influenza Whole 01/13/2005   Influenza, High Dose Seasonal PF 11/09/2021   Influenza, Seasonal, Injecte, Preservative Fre  12/06/2014   Influenza,inj,Quad PF,6+ Mos 10/28/2015, 01/05/2017, 11/03/2017, 11/03/2018   Influenza-Unspecified 11/20/2013, 11/19/2020   PFIZER(Purple Top)SARS-COV-2 Vaccination 04/06/2019, 05/01/2019, 11/27/2019   PNEUMOCOCCAL CONJUGATE-20 08/07/2020   Pneumococcal Polysaccharide-23  07/02/2013   Td 04/01/2001   Tdap 03/06/2012   Zoster Recombinant(Shingrix) 09/10/2020, 11/19/2020   Zoster, Live 08/01/2014    Screening Tests Health Maintenance  Topic Date Due   DTaP/Tdap/Td (3 - Td or Tdap) 03/06/2022   INFLUENZA VACCINE  09/30/2023   Medicare Annual Wellness (AWV)  08/17/2024   Colonoscopy  06/21/2029   Pneumococcal Vaccine: 50+ Years  Completed   Hepatitis C Screening  Completed   Zoster Vaccines- Shingrix  Completed   HPV VACCINES  Aged Out   Meningococcal B Vaccine  Aged Out   COVID-19 Vaccine  Discontinued    Health Maintenance  Health Maintenance Due  Topic Date Due   DTaP/Tdap/Td (3 - Td or Tdap) 03/06/2022   Health Maintenance Items Addressed: None at this time  Additional Screening:  Vision Screening: Recommended annual ophthalmology exams for early detection of glaucoma and other disorders of the eye. Would you like a referral to an eye doctor? No    Dental Screening: Recommended annual dental exams for proper oral hygiene  Community Resource Referral / Chronic Care Management: CRR required this visit?  No   CCM required this visit?  No   Plan:    I have personally reviewed and noted the following in the patient's chart:   Medical and social history Use of alcohol, tobacco or illicit drugs  Current medications and supplements including opioid prescriptions. Patient is not currently taking opioid prescriptions. Functional ability and status Nutritional status Physical activity Advanced directives List of other physicians Hospitalizations, surgeries, and ER visits in previous 12 months Vitals Screenings to include cognitive, depression, and  falls Referrals and appointments  In addition, I have reviewed and discussed with patient certain preventive protocols, quality metrics, and best practice recommendations. A written personalized care plan for preventive services as well as general preventive health recommendations were provided to patient.   Nerissa Bannister, LPN   [id number]   After Visit Summary: (MyChart) Due to this being a telephonic visit, the after visit summary with patients personalized plan was offered to patient via MyChart    Notes: Nothing significant to report at this time.

## 2023-08-24 DIAGNOSIS — R519 Headache, unspecified: Secondary | ICD-10-CM | POA: Diagnosis not present

## 2023-08-24 NOTE — Progress Notes (Unsigned)
 Cardiology Office Note    Patient Name: Tony Hunt Date of Encounter: 08/24/2023  Primary Care Provider:  Cleatus Arlyss RAMAN, MD Primary Cardiologist:  Tony Passe, MD Primary Electrophysiologist: None   Past Medical History    Past Medical History:  Diagnosis Date   AAA (abdominal aortic aneurysm) without rupture (HCC) 02/14/2014   US  1/19:  AAA 3 cm   ALCOHOL ABUSE, HX OF    distant history   Anxiety    occ panic sx, increased after death of mother   ANXIETY DEPRESSION 05/10/2007   history of, during difficult relationship   CAD (coronary artery disease) 08/28/2012   08/25/2012 Successful PTCA/DES x mid LAD // LAD prox 30, mid stent patent; D1 40; LCx ok; OM2 50, 50; RCA prox 20; EF 55-65  // Nuclear stress test 9/19: EF 58, small inferobasal infarct, no ischemia, Low Risk (inf defect ? 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   prev protenuria, treated with steroids   Chronic diastolic CHF (congestive heart failure) (HCC) 12/21/2017   Echo 11/17/2017 - Mild concentric LVH, EF 65-70, normal wall motion, grade 2 diastolic dysfunction, mild LAE, normal RVSF, trivial TR   CKD (chronic kidney disease)    Nephrologist is Tony Hunt (10/03/19)   Dilatation of aorta (HCC) 02/14/2014   Diverticulosis    ESOPHAGEAL STRICTURE 05/10/2007   GERD 05/10/2007   Heart disease    HIATAL HERNIA 05/10/2007   History of echocardiogram    Echo 9/19: mild conc LVH, vigorous LVF, EF 65-70, Gr 2 DD, mild LAE, normal RVSF, trivial AI   History of radiation therapy    HYPERLIPIDEMIA 05/10/2007   HYPERTENSION 05/10/2007   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 male with a PMH of CAD s/p DES to LAD 2014, HTN, hypothyroidism, HLD, AAA, carotid artery disease s/p right carotid artery stent 10/15/2019, HFpEF, CKD stage III who presents today for annual follow-up.  Tony Hunt has been followed by Tony Hunt since 2014 for evaluation of chest pain and underwent an urgent LHC for that showed 90% stenosis of the mid LAD treated with DES x 1.  He had a repeat LHC in 2018 that showed patent LAD stent.  He was seen on 10/05/2021 with complaint of shortness of breath on exertion.  He completed a  Lexiscan  Myoview  on 10/12/2021 that was low risk with no evidence of ischemia or infarction.  He is followed by [facility] for management of AAA with most recent carotid ultrasound completed on 10/12/2022 that showed an increase from 3.6 cm to 4 cm.  He was last seen by Tony Hunt on 10/28/2022 for annual follow-up.  During visit he denies chest pain but does have chronic dyspnea most recent 2D echo completed 2021 showing normal LV function with grade 2 DD.  During his visit blood pressures were well-controlled and carotid duplex was also completed for surveillance.  Tony Hunt presents today for annual follow-up.He is under the care of Tony Hunt for his aneurysms.  He has a history of abdominal aortic aneurysm and carotid aneurysm,  with a recent CT scan of the abdomen performed in August 2024. He is ensuring his echocardiogram is up to date for his DOT physical, required every two years. He experiences chronic shortness of breath, previously evaluated with a normal stress test in August 2023. No recurrence of shortness of breath or chest tightness has occurred since then. He is on aspirin  and Plavix , experiencing bruising as a side effect, but no bleeding in urine or stool. He also takes isosorbide  15 mg. His blood pressure is well-controlled. He walks at least half a mile daily, feeling 'pretty good' afterward. No chest tightness or myalgias from medications. His cholesterol  was checked in June, with an LDL of 80. He is working on increasing activity and dietary changes to improve this. He has a history of kidney issues and is under the care of a nephrologist at [facility]. He recently had an appointment two weeks ago. His previous nephrologist retired, and he is establishing care with a new kidney doctor. 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 Patient denies chest pain, palpitations, dyspnea, PND, orthopnea, nausea, vomiting, dizziness, syncope, edema, weight gain, or early satiety.   Discussed the use of AI scribe software for clinical note transcription with the patient, who gave verbal consent to proceed.  History of Present Illness   Review of Systems  Please see the history of present illness.    All other systems reviewed and are otherwise negative except as noted above.  Physical Exam    Wt Readings from Last 3 Encounters:  08/18/23 198 lb 3.2 oz (89.9 kg)  06/14/23 195 lb (88.5 kg)  05/19/23 192 lb 12.8 oz (87.5 kg)   CD:Uyzmz were no vitals filed for this visit.,There is no height or weight on file to calculate BMI. GEN: Well nourished, well developed in no acute distress Neck: No JVD; No carotid bruits Pulmonary: Clear to auscultation without rales, wheezing or rhonchi  Cardiovascular: Normal rate. Regular rhythm. Normal S1. Normal S2.   Murmurs: There is no murmur.  ABDOMEN: Soft, non-tender, non-distended EXTREMITIES:  No edema; No deformity   EKG/LABS/ Recent Cardiac Studies   ECG personally reviewed by me today -sinus rhythm with rate of 67 bpm and left axis deviation with RBBB with no acute changes from previous EKG  Risk Assessment/Calculations:          Lab Results  Component Value Date   WBC 7.2 08/18/2023   HGB 12.7 (L) 08/18/2023   HCT 37.4 (L) 08/18/2023    MCV 91.1 08/18/2023   PLT 319.0 08/18/2023   Lab Results  Component Value Date   CREATININE 1.93 (H) 08/18/2023   BUN 16 08/18/2023   NA 142 08/18/2023   K 4.2 08/18/2023   CL 107 08/18/2023   CO2 28 08/18/2023   Lab Results  Component Value Date   CHOL 137 08/18/2023   HDL 37.00 (L) 08/18/2023   LDLCALC 80 08/18/2023   LDLDIRECT 106.0 10/20/2018   TRIG 96.0 08/18/2023   CHOLHDL 4 08/18/2023    Lab Results  Component Value Date   HGBA1C 5.9 08/01/2018   Assessment & Plan    Assessment and Plan Assessment & Plan Chronic shortness of breath Condition well-managed with no recurrence. Normal stress test and echocardiogram showed myocardial stiffness without significant changes. - Order echocardiogram for DOT physical requirements.  Stiffness  of heart muscle Myocardial stiffness noted on echocardiogram. No significant changes or valve abnormalities. Well-managed with no new symptoms.  Abdominal aortic aneurysm Managed by Tony Hunt. Recent CT scan showed no new concerns. - Provide copy of last CT scan for DOT physical.  Carotid artery aneurysm Managed by Tony Hunt. Blood pressure well-controlled. No new concerns.  Controlled hypertension Blood pressure well-controlled.  Chronic kidney disease Managed by nephrologist at [facility]. Kidney function elevated but under regular care. Recent nephrology appointment two weeks ago.  Bruising due to medication Bruising from aspirin  and Plavix . No hematuria or melena.  Follow-up Establish care with new cardiologist due to previous nephrologist's retirement. Dr. Reeta and Dr. Floretta available. - Schedule follow-up appointment in six months with new cardiologist. - Provide copies of last stress test and CT scan for DOT physical. - Coordinate with [facility] for faxing documents for DOT physical.   1.  HFpEF: - Previous 2D echo completed in 2023 and reports improvement to shortness of breath since prior visit. Order  echocardiogram for DOT physical requirements. - Patient also noted to have new right bundle branch block on EKG - Continue losartan 25 mg daily, Lasix  20 mg as needed, potassium 10 mEq as needed  2.  History of CAD: -CAD s/p DES to LAD 2014 and currently no recurrence of chest pain with LAD ischemic evaluation completed in 2023 by Lexiscan  which was normal -Continue ASA 81 mg, Plavix  75 mg, Repatha  140 mg q. 14 days, Tricor  48 mg, Imdur  50 mg and Crestor  20 mg daily  3.  History of AAA: - Currently followed by [facility] with most recent surveillance CT completed in 2024 - Continue current antihypertensive regimen with losartan 12.5 mg  4.  Carotid stenosis: - Stable and currently followed by [facility] last Doppler completed last year with no significant change - Continue Repatha , Tricor  and Crestor  as prescribed  5.  Essential hypertension: - Patient's blood pressure is controlled today at 126/78 - Continue losartan 12.5 mg  6.  Hyperlipidemia: - Patient's last LDL cholesterol was 80 mildly above goal - Continue Tricor  48 mg, Crestor  20 mg and Repatha  140 mg q. 14 days  Disposition: Follow-up with TonySegal or APP in 6 months    Signed, Wyn Raddle, Jackee Shove, NP 08/24/2023, 1:10 PM [facility]

## 2023-08-25 ENCOUNTER — Encounter: Payer: Self-pay | Admitting: Family Medicine

## 2023-08-25 ENCOUNTER — Encounter: Payer: Self-pay | Admitting: Nurse Practitioner

## 2023-08-25 ENCOUNTER — Ambulatory Visit: Attending: Nurse Practitioner | Admitting: Nurse Practitioner

## 2023-08-25 ENCOUNTER — Ambulatory Visit (INDEPENDENT_AMBULATORY_CARE_PROVIDER_SITE_OTHER): Admitting: Family Medicine

## 2023-08-25 VITALS — BP 126/78 | HR 67 | Ht 67.0 in | Wt 193.6 lb

## 2023-08-25 VITALS — BP 132/78 | HR 69 | Temp 98.5°F | Ht 66.3 in | Wt 193.6 lb

## 2023-08-25 DIAGNOSIS — I2510 Atherosclerotic heart disease of native coronary artery without angina pectoris: Secondary | ICD-10-CM | POA: Insufficient documentation

## 2023-08-25 DIAGNOSIS — M109 Gout, unspecified: Secondary | ICD-10-CM | POA: Diagnosis not present

## 2023-08-25 DIAGNOSIS — I5032 Chronic diastolic (congestive) heart failure: Secondary | ICD-10-CM | POA: Diagnosis not present

## 2023-08-25 DIAGNOSIS — E039 Hypothyroidism, unspecified: Secondary | ICD-10-CM

## 2023-08-25 DIAGNOSIS — E538 Deficiency of other specified B group vitamins: Secondary | ICD-10-CM

## 2023-08-25 DIAGNOSIS — Z7189 Other specified counseling: Secondary | ICD-10-CM

## 2023-08-25 DIAGNOSIS — R195 Other fecal abnormalities: Secondary | ICD-10-CM | POA: Diagnosis not present

## 2023-08-25 DIAGNOSIS — I779 Disorder of arteries and arterioles, unspecified: Secondary | ICD-10-CM | POA: Diagnosis not present

## 2023-08-25 DIAGNOSIS — G43109 Migraine with aura, not intractable, without status migrainosus: Secondary | ICD-10-CM

## 2023-08-25 DIAGNOSIS — E782 Mixed hyperlipidemia: Secondary | ICD-10-CM | POA: Diagnosis not present

## 2023-08-25 DIAGNOSIS — N032 Chronic nephritic syndrome with diffuse membranous glomerulonephritis: Secondary | ICD-10-CM

## 2023-08-25 DIAGNOSIS — I10 Essential (primary) hypertension: Secondary | ICD-10-CM | POA: Diagnosis not present

## 2023-08-25 DIAGNOSIS — I7143 Infrarenal abdominal aortic aneurysm, without rupture: Secondary | ICD-10-CM | POA: Diagnosis not present

## 2023-08-25 DIAGNOSIS — Z0000 Encounter for general adult medical examination without abnormal findings: Secondary | ICD-10-CM

## 2023-08-25 MED ORDER — AMOXICILLIN-POT CLAVULANATE 875-125 MG PO TABS
1.0000 | ORAL_TABLET | Freq: Two times a day (BID) | ORAL | 0 refills | Status: DC
Start: 2023-08-25 — End: 2023-11-29

## 2023-08-25 MED ORDER — SYRINGE/NEEDLE (DISP) 25G X 1" 3 ML MISC: 1 refills | Status: AC

## 2023-08-25 MED ORDER — POLYETHYLENE GLYCOL 3350 17 GM/SCOOP PO POWD: 17.0000 g | Freq: Two times a day (BID) | ORAL | Status: AC | PRN

## 2023-08-25 MED ORDER — LEVOTHYROXINE SODIUM 100 MCG PO TABS: 100.0000 ug | ORAL_TABLET | Freq: Every day | ORAL | 3 refills | Status: DC

## 2023-08-25 MED ORDER — GABAPENTIN 300 MG PO CAPS: 600.0000 mg | ORAL_CAPSULE | Freq: Every day | ORAL | Status: AC

## 2023-08-25 MED ORDER — CYANOCOBALAMIN 1000 MCG/ML IJ SOLN: INTRAMUSCULAR | 1 refills | Status: DC

## 2023-08-25 NOTE — Progress Notes (Signed)
 Gout.  3 episodes in 6 week period earlier this year.  Now on allopurinol and uric acid lower.  No flares in the meantime with allopurinol start.    Taking 300mg  gabapentin , 2 tabs at bedtime.  He had trouble swallowing 600mg  tab due to pill size.  Swallowing food well.  Radiation hx noted.  Less migraines with use.  Rare events overall.    Elevated Cholesterol: Using medications without problems: yes Muscle aches: no Diet compliance:yes Exercise:yes Labs d/w pt.   CKD.  Seeing renal at baseline.  Avoids NSAIDS.  Labs d/w pt.    Has f/u re: AAA pending, d/w pt.    Oxycodone  removed from med list, not used.   B12 d/w pt.  He got out of the habit of taking med, lower level, d/w pt about restart.  Slightly lower hgb noted.    Hypothyroidism.  TSH wnl. No ADE on med.  Compliant.  Taking early AM, w/o food.    Flu due this fall Shingrix prev done PNA prev done Tetanus 2014, d/w pt.   RSV d/w pt.  Covid prev done.   Colonoscopy 2021 Prostate cancer screening 2020, d/w pt.  PSA wnl.   Advance directive- wife designated if patient were incapacitated.   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   HCV screening prev neg.   He had hard stools recently, over the last month.  No bloody or black stools.  No fevers or chills.  Some lower abd discomfort, sore to the touch on the L lower abd.   Diet had been off, less vegetables recently.   Discussed options.  Meds, vitals, and allergies reviewed.   ROS: Per HPI unless specifically indicated in ROS section   GEN: nad, alert and oriented HEENT: mucous membranes moist NECK: supple w/o LA CV: rrr.  no murmur PULM: ctab, no inc wob ABD: soft, +bs EXT: no edema SKIN: no acute rash LLQ mildly ttp w/o rebound.

## 2023-08-25 NOTE — Patient Instructions (Signed)
 Medication Instructions:  Your physician recommends that you continue on your current medications as directed. Please refer to the Current Medication list given to you today. *If you need a refill on your cardiac medications before your next appointment, please call your pharmacy*  Lab Work: None Ordered If you have labs (blood work) drawn today and your tests are completely normal, you will receive your results only by: MyChart Message (if you have MyChart) OR A paper copy in the mail If you have any lab test that is abnormal or we need to change your treatment, we will call you to review the results.  Testing/Procedures: Your physician has requested that you have an echocardiogram. Echocardiography is a painless test that uses sound waves to create images of your heart. It provides your doctor with information about the size and shape of your heart and how well your heart's chambers and valves are working. This procedure takes approximately one hour. There are no restrictions for this procedure. Please do NOT wear cologne, perfume, aftershave, or lotions (deodorant is allowed). Please arrive 15 minutes prior to your appointment time.  Please note: We ask at that you not bring children with you during ultrasound (echo/ vascular) testing. Due to room size and safety concerns, children are not allowed in the ultrasound rooms during exams. Our front office staff cannot provide observation of children in our lobby area while testing is being conducted. An adult accompanying a patient to their appointment will only be allowed in the ultrasound room at the discretion of the ultrasound technician under special circumstances. We apologize for any inconvenience.  Follow-Up: At [facility], you and your health needs are our priority.  As part of our continuing mission to provide you with exceptional heart care, our providers are all part of one team.  This team includes your primary Cardiologist  (physician) and Advanced Practice Providers or APPs (Physician Assistants and Nurse Practitioners) who all work together to provide you with the care you need, when you need it.  Your next appointment:   6 month(s)  Provider:   Emeline Kriste COME    We recommend signing up for the patient portal called MyChart.  Sign up information is provided on this After Visit Summary.  MyChart is used to connect with patients for Virtual Visits (Telemedicine).  Patients are able to view lab/test results, encounter notes, upcoming appointments, etc.  Non-urgent messages can be sent to your provider as well.   To learn more about what you can do with MyChart, go to [email or web address].   Other Instructions

## 2023-08-25 NOTE — Patient Instructions (Addendum)
 Recheck B12 level and HGB about 2 weeks after your 2nd shot of B12.  Mid September- lab visit.  Nonfasting.   Try OTC miralax twice a day if needed.  If worse pain or a fever, then start augmentin  and update us .  Take care.  Glad to see you.

## 2023-08-28 DIAGNOSIS — R195 Other fecal abnormalities: Secondary | ICD-10-CM | POA: Insufficient documentation

## 2023-08-28 NOTE — Assessment & Plan Note (Signed)
 3 episodes in 6 week period earlier this year.  Now on allopurinol and uric acid lower.  No flares in the meantime with allopurinol start.  Continue as is.

## 2023-08-28 NOTE — Assessment & Plan Note (Signed)
 With clear variation in diet. I do not suspect diverticulitis at this point.  Discussed options.  I would try OTC miralax  twice a day if needed.  If worse pain or a fever, then start augmentin  and update us .  He agrees with plan.  Okay for outpatient follow-up.

## 2023-08-28 NOTE — Assessment & Plan Note (Signed)
 Has f/u re: AAA pending, d/w pt. I will defer.  He agrees.

## 2023-08-28 NOTE — Assessment & Plan Note (Signed)
 Seeing renal at baseline.  Avoids NSAIDS.  Labs d/w pt.   Continue as is.

## 2023-08-28 NOTE — Assessment & Plan Note (Signed)
 Advance directive- wife designated if patient were incapacitated.

## 2023-08-28 NOTE — Assessment & Plan Note (Signed)
 TSH wnl.  Continue levothyroxine .  Labs discussed with patient.

## 2023-08-28 NOTE — Assessment & Plan Note (Signed)
 Flu due this fall Shingrix prev done PNA prev done Tetanus 2014, d/w pt.   RSV d/w pt.  Covid prev done.   Colonoscopy 2021 Prostate cancer screening 2020, d/w pt.  PSA wnl.   Advance directive- wife designated if patient were incapacitated.   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   HCV screening prev neg.

## 2023-08-28 NOTE — Assessment & Plan Note (Signed)
 Continue work on diet and exercise.  Continue Repatha .  Labs discussed with patient.

## 2023-08-28 NOTE — Assessment & Plan Note (Addendum)
 B12 d/w pt.  He got out of the habit of taking med, lower level, d/w pt about restart.  Slightly lower hgb noted.   Recheck B12 level and HGB about 2 weeks after 2nd shot of B12.  Ie mid September- lab visit.  Nonfasting.

## 2023-08-28 NOTE — Assessment & Plan Note (Signed)
 Taking 300mg  gabapentin , 2 tabs at bedtime.  He had trouble swallowing 600mg  tab due to pill size.  Swallowing food well.  Radiation hx noted.  Less migraines with use.  Rare events overall.   Continue as is.

## 2023-09-06 DIAGNOSIS — M542 Cervicalgia: Secondary | ICD-10-CM | POA: Diagnosis not present

## 2023-09-06 DIAGNOSIS — G518 Other disorders of facial nerve: Secondary | ICD-10-CM | POA: Diagnosis not present

## 2023-09-06 DIAGNOSIS — G4486 Cervicogenic headache: Secondary | ICD-10-CM | POA: Diagnosis not present

## 2023-09-06 DIAGNOSIS — M791 Myalgia, unspecified site: Secondary | ICD-10-CM | POA: Diagnosis not present

## 2023-09-20 DIAGNOSIS — M542 Cervicalgia: Secondary | ICD-10-CM | POA: Diagnosis not present

## 2023-09-20 DIAGNOSIS — G518 Other disorders of facial nerve: Secondary | ICD-10-CM | POA: Diagnosis not present

## 2023-09-20 DIAGNOSIS — G4486 Cervicogenic headache: Secondary | ICD-10-CM | POA: Diagnosis not present

## 2023-09-20 DIAGNOSIS — M791 Myalgia, unspecified site: Secondary | ICD-10-CM | POA: Diagnosis not present

## 2023-09-23 ENCOUNTER — Other Ambulatory Visit: Payer: Self-pay

## 2023-09-23 MED ORDER — PANTOPRAZOLE SODIUM 40 MG PO TBEC: 40.0000 mg | DELAYED_RELEASE_TABLET | Freq: Every day | ORAL | 3 refills | Status: AC

## 2023-09-27 ENCOUNTER — Ambulatory Visit (HOSPITAL_BASED_OUTPATIENT_CLINIC_OR_DEPARTMENT_OTHER)

## 2023-09-27 DIAGNOSIS — I779 Disorder of arteries and arterioles, unspecified: Secondary | ICD-10-CM | POA: Diagnosis not present

## 2023-09-27 DIAGNOSIS — I10 Essential (primary) hypertension: Secondary | ICD-10-CM | POA: Diagnosis not present

## 2023-09-27 DIAGNOSIS — E782 Mixed hyperlipidemia: Secondary | ICD-10-CM

## 2023-09-27 DIAGNOSIS — I7143 Infrarenal abdominal aortic aneurysm, without rupture: Secondary | ICD-10-CM | POA: Diagnosis not present

## 2023-09-27 DIAGNOSIS — I5032 Chronic diastolic (congestive) heart failure: Secondary | ICD-10-CM | POA: Diagnosis not present

## 2023-09-27 DIAGNOSIS — I2510 Atherosclerotic heart disease of native coronary artery without angina pectoris: Secondary | ICD-10-CM

## 2023-09-27 LAB — ECHOCARDIOGRAM COMPLETE
AR max vel: 2.11 cm2
AV Area VTI: 2.43 cm2
AV Area mean vel: 2.31 cm2
AV Mean grad: 3 mmHg
AV Peak grad: 6.3 mmHg
AV Vena cont: 0.31 cm
Ao pk vel: 1.25 m/s
Area-P 1/2: 3.99 cm2
S' Lateral: 3.3 cm

## 2023-09-28 ENCOUNTER — Ambulatory Visit: Payer: Self-pay | Admitting: General Practice

## 2023-10-05 NOTE — Telephone Encounter (Signed)
 Spoke with the patient who states he needs a letter that says he is ok to drive. He states he needs the letter by Friday October 07, 2023. He states he has been out of work for the last 6 weeks waiting for this letter. The patient states he has already completed his echo and his stress test will expire on the 14th and he wants to have all his paperwork in so he wont have to start the process over. He states if he has to start over it will cost his $150.   He states he just needs this letter so he can get back to work.   He states someone can call him when the letter is ready and he can swing by and pick it up.

## 2023-10-06 NOTE — Telephone Encounter (Signed)
 Spoke with patient to inform he that I have printed out the letter and had Josefa Beauvais, NP sign it. I will leave it at the front desk for patient to pick-up. Patient verbalized understanding and thanked me for the call.

## 2023-10-07 ENCOUNTER — Other Ambulatory Visit: Payer: Self-pay | Admitting: Family Medicine

## 2023-10-12 DIAGNOSIS — G518 Other disorders of facial nerve: Secondary | ICD-10-CM | POA: Diagnosis not present

## 2023-10-12 DIAGNOSIS — M791 Myalgia, unspecified site: Secondary | ICD-10-CM | POA: Diagnosis not present

## 2023-10-12 DIAGNOSIS — R519 Headache, unspecified: Secondary | ICD-10-CM | POA: Diagnosis not present

## 2023-10-12 DIAGNOSIS — M542 Cervicalgia: Secondary | ICD-10-CM | POA: Diagnosis not present

## 2023-10-17 DIAGNOSIS — M47812 Spondylosis without myelopathy or radiculopathy, cervical region: Secondary | ICD-10-CM | POA: Diagnosis not present

## 2023-11-03 ENCOUNTER — Other Ambulatory Visit: Payer: Self-pay | Admitting: Family Medicine

## 2023-11-03 DIAGNOSIS — M542 Cervicalgia: Secondary | ICD-10-CM | POA: Diagnosis not present

## 2023-11-03 DIAGNOSIS — G518 Other disorders of facial nerve: Secondary | ICD-10-CM | POA: Diagnosis not present

## 2023-11-03 DIAGNOSIS — G4486 Cervicogenic headache: Secondary | ICD-10-CM | POA: Diagnosis not present

## 2023-11-03 DIAGNOSIS — M791 Myalgia, unspecified site: Secondary | ICD-10-CM | POA: Diagnosis not present

## 2023-11-17 ENCOUNTER — Other Ambulatory Visit: Payer: Self-pay

## 2023-11-17 DIAGNOSIS — I7143 Infrarenal abdominal aortic aneurysm, without rupture: Secondary | ICD-10-CM

## 2023-11-17 DIAGNOSIS — I779 Disorder of arteries and arterioles, unspecified: Secondary | ICD-10-CM

## 2023-11-28 ENCOUNTER — Ambulatory Visit: Payer: Self-pay

## 2023-11-28 ENCOUNTER — Other Ambulatory Visit: Payer: Self-pay | Admitting: Family Medicine

## 2023-11-28 MED ORDER — PREDNISONE 10 MG PO TABS: ORAL_TABLET | ORAL | 0 refills | Status: DC

## 2023-11-28 MED ORDER — PREDNISONE 10 MG PO TABS: ORAL_TABLET | ORAL | Status: DC

## 2023-11-28 NOTE — Telephone Encounter (Signed)
 Left message for patient to advise that prescription for prednisone  has been sent. Patient is scheduled to be seen 9/30

## 2023-11-28 NOTE — Telephone Encounter (Signed)
 FYI Only or Action Required?: Action required by provider: update on patient condition.- Patient requests appointment with Dr Cleatus and refuses ED/UC as alternative- appt made 10/6 and waitlisted.   Patient was last seen in primary care on 08/25/2023 by Cleatus Arlyss RAMAN, MD.  Called Nurse Triage reporting Gout.  Symptoms began yesterday.  Interventions attempted: Prescription medications: allopurinol.  Symptoms are: gradually worsening.  Triage Disposition: See HCP Within 4 Hours (Or PCP Triage)  Patient/caregiver understands and will follow disposition?: No, wishes to speak with PCP  Copied from CRM #[id number]. Topic: Clinical - Red Word Triage >> Nov 28, 2023  1:41 PM Anairis L wrote: [facility] that prompted transfer to Nurse Triage: Due to gout pain, patient can not walk. Reason for Disposition  [1] SEVERE pain (e.g., excruciating, unable to do any normal activities) AND [2] not improved after 2 hours of pain medicine  Answer Assessment - Initial Assessment Questions Allipurinol 200mg  in the evening  Gout flare 3x in the last few weeks - Has 2-3 real bad days and then tapers off. Patient wanting appointment to adjust meds.   1. ONSET: When did the pain start?      Yesterday woke him at 3-4am  2. LOCATION: Where is the pain located?      Right foot- in 3 of his toes (starting with big toe) swelling on top and the bottom  3. PAIN: How bad is the pain?    (Scale 1-10; or mild, moderate, severe)     10/10 burning  5. CAUSE: What do you think is causing the foot pain?     Gout Flare 6. OTHER SYMPTOMS: Do you have any other symptoms? (e.g., leg pain, rash, fever, numbness)     denies  Protocols used: Foot Pain-A-AH

## 2023-11-28 NOTE — Telephone Encounter (Signed)
 We have an open slot on the schedule tomorrow.  See if he can come in.    If needed, he could take prednisone  for a flare. I sent the rx.  Thanks.

## 2023-11-28 NOTE — Addendum Note (Signed)
 Addended by: CLEATUS ARLYSS RAMAN on: 11/28/2023 05:05 PM   Modules accepted: Orders

## 2023-11-29 ENCOUNTER — Ambulatory Visit (INDEPENDENT_AMBULATORY_CARE_PROVIDER_SITE_OTHER): Admitting: Family Medicine

## 2023-11-29 ENCOUNTER — Encounter: Payer: Self-pay | Admitting: Family Medicine

## 2023-11-29 VITALS — BP 142/76 | HR 100 | Temp 98.4°F | Ht 66.3 in | Wt 195.8 lb

## 2023-11-29 DIAGNOSIS — E538 Deficiency of other specified B group vitamins: Secondary | ICD-10-CM

## 2023-11-29 DIAGNOSIS — M109 Gout, unspecified: Secondary | ICD-10-CM

## 2023-11-29 LAB — CBC WITH DIFFERENTIAL/PLATELET
Basophils Absolute: 0 K/uL (ref 0.0–0.1)
Basophils Relative: 0.6 % (ref 0.0–3.0)
Eosinophils Absolute: 0 K/uL (ref 0.0–0.7)
Eosinophils Relative: 0.1 % (ref 0.0–5.0)
HCT: 39 % (ref 39.0–52.0)
Hemoglobin: 12.9 g/dL — ABNORMAL LOW (ref 13.0–17.0)
Lymphocytes Relative: 11.3 % — ABNORMAL LOW (ref 12.0–46.0)
Lymphs Abs: 0.8 K/uL (ref 0.7–4.0)
MCHC: 33.2 g/dL (ref 30.0–36.0)
MCV: 92.2 fl (ref 78.0–100.0)
Monocytes Absolute: 0.3 K/uL (ref 0.1–1.0)
Monocytes Relative: 4.1 % (ref 3.0–12.0)
Neutro Abs: 6.1 K/uL (ref 1.4–7.7)
Neutrophils Relative %: 83.9 % — ABNORMAL HIGH (ref 43.0–77.0)
Platelets: 302 K/uL (ref 150.0–400.0)
RBC: 4.23 Mil/uL (ref 4.22–5.81)
RDW: 14.2 % (ref 11.5–15.5)
WBC: 7.2 K/uL (ref 4.0–10.5)

## 2023-11-29 LAB — BASIC METABOLIC PANEL WITH GFR
BUN: 21 mg/dL (ref 6–23)
CO2: 26 meq/L (ref 19–32)
Calcium: 9.3 mg/dL (ref 8.4–10.5)
Chloride: 105 meq/L (ref 96–112)
Creatinine, Ser: 2.25 mg/dL — ABNORMAL HIGH (ref 0.40–1.50)
GFR: 29.03 mL/min — ABNORMAL LOW (ref >60.00)
Glucose, Bld: 160 mg/dL — ABNORMAL HIGH (ref 70–99)
Potassium: 4.5 meq/L (ref 3.5–5.1)
Sodium: 140 meq/L (ref 135–145)

## 2023-11-29 LAB — URIC ACID: Uric Acid, Serum: 5.8 mg/dL (ref 4.0–7.8)

## 2023-11-29 LAB — VITAMIN B12: Vitamin B-12: 720 pg/mL (ref 211–911)

## 2023-11-29 MED ORDER — PREDNISONE 10 MG PO TABS: ORAL_TABLET | ORAL | 0 refills | Status: DC

## 2023-11-29 NOTE — Patient Instructions (Signed)
 Go to the lab on the way out.   If you have mychart we'll likely use that to update you.    Take care.  Glad to see you.  If you are not getting better then let me know.   If you have a relapse then restart prednisone  and let me know.    Don't take ibuprofen  or aleve.

## 2023-11-29 NOTE — Progress Notes (Unsigned)
 Gout.  Has been on 200mg  allopurinol for about 2 months with higher frequent of gout flares in the last 2 months.  On prednisone  as of yesterday.  Some better today.  R foot pain.  He didn't have clear dietary trigger for recent flares.  He is limiting trigger foods.    R 2nd 3rd 4th MTP sore but not red now.    D/w pt about recheck CBC and B12.  Labs pending.  On B12 shots monthly.    Meds, vitals, and allergies reviewed.   ROS: Per HPI unless specifically indicated in ROS section

## 2023-12-01 ENCOUNTER — Ambulatory Visit: Payer: Self-pay | Admitting: Family Medicine

## 2023-12-01 DIAGNOSIS — G518 Other disorders of facial nerve: Secondary | ICD-10-CM | POA: Diagnosis not present

## 2023-12-01 DIAGNOSIS — M791 Myalgia, unspecified site: Secondary | ICD-10-CM | POA: Diagnosis not present

## 2023-12-01 DIAGNOSIS — M542 Cervicalgia: Secondary | ICD-10-CM | POA: Diagnosis not present

## 2023-12-01 DIAGNOSIS — G4486 Cervicogenic headache: Secondary | ICD-10-CM | POA: Diagnosis not present

## 2023-12-01 NOTE — Assessment & Plan Note (Signed)
 See notes on labs.  We may need to adjust his allopurinol dose.  Finish current prednisone  taper. If not getting better then let me know.   If he has a relapse then restart prednisone  and let me know.    Don't take ibuprofen  or aleve.  He agrees with plan.

## 2023-12-01 NOTE — Assessment & Plan Note (Signed)
 D/w pt about recheck CBC and B12.  Labs pending.  On B12 shots monthly.

## 2023-12-05 ENCOUNTER — Ambulatory Visit: Admitting: Family Medicine

## 2023-12-07 DIAGNOSIS — I10 Essential (primary) hypertension: Secondary | ICD-10-CM | POA: Diagnosis not present

## 2023-12-07 DIAGNOSIS — N1831 Chronic kidney disease, stage 3a: Secondary | ICD-10-CM | POA: Diagnosis not present

## 2023-12-07 DIAGNOSIS — M109 Gout, unspecified: Secondary | ICD-10-CM | POA: Diagnosis not present

## 2023-12-07 DIAGNOSIS — R809 Proteinuria, unspecified: Secondary | ICD-10-CM | POA: Diagnosis not present

## 2023-12-07 DIAGNOSIS — I2510 Atherosclerotic heart disease of native coronary artery without angina pectoris: Secondary | ICD-10-CM | POA: Diagnosis not present

## 2023-12-07 DIAGNOSIS — N051 Unspecified nephritic syndrome with focal and segmental glomerular lesions: Secondary | ICD-10-CM | POA: Diagnosis not present

## 2023-12-09 ENCOUNTER — Other Ambulatory Visit (HOSPITAL_COMMUNITY): Payer: Self-pay

## 2023-12-09 DIAGNOSIS — N051 Unspecified nephritic syndrome with focal and segmental glomerular lesions: Secondary | ICD-10-CM

## 2023-12-14 ENCOUNTER — Other Ambulatory Visit: Payer: Self-pay

## 2023-12-14 DIAGNOSIS — N1831 Chronic kidney disease, stage 3a: Secondary | ICD-10-CM

## 2023-12-15 ENCOUNTER — Other Ambulatory Visit: Payer: Self-pay | Admitting: Family Medicine

## 2023-12-15 MED ORDER — LEVOTHYROXINE SODIUM 100 MCG PO TABS: 100.0000 ug | ORAL_TABLET | Freq: Every day | ORAL | 3 refills | Status: DC

## 2023-12-16 ENCOUNTER — Ambulatory Visit: Admission: RE | Admit: 2023-12-16 | Discharge: 2023-12-16 | Disposition: A | Source: Ambulatory Visit

## 2023-12-16 DIAGNOSIS — N1831 Chronic kidney disease, stage 3a: Secondary | ICD-10-CM | POA: Diagnosis not present

## 2023-12-19 ENCOUNTER — Ambulatory Visit: Payer: Self-pay

## 2023-12-19 ENCOUNTER — Telehealth: Payer: Self-pay

## 2023-12-19 ENCOUNTER — Other Ambulatory Visit: Payer: Self-pay | Admitting: Family Medicine

## 2023-12-19 MED ORDER — LEVOTHYROXINE SODIUM 50 MCG PO TABS
100.0000 ug | ORAL_TABLET | Freq: Every day | ORAL | 1 refills | Status: AC
Start: 2023-12-19 — End: ?

## 2023-12-19 MED ORDER — PREDNISONE 10 MG PO TABS: ORAL_TABLET | ORAL | 1 refills | Status: AC

## 2023-12-19 NOTE — Telephone Encounter (Signed)
 Copied from CRM #[id number]. Topic: Clinical - Medication Refill >> Dec 19, 2023  2:38 PM Harlene ORN wrote: Medication: predniSONE  (DELTASONE ) 10 MG tablet  Has the patient contacted their pharmacy? Yes (Agent: If no, request that the patient contact the pharmacy for the refill. If patient does not wish to contact the pharmacy document the reason why and proceed with request.) (Agent: If yes, when and what did the pharmacy advise?)  This is the patient's preferred pharmacy:  [id number] [place], [place] - [address] [place] [address] Phone: [phone] Fax: [phone]  Is this the correct pharmacy for this prescription? Yes If no, delete pharmacy and type the correct one.   Has the prescription been filled recently? No  Is the patient out of the medication? Yes  Has the patient been seen for an appointment in the last year OR does the patient have an upcoming appointment? Yes  Can we respond through MyChart? Yes  Agent: Please be advised that Rx refills may take up to 3 business days. We ask that you follow-up with your pharmacy.

## 2023-12-19 NOTE — Telephone Encounter (Signed)
 Done, see following phone note.

## 2023-12-19 NOTE — Telephone Encounter (Signed)
 Spoke with pt relaying Dr Elfredia message about levothyroxine . Pt verbalizes understanding.   Also, pt states gout pain had resolved but returned last night (see today's Nurse Triage note). States Dr Cleatus had printed an additional prednisone  rx, for just in case. However, pt states he misplaced the written rx and is requesting new prednisone  rx be sent to [facility]

## 2023-12-19 NOTE — Progress Notes (Unsigned)
 286  Patient name: Tony Hunt MRN: [id number] DOB: [birth year] Sex: male  REASON FOR VISIT: 6 month follow-up for surveillance of abdominal aortic aneurysm and carotid surveillance  HPI: Tony Hunt is a 68 y.o. male with history of abdominal aortic aneurysm, laryngeal cancer status post radiation, coronary disease status post remote PCI, hypertension, hyperlipidemia and heart failure that presents for 40-month follow-up of his AAA.    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  He had a right TCAR procedure on 10/15/2019 for an asymptomatic high-grade stenosis greater than 80% in the setting of prior neck radiation.  He denies any neurologic events.  He remains on aspirin  statin Plavix .  No recent history of stroke or TIA.  Past Medical History:  Diagnosis Date   AAA (abdominal aortic aneurysm) without rupture 02/14/2014   US  1/19:  AAA 3 cm   ALCOHOL ABUSE, HX OF    distant history   Anxiety    occ panic sx, increased after death of mother   ANXIETY DEPRESSION 05/10/2007   history of, during difficult relationship   CAD (coronary artery disease) 08/28/2012   08/25/2012 Successful PTCA/DES x mid LAD // LAD prox 30, mid stent patent; D1 40; LCx ok; OM2 50, 50; RCA prox 20; EF 55-65  // Nuclear stress test 9/19: EF 58, small inferobasal infarct, no ischemia, Low Risk (inf defect ? 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 Darlean, pulmonary     Past Surgical History:  Procedure Laterality Date   APPENDECTOMY     BACK SURGERY  03/02/1995   lower back x3   CARPAL TUNNEL RELEASE Right 2022   CARPOMETACARPAL (CMC) FUSION OF THUMB Left 04/02/2014   Procedure: CARPOMETACARPAL (CMC) FUSION OF THUMB/LEFT THUMB INTERPHALNGEAL JOINT FUSION;  Surgeon: Alm DELENA Hummer, MD;  Location: [facility];  Service: Orthopedics;  Laterality: Left;   COLONOSCOPY     CORONARY STENT PLACEMENT     2.75 x 12 mm Promus Premier DES was deployed in the mid LAD. The stent was post-dilated with a 3.0 x 9 mm  balloon - Mid LAD.   LARYNGOSCOPY / BRONCHOSCOPY / ESOPHAGOSCOPY  03/01/2010   bx   LEFT HEART CATH AND CORONARY ANGIOGRAPHY N/A 05/28/2016   Procedure: Left Heart Cath and Coronary Angiography;  Surgeon: Victory ORN  Claudene, MD;  Location: [facility];  Service: Cardiovascular;  Laterality: N/A;   LEFT HEART CATHETERIZATION WITH CORONARY ANGIOGRAM N/A 08/25/2012   Procedure: LEFT HEART CATHETERIZATION WITH CORONARY ANGIOGRAM;  Surgeon: Lonni JONETTA Cash, MD;  Location: [facility] CATH LAB;  Service: Cardiovascular;  Laterality: N/A;   LEFT HEART CATHETERIZATION WITH CORONARY ANGIOGRAM N/A 11/23/2013   Procedure: LEFT HEART CATHETERIZATION WITH CORONARY ANGIOGRAM;  Surgeon: Candyce GORMAN Reek, MD;  Location: [facility] CATH LAB;  Service: Cardiovascular;  Laterality: N/A;   TRANSCAROTID ARTERY REVASCULARIZATION  Right 10/15/2019   Procedure: RIGHT  TRANSCAROTID ARTERY REVASCULARIZATION;  Surgeon: Gretta Lonni PARAS, MD;  Location: [facility] OR;  Service: Vascular;  Laterality: Right;   ULTRASOUND GUIDANCE FOR VASCULAR ACCESS Left 10/15/2019   Procedure: ULTRASOUND GUIDANCE FOR VASCULAR ACCESS;  Surgeon: Gretta Lonni PARAS, MD;  Location: [facility] OR;  Service: Vascular;  Laterality: Left;   WRIST FRACTURE SURGERY  08/21/2010   Right wrist x4    Family History  Problem Relation Age of Onset   Brain cancer Father    Dementia Father        h/o brain tumor   Heart disease Mother    Hypertension Mother    Heart disease Brother    Hypertension Brother    Hyperlipidemia Brother    Colon polyps Brother    Colon cancer Neg Hx    Prostate cancer Neg Hx    Esophageal cancer Neg Hx    Pancreatic cancer Neg Hx    Liver disease Neg Hx    Throat cancer Neg Hx     SOCIAL HISTORY: Social History   Tobacco Use   Smoking status: Former    Current packs/day: 0.00    Types: Cigarettes    Quit date: 09/30/2010    Years since quitting: 13.2   Smokeless tobacco: Former  Substance Use Topics   Alcohol use: Not Currently    Allergies  Allergen Reactions   Imdur  [Isosorbide  Dinitrate] Other (See Comments)    Headache at 60 mg dose, but able to tolerate a 30 mg per day   Iodine -131 Other (See Comments)    Passed out   Iohexol  Anaphylaxis and Other (See Comments)    OK with 13 hour prep   Lisinopril  Other (See Comments)    Elevated cr- improved off med   Pravastatin  Nausea Only and Other (See Comments)    Intense headache   Farxiga [Dapagliflozin]     Rash   Nsaids     CKD   Codeine Itching   Simvastatin  Other (See Comments)    Edema   Statins Other (See Comments)    Significant edema on simvastatin   SIGNIFICANT SWELLING    Current Outpatient Medications  Medication Sig Dispense Refill   albuterol  (VENTOLIN  HFA) 108 (90 Base) MCG/ACT inhaler Inhale 2 puffs into the lungs every 6 (six) hours as needed for wheezing or shortness of  breath. 8 g 0   allopurinol (ZYLOPRIM) 100 MG tablet Take 150 mg by mouth daily.     aspirin  EC 81 MG tablet Take 81 mg by mouth daily. Swallow whole.     clopidogrel  (PLAVIX ) 75 MG tablet TAKE 1 TABLET DAILY WITH BREAKFAST 90 tablet 3   cyanocobalamin  (VITAMIN B12) 1000 MCG/ML injection INJECT 1000MCG IM EVERY OTHER MONTH (IE EVERY 60 DAYS) 3 mL 1   fenofibrate  (TRICOR ) 48 MG tablet TAKE 1 TABLET BY MOUTH EVERY DAY 90 tablet 3   fluticasone (CUTIVATE) 0.05 % cream Apply topically.  furosemide  (LASIX ) 20 MG tablet Take 1 tablet (20 mg total) by mouth daily as needed (for lower extremity swelling). 30 tablet 6   gabapentin  (NEURONTIN ) 300 MG capsule Take 2 capsules (600 mg total) by mouth at bedtime.     isosorbide  mononitrate (IMDUR ) 30 MG 24 hr tablet Take 0.5 tablets (15 mg total) by mouth daily. 45 tablet 0   ketoconazole (NIZORAL) 2 % cream Apply 1 Application topically 2 (two) times daily.     levocetirizine (XYZAL ) 5 MG tablet Take 1 tablet (5 mg total) by mouth every evening. 90 tablet 3   levothyroxine  (SYNTHROID ) 50 MCG tablet Take 2 tablets (100 mcg total) by mouth daily before breakfast. 180 tablet 1   losartan (COZAAR) 25 MG tablet Take 12.5 mg by mouth daily.     melatonin 3 MG TABS tablet Take 3 mg by mouth at bedtime as needed (for sleep).      metroNIDAZOLE  (METROCREAM ) 0.75 % cream Apply 1 Application topically 2 (two) times daily.     nitroGLYCERIN  (NITROSTAT ) 0.4 MG SL tablet Place 1 tablet (0.4 mg total) under the tongue every 5 (five) minutes as needed for chest pain (do not exceed 3 pills during one episode). 25 tablet 6   pantoprazole  (PROTONIX ) 40 MG tablet Take 1 tablet (40 mg total) by mouth daily. 90 tablet 3   polyethylene glycol powder (GLYCOLAX /MIRALAX ) 17 GM/SCOOP powder Take 17 g by mouth 2 (two) times daily as needed.     potassium chloride  (KLOR-CON  M) 10 MEQ tablet Take 1 tablet (10 mEq total) by mouth daily as needed (take with daily as needed lasix .). 30  tablet 6   predniSONE  (DELTASONE ) 10 MG tablet Take 2 a day for 5 days, then 1 a day for 5 days, with food. Don't take with aleve/ibuprofen . 15 tablet 0   REPATHA  SURECLICK 140 MG/ML SOAJ INJECT 1 PEN INTO THE SKIN EVERY 14 DAYS 6 mL 3   rosuvastatin  (CRESTOR ) 20 MG tablet TAKE 1 TABLET DAILY 90 tablet 3   SYRINGE-NEEDLE, DISP, 3 ML 25G X 1 3 ML MISC Patient is to use to self inject 1 ml of vitamin B12 per provider instructions. 3 each 1   tamsulosin  (FLOMAX ) 0.4 MG CAPS capsule TAKE 1 CAPSULE DAILY 90 capsule 3   No current facility-administered medications for this visit.    REVIEW OF SYSTEMS:  [X]  denotes positive finding, [ ]  denotes negative finding Cardiac  Comments:  Chest pain or chest pressure:    Shortness of breath upon exertion:    Short of breath when lying flat:    Irregular heart rhythm:        Vascular    Pain in calf, thigh, or hip brought on by ambulation:    Pain in feet at night that wakes you up from your sleep:     Blood clot in your veins:    Leg swelling:         Pulmonary    Oxygen at home:    Productive cough:     Wheezing:     Shortness of Breath    Neurologic    Sudden weakness in arms or legs:     Sudden numbness in arms or legs:     Sudden onset of difficulty speaking or slurred speech:    Temporary loss of vision in one eye:     Problems with dizziness:         Gastrointestinal    Blood in stool:  Vomited blood:         Genitourinary    Burning when urinating:     Blood in urine:        Psychiatric    Major depression:         Hematologic    Bleeding problems:    Problems with blood clotting too easily:        Skin    Rashes or ulcers:        Constitutional    Fever or chills:      PHYSICAL EXAM: There were no vitals filed for this visit.   GENERAL: The patient is a well-nourished male, in no acute distress. The vital signs are documented above. CARDIAC: There is a regular rate and rhythm.  VASCULAR:  Right neck  incision healed without issue Bilateral DP pulses palpable PULMONARY: No respiratory distress. ABDOMEN: Soft and non-tender.  No pain with palpation of aneurysm.  MUSCULOSKELETAL: There are no major deformities or cyanosis. NEUROLOGIC: No focal weakness or paresthesias are detected.  CN II-XII grossly intact.   DATA:   AAA duplex todayu shows AAA i 4.1 cm over past 6 months, stable 2.8 cm left CIA aneurysm    Carotid duplex today  (12/14/22 shows patent right carotid stent with no stenosis and 40-59% left ICA stenosis)    Assessment/Plan:  68 year old male presents for 6 month follow-up of his known 4.1 cm AAA and surveillance carotid artery disease.    AAA duplex today shows his abdominal aortic aneurysm stable at 4.1 cm over the past 6 months.  I discussed this is not a significant change.  I discussed current guidelines are to repair these at greater than 5.5 cm in men unless there is rapid growth and no indication for repair at this time.  He does have a stable 2.8 cm left common iliac aneurysm.  I will see him in 6 months with AAA duplex.  He is status post right TCAR on 10/15/2019 for an asymptomatic high-grade stenosis greater than 80% in the setting of previous neck radiation.      Lonni DOROTHA Gaskins, MD [facility] Office: [phone]

## 2023-12-19 NOTE — Telephone Encounter (Signed)
 It looks likely 100mg  levothyroxine  is unavailable but 50mg  is.    We can send rx for 2 of the 50mg  tabs per day.  Make sure patient is aware.    I sent the rx in the meantime.  Thanks.

## 2023-12-19 NOTE — Telephone Encounter (Signed)
 Called pt.  Similar to prev gout flare.  He had improved with prev pred taper and didn't have sx until flare started in the lats 24 hours.  Rx for pred resent with routine cautions.  He can update me as needed.  He thanked me for the call.

## 2023-12-19 NOTE — Telephone Encounter (Addendum)
 FYI Only or Action Required?: Action required by provider: update on patient condition.  Patient was last seen in primary care on 11/29/2023 by Cleatus Arlyss RAMAN, MD.  Called Nurse Triage reporting Foot Pain.  Symptoms began yesterday.  Interventions attempted: Prescription medications: Prednisone .  Symptoms are: gradually worsening.  Triage Disposition: See HCP Within 4 Hours (Or PCP Triage)  Patient/caregiver understands and will follow disposition?: Yes                              Copied from CRM #[id number]. Topic: Clinical - Red Word Triage >> Dec 19, 2023  2:35 PM Harlene ORN wrote: Red Word that prompted transfer to Nurse Triage: gout flare up in foot. (Requesting prescription for prednisone ) Reason for Disposition  [1] SEVERE pain (e.g., excruciating, unable to do any normal activities) AND [2] not improved after 2 hours of pain medicine  Answer Assessment - Initial Assessment Questions 1. ONSET: When did the pain start?      Gout pain x 1 day, Right foot   2. LOCATION: Where is the pain located?      Right foot   3. PAIN: How bad is the pain?    (Scale 1-10; or mild, moderate, severe)     8/10  4. WORK OR EXERCISE: Has there been any recent work or exercise that involved this part of the body?      No   5. CAUSE: What do you think is causing the foot pain?     Gout flare-up   6. OTHER SYMPTOMS: Do you have any other symptoms? (e.g., leg pain, rash, fever, numbness)   Prednisone  refill request sent on 10/20. Was advised by PCP to let him know if gout was worsening. Pt. Declined an appt. And just wishes to update the provider that the gout is worsening, even with the current medications.  Protocols used: Foot Pain-A-AH

## 2023-12-20 ENCOUNTER — Ambulatory Visit (HOSPITAL_BASED_OUTPATIENT_CLINIC_OR_DEPARTMENT_OTHER)
Admission: RE | Admit: 2023-12-20 | Discharge: 2023-12-20 | Disposition: A | Discharge status: Home / Self Care | Source: Ambulatory Visit | Attending: Vascular Surgery | Admitting: Vascular Surgery

## 2023-12-20 ENCOUNTER — Ambulatory Visit: Admitting: Vascular Surgery

## 2023-12-20 ENCOUNTER — Encounter: Payer: Self-pay | Admitting: Vascular Surgery

## 2023-12-20 ENCOUNTER — Ambulatory Visit (HOSPITAL_COMMUNITY)
Admission: RE | Admit: 2023-12-20 | Discharge: 2023-12-20 | Disposition: A | Discharge status: Home / Self Care | Source: Ambulatory Visit | Attending: Vascular Surgery | Admitting: Vascular Surgery

## 2023-12-20 VITALS — BP 150/84 | HR 60 | Temp 98.0°F | Resp 18 | Ht 66.3 in | Wt 193.7 lb

## 2023-12-20 DIAGNOSIS — I7143 Infrarenal abdominal aortic aneurysm, without rupture: Secondary | ICD-10-CM | POA: Insufficient documentation

## 2023-12-20 DIAGNOSIS — I779 Disorder of arteries and arterioles, unspecified: Secondary | ICD-10-CM | POA: Insufficient documentation

## 2023-12-26 DIAGNOSIS — G518 Other disorders of facial nerve: Secondary | ICD-10-CM | POA: Diagnosis not present

## 2023-12-26 DIAGNOSIS — G4486 Cervicogenic headache: Secondary | ICD-10-CM | POA: Diagnosis not present

## 2023-12-26 DIAGNOSIS — M791 Myalgia, unspecified site: Secondary | ICD-10-CM | POA: Diagnosis not present

## 2023-12-26 DIAGNOSIS — M542 Cervicalgia: Secondary | ICD-10-CM | POA: Diagnosis not present

## 2024-01-02 DIAGNOSIS — M109 Gout, unspecified: Secondary | ICD-10-CM | POA: Diagnosis not present

## 2024-01-02 DIAGNOSIS — I2510 Atherosclerotic heart disease of native coronary artery without angina pectoris: Secondary | ICD-10-CM | POA: Diagnosis not present

## 2024-01-02 DIAGNOSIS — I10 Essential (primary) hypertension: Secondary | ICD-10-CM | POA: Diagnosis not present

## 2024-01-02 DIAGNOSIS — N051 Unspecified nephritic syndrome with focal and segmental glomerular lesions: Secondary | ICD-10-CM | POA: Diagnosis not present

## 2024-01-02 DIAGNOSIS — R809 Proteinuria, unspecified: Secondary | ICD-10-CM | POA: Diagnosis not present

## 2024-01-02 DIAGNOSIS — N1832 Chronic kidney disease, stage 3b: Secondary | ICD-10-CM | POA: Diagnosis not present

## 2024-01-02 LAB — LAB REPORT - SCANNED
Albumin, Urine POC: 917
Microalb Creat Ratio: 1092

## 2024-01-05 DIAGNOSIS — M47812 Spondylosis without myelopathy or radiculopathy, cervical region: Secondary | ICD-10-CM | POA: Diagnosis not present

## 2024-01-19 DIAGNOSIS — M47812 Spondylosis without myelopathy or radiculopathy, cervical region: Secondary | ICD-10-CM | POA: Diagnosis not present

## 2024-01-19 DIAGNOSIS — M542 Cervicalgia: Secondary | ICD-10-CM | POA: Diagnosis not present

## 2024-01-30 ENCOUNTER — Encounter (HOSPITAL_COMMUNITY): Payer: Self-pay

## 2024-01-30 ENCOUNTER — Ambulatory Visit (HOSPITAL_COMMUNITY)

## 2024-01-30 DIAGNOSIS — G518 Other disorders of facial nerve: Secondary | ICD-10-CM | POA: Diagnosis not present

## 2024-01-30 DIAGNOSIS — R519 Headache, unspecified: Secondary | ICD-10-CM | POA: Diagnosis not present

## 2024-01-30 DIAGNOSIS — M791 Myalgia, unspecified site: Secondary | ICD-10-CM | POA: Diagnosis not present

## 2024-01-30 DIAGNOSIS — M542 Cervicalgia: Secondary | ICD-10-CM | POA: Diagnosis not present

## 2024-02-08 DIAGNOSIS — D485 Neoplasm of uncertain behavior of skin: Secondary | ICD-10-CM | POA: Diagnosis not present

## 2024-02-08 DIAGNOSIS — L814 Other melanin hyperpigmentation: Secondary | ICD-10-CM | POA: Diagnosis not present

## 2024-02-08 DIAGNOSIS — L821 Other seborrheic keratosis: Secondary | ICD-10-CM | POA: Diagnosis not present

## 2024-02-08 DIAGNOSIS — D1801 Hemangioma of skin and subcutaneous tissue: Secondary | ICD-10-CM | POA: Diagnosis not present

## 2024-02-08 DIAGNOSIS — R208 Other disturbances of skin sensation: Secondary | ICD-10-CM | POA: Diagnosis not present

## 2024-02-08 DIAGNOSIS — Z85828 Personal history of other malignant neoplasm of skin: Secondary | ICD-10-CM | POA: Diagnosis not present

## 2024-02-08 DIAGNOSIS — L820 Inflamed seborrheic keratosis: Secondary | ICD-10-CM | POA: Diagnosis not present

## 2024-02-08 DIAGNOSIS — L538 Other specified erythematous conditions: Secondary | ICD-10-CM | POA: Diagnosis not present

## 2024-02-08 DIAGNOSIS — Z08 Encounter for follow-up examination after completed treatment for malignant neoplasm: Secondary | ICD-10-CM | POA: Diagnosis not present

## 2024-02-08 DIAGNOSIS — L570 Actinic keratosis: Secondary | ICD-10-CM | POA: Diagnosis not present

## 2024-02-15 ENCOUNTER — Other Ambulatory Visit: Payer: Self-pay | Admitting: Family Medicine

## 2024-02-15 DIAGNOSIS — E538 Deficiency of other specified B group vitamins: Secondary | ICD-10-CM

## 2024-02-17 ENCOUNTER — Ambulatory Visit

## 2024-02-24 ENCOUNTER — Ambulatory Visit

## 2024-02-24 ENCOUNTER — Telehealth (HOSPITAL_COMMUNITY): Payer: Self-pay | Admitting: *Deleted

## 2024-02-24 VITALS — BP 120/78 | HR 73 | Ht 67.0 in | Wt 196.0 lb

## 2024-02-24 DIAGNOSIS — I7143 Infrarenal abdominal aortic aneurysm, without rupture: Secondary | ICD-10-CM | POA: Insufficient documentation

## 2024-02-24 DIAGNOSIS — E782 Mixed hyperlipidemia: Secondary | ICD-10-CM | POA: Insufficient documentation

## 2024-02-24 DIAGNOSIS — I2510 Atherosclerotic heart disease of native coronary artery without angina pectoris: Secondary | ICD-10-CM | POA: Insufficient documentation

## 2024-02-24 DIAGNOSIS — I779 Disorder of arteries and arterioles, unspecified: Secondary | ICD-10-CM | POA: Insufficient documentation

## 2024-02-24 DIAGNOSIS — I10 Essential (primary) hypertension: Secondary | ICD-10-CM | POA: Diagnosis present

## 2024-02-24 NOTE — Telephone Encounter (Signed)
 Patient given detailed instructions per Myocardial Perfusion Study Information Sheet for the test on 03/05/2024 at 7:30. Patient notified to arrive 15 minutes early and that it is imperative to arrive on time for appointment to keep from having the test rescheduled.  If you need to cancel or reschedule your appointment, please call the office within 24 hours of your appointment. . Patient verbalized understanding.Tony Hunt

## 2024-02-24 NOTE — Progress Notes (Signed)
"   °  °  Cardiology Office Note Date:  02/24/2024  ID:  Tony, Hunt 1955/08/15, MRN [id number] PCP:  Cleatus Arlyss RAMAN, MD  Cardiologist:   Joelle VEAR Ren Donley, MD  Chief Complaint  Patient presents with   Coronary Artery Disease     Problems CAD s/p mid LAD 07/2012 TTE 7/25: 65-70%, mid LVH, G1DD, abdominal A 4.2 cm 09/2021: Lexi scan with low risk 04/2016: Patent stent, 40% prox-D1, OM1 50% AAA U/S 8/24 4 cm (from 3.6) Carotid disease s/p R carotid stent 8/21- follows with vascular surgery CKD- follows with Nephrology M: ASA81, CL75, FE48, FE20 as needed, LN12.5, RN20, Eb weeky L: LDL 80 6/25  Visits  12/25: HA1C, LP, apoB, NST, 1 year follow    Discussed the use of AI scribe software for clinical note transcription with the patient, who gave verbal consent to proceed.  History of Present Illness   Tony Hunt is a 68 year old male with coronary artery disease and stent placement who presents for a cardiovascular follow-up and DOT physical requirements. He reports no chest pain and only occasional mild shortness of breath since his last visit. He walks one to two miles three to four times weekly without limitation and has had no significant weight change. He quit smoking fifteen years ago after a 25-year history of one to two packs per day. He checks blood pressure at home about three times weekly with typical readings of 120-125/80 mmHg. 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 He previously required a pharmacologic stress test because he had marked difficulty with treadmill testing. He takes aspirin  and Plavix  for his stent and has noticed easy bruising.      ROS: Please see the history of present illness. All other systems are reviewed and negative.    PHYSICAL EXAM: VS:  BP 120/78 (BP Location: Right Arm, Patient Position: Sitting, Cuff Size: Normal)   Pulse 73   Ht 5' 7 (1.702 m)   Wt 196 lb (88.9 kg)   SpO2 94%    BMI 30.70 kg/m  , BMI Body mass index is 30.7 kg/m. GEN: Well nourished, well developed, in no acute distress HEENT: normal Neck: no JVD, carotid bruits, or masses Cardiac: RRR; no murmurs, rubs, or gallops,no edema  Respiratory:  CTAB bilaterally, normal work of breathing GI: soft, nontender, nondistended, + BS Extremities: No LE edema Skin: warm and dry, no rash Neuro:  Strength and sensation are intact  Recent Labs: Reviewed  Studies: Reviewed  ASSESSMENT AND PLAN: [name] is a 68 y.o. male who presents for follow up    Coronary artery disease with prior stent placement No current chest pain or significant shortness of breath. Bruising likely due to dual antiplatelet therapy. - Ordered stress test for DOT physical requirements. - Discontinued aspirin  to reduce bruising. - Continue Plavix .  Essential hypertension Well-controlled with home blood pressure readings around 120-125/80 mmHg.  Mixed hyperlipidemia Cholesterol levels well-controlled. - Ordered blood work to assess cholesterol levels.       Signed, Joelle VEAR Ren Donley, MD  02/24/2024 8:15 AM    [facility] "

## 2024-02-24 NOTE — Patient Instructions (Addendum)
 Medication Instructions:  Stop Aspirin  *If you need a refill on your cardiac medications before your next appointment, please call your pharmacy*  Lab Work: Lipid panel, Apolipoprotein B today at [facility] If you have labs (blood work) drawn today and your tests are completely normal, you will receive your results only by: MyChart Message (if you have MyChart) OR A paper copy in the mail If you have any lab test that is abnormal or we need to change your treatment, we will call you to review the results.  Testing/Procedures: Lexiscan  Myoview  Stress Test  Follow-Up: At [facility], you and your health needs are our priority.  As part of our continuing mission to provide you with exceptional heart care, our providers are all part of one team.  This team includes your primary Cardiologist (physician) and Advanced Practice Providers or APPs (Physician Assistants and Nurse Practitioners) who all work together to provide you with the care you need, when you need it.  Your next appointment:   1 year(s)  Provider:   Ren, MD  We recommend signing up for the patient portal called MyChart.  Sign up information is provided on this After Visit Summary.  MyChart is used to connect with patients for Virtual Visits (Telemedicine).  Patients are able to view lab/test results, encounter notes, upcoming appointments, etc.  Non-urgent messages can be sent to your provider as well.   To learn more about what you can do with MyChart, go to [email or web address].

## 2024-02-25 LAB — APOLIPOPROTEIN B: Apolipoprotein B: 87 mg/dL

## 2024-02-25 LAB — LIPID PANEL
Chol/HDL Ratio: 4.8 ratio (ref 0.0–5.0)
Cholesterol, Total: 174 mg/dL (ref 100–199)
HDL: 36 mg/dL — ABNORMAL LOW
LDL Chol Calc (NIH): 92 mg/dL (ref 0–99)
Triglycerides: 271 mg/dL — ABNORMAL HIGH (ref 0–149)
VLDL Cholesterol Cal: 46 mg/dL — ABNORMAL HIGH (ref 5–40)

## 2024-02-28 ENCOUNTER — Ambulatory Visit: Payer: Self-pay

## 2024-02-28 DIAGNOSIS — I2510 Atherosclerotic heart disease of native coronary artery without angina pectoris: Secondary | ICD-10-CM

## 2024-02-28 MED ORDER — EZETIMIBE 10 MG PO TABS: 10.0000 mg | ORAL_TABLET | Freq: Every day | ORAL | 3 refills | Status: AC

## 2024-02-29 ENCOUNTER — Other Ambulatory Visit: Payer: Self-pay

## 2024-02-29 DIAGNOSIS — I2510 Atherosclerotic heart disease of native coronary artery without angina pectoris: Secondary | ICD-10-CM

## 2024-03-05 ENCOUNTER — Ambulatory Visit (HOSPITAL_COMMUNITY)
Admission: RE | Admit: 2024-03-05 | Discharge: 2024-03-05 | Disposition: A | Discharge status: Home / Self Care | Source: Ambulatory Visit | Attending: Internal Medicine | Admitting: Internal Medicine

## 2024-03-05 DIAGNOSIS — I2510 Atherosclerotic heart disease of native coronary artery without angina pectoris: Secondary | ICD-10-CM | POA: Insufficient documentation

## 2024-03-05 LAB — MYOCARDIAL PERFUSION IMAGING
LV dias vol: 106 mL (ref 62–150)
LV sys vol: 43 mL
Nuc Stress EF: 59 %
Peak HR: 103 {beats}/min
Rest HR: 72 {beats}/min
Rest Nuclear Isotope Dose: 10.8 mCi
SDS: 0
SRS: 1
SSS: 0
ST Depression (mm): 0 mm
Stress Nuclear Isotope Dose: 32.8 mCi
TID: 1.1

## 2024-03-05 MED ORDER — REGADENOSON 0.4 MG/5ML IV SOLN
INTRAVENOUS | Status: AC
  Filled 2024-03-05: qty 5

## 2024-03-05 MED ORDER — TECHNETIUM TC 99M TETROFOSMIN IV KIT
10.8000 | PACK | Freq: Once | INTRAVENOUS | Status: AC | PRN
  Administered 2024-03-05: 10.8 via INTRAVENOUS

## 2024-03-05 MED ORDER — TECHNETIUM TC 99M TETROFOSMIN IV KIT
32.8000 | PACK | Freq: Once | INTRAVENOUS | Status: AC | PRN
  Administered 2024-03-05: 32.8 via INTRAVENOUS

## 2024-03-05 MED ORDER — REGADENOSON 0.4 MG/5ML IV SOLN
0.4000 mg | Freq: Once | INTRAVENOUS | Status: AC
  Administered 2024-03-05: 0.4 mg via INTRAVENOUS

## 2024-03-30 ENCOUNTER — Ambulatory Visit (INDEPENDENT_AMBULATORY_CARE_PROVIDER_SITE_OTHER)

## 2024-03-30 DIAGNOSIS — L600 Ingrowing nail: Secondary | ICD-10-CM

## 2024-03-30 NOTE — Progress Notes (Signed)
 "  Subjective:  Patient ID: Tony Hunt, male    DOB: May 24, 1955,  MRN: [id number]  Chief Complaint  Patient presents with   Ingrown Toenail    Rm5 Patient complains of ingrown toenails bilateral big toes for several months/red and painful to touch.    69 y.o. male presents with the above complaint.  He complains of pain related to ingrown nails at bilateral borders of bilateral big toes.  He states that he has had problems with ingrown nails before.  He had a partial nail avulsion as a kid.  He denies any purulence or signs of active infection. He denies diabetes. He is not a smoker.    Review of Systems: Negative except as noted in the HPI. Denies N/V/F/Ch.  Past Medical History:  Diagnosis Date   AAA (abdominal aortic aneurysm) without rupture 02/14/2014   US  1/19:  AAA 3 cm   ALCOHOL ABUSE, HX OF    distant history   Anxiety    occ panic sx, increased after death of mother   ANXIETY DEPRESSION 05/10/2007   history of, during difficult relationship   CAD (coronary artery disease) 08/28/2012   08/25/2012 Successful PTCA/DES x mid LAD // LAD prox 30, mid stent patent; D1 40; LCx ok; OM2 50, 50; RCA prox 20; EF 55-65  // Nuclear stress test 9/19: EF 58, small inferobasal infarct, no ischemia, Low Risk (inf defect ? 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 Darlean, pulmonary    Current Medications[1]  Tobacco Use History[2]  Allergies[3] Objective:  There were no vitals filed for this visit. There is no height or weight on file to calculate BMI. Constitutional Well developed. Well nourished.  Vascular Dorsalis pedis pulses palpable bilaterally. Posterior tibial pulses palpable bilaterally. Capillary refill normal to all digits.  No cyanosis or clubbing noted. Pedal hair growth normal.  Neurologic Normal speech. Oriented to person, place, and time. Epicritic sensation to light touch grossly present bilaterally.  Dermatologic Painful ingrowing nail at bilateral nail borders of the hallux nail bilaterally.  No purulence or active signs of infection noted. No other open wounds. No skin lesions.  Orthopedic: Normal joint ROM without pain or crepitus bilaterally. No visible deformities. No bony tenderness.   Radiographs: None Assessment:   1. Ingrown toenail of left foot   2. Ingrown toenail of right foot  Plan:  Patient was evaluated and treated and all questions answered.  Ingrown Nail, bilaterally -Discussed treatment options ranging from surgical to conservative along with risks and benefits of each. Patient expresses understanding. Patient elects to proceed with minor surgery to remove ingrown toenail removal today. Consent reviewed and signed by patient. -Ingrown nail excised. See procedure note. -Educated on post-procedure care including soaking. Written instructions provided and reviewed. -Patient to follow up in 2 weeks for nail  check.  Procedure: Excision of Ingrown Toenail Location: Bilateral 1st toe bilateral nail borders. Anesthesia: Lidocaine  1% plain; 1.5 mL and Marcaine  0.5% plain; 1.5 mL, digital block. Skin Prep: Betadine. Dressing: Silvadene; telfa; dry, sterile, compression dressing. Technique: Following skin prep, the toe was exsanguinated and a tourniquet was secured at the base of the toe. The affected nail border was freed, split with a nail splitter, and excised. Chemical matrixectomy was then performed with phenol and irrigated out with alcohol. The tourniquet was then removed and sterile dressing applied. Disposition: Patient tolerated procedure well. Patient to return in 2 weeks for follow-up.  Post op care: Keep dressing clean, dry, and intact for 24 hours before removing. Soak operative foot and redress BID as printed instructions describe. Patient educated on signs of infection, pt expresses understanding and will proceed for prompt medical care if signs of infection arise.   No follow-ups on file.  Prentice Ovens, DPM AACFAS Fellowship Trained Podiatric Surgeon [facility]     [1]  Current Outpatient Medications:    allopurinol (ZYLOPRIM) 100 MG tablet, Take 150 mg by mouth daily., Disp: , Rfl:    clopidogrel  (PLAVIX ) 75 MG tablet, TAKE 1 TABLET DAILY WITH BREAKFAST, Disp: 90 tablet, Rfl: 3   cyanocobalamin  (VITAMIN B12) 1000 MCG/ML injection, INJECT 1000MCG INTO THE MUSCLE EVERY OTHER MONTH (IE EVERY 60 DAYS), Disp: 3 mL, Rfl: 1   ezetimibe  (ZETIA ) 10 MG tablet, Take 1 tablet (10 mg total) by mouth daily., Disp: 90 tablet, Rfl: 3   fenofibrate  (TRICOR ) 48 MG tablet, TAKE 1 TABLET BY MOUTH EVERY DAY, Disp: 90 tablet, Rfl: 3   fluticasone (CUTIVATE) 0.05 % cream, Apply topically., Disp: , Rfl:    furosemide  (LASIX ) 20 MG tablet, Take 1 tablet (20 mg total) by mouth daily as needed (for lower extremity swelling)., Disp: 30 tablet, Rfl: 6   gabapentin  (NEURONTIN ) 300 MG capsule,  Take 2 capsules (600 mg total) by mouth at bedtime., Disp: , Rfl:    isosorbide  mononitrate (IMDUR ) 30 MG 24 hr tablet, Take 0.5 tablets (15 mg total) by mouth daily., Disp: 45 tablet, Rfl: 0   ketoconazole (NIZORAL) 2 % cream, Apply 1 Application topically 2 (two) times daily., Disp: , Rfl:    levocetirizine (XYZAL ) 5 MG tablet, Take 1 tablet (5 mg total) by mouth every evening., Disp: 90 tablet, Rfl: 3   levothyroxine  (SYNTHROID ) 50 MCG tablet, Take 2 tablets (100 mcg total) by mouth daily before breakfast., Disp: 180 tablet, Rfl: 1   losartan (COZAAR) 25 MG tablet, Take 12.5 mg by mouth daily., Disp: , Rfl:    melatonin 3 MG TABS tablet, Take 3 mg by mouth at bedtime as needed (for sleep). , Disp: , Rfl:    metroNIDAZOLE  (METROCREAM ) 0.75 % cream, Apply 1 Application topically 2 (two) times daily., Disp: , Rfl:    nitroGLYCERIN  (NITROSTAT ) 0.4 MG SL tablet, Place 1 tablet (0.4 mg total) under the tongue every 5 (five) minutes as needed for chest pain (do not exceed 3 pills during  one episode)., Disp: 25 tablet, Rfl: 6   pantoprazole  (PROTONIX ) 40 MG tablet, Take 1 tablet (40 mg total) by mouth daily., Disp: 90 tablet, Rfl: 3   polyethylene glycol powder (GLYCOLAX /MIRALAX ) 17 GM/SCOOP powder, Take 17 g by mouth 2 (two) times daily as needed., Disp: , Rfl:    potassium chloride  (KLOR-CON  M) 10 MEQ tablet, Take 1 tablet (10 mEq total) by mouth daily as needed (take with daily as needed lasix .)., Disp: 30 tablet, Rfl: 6   predniSONE  (DELTASONE ) 10 MG tablet, Take 2 a day for 5 days, then 1 a day for 5 days, with food. Don't take with aleve/ibuprofen . For gout., Disp: 15 tablet, Rfl: 1   REPATHA  SURECLICK 140 MG/ML SOAJ, INJECT 1 PEN INTO THE SKIN EVERY 14 DAYS, Disp: 6 mL, Rfl: 3   rosuvastatin  (CRESTOR ) 20 MG tablet, TAKE 1 TABLET DAILY, Disp: 90 tablet, Rfl: 3   SYRINGE-NEEDLE, DISP, 3 ML 25G X 1 3 ML MISC, Patient is to use to self inject 1 ml of vitamin B12 per provider instructions., Disp: 3  each, Rfl: 1   tamsulosin  (FLOMAX ) 0.4 MG CAPS capsule, TAKE 1 CAPSULE DAILY, Disp: 90 capsule, Rfl: 3   albuterol  (VENTOLIN  HFA) 108 (90 Base) MCG/ACT inhaler, Inhale 2 puffs into the lungs every 6 (six) hours as needed for wheezing or shortness of breath., Disp: 8 g, Rfl: 0 [2]  Social History Tobacco Use  Smoking Status Former   Current packs/day: 0.00   Types: Cigarettes   Quit date: 09/30/2010   Years since quitting: 13.5  Smokeless Tobacco Former  [3]  Allergies Allergen Reactions   Imdur  [Isosorbide  Dinitrate] Other (See Comments)    Headache at 60 mg dose, but able to tolerate a 30 mg per day   Iodine -131 Other (See Comments)    Passed out   Iohexol  Anaphylaxis and Other (See Comments)    OK with 13 hour prep   Lisinopril  Other (See Comments)    Elevated cr- improved off med   Pravastatin  Nausea Only and Other (See Comments)    Intense headache   Farxiga [Dapagliflozin]     Rash   Nsaids     CKD   Codeine Itching   Simvastatin  Other (See Comments)    Edema   Statins Other (See Comments)    Significant edema on simvastatin   SIGNIFICANT SWELLING   "

## 2024-03-30 NOTE — Patient Instructions (Signed)

## 2024-08-21 ENCOUNTER — Ambulatory Visit
